# Patient Record
Sex: Female | Born: 1960
Health system: Southern US, Community
[De-identification: ages and names within clinical notes are randomized; demographics above are authoritative.]

## PROBLEM LIST (undated history)

## (undated) DIAGNOSIS — E876 Hypokalemia: Secondary | ICD-10-CM

## (undated) DIAGNOSIS — Z87448 Personal history of other diseases of urinary system: Secondary | ICD-10-CM

## (undated) DIAGNOSIS — I10 Essential (primary) hypertension: Secondary | ICD-10-CM

## (undated) DIAGNOSIS — C801 Malignant (primary) neoplasm, unspecified: Secondary | ICD-10-CM

## (undated) DIAGNOSIS — Z923 Personal history of irradiation: Secondary | ICD-10-CM

## (undated) DIAGNOSIS — R05 Cough: Secondary | ICD-10-CM

## (undated) DIAGNOSIS — K5730 Diverticulosis of large intestine without perforation or abscess without bleeding: Secondary | ICD-10-CM

## (undated) DIAGNOSIS — E119 Type 2 diabetes mellitus without complications: Secondary | ICD-10-CM

## (undated) DIAGNOSIS — E210 Primary hyperparathyroidism: Secondary | ICD-10-CM

## (undated) DIAGNOSIS — D259 Leiomyoma of uterus, unspecified: Secondary | ICD-10-CM

## (undated) DIAGNOSIS — K219 Gastro-esophageal reflux disease without esophagitis: Secondary | ICD-10-CM

## (undated) DIAGNOSIS — N183 Chronic kidney disease, stage 3 unspecified: Secondary | ICD-10-CM

## (undated) DIAGNOSIS — R1110 Vomiting, unspecified: Secondary | ICD-10-CM

## (undated) DIAGNOSIS — K429 Umbilical hernia without obstruction or gangrene: Secondary | ICD-10-CM

## (undated) DIAGNOSIS — R011 Cardiac murmur, unspecified: Secondary | ICD-10-CM

## (undated) DIAGNOSIS — D849 Immunodeficiency, unspecified: Secondary | ICD-10-CM

## (undated) DIAGNOSIS — Z9221 Personal history of antineoplastic chemotherapy: Secondary | ICD-10-CM

## (undated) DIAGNOSIS — Z940 Kidney transplant status: Secondary | ICD-10-CM

## (undated) DIAGNOSIS — N189 Chronic kidney disease, unspecified: Secondary | ICD-10-CM

## (undated) DIAGNOSIS — D219 Benign neoplasm of connective and other soft tissue, unspecified: Secondary | ICD-10-CM

## (undated) DIAGNOSIS — D649 Anemia, unspecified: Secondary | ICD-10-CM

## (undated) DIAGNOSIS — R6883 Chills (without fever): Secondary | ICD-10-CM

## (undated) DIAGNOSIS — C50919 Malignant neoplasm of unspecified site of unspecified female breast: Secondary | ICD-10-CM

## (undated) HISTORY — PX: TUBAL LIGATION: SHX77

## (undated) HISTORY — PX: BREAST BIOPSY: SHX20

## (undated) HISTORY — DX: Umbilical hernia without obstruction or gangrene: K42.9

## (undated) HISTORY — PX: COLONOSCOPY: SHX174

## (undated) HISTORY — PX: BREAST LUMPECTOMY: SHX2

## (undated) HISTORY — DX: Vomiting, unspecified: R11.10

## (undated) HISTORY — DX: Essential (primary) hypertension: I10

## (undated) HISTORY — DX: Personal history of irradiation: Z92.3

## (undated) HISTORY — DX: Cough: R05

## (undated) HISTORY — PX: DILATION AND CURETTAGE OF UTERUS: SHX78

## (undated) HISTORY — DX: Chills (without fever): R68.83

## (undated) HISTORY — PX: KIDNEY TRANSPLANT: SHX239

## (undated) HISTORY — DX: Type 2 diabetes mellitus without complications: E11.9

## (undated) HISTORY — DX: Chronic kidney disease, unspecified: N18.9

## (undated) HISTORY — PX: PARATHYROIDECTOMY: SHX19

---

## 1998-02-28 HISTORY — PX: DILATION AND CURETTAGE OF UTERUS: SHX78

## 1998-08-10 ENCOUNTER — Other Ambulatory Visit: Admission: RE | Admit: 1998-08-10 | Discharge: 1998-08-10 | Payer: Self-pay | Admitting: Obstetrics & Gynecology

## 1999-09-06 ENCOUNTER — Other Ambulatory Visit: Admission: RE | Admit: 1999-09-06 | Discharge: 1999-09-06 | Payer: Self-pay | Admitting: Obstetrics & Gynecology

## 2000-03-07 ENCOUNTER — Encounter: Payer: Self-pay | Admitting: Obstetrics & Gynecology

## 2000-03-07 ENCOUNTER — Encounter: Admission: RE | Admit: 2000-03-07 | Discharge: 2000-03-07 | Payer: Self-pay | Admitting: Obstetrics & Gynecology

## 2001-03-28 ENCOUNTER — Other Ambulatory Visit: Admission: RE | Admit: 2001-03-28 | Discharge: 2001-03-28 | Payer: Self-pay | Admitting: Obstetrics & Gynecology

## 2001-04-02 ENCOUNTER — Encounter: Payer: Self-pay | Admitting: Obstetrics & Gynecology

## 2001-04-02 ENCOUNTER — Encounter: Admission: RE | Admit: 2001-04-02 | Discharge: 2001-04-02 | Payer: Self-pay | Admitting: Obstetrics & Gynecology

## 2001-07-31 ENCOUNTER — Other Ambulatory Visit: Admission: RE | Admit: 2001-07-31 | Discharge: 2001-07-31 | Payer: Self-pay | Admitting: Obstetrics & Gynecology

## 2002-03-08 ENCOUNTER — Other Ambulatory Visit: Admission: RE | Admit: 2002-03-08 | Discharge: 2002-03-08 | Payer: Self-pay | Admitting: Obstetrics & Gynecology

## 2002-10-24 ENCOUNTER — Other Ambulatory Visit: Admission: RE | Admit: 2002-10-24 | Discharge: 2002-10-24 | Payer: Self-pay | Admitting: Obstetrics & Gynecology

## 2003-04-23 ENCOUNTER — Other Ambulatory Visit: Admission: RE | Admit: 2003-04-23 | Discharge: 2003-04-23 | Payer: Self-pay | Admitting: Obstetrics & Gynecology

## 2004-11-24 ENCOUNTER — Other Ambulatory Visit: Admission: RE | Admit: 2004-11-24 | Discharge: 2004-11-24 | Payer: Self-pay | Admitting: Obstetrics & Gynecology

## 2005-12-26 ENCOUNTER — Encounter: Admission: RE | Admit: 2005-12-26 | Discharge: 2005-12-26 | Payer: Self-pay | Admitting: Obstetrics & Gynecology

## 2006-08-21 ENCOUNTER — Ambulatory Visit (HOSPITAL_COMMUNITY): Admission: RE | Admit: 2006-08-21 | Discharge: 2006-08-21 | Payer: Self-pay | Admitting: Nephrology

## 2006-11-27 ENCOUNTER — Ambulatory Visit (HOSPITAL_COMMUNITY): Admission: RE | Admit: 2006-11-27 | Discharge: 2006-11-28 | Payer: Self-pay | Admitting: Surgery

## 2006-11-27 ENCOUNTER — Encounter (INDEPENDENT_AMBULATORY_CARE_PROVIDER_SITE_OTHER): Payer: Self-pay | Admitting: Surgery

## 2009-02-28 HISTORY — PX: RENAL BIOPSY: SHX156

## 2010-02-28 HISTORY — PX: BREAST SURGERY: SHX581

## 2010-03-20 ENCOUNTER — Encounter: Payer: Self-pay | Admitting: Obstetrics & Gynecology

## 2010-07-13 NOTE — Op Note (Signed)
NAMEDORETTA, MOCKBEE             ACCOUNT NO.:  [id number]   MEDICAL RECORD NO.:  [id number]          PATIENT TYPE:  AMB   LOCATION:  SDS                          FACILITY:  [facility]   PHYSICIAN:  Earnstine Regal, MD      DATE OF BIRTH:  07/07/1960   DATE OF PROCEDURE:  11/27/2006  DATE OF DISCHARGE:                               OPERATIVE REPORT   PREOPERATIVE DIAGNOSIS:  Primary hyperparathyroidism.   POSTOPERATIVE DIAGNOSIS:  Primary hyperparathyroidism.   PROCEDURE:  Right inferior minimally-invasive parathyroidectomy.   SURGEON:  Earnstine Regal, MD, FACS   ASSISTANT:  Sharyn Dross, R.N.F.A.   ANESTHESIA:  General.   ESTIMATED BLOOD LOSS:  Minimal.   PREPARATION:  Betadine.   COMPLICATIONS:  None.   INDICATIONS:  The patient is a 50 year old black female who is referred  by Dr. Erling Cruz with biochemical evidence of primary  hyperparathyroidism.  Sestamibi scan performed June 2008 at [facility] demonstrated persistent uptake in a right inferior position  consistent with parathyroid adenoma.  The patient now comes to surgery  for resection.   BODY OF REPORT:  Procedure was done in OR #17 at the [place]  [facility].  The patient was brought to the operating room,  placed in  a supine position on the operating room table.  Following  administration of general anesthesia, the patient is positioned and then  prepped and draped in the usual strict aseptic fashion.  After  ascertaining that an adequate level of anesthesia had been obtained, a  right inferior neck incision was made with a #10 blade.  Dissection was  carried down through subcutaneous tissues and hemostasis obtained with  the electrocautery.  Platysma is divided with the electrocautery.  Flaps  are developed cephalad and caudad.  A Weitlaner retractor is placed for  exposure.  Strap muscles are incised in the midline and reflected  laterally.  Inferior pole of the right lobe of the  thyroid is  identified.  Dissection in the tracheoesophageal groove just below the  right lobe of the thyroid reveals an abnormally enlarged parathyroid  gland.  This is gently dissected out.  Vascular tributaries are divided  between small and medium Ligaclips.  Gland is completely excised.  It  measures approximately 2 cm in length.  It is submitted to pathology,  where frozen section by Dr. Ardine Eng confirms parathyroid tissue  consistent with parathyroid adenoma.  Good hemostasis is noted in the  neck.  Surgicel is placed in the operative field.  Strap muscles are  reapproximated in the midline with interrupted 3-0 Vicryl sutures.  Platysma is closed with interrupted 3-0 Vicryl sutures.  Skin is  anesthetized with local anesthetic.  Skin is closed with a running 4-0  Vicryl  subcuticular suture.  Wound is washed and dried and Benzoin and Steri-  Strips are applied.  Sterile dressings are applied.  The patient is  awakened from anesthesia and brought to the recovery room in stable  condition.  The patient tolerated the procedure well.      Earnstine Regal, MD  Electronically Signed     TMG/MEDQ  D:  11/27/2006  T:  11/27/2006  Job:  [id number]   cc:   Darrold Span. Florene Glen, M.D.  Ashby Dawes. Polite, M.D.  Sharlet Salina, M.D.

## 2010-12-09 LAB — BASIC METABOLIC PANEL
BUN: 17
CO2: 26
Chloride: 109
Glucose, Bld: 101 — ABNORMAL HIGH
Potassium: 4
Sodium: 141

## 2010-12-09 LAB — URINALYSIS, ROUTINE W REFLEX MICROSCOPIC
Bilirubin Urine: NEGATIVE
Glucose, UA: NEGATIVE
Protein, ur: 100 — AB
Urobilinogen, UA: 1

## 2010-12-09 LAB — DIFFERENTIAL
Basophils Absolute: 0.1
Eosinophils Absolute: 0.2
Eosinophils Relative: 1
Monocytes Absolute: 1.1 — ABNORMAL HIGH

## 2010-12-09 LAB — CBC
HCT: 42.8
Hemoglobin: 14
MCHC: 32.7
MCV: 84
Platelets: 193
RDW: 14.1 — ABNORMAL HIGH

## 2010-12-09 LAB — URINE MICROSCOPIC-ADD ON

## 2010-12-09 LAB — PROTIME-INR: Prothrombin Time: 12.3

## 2011-11-02 ENCOUNTER — Encounter (INDEPENDENT_AMBULATORY_CARE_PROVIDER_SITE_OTHER): Payer: Self-pay

## 2011-11-10 ENCOUNTER — Ambulatory Visit (INDEPENDENT_AMBULATORY_CARE_PROVIDER_SITE_OTHER): Payer: Self-pay | Admitting: Surgery

## 2011-11-23 ENCOUNTER — Ambulatory Visit (INDEPENDENT_AMBULATORY_CARE_PROVIDER_SITE_OTHER): Payer: Medicaid Other | Admitting: Surgery

## 2011-11-23 ENCOUNTER — Encounter (INDEPENDENT_AMBULATORY_CARE_PROVIDER_SITE_OTHER): Payer: Self-pay | Admitting: Surgery

## 2011-11-23 VITALS — BP 126/84 | HR 72 | Temp 97.6°F | Resp 16 | Ht 65.0 in | Wt 158.2 lb

## 2011-11-23 DIAGNOSIS — D259 Leiomyoma of uterus, unspecified: Secondary | ICD-10-CM | POA: Insufficient documentation

## 2011-11-23 DIAGNOSIS — N185 Chronic kidney disease, stage 5: Secondary | ICD-10-CM

## 2011-11-23 DIAGNOSIS — N051 Unspecified nephritic syndrome with focal and segmental glomerular lesions: Secondary | ICD-10-CM | POA: Insufficient documentation

## 2011-11-23 DIAGNOSIS — I10 Essential (primary) hypertension: Secondary | ICD-10-CM | POA: Insufficient documentation

## 2011-11-23 NOTE — Progress Notes (Signed)
Subjective:     Patient ID: Erika Freeman, female   DOB: [birth year], 51 y.o.   MRN: [id number]  HPI  Erika Freeman  [birth year] [id number]  Patient Care Team: [name], MD as Consulting Physician (Nephrology) [name], MD as Consulting Physician (Obstetrics and Gynecology)  This patient is a 51 y.o.female who presents today for surgical evaluation at the request of Dr Florene Glen.   Reason for visit: Consideration of peritoneal dialysis  Pleasant active female.  Has FSGS and progressed to severe kidney disease.  Need for dialysis imminent.  Sent to me for surgical evaluation.  She comes today with her husband.  They have done the CAPD training courses.  She favors peritoneal dialysis over hemodialysis.  She's only had a C-section.  She tells me she's had fibroids followed by Dr. Milta Deiters with gynecology in the past.  Not followed up with him due to insurance issues but that's recently corrected.  They are hoping that she will be a kidney transplant candidate soon.  Patient Active Problem List  Diagnosis  . Chronic kidney disease, stage V (very severe)  . Hypertension  . FSGS (focal segmental glomerulosclerosis)  . Uterine fibromyoma    Past Medical History  Diagnosis Date  . Hypertension   . Chronic kidney disease   . Chills   . Cough   . Vomiting     Past Surgical History  Procedure Date  . Cesarean section 1996  . Parathyroidectomy 2000 - approximate  . Renal biopsy 2011  . Breast surgery 2012    left - fibroadenoma    History   Social History  . Marital Status: Married    Spouse Name: N/A    Number of Children: N/A  . Years of Education: N/A   Occupational History  . Not on file.   Social History Main Topics  . Smoking status: Never Smoker   . Smokeless tobacco: Never Used  . Alcohol Use: No  . Drug Use: No  . Sexually Active: Not on file   Other Topics Concern  . Not on file   Social History Narrative  . No narrative on file    Family  History  Problem Relation Age of Onset  . Cancer Maternal Uncle     lung  . Cancer Cousin     breast  . Cancer Cousin     breast    Current Outpatient Prescriptions  Medication Sig Dispense Refill  . amLODipine (NORVASC) 10 MG tablet Take 10 mg by mouth 2 (two) times daily.      [name] aspirin 81 MG tablet Take 81 mg by mouth daily.      [name] METOPROLOL TARTRATE PO Take 100 mg by mouth 2 (two) times daily.      . paricalcitol (ZEMPLAR) 1 MCG capsule Take 1 mcg by mouth daily.      . simvastatin (ZOCOR) 20 MG tablet Take 20 mg by mouth daily.         No Known Allergies  BP 126/84  Pulse 72  Temp 97.6 F (36.4 C) (Temporal)  Resp 16  Ht 5\' 5"  (1.651 m)  Wt 158 lb 4 oz (71.782 kg)  BMI 26.33 kg/m2  No results found.   Review of Systems  Constitutional: Negative for fever, chills, diaphoresis, appetite change and fatigue.  HENT: Negative for ear pain, sore throat, trouble swallowing, neck pain and ear discharge.   Eyes: Negative for photophobia, discharge and visual disturbance.  Respiratory: Negative for cough,  choking, chest tightness and shortness of breath.   Cardiovascular: Negative for chest pain and palpitations.       Patient walks for about 4 miles without difficulty.  No exertional chest/neck/shoulder/arm pain.   Gastrointestinal: Negative for nausea, vomiting, abdominal pain, diarrhea, constipation, blood in stool, abdominal distention, anal bleeding and rectal pain.  Genitourinary: Positive for decreased urine volume. Negative for dysuria, frequency, hematuria, flank pain, vaginal bleeding, vaginal discharge, enuresis, difficulty urinating and vaginal pain.  Musculoskeletal: Negative for myalgias and gait problem.  Skin: Negative for color change, pallor and rash.  Neurological: Negative for dizziness, speech difficulty, weakness and numbness.  Hematological: Negative for adenopathy.  Psychiatric/Behavioral: Negative for confusion and agitation. The patient is not  nervous/anxious.        Objective:   Physical Exam  Constitutional: She is oriented to person, place, and time. She appears well-developed and well-nourished. No distress.  HENT:  Head: Normocephalic.  Mouth/Throat: Oropharynx is clear and moist. No oropharyngeal exudate.  Eyes: Conjunctivae normal and EOM are normal. Pupils are equal, round, and reactive to light. No scleral icterus.  Neck: Normal range of motion. Neck supple. No tracheal deviation present.  Cardiovascular: Normal rate, regular rhythm and intact distal pulses.   Pulmonary/Chest: Effort normal and breath sounds normal. No respiratory distress. She exhibits no tenderness.  Abdominal: Soft. She exhibits no distension and no mass. There is no tenderness. There is no CVA tenderness. No hernia. Hernia confirmed negative in the right inguinal area and confirmed negative in the left inguinal area.    Genitourinary: No vaginal discharge found.  Musculoskeletal: Normal range of motion. She exhibits no tenderness.  Lymphadenopathy:    She has no cervical adenopathy.       Right: No inguinal adenopathy present.       Left: No inguinal adenopathy present.  Neurological: She is alert and oriented to person, place, and time. No cranial nerve deficit. She exhibits normal muscle tone. Coordination normal.  Skin: Skin is warm and dry. No rash noted. She is not diaphoretic. No erythema.  Psychiatric: She has a normal mood and affect. Her behavior is normal. Judgment and thought content normal.       Assessment:     CKD 5, progressing to ESRD, need for dialysis, reasonablwe CAPD pt    Plan:     I think she is a good candidate for peritoneal dialysis catheter placement.  Even with her uterine fibroids and prior C-section, hopefully it will not pose many adhesions for an issue.  Plan possible omentopexy as well.  I plan to place on the right side since the patient is left-handed.  She her husband and had training in feel comfortable  with proceeding.  I did caution them that most renal failure patients need hemodialysis at some point a peritoneal dialysis this not work or as a backup option.  I will defer to their discussion with the nephrologist if they wish to have that pursued as a backup as well.  She does not want any shunts or fistulas at this time.  The anatomy & physiology of peritoneum was discussed.  Natural history risks without surgery of worsening renal failure was discussed.   I feel the risks of no intervention will lead to serious problems that outweigh the operative risks; therefore, I recommended placement of a peritoneal dialysis catheter.  I explained laparoscopic techniques with possible need for an open approach.    Risks such as bleeding, infection, abscess, injury to other organs, catheter occlusion  or malpositioning, reoperation to remove/reposition the catheter, heart attack, death, and other risks were discussed.   I noted a good likelihood this will help address the problem.  Possibility that this will not be enough to compensate for the renal failure & need for further treatment such as hemodialysis was explained.  Goals of post-operative recovery were discussed as well.  We will work to minimize complications.   The patient is/will be getting training on catheter use by dialysis nursing before and after surgery.  I stressed the importance of meticulous care & sterile technique to prevent catheter problems.  Questions were answered.  The patient expresses understanding & wishes to proceed with surgery.

## 2011-11-23 NOTE — Patient Instructions (Addendum)
See the Handout(s) we gave you.  Consider surgery.  Please call our office at [phone] if you wish to schedule surgery or if you have further questions / concerns.    Peritoneal Dialysis Dialysis can be done using a machine outside of the body (hemodialysis). Or, it can be done inside the body (peritoneal dialysis). The word "peritoneal" refers to the lining or membrane of the belly (abdominal cavity). The peritoneal membrane is a thin, plastic-like lining inside the belly that covers the organs and fits in the abdominal or peritoneal cavity, such as the stomach, liver and the kidneys. This lining works like a filter. It will allow certain things to pass from your blood through the lining and into a special solution that has been placed into your belly. In this type of dialysis, the peritoneum is used to help clean the blood.  If you need dialysis, your kidneys are not working right. Healthy kidneys take out extra water and waste products, which becomes urine. When the kidneys do not do this, serious problems can develop. The waste and water build up in the blood. Your hands and feet might swell. You may feel tired, weak or sick to your stomach. Also, your blood pressure may rise. If not treated, you could die. Dialysis is a treatment that does the work that your kidneys would do if they were healthy.  It cleans your blood.   It will make sure your body has the right amount of certain chemicals that it needs. They include potassium, sodium and bicarbonate.   It will help control your blood pressure.  UNDERSTANDING PERITONEAL DIALYSIS  Here is how peritoneal dialysis works:   First, you will have surgery to put a soft plastic tube (catheter) into your belly (abdomen). This will allow you to easily connect yourself to special tubing, which will then let a special dialysis solution to be placed into your abdomen.   For each treatment, you will need at least one bag of dialysis solution (a  liquid called dialysate). It is a mix of water that is pure and free of germs (sterile), sugar (dextrose) and the nutrients and minerals found in your blood. Sometimes, more than one bag is needed to get the right amount of fluid for your abdomen. Your caregiver will explain what size and how many bags you will need.   The dialysate is slowly put through the catheter to fill the abdomen (called the peritoneal cavity). This dialysate will need to stay in your body for 3-4 hours. This is known as the dwell time.   The solution is working to clean the blood and remove wastes from your body. At the end of this time, the solution is drained from your body through tubing into an empty bag. It is then replaced with a fresh dialysate.   The draining and replacing of the dialysate is called an exchange or cycle. The catheter is capped after each exchange. Once the solution is in your body, you are then free to do whatever you would like until the next exchange. Most people will need to do 4-5 exchanges each day.   There are two different methods that can be used.   Continuous ambulatory peritoneal dialysis (CAPD): You put the solution into your abdomen, cap your catheter and then go about your day. Several hours later, you reconnect to a tubing set up, drain out the solution and then put more solution in. This is done several times a day. No machine is  needed.   Continuous cycler-assisted peritoneal dialysis (CCPD): A machine is used, which fills the abdomen with dialysate and then drains it. This happens several times. It usually is done at night while you are sleeping. When you wake up, you can disconnect from the machine and are free to go to go about your day.  PREPARING FOR EXCHANGES  Discuss the details of the procedure with your caregivers. You will be working with a nurse who is specially trained in doing dialysis. Make sure you understand:   How to do an exchange.   How much solution you need.    What type of solution you will need.   How often you should do an exchange. Ask:   How many times each day?   When? At meals? At bedtime?   Always keep the dialysate bags and other supplies in a cool, clean and dry place.   Keeping everything clean is very important.   The catheter and its cap must be free from germs (sterile)   The adapter also must be sterile. It attaches the dialysis bag and tubing to the catheter.   Clean the area of your body around the catheter every day. Use a chemical that fights infection (antiseptic).   Wash your hands thoroughly before starting an exchange.   You may be taught to wear a mask to cover your nose and mouth. This makes infection less likely to happen.   You may be taught to close doors, windows and turn off any fans before doing an exchange.   Check the dialysate bag very carefully.   Make sure it is the right size bag for you. This information is on the label.   Also, make sure it is the right mixture. For some people, the dialysate contents vary. For instance, the mixture might be a stronger solution for overnight.   Check the expiration date (the last date you can use the bag). It also is on the label. If the date has gone by, throw away the bag.   The solution should be clear. You should be able to see any writing on the side of the bag clearly through the solution. Do not use a cloudy solution.   Gently squeeze the bag to make sure there are no leaks.   Use a dry heating pad to warm the dialysate in the bag. Leave the cover on the bag while you do this.   This is for comfort. You can skip this step if you want.   Never place the bag of solution under warm or hot water. Water from a faucet is not sterile and could cause germs to get into the bag. Infection could then result.  PERFORMING AN EXCHANGE  For continuous ambulatory dialysis:   Attach the dialysis bag and tubing to your catheter. Hang the bag so that gravity (the  natural downward pull) draws the solution down and into your abdomen once the clamps are opened. This should take about 10 minutes.   Remove the bag and tubing from the catheter. Cap the catheter.   The solution stays in the abdomen for 3-4 hours (dwell time). The solution is working to clean the blood and remove wastes from your body.   When you are ready to drain the solution for another exchange, take the cap off the catheter. Then, attach the catheter to tubing, which is attached to an empty bag. Place this empty bag below the abdomen or on the floor or stool and undo  the clamps.   Gravity helps pull the fluid out of the abdomen and into the bag. The fluid in the bag may look yellow and clear, like urine. It usually takes about 20 minutes to drain the fluid out of the abdomen.   When the solution has drained, start the process again by infusing a new bag of dialysate and then capping the catheter.   This should continue until you have used all of the solution that you are to use each day.   Sometimes, a small machine is used overnight. It is called a mini-cycler. This is done if the body cannot go all night without an exchange. The machine lets you sleep without having to get up and do an exchange.   For continuous cycler-assisted dialysis:   You will be taught how to set up or program your machine.   When you are ready for bed, put the dialysate bags onto the cycler machine. Put on exactly the number of bags that your caregiver said to use.   Connect your catheter to the machine and turn the cycler machine on.   Overnight, the cycler will do several exchanges. It often does three to five, sometimes more.   Solution that is in your abdomen in the morning will stay during the day. The machine is set to make the daytime solution stronger, if that is needed.   In the morning, you will disconnect from the machine and cap your catheter and go about your day.   Sometimes, an extra  exchange is done during the day. This may be needed to remove excess waste or fluid.  IMPORTANT REMINDERS  You will need to follow a very strict schedule. Every step of the dialysis procedure must be done every day. Sometimes, several times a day. Altogether, this might take an extra 2 hours or more. However, you must stick to the routine. Do not skip a day. Do not skip a procedure.   Some people find it helpful to work with a Social worker or Education officer, museum in addition to the renal (kidney) nurse. They can help you figure out how to change your daily routine to fit in the dialysis sessions.   You may need to change your diet. Ask your caregiver for advice, or talk with a nutritionist about what you should and should not eat.   You will need to weigh yourself every day and keep track of what your weight is.   You may be taught how to check your blood pressure before every exchange. Your blood pressure reading will help determine what type of solution to use. If your blood pressure is too high, you may need a stronger solution.  RISKS AND COMPLICATIONS  Possible problems vary, depending on the method you use. Your overall health also can have an effect. Problems that could develop because of dialysis include:  Infection. This is the most common problem. It could occur:   In the peritoneum. This is called peritonitis.   Around the catheter.   Weight gain. The dialysate contains a type of sugar known as dextrose. Dextrose has a lot of calories. The body takes in several hundred calories from this sugar each day.   Weakened muscles in the abdomen. This can result from all of the fluid that your body has to hold in the abdomen.   Catheter replacement. Sometimes, a new one has to be put in.   Change in dialysis method. Due to some complications, you may need to change  to hemodialysis for a short time and have your dialysis done at a center.   Trouble adjusting to your new lifestyle. In some people,  this leads to depression.   Sleep problems.   Dialysis-related amyloidosis. This sometimes occurs after 5 years of dialysis. Protein builds up in the blood. This can cause painful deposits on bones, joints and tendons (which connect muscle to bone). Or, it can cause hollow spots in bones that make them more likely to break.   Excess fluid. Your body may absorb too much of the fluid that is held in the abdomen. This can lead to heart or lung problems.  SEEK MEDICAL CARE IF:   You have any problems with an exchange.   The area around the catheter becomes red or painful.   The catheter seems loose, or it feels like it is coming out.   A bag of dialysate looks cloudy. Or, the liquid is an unusual color.   Abdominal pain or discomfort.   You feel sick to your stomach (nauseous) or throw up (vomit).   You develop a fever of more than 102 F (38.9 C).  SEEK IMMEDIATE MEDICAL CARE IF:  You develop a fever of more than 102 F (38.9 C). Document Released: 12/12/2008 Document Revised: 02/03/2011 Document Reviewed: 12/12/2008 [facility] Patient Information 2012 [employer].

## 2011-12-26 ENCOUNTER — Encounter (HOSPITAL_COMMUNITY): Payer: Self-pay

## 2011-12-31 NOTE — Pre-Procedure Instructions (Signed)
[facility]   12/31/2011   Your procedure is scheduled on: Tuesday, November 12th   Report to [facility] at  5:30 AM.   Call this number if you have problems the morning of surgery: [phone]   Remember:   Do not eat food OR drink any liquids:After Midnight on Monday.   Take these medicines the morning of surgery with A SIP OF WATER: Norvasc, Metoprolol   Do not wear jewelry, make-up or nail polish.  Do not wear lotions, powders, or perfumes. You may NOT wear deodorant.  Ladies---Do not shave 48 hours prior to surgery.    Do not bring valuables to the hospital.  Contacts, dentures or bridgework may not be worn into surgery.   Leave suitcase in the car. After surgery it may be brought to your room.  For patients admitted to the hospital, checkout time is 11:00 AM the day of discharge.   Patients discharged the day of surgery will not be allowed to drive home. And if you do go              Home, someone will need to stay with you for the first 24 hrs.   Name and phone number of your driver:    Special Instructions: Shower using CHG 2 nights before surgery and the night before surgery.  If you shower the day of surgery use CHG.  Use special wash - you have one bottle of CHG for all showers.  You should use approximately 1/3 of the bottle for each shower.   Please read over the following fact sheets that you were given: Pain Booklet, Coughing and Deep Breathing, MRSA Information and Surgical Site Infection Prevention

## 2012-01-02 ENCOUNTER — Encounter (HOSPITAL_COMMUNITY): Payer: Self-pay

## 2012-01-02 ENCOUNTER — Encounter (HOSPITAL_COMMUNITY)
Admission: RE | Admit: 2012-01-02 | Discharge: 2012-01-02 | Disposition: A | Discharge status: Home / Self Care | Payer: Medicaid Other | Source: Ambulatory Visit | Attending: Surgery | Admitting: Surgery

## 2012-01-02 HISTORY — DX: Anemia, unspecified: D64.9

## 2012-01-02 LAB — SURGICAL PCR SCREEN: Staphylococcus aureus: NEGATIVE

## 2012-01-02 LAB — COMPREHENSIVE METABOLIC PANEL
Alkaline Phosphatase: 42 U/L (ref 39–117)
BUN: 49 mg/dL — ABNORMAL HIGH (ref 6–23)
Chloride: 106 mEq/L (ref 96–112)
GFR calc Af Amer: 8 mL/min — ABNORMAL LOW (ref >90)
GFR calc non Af Amer: 7 mL/min — ABNORMAL LOW (ref >90)
Glucose, Bld: 99 mg/dL (ref 70–99)
Potassium: 4.1 mEq/L (ref 3.5–5.1)
Total Bilirubin: 0.2 mg/dL — ABNORMAL LOW (ref 0.3–1.2)
Total Protein: 7.6 g/dL (ref 6.0–8.3)

## 2012-01-02 LAB — CBC
HCT: 38.1 % (ref 36.0–46.0)
Hemoglobin: 12.5 g/dL (ref 12.0–15.0)
MCHC: 32.8 g/dL (ref 30.0–36.0)

## 2012-01-03 NOTE — Consult Note (Signed)
Anesthesia chart review: Patient is a 51 year old female scheduled for laparoscopic exploration of the abdomen and placement of continues amateur a peritoneal dialysis catheter, omentopexy on 01/10/2012 by Dr. Johney Maine.  Nephrologist is Dr. Florene Glen.  History includes chronic kidney disease stage V, hypertension, focal segmental glomerulosclerosis, uterine fibromyoma, hypertension, parathyroidectomy, left breast surgery for fibroadenoma, prior C-section, non-smoker.  EKG on 01/02/2012 showed normal sinus rhythm, nonspecific T wave abnormality. It was not felt significantly changed from her EKG from 11/24/2006 (see Muse).  Chest x-ray on 01/02/2012 showed no acute disease.  Labs noted.  Her BUN/Cr is 49/6.66.  WBC 11.9.  I reviewed Dr. Abel Presto office note from 10/24/11 (under Media tab) that mentions her Cr in June 2013 was 6.10.  This prompted referral for PD catheter placement.  Her Cr is elevated at 6.66, but was also in the 6 range in June of this year, so overall appears stable.  I'll order an ISTAT to be done on arrival.    [name], [place]

## 2012-01-09 MED ORDER — CEFAZOLIN SODIUM-DEXTROSE 2-3 GM-% IV SOLR
2.0000 g | INTRAVENOUS | Status: AC
  Administered 2012-01-10: 2 g via INTRAVENOUS
  Filled 2012-01-09: qty 50

## 2012-01-10 ENCOUNTER — Encounter (HOSPITAL_COMMUNITY): Payer: Self-pay | Admitting: Vascular Surgery

## 2012-01-10 ENCOUNTER — Ambulatory Visit (HOSPITAL_COMMUNITY)
Admission: RE | Admit: 2012-01-10 | Discharge: 2012-01-10 | Disposition: A | Discharge status: Home / Self Care | Payer: Medicaid Other | Source: Ambulatory Visit | Attending: Surgery | Admitting: Surgery

## 2012-01-10 ENCOUNTER — Encounter (HOSPITAL_COMMUNITY): Payer: Self-pay | Admitting: *Deleted

## 2012-01-10 ENCOUNTER — Encounter (HOSPITAL_COMMUNITY)
Admission: RE | Disposition: A | Discharge status: Home / Self Care | Payer: Self-pay | Source: Ambulatory Visit | Attending: Surgery

## 2012-01-10 ENCOUNTER — Ambulatory Visit (HOSPITAL_COMMUNITY): Payer: Medicaid Other | Admitting: Vascular Surgery

## 2012-01-10 DIAGNOSIS — Z7982 Long term (current) use of aspirin: Secondary | ICD-10-CM | POA: Insufficient documentation

## 2012-01-10 DIAGNOSIS — I120 Hypertensive chronic kidney disease with stage 5 chronic kidney disease or end stage renal disease: Secondary | ICD-10-CM | POA: Insufficient documentation

## 2012-01-10 DIAGNOSIS — K429 Umbilical hernia without obstruction or gangrene: Secondary | ICD-10-CM | POA: Insufficient documentation

## 2012-01-10 DIAGNOSIS — Z01812 Encounter for preprocedural laboratory examination: Secondary | ICD-10-CM | POA: Insufficient documentation

## 2012-01-10 DIAGNOSIS — N185 Chronic kidney disease, stage 5: Secondary | ICD-10-CM

## 2012-01-10 DIAGNOSIS — N059 Unspecified nephritic syndrome with unspecified morphologic changes: Secondary | ICD-10-CM | POA: Insufficient documentation

## 2012-01-10 DIAGNOSIS — D259 Leiomyoma of uterus, unspecified: Secondary | ICD-10-CM | POA: Insufficient documentation

## 2012-01-10 DIAGNOSIS — Z79899 Other long term (current) drug therapy: Secondary | ICD-10-CM | POA: Insufficient documentation

## 2012-01-10 DIAGNOSIS — Z01810 Encounter for preprocedural cardiovascular examination: Secondary | ICD-10-CM | POA: Insufficient documentation

## 2012-01-10 HISTORY — PX: UMBILICAL HERNIA REPAIR: SHX196

## 2012-01-10 HISTORY — DX: Benign neoplasm of connective and other soft tissue, unspecified: D21.9

## 2012-01-10 HISTORY — PX: CAPD INSERTION: SHX5233

## 2012-01-10 LAB — POCT I-STAT 4, (NA,K, GLUC, HGB,HCT)
Glucose, Bld: 90 mg/dL (ref 70–99)
Potassium: 4.1 mEq/L (ref 3.5–5.1)
Sodium: 145 mEq/L (ref 135–145)

## 2012-01-10 SURGERY — LAPAROSCOPIC INSERTION CONTINUOUS AMBULATORY PERITONEAL DIALYSIS  (CAPD) CATHETER
Anesthesia: General | Site: Abdomen | Wound class: Clean

## 2012-01-10 MED ORDER — HYDROMORPHONE HCL PF 1 MG/ML IJ SOLN
0.2500 mg | INTRAMUSCULAR | Status: DC | PRN
  Administered 2012-01-10 (×3): 0.5 mg via INTRAVENOUS

## 2012-01-10 MED ORDER — LACTATED RINGERS IV SOLN
INTRAVENOUS | Status: DC | PRN
  Administered 2012-01-10: 08:00:00 via INTRAVENOUS

## 2012-01-10 MED ORDER — ACETAMINOPHEN 325 MG PO TABS
650.0000 mg | ORAL_TABLET | ORAL | Status: DC | PRN
  Filled 2012-01-10: qty 2

## 2012-01-10 MED ORDER — BUPIVACAINE-EPINEPHRINE PF 0.25-1:200000 % IJ SOLN
INTRAMUSCULAR | Status: AC
  Filled 2012-01-10: qty 30

## 2012-01-10 MED ORDER — NEOSTIGMINE METHYLSULFATE 1 MG/ML IJ SOLN
INTRAMUSCULAR | Status: DC | PRN
  Administered 2012-01-10: 3 mg via INTRAVENOUS

## 2012-01-10 MED ORDER — BUPIVACAINE-EPINEPHRINE 0.25% -1:200000 IJ SOLN
INTRAMUSCULAR | Status: DC | PRN
  Administered 2012-01-10: 30 mL

## 2012-01-10 MED ORDER — SODIUM CHLORIDE 0.9 % IR SOLN
Status: DC | PRN
  Administered 2012-01-10: 10:00:00

## 2012-01-10 MED ORDER — EPHEDRINE SULFATE 50 MG/ML IJ SOLN
INTRAMUSCULAR | Status: DC | PRN
  Administered 2012-01-10 (×3): 10 mg via INTRAVENOUS

## 2012-01-10 MED ORDER — SODIUM CHLORIDE 0.9 % IV SOLN: 250.0000 mL | INTRAVENOUS | Status: DC | PRN

## 2012-01-10 MED ORDER — LIDOCAINE HCL (CARDIAC) 20 MG/ML IV SOLN
INTRAVENOUS | Status: DC | PRN
  Administered 2012-01-10: 60 mg via INTRAVENOUS

## 2012-01-10 MED ORDER — FENTANYL CITRATE 0.05 MG/ML IJ SOLN: 25.0000 ug | INTRAMUSCULAR | Status: DC | PRN

## 2012-01-10 MED ORDER — ONDANSETRON HCL 4 MG/2ML IJ SOLN
4.0000 mg | Freq: Four times a day (QID) | INTRAMUSCULAR | Status: DC | PRN
  Filled 2012-01-10: qty 2

## 2012-01-10 MED ORDER — FENTANYL CITRATE 0.05 MG/ML IJ SOLN
INTRAMUSCULAR | Status: DC | PRN
  Administered 2012-01-10: 100 ug via INTRAVENOUS

## 2012-01-10 MED ORDER — SODIUM CHLORIDE 0.9 % IJ SOLN: 3.0000 mL | Freq: Two times a day (BID) | INTRAMUSCULAR | Status: DC

## 2012-01-10 MED ORDER — HYDROMORPHONE HCL PF 1 MG/ML IJ SOLN
INTRAMUSCULAR | Status: AC
  Filled 2012-01-10: qty 1

## 2012-01-10 MED ORDER — ONDANSETRON HCL 4 MG/2ML IJ SOLN
4.0000 mg | Freq: Once | INTRAMUSCULAR | Status: AC | PRN
  Administered 2012-01-10: 4 mg via INTRAVENOUS

## 2012-01-10 MED ORDER — CHLORHEXIDINE GLUCONATE 4 % EX LIQD: 1.0000 "application " | Freq: Once | CUTANEOUS | Status: DC

## 2012-01-10 MED ORDER — ONDANSETRON HCL 4 MG/2ML IJ SOLN
INTRAMUSCULAR | Status: AC
  Administered 2012-01-10: 4 mg via INTRAVENOUS
  Filled 2012-01-10: qty 2

## 2012-01-10 MED ORDER — HYDROMORPHONE HCL PF 1 MG/ML IJ SOLN
INTRAMUSCULAR | Status: AC
  Administered 2012-01-10: 0.5 mg via INTRAVENOUS
  Filled 2012-01-10: qty 1

## 2012-01-10 MED ORDER — SODIUM CHLORIDE 0.9 % IR SOLN
Status: DC | PRN
  Administered 2012-01-10 (×2): 1

## 2012-01-10 MED ORDER — ACETAMINOPHEN 10 MG/ML IV SOLN
1000.0000 mg | Freq: Once | INTRAVENOUS | Status: AC | PRN
  Administered 2012-01-10: 1000 mg via INTRAVENOUS

## 2012-01-10 MED ORDER — ROCURONIUM BROMIDE 100 MG/10ML IV SOLN
INTRAVENOUS | Status: DC | PRN
  Administered 2012-01-10: 30 mg via INTRAVENOUS
  Administered 2012-01-10: 10 mg via INTRAVENOUS

## 2012-01-10 MED ORDER — OXYCODONE HCL 5 MG PO TABS: 5.0000 mg | ORAL_TABLET | Freq: Four times a day (QID) | ORAL | Status: DC | PRN

## 2012-01-10 MED ORDER — ONDANSETRON HCL 4 MG/2ML IJ SOLN
INTRAMUSCULAR | Status: DC | PRN
  Administered 2012-01-10: 4 mg via INTRAVENOUS

## 2012-01-10 MED ORDER — SODIUM CHLORIDE 0.9 % IJ SOLN: 3.0000 mL | INTRAMUSCULAR | Status: DC | PRN

## 2012-01-10 MED ORDER — MIDAZOLAM HCL 5 MG/5ML IJ SOLN
INTRAMUSCULAR | Status: DC | PRN
  Administered 2012-01-10: 2 mg via INTRAVENOUS

## 2012-01-10 MED ORDER — PROPOFOL 10 MG/ML IV BOLUS
INTRAVENOUS | Status: DC | PRN
  Administered 2012-01-10: 170 mg via INTRAVENOUS

## 2012-01-10 MED ORDER — ACETAMINOPHEN 10 MG/ML IV SOLN
INTRAVENOUS | Status: AC
  Filled 2012-01-10: qty 100

## 2012-01-10 MED ORDER — OXYCODONE HCL 5 MG PO TABS: 5.0000 mg | ORAL_TABLET | ORAL | Status: DC | PRN

## 2012-01-10 MED ORDER — GLYCOPYRROLATE 0.2 MG/ML IJ SOLN
INTRAMUSCULAR | Status: DC | PRN
  Administered 2012-01-10: 0.4 mg via INTRAVENOUS

## 2012-01-10 MED ORDER — ACETAMINOPHEN 650 MG RE SUPP
650.0000 mg | RECTAL | Status: DC | PRN
  Filled 2012-01-10: qty 1

## 2012-01-10 SURGICAL SUPPLY — 51 items
ADAPTER CATH SAFE LCK II CO PK (MISCELLANEOUS) ×1 IMPLANT
ADAPTER SAFE LOCK II CO PACK (MISCELLANEOUS) ×1
BAG DECANTER FOR FLEXI CONT (MISCELLANEOUS) ×2 IMPLANT
BLADE SURG ROTATE 9660 (MISCELLANEOUS) ×2 IMPLANT
CANISTER SUCTION 2500CC (MISCELLANEOUS) IMPLANT
CATH MONCRIEF POPOVICH (CATHETERS) IMPLANT
CHLORAPREP W/TINT 26ML (MISCELLANEOUS) ×2 IMPLANT
CLOTH BEACON ORANGE TIMEOUT ST (SAFETY) ×2 IMPLANT
COIL SWAN NECK LT (MISCELLANEOUS) IMPLANT
COIL SWAN NECK RT (MISCELLANEOUS) ×2 IMPLANT
COVER SURGICAL LIGHT HANDLE (MISCELLANEOUS) ×2 IMPLANT
DECANTER SPIKE VIAL GLASS SM (MISCELLANEOUS) ×2 IMPLANT
DISSECTOR BLUNT TIP ENDO 5MM (MISCELLANEOUS) IMPLANT
DRAPE WARM FLUID 44X44 (DRAPE) ×2 IMPLANT
DRSG OPSITE 4X5.5 SM (GAUZE/BANDAGES/DRESSINGS) IMPLANT
DRSG OPSITE 6X11 MED (GAUZE/BANDAGES/DRESSINGS) ×2 IMPLANT
DRSG PAD ABDOMINAL 8X10 ST (GAUZE/BANDAGES/DRESSINGS) IMPLANT
DRSG TEGADERM 4X4.75 (GAUZE/BANDAGES/DRESSINGS) ×4 IMPLANT
ELECT REM PT RETURN 9FT ADLT (ELECTROSURGICAL) ×2
ELECTRODE REM PT RTRN 9FT ADLT (ELECTROSURGICAL) ×1 IMPLANT
GAUZE SPONGE 2X2 8PLY STRL LF (GAUZE/BANDAGES/DRESSINGS) ×1 IMPLANT
GLOVE BIOGEL PI IND STRL 7.0 (GLOVE) ×1 IMPLANT
GLOVE BIOGEL PI IND STRL 7.5 (GLOVE) ×2 IMPLANT
GLOVE BIOGEL PI IND STRL 8 (GLOVE) ×1 IMPLANT
GLOVE BIOGEL PI INDICATOR 7.0 (GLOVE) ×1
GLOVE BIOGEL PI INDICATOR 7.5 (GLOVE) ×2
GLOVE BIOGEL PI INDICATOR 8 (GLOVE) ×1
GLOVE ECLIPSE 7.5 STRL STRAW (GLOVE) ×2 IMPLANT
GLOVE ECLIPSE 8.0 STRL XLNG CF (GLOVE) ×2 IMPLANT
GOWN PREVENTION PLUS XLARGE (GOWN DISPOSABLE) ×2 IMPLANT
GOWN STRL NON-REIN LRG LVL3 (GOWN DISPOSABLE) ×6 IMPLANT
KIT BASIN OR (CUSTOM PROCEDURE TRAY) ×2 IMPLANT
KIT ROOM TURNOVER OR (KITS) ×2 IMPLANT
NEEDLE 22X1 1/2 (OR ONLY) (NEEDLE) ×2 IMPLANT
NS IRRIG 1000ML POUR BTL (IV SOLUTION) ×4 IMPLANT
PAD ARMBOARD 7.5X6 YLW CONV (MISCELLANEOUS) ×4 IMPLANT
SET EXTENSION TUBING 8  CATH (SET/KITS/TRAYS/PACK) ×2 IMPLANT
SET IRRIG TUBING LAPAROSCOPIC (IRRIGATION / IRRIGATOR) IMPLANT
SLEEVE ENDOPATH XCEL 5M (ENDOMECHANICALS) ×4 IMPLANT
SPONGE GAUZE 2X2 STER 10/PKG (GAUZE/BANDAGES/DRESSINGS) ×1
SPONGE GAUZE 4X4 12PLY (GAUZE/BANDAGES/DRESSINGS) ×2 IMPLANT
SUT MNCRL AB 4-0 PS2 18 (SUTURE) ×2 IMPLANT
SUT PROLENE 2 0 CT2 30 (SUTURE) ×4 IMPLANT
SUT PROLENE 2 0 SH 30 (SUTURE) ×4 IMPLANT
SUT VICRYL 0 UR6 27IN ABS (SUTURE) ×2 IMPLANT
TOWEL OR 17X24 6PK STRL BLUE (TOWEL DISPOSABLE) ×2 IMPLANT
TOWEL OR 17X26 10 PK STRL BLUE (TOWEL DISPOSABLE) ×2 IMPLANT
TRAY LAPAROSCOPIC (CUSTOM PROCEDURE TRAY) ×2 IMPLANT
TROCAR FALLER TUNNELING (TROCAR) ×2 IMPLANT
TROCAR XCEL NON-BLD 11X100MML (ENDOMECHANICALS) ×2 IMPLANT
TROCAR XCEL NON-BLD 5MMX100MML (ENDOMECHANICALS) ×2 IMPLANT

## 2012-01-10 NOTE — Progress Notes (Signed)
Report given to kay rn as caregiver 

## 2012-01-10 NOTE — Progress Notes (Signed)
Pt feeling  A little nauseous... Chair reclined  Feet elevated .   Given po fluids .... Feels better at this time.

## 2012-01-10 NOTE — Op Note (Addendum)
01/10/2012  9:55 AM  PATIENT:  Erika Freeman  51 y.o. female  Patient Care Team: [name], MD as Consulting Physician (Nephrology) [name], MD as Consulting Physician (Obstetrics and Gynecology)  PRE-OPERATIVE DIAGNOSIS:  CHRONIC KIDNEY DISEASE - STAGE 5; NEED FOR DIALYSIS  POST-OPERATIVE DIAGNOSIS:    CHRONIC KIDNEY DISEASE - STAGE 5; NEED FOR DIALYSIS UMBILICAL HERNIA FIBROID UTERUS  PROCEDURE:  Procedure(s): LAPAROSCOPIC INSERTION CONTINUOUS AMBULATORY PERITONEAL DIALYSIS  (CAPD) CATHETER HERNIA REPAIR UMBILICAL ADULT (Primary) Laparoscopic omentopexy  SURGEON:  Surgeon(s): Adin Hector, MD  ASSISTANT: none   ANESTHESIA:   local and general  EBL:  Total I/O In: 400 [I.V.:400] Out: -   Delay start of Pharmacological VTE agent (>24hrs) due to surgical blood loss or risk of bleeding:  no  DRAINS: CAPD CATHETER:  [place] 62.5cm Right Curl Cath in R suprapubic area.   SPECIMEN:  No Specimen  DISPOSITION OF SPECIMEN:  N/A  COUNTS:  YES  PLAN OF CARE: Discharge to home after PACU  PATIENT DISPOSITION:  PACU - hemodynamically stable.  INDICATION:   Pleasant left-handed woman with progressive kidney disease.  Desires peritoneal dialysis.  Hoping for kidney transplant.  Has had peritoneal dialysis catheter training.  I offered surgery:  The anatomy & physiology of peritoneum was discussed.  Natural history risks without surgery of worsening renal failure was discussed.   I feel the risks of no intervention will lead to serious problems that outweigh the operative risks; therefore, I recommended placement of a peritoneal dialysis catheter.  I explained laparoscopic techniques with possible need for an open approach.    Risks such as bleeding, infection, abscess, injury to other organs, catheter occlusion or malpositioning, reoperation to remove/reposition the catheter, heart attack, death, and other risks were discussed.   I noted a good likelihood  this will help address the problem.  Possibility that this will not be enough to compensate for the renal failure & need for further treatment such as hemodialysis was explained.  Goals of post-operative recovery were discussed as well.  We will work to minimize complications.   The patient is/will be getting training on catheter use by dialysis nursing before and after surgery.  I stressed the importance of meticulous care & sterile technique to prevent catheter problems.  Questions were answered.  The patient expresses understanding & wishes to proceed with surgery.   OR FINDINGS: No adhesions.  Omentopexy bilateral upper quadrants.  A large uterus.  About four times normal.  Catheter is in posterior cul-de-sac between the uterus and anterior rectum.  Flushes and aspirates well.  It is a right [place] curl catheter  DESCRIPTION:   Informed consent was confirmed.  The patient underwent general anaesthesia without difficulty.  The patient was positioned appropriately.  VTE prevention in place.  The patient's abdomen was clipped, prepped, & draped in a sterile fashion.  Surgical timeout confirmed our plan.  The patient was positioned in reverse Trendelenburg.  Abdominal entry was gained using optical entry technique in the left upper abdomen.  Entry was clean.  I induced carbon dioxide insufflation.  Camera inspection revealed no injury.  Extra ports were carefully placed under direct laparoscopic visualization.  I tunneled a 46mm dilating port through the abdominal wall from the rightt suprapubic skin incision to exit into the peritoneum just cephalad to the dome of the bladder.  I mobilized the greater omentum it into the upper abdomen.  I performed omentopexy with 2-0 prolene interrupted stitches to the omental  edge in bilateral anterior upper quadrants x2.  This kept the omentum from reaching into the infraumbilical abdomen  I placed the CAPD catheter through the 19mm port.  The curl of the CAPD  catheter rested down in the cul de sac of the deep pelvis, posterior to the large uterus.  I tacked the catheter to the posterior peritoneum of the right salpignooverian pedicle gently with a 2-0 prolene stitch acting as a gentle sling to keep the catheter from flipping back up out of the pelvis.  The deep cuff rests in the preperitoneal space.  I noted a a 1 cm umbilical hernia through the stalk.  I desufflated the abdomen.  I made a curvilinear incision around her bellybutton.  I freed the umbilical stalk off the fascia.  I primarily closed the fascia transversely using 0 Vicryl interrupted stitches.  Camera inspection noted a knuckle of jejunal mesentery up in the repair.  I cut out all stitches.  I inspected the bowel - it looked fine.  No evidence of any bowel injury or leak.  No serosal injury.  I reclosed with the abdomen insufflated.  Camera inspection and noted good closure and no involvement of other organs.    Hemostasis was excellent.  I tunnelled the catheter inferiorly in the abdominal wall to the right suprapubic region below the patient's beltline.  The catheter flushed and aspirated easily. I did a final flush of heparinized saline into the CAPD catheter.  I removed the ports.  I closed the skin using 4-0 monocryl stitch.  Sterile dressings were applied. The patient was extubated & arrived in the PACU in stable condition..  I had discussed postoperative care with the patient in the holding area. I am about to locate the patient's family and discuss operative findings and postoperative goals / instructions.  Instructions are written in the chart as well.

## 2012-01-10 NOTE — H&P (Signed)
HPI  Erika Freeman  02-12-1961  AM:[id number]  Patient Care Team:  [name], MD as Consulting Physician (Nephrology)  [name], MD as Consulting Physician (Obstetrics and Gynecology)  This patient is a 51 y.o.female who presents today for surgical evaluation at the request of Dr Florene Glen.   Reason for visit: Consideration of peritoneal dialysis   Pleasant active female. Has FSGS and progressed to severe kidney disease. Need for dialysis imminent. Sent to me for surgical evaluation. She comes today with her husband. They have done the CAPD training courses. She favors peritoneal dialysis over hemodialysis. She's only had a C-section. She tells me she's had fibroids followed by Dr. Nori Riis with gynecology in the past. Not followed up with him due to insurance issues but that's recently corrected. They are hoping that she will be a kidney transplant candidate soon.  No new events   Patient Active Problem List   Diagnosis   .  Chronic kidney disease, stage V (very severe)   .  Hypertension   .  FSGS (focal segmental glomerulosclerosis)   .  Uterine fibromyoma    Past Medical History   Diagnosis  Date   .  Hypertension    .  Chronic kidney disease    .  Chills    .  Cough    .  Vomiting     Past Surgical History   Procedure  Date   .  Cesarean section  1996   .  Parathyroidectomy  2000 - approximate   .  Renal biopsy  2011   .  Breast surgery  2012     left - fibroadenoma    History    Social History   .  Marital Status:  Married     Spouse Name:  N/A     Number of Children:  N/A   .  Years of Education:  N/A    Occupational History   .  Not on file.    Social History Main Topics   .  Smoking status:  Never Smoker   .  Smokeless tobacco:  Never Used   .  Alcohol Use:  No   .  Drug Use:  No   .  Sexually Active:  Not on file    Other Topics  Concern   .  Not on file    Social History Narrative   .  No narrative on file    Family History   Problem   Relation  Age of Onset   .  Cancer  Maternal Uncle       lung    .  Cancer  Cousin       breast    .  Cancer  Cousin       breast    Current Outpatient Prescriptions   Medication  Sig  Dispense  Refill   .  amLODipine (NORVASC) 10 MG tablet  Take 10 mg by mouth 2 (two) times daily.     [name]  aspirin 81 MG tablet  Take 81 mg by mouth daily.     [name]  METOPROLOL TARTRATE PO  Take 100 mg by mouth 2 (two) times daily.     .  paricalcitol (ZEMPLAR) 1 MCG capsule  Take 1 mcg by mouth daily.     .  simvastatin (ZOCOR) 20 MG tablet  Take 20 mg by mouth daily.     No Known Allergies  BP 126/84  Pulse 72  Temp  97.6 F (36.4 C) (Temporal)  Resp 16  Ht 5\' 5"  (1.651 m)  Wt 158 lb 4 oz (71.782 kg)  BMI 26.33 kg/m2  No results found.  Review of Systems  Constitutional: Negative for fever, chills, diaphoresis, appetite change and fatigue.  HENT: Negative for ear pain, sore throat, trouble swallowing, neck pain and ear discharge.  Eyes: Negative for photophobia, discharge and visual disturbance.  Respiratory: Negative for cough, choking, chest tightness and shortness of breath.  Cardiovascular: Negative for chest pain and palpitations.  Patient walks for about 4 miles without difficulty. No exertional chest/neck/shoulder/arm pain.  Gastrointestinal: Negative for nausea, vomiting, abdominal pain, diarrhea, constipation, blood in stool, abdominal distention, anal bleeding and rectal pain.  Genitourinary: Positive for decreased urine volume. Negative for dysuria, frequency, hematuria, flank pain, vaginal bleeding, vaginal discharge, enuresis, difficulty urinating and vaginal pain.  Musculoskeletal: Negative for myalgias and gait problem.  Skin: Negative for color change, pallor and rash.  Neurological: Negative for dizziness, speech difficulty, weakness and numbness.  Hematological: Negative for adenopathy.  Psychiatric/Behavioral: Negative for confusion and agitation. The patient is not  nervous/anxious.  Objective:   Physical Exam  Constitutional: She is oriented to person, place, and time. She appears well-developed and well-nourished. No distress.  HENT:  Head: Normocephalic.  Mouth/Throat: Oropharynx is clear and moist. No oropharyngeal exudate.  Eyes: Conjunctivae normal and EOM are normal. Pupils are equal, round, and reactive to light. No scleral icterus.  Neck: Normal range of motion. Neck supple. No tracheal deviation present.  Cardiovascular: Normal rate, regular rhythm and intact distal pulses.  Pulmonary/Chest: Effort normal and breath sounds normal. No respiratory distress. She exhibits no tenderness.  Abdominal: Soft. She exhibits no distension and no mass. There is no tenderness. There is no CVA tenderness. No hernia. Hernia confirmed negative in the right inguinal area and confirmed negative in the left inguinal area.    Genitourinary: No vaginal discharge found.  Musculoskeletal: Normal range of motion. She exhibits no tenderness.  Lymphadenopathy:  She has no cervical adenopathy.  Right: No inguinal adenopathy present.  Left: No inguinal adenopathy present.  Neurological: She is alert and oriented to person, place, and time. No cranial nerve deficit. She exhibits normal muscle tone. Coordination normal.  Skin: Skin is warm and dry. No rash noted. She is not diaphoretic. No erythema.  Psychiatric: She has a normal mood and affect. Her behavior is normal. Judgment and thought content normal.   Assessment:   CKD 5, progressing to ESRD, need for dialysis, reasonable for CAPD Plan:   I think she is a good candidate for peritoneal dialysis catheter placement. Even with her uterine fibroids and prior C-section, hopefully it will not pose many adhesions for an issue. Plan possible omentopexy as well.  I plan to place on the right side since the patient is left-handed. She her husband and had training in feel comfortable with proceeding. I did caution them that  most renal failure patients need hemodialysis at some point in case the peritoneal dialysis is not working or as a backup option. I will defer to their discussion with the nephrologist if they wish to have that pursued as a backup as well. She does not want any shunts or fistulas at this time.   The anatomy & physiology of peritoneum was discussed. Natural history risks without surgery of worsening renal failure was discussed. I feel the risks of no intervention will lead to serious problems that outweigh the operative risks; therefore, I recommended placement of a  peritoneal dialysis catheter. I explained laparoscopic techniques with possible need for an open approach.  Risks such as bleeding, infection, abscess, injury to other organs, catheter occlusion or malpositioning, reoperation to remove/reposition the catheter, heart attack, death, and other risks were discussed. I noted a good likelihood this will help address the problem. Possibility that this will not be enough to compensate for the renal failure & need for further treatment such as hemodialysis was explained. Goals of post-operative recovery were discussed as well. We will work to minimize complications.  The patient is/will be getting training on catheter use by dialysis nursing before and after surgery. I stressed the importance of meticulous care & sterile technique to prevent catheter problems. Questions were answered. The patient expresses understanding & wishes to proceed with surgery.   I have re-reviewed the the patient's records, history, medications, and allergies.  I have re-examined the patient.  I again discussed intraoperative plans and goals of post-operative recovery.  The patient agrees to proceed.

## 2012-01-10 NOTE — Preoperative (Signed)
Beta Blockers   Reason not to administer Beta Blockers:Not Applicable 

## 2012-01-10 NOTE — Transfer of Care (Signed)
Immediate Anesthesia Transfer of Care Note  Patient: Erika Freeman  Procedure(s) Performed: Procedure(s) (LRB) with comments: LAPAROSCOPIC INSERTION CONTINUOUS AMBULATORY PERITONEAL DIALYSIS  (CAPD) CATHETER (N/A) - LAPAROSCOPIC CAPD CATHETER PLACEMENT OMENTOPEXY HERNIA REPAIR UMBILICAL ADULT (N/A)  Patient Location: PACU  Anesthesia Type:General  Level of Consciousness: awake, alert  and oriented  Airway & Oxygen Therapy: Patient Spontanous Breathing and Patient connected to nasal cannula oxygen  Post-op Assessment: Report given to PACU RN, Post -op Vital signs reviewed and stable and Patient moving all extremities  Post vital signs: Reviewed and stable  Complications: No apparent anesthesia complications

## 2012-01-10 NOTE — Anesthesia Procedure Notes (Signed)
Procedure Name: Intubation Date/Time: 01/10/2012 8:30 AM Performed by: Kyung Rudd Pre-anesthesia Checklist: Patient identified, Emergency Drugs available, Suction available, Patient being monitored and Timeout performed Patient Re-evaluated:Patient Re-evaluated prior to inductionOxygen Delivery Method: Circle system utilized Preoxygenation: Pre-oxygenation with 100% oxygen Intubation Type: IV induction Ventilation: Mask ventilation without difficulty Laryngoscope Size: Mac and 3 Grade View: Grade II Tube type: Oral Tube size: 7.0 mm Number of attempts: 1 Airway Equipment and Method: Stylet Placement Confirmation: ETT inserted through vocal cords under direct vision,  positive ETCO2 and breath sounds checked- equal and bilateral Secured at: 21 cm Tube secured with: Tape Dental Injury: Teeth and Oropharynx as per pre-operative assessment

## 2012-01-10 NOTE — Anesthesia Postprocedure Evaluation (Signed)
  Anesthesia Post-op Note  Patient: Erika Freeman  Procedure(s) Performed: Procedure(s) (LRB) with comments: LAPAROSCOPIC INSERTION CONTINUOUS AMBULATORY PERITONEAL DIALYSIS  (CAPD) CATHETER (N/A) - LAPAROSCOPIC CAPD CATHETER PLACEMENT OMENTOPEXY HERNIA REPAIR UMBILICAL ADULT (N/A)  Patient Location: PACU  Anesthesia Type:General  Level of Consciousness: awake, alert  and oriented  Airway and Oxygen Therapy: Patient Spontanous Breathing and Patient connected to nasal cannula oxygen  Post-op Pain: mild  Post-op Assessment: Post-op Vital signs reviewed and Patient's Cardiovascular Status Stable  Post-op Vital Signs: stable  Complications: No apparent anesthesia complications

## 2012-01-10 NOTE — Anesthesia Preprocedure Evaluation (Addendum)
Anesthesia Evaluation  Patient identified by MRN, date of birth, ID band Patient awake    Reviewed: Allergy & Precautions, H&P , NPO status , Patient's Chart, lab work & pertinent test results  Airway Mallampati: II TM Distance: >3 FB Neck ROM: Full    Dental  (+) Dental Advisory Given and Teeth Intact   Pulmonary  breath sounds clear to auscultation        Cardiovascular Rhythm:Regular Rate:Normal     Neuro/Psych    GI/Hepatic   Endo/Other    Renal/GU      Musculoskeletal   Abdominal   Peds  Hematology   Anesthesia Other Findings   Reproductive/Obstetrics                          Anesthesia Physical Anesthesia Plan  ASA: III  Anesthesia Plan: General   Post-op Pain Management:    Induction: Intravenous  Airway Management Planned: Oral ETT  Additional Equipment:   Intra-op Plan:   Post-operative Plan: Extubation in OR  Informed Consent: I have reviewed the patients History and Physical, chart, labs and discussed the procedure including the risks, benefits and alternatives for the proposed anesthesia with the patient or authorized representative who has indicated his/her understanding and acceptance.   Dental advisory given  Plan Discussed with: CRNA and Surgeon  Anesthesia Plan Comments: (ESRD has not started dialysis K-4.1 Htn H/O FSGN  Plan GA with oral ETT  Roberts Gaudy, MD)        Anesthesia Quick Evaluation

## 2012-01-11 ENCOUNTER — Telehealth (INDEPENDENT_AMBULATORY_CARE_PROVIDER_SITE_OTHER): Payer: Self-pay | Admitting: General Surgery

## 2012-01-11 NOTE — Progress Notes (Signed)
Pt states she fells like she is not able to empty  Bladder voids small amts often and feels urge to void always... Pt given tele number for Dr Johney Maine and urged to call now  So she can go to office if needed .

## 2012-01-11 NOTE — Telephone Encounter (Signed)
Patient cannot urinate, set up for evaluation with [facility].

## 2012-01-11 NOTE — Telephone Encounter (Signed)
Called patient back. She states she is urinating better than she was this morning. She did not want to be referred to a urologist yet. She will call us if things do not continue to get better. Made her aware I would call and check on her tomorrow morning.

## 2012-01-11 NOTE — Telephone Encounter (Signed)
Patient called in stating that she was one day out from CAPD cath placement by Dr.Gross on 01/10/12.Erika KitchenMarland Kitchenpatient stated that she was having trouble emptying her bladder...called back to the pod to get advice from Dr.Gross because he was in the office and instructed the patient to take warm showers or tub soaks to get the urination started or at referral to urology for and in and out catheter...advised patient of his instructions in which she declined and stated that she has had this trouble before after surgery and that she wanted to wait and just drink water and cranberry juice...she would call us back later if needed

## 2012-01-12 ENCOUNTER — Encounter (HOSPITAL_COMMUNITY): Payer: Self-pay | Admitting: Surgery

## 2012-01-12 ENCOUNTER — Telehealth (INDEPENDENT_AMBULATORY_CARE_PROVIDER_SITE_OTHER): Payer: Self-pay | Admitting: General Surgery

## 2012-01-12 NOTE — Telephone Encounter (Signed)
Called to check on patient after issues with voiding yesterday. She states she took at stool softener last night and was able to have a bowel movement and now she is urinating fine. She feels like she is completely emptying and having no further problems. She will call if anything changes and keep follow up on 01/24/12.

## 2012-01-24 ENCOUNTER — Encounter (INDEPENDENT_AMBULATORY_CARE_PROVIDER_SITE_OTHER): Payer: Self-pay | Admitting: Surgery

## 2012-01-24 ENCOUNTER — Ambulatory Visit (INDEPENDENT_AMBULATORY_CARE_PROVIDER_SITE_OTHER): Payer: Medicaid Other | Admitting: Surgery

## 2012-01-24 VITALS — BP 110/68 | HR 60 | Resp 14 | Ht 65.0 in | Wt 156.0 lb

## 2012-01-24 DIAGNOSIS — K5909 Other constipation: Secondary | ICD-10-CM | POA: Insufficient documentation

## 2012-01-24 DIAGNOSIS — K5900 Constipation, unspecified: Secondary | ICD-10-CM

## 2012-01-24 DIAGNOSIS — N185 Chronic kidney disease, stage 5: Secondary | ICD-10-CM

## 2012-01-24 DIAGNOSIS — K429 Umbilical hernia without obstruction or gangrene: Secondary | ICD-10-CM | POA: Insufficient documentation

## 2012-01-24 NOTE — Patient Instructions (Addendum)
GETTING TO GOOD BOWEL HEALTH. Irregular bowel habits such as constipation and diarrhea can lead to many problems over time.  Having one soft bowel movement a day is the most important way to prevent further problems.  The anorectal canal is designed to handle stretching and feces to safely manage our ability to get rid of solid waste (feces, poop, stool) out of our body.  BUT, hard constipated stools can act like ripping concrete bricks and diarrhea can be a burning fire to this very sensitive area of our body, causing inflamed hemorrhoids, anal fissures, increasing risk is perirectal abscesses, abdominal pain/bloating, an making irritable bowel worse.     The goal: ONE SOFT BOWEL MOVEMENT A DAY!  To have soft, regular bowel movements:    Drink at least 8 tall glasses of water a day.     Take plenty of fiber.  Fiber is the undigested part of plant food that passes into the colon, acting s "natures broom" to encourage bowel motility and movement.  Fiber can absorb and hold large amounts of water. This results in a larger, bulkier stool, which is soft and easier to pass. Work gradually over several weeks up to 6 servings a day of fiber (25g a day even more if needed) in the form of: o Vegetables -- Root (potatoes, carrots, turnips), leafy green (lettuce, salad greens, celery, spinach), or cooked high residue (cabbage, broccoli, etc) o Fruit -- Fresh (unpeeled skin & pulp), Dried (prunes, apricots, cherries, etc ),  or stewed ( applesauce)  o Whole grain breads, pasta, etc (whole wheat)  o Bran cereals    Bulking Agents -- This type of water-retaining fiber generally is easily obtained each day by one of the following:  o Psyllium bran -- The psyllium plant is remarkable because its ground seeds can retain so much water. This product is available as Metamucil, Konsyl, Effersyllium, Per Diem Fiber, or the less expensive generic preparation in drug and health food stores. Although labeled a laxative, it really  is not a laxative.  o Methylcellulose -- This is another fiber derived from wood which also retains water. It is available as Citrucel. o Polyethylene Glycol - and "artificial" fiber commonly called Miralax or Glycolax.  It is helpful for people with gassy or bloated feelings with regular fiber o Flax Seed - a less gassy fiber than psyllium   No reading or other relaxing activity while on the toilet. If bowel movements take longer than 5 minutes, you are too constipated   AVOID CONSTIPATION.  High fiber and water intake usually takes care of this.  Sometimes a laxative is needed to stimulate more frequent bowel movements, but    Laxatives are not a good long-term solution as it can wear the colon out. o Osmotics (Milk of Magnesia, Fleets phosphosoda, Magnesium citrate, MiraLax, GoLytely) are safer than  o Stimulants (Senokot, Castor Oil, Dulcolax, Ex Lax)    o Do not take laxatives for more than 7days in a row.    IF SEVERELY CONSTIPATED, try a Bowel Retraining Program: o Do not use laxatives.  o Eat a diet high in roughage, such as bran cereals and leafy vegetables.  o Drink six (6) ounces of prune or apricot juice each morning.  o Eat two (2) large servings of stewed fruit each day.  o Take one (1) heaping tablespoon of a psyllium-based bulking agent twice a day. Use sugar-free sweetener when possible to avoid excessive calories.  o Eat a normal breakfast.  o   Set aside 15 minutes after breakfast to sit on the toilet, but do not strain to have a bowel movement.  o If you do not have a bowel movement by the third day, use an enema and repeat the above steps.    Controlling diarrhea o Switch to liquids and simpler foods for a few days to avoid stressing your intestines further. o Avoid dairy products (especially milk & ice cream) for a short time.  The intestines often can lose the ability to digest lactose when stressed. o Avoid foods that cause gassiness or bloating.  Typical foods include  beans and other legumes, cabbage, broccoli, and dairy foods.  Every person has some sensitivity to other foods, so listen to our body and avoid those foods that trigger problems for you. o Adding fiber (Citrucel, Metamucil, psyllium, Miralax) gradually can help thicken stools by absorbing excess fluid and retrain the intestines to act more normally.  Slowly increase the dose over a few weeks.  Too much fiber too soon can backfire and cause cramping & bloating. o Probiotics (such as active yogurt, Align, etc) may help repopulate the intestines and colon with normal bacteria and calm down a sensitive digestive tract.  Most studies show it to be of mild help, though, and such products can be costly. o Medicines:   Bismuth subsalicylate (ex. Kayopectate, Pepto Bismol) every 30 minutes for up to 6 doses can help control diarrhea.  Avoid if pregnant.   Loperamide (Immodium) can slow down diarrhea.  Start with two tablets (4mg  total) first and then try one tablet every 6 hours.  Avoid if you are having fevers or severe pain.  If you are not better or start feeling worse, stop all medicines and call your doctor for advice o Call your doctor if you are getting worse or not better.  Sometimes further testing (cultures, endoscopy, X-ray studies, bloodwork, etc) may be needed to help diagnose and treat the cause of the diarrhea.  Peritoneal Dialysis Dialysis can be done using a machine outside of the body (hemodialysis). Or, it can be done inside the body (peritoneal dialysis). The word "peritoneal" refers to the lining or membrane of the belly (abdominal cavity). The peritoneal membrane is a thin, plastic-like lining inside the belly that covers the organs and fits in the abdominal or peritoneal cavity, such as the stomach, liver and the kidneys. This lining works like a filter. It will allow certain things to pass from your blood through the lining and into a special solution that has been placed into your belly. In  this type of dialysis, the peritoneum is used to help clean the blood.  If you need dialysis, your kidneys are not working right. Healthy kidneys take out extra water and waste products, which becomes urine. When the kidneys do not do this, serious problems can develop. The waste and water build up in the blood. Your hands and feet might swell. You may feel tired, weak or sick to your stomach. Also, your blood pressure may rise. If not treated, you could die. Dialysis is a treatment that does the work that your kidneys would do if they were healthy.  It cleans your blood.  It will make sure your body has the right amount of certain chemicals that it needs. They include potassium, sodium and bicarbonate.  It will help control your blood pressure. UNDERSTANDING PERITONEAL DIALYSIS  Here is how peritoneal dialysis works:  First, you will have surgery to put a soft plastic tube (catheter)  into your belly (abdomen). This will allow you to easily connect yourself to special tubing, which will then let a special dialysis solution to be placed into your abdomen.  For each treatment, you will need at least one bag of dialysis solution (a liquid called dialysate). It is a mix of water that is pure and free of germs (sterile), sugar (dextrose) and the nutrients and minerals found in your blood. Sometimes, more than one bag is needed to get the right amount of fluid for your abdomen. Your caregiver will explain what size and how many bags you will need.  The dialysate is slowly put through the catheter to fill the abdomen (called the peritoneal cavity). This dialysate will need to stay in your body for 3-4 hours. This is known as the dwell time.  The solution is working to clean the blood and remove wastes from your body. At the end of this time, the solution is drained from your body through tubing into an empty bag. It is then replaced with a fresh dialysate.  The draining and replacing of the dialysate is  called an exchange or cycle. The catheter is capped after each exchange. Once the solution is in your body, you are then free to do whatever you would like until the next exchange. Most people will need to do 4-5 exchanges each day.  There are two different methods that can be used.  Continuous ambulatory peritoneal dialysis (CAPD): You put the solution into your abdomen, cap your catheter and then go about your day. Several hours later, you reconnect to a tubing set up, drain out the solution and then put more solution in. This is done several times a day. No machine is needed.  Continuous cycler-assisted peritoneal dialysis (CCPD): A machine is used, which fills the abdomen with dialysate and then drains it. This happens several times. It usually is done at night while you are sleeping. When you wake up, you can disconnect from the machine and are free to go to go about your day. PREPARING FOR EXCHANGES  Discuss the details of the procedure with your caregivers. You will be working with a nurse who is specially trained in doing dialysis. Make sure you understand:  How to do an exchange.  How much solution you need.  What type of solution you will need.  How often you should do an exchange. Ask:  How many times each day?  When? At meals? At bedtime?  Always keep the dialysate bags and other supplies in a cool, clean and dry place.  Keeping everything clean is very important.  The catheter and its cap must be free from germs (sterile)  The adapter also must be sterile. It attaches the dialysis bag and tubing to the catheter.  Clean the area of your body around the catheter every day. Use a chemical that fights infection (antiseptic).  Wash your hands thoroughly before starting an exchange.  You may be taught to wear a mask to cover your nose and mouth. This makes infection less likely to happen.  You may be taught to close doors, windows and turn off any fans before doing an  exchange.  Check the dialysate bag very carefully.  Make sure it is the right size bag for you. This information is on the label.  Also, make sure it is the right mixture. For some people, the dialysate contents vary. For instance, the mixture might be a stronger solution for overnight.  Check the expiration date (the  last date you can use the bag). It also is on the label. If the date has gone by, throw away the bag.  The solution should be clear. You should be able to see any writing on the side of the bag clearly through the solution. Do not use a cloudy solution.  Gently squeeze the bag to make sure there are no leaks.  Use a dry heating pad to warm the dialysate in the bag. Leave the cover on the bag while you do this.  This is for comfort. You can skip this step if you want.  Never place the bag of solution under warm or hot water. Water from a faucet is not sterile and could cause germs to get into the bag. Infection could then result. PERFORMING AN EXCHANGE  For continuous ambulatory dialysis:  Attach the dialysis bag and tubing to your catheter. Hang the bag so that gravity (the natural downward pull) draws the solution down and into your abdomen once the clamps are opened. This should take about 10 minutes.  Remove the bag and tubing from the catheter. Cap the catheter.  The solution stays in the abdomen for 3-4 hours (dwell time). The solution is working to clean the blood and remove wastes from your body.  When you are ready to drain the solution for another exchange, take the cap off the catheter. Then, attach the catheter to tubing, which is attached to an empty bag. Place this empty bag below the abdomen or on the floor or stool and undo the clamps.  Gravity helps pull the fluid out of the abdomen and into the bag. The fluid in the bag may look yellow and clear, like urine. It usually takes about 20 minutes to drain the fluid out of the abdomen.  When the solution has  drained, start the process again by infusing a new bag of dialysate and then capping the catheter.  This should continue until you have used all of the solution that you are to use each day.  Sometimes, a small machine is used overnight. It is called a mini-cycler. This is done if the body cannot go all night without an exchange. The machine lets you sleep without having to get up and do an exchange.  For continuous cycler-assisted dialysis:  You will be taught how to set up or program your machine.  When you are ready for bed, put the dialysate bags onto the cycler machine. Put on exactly the number of bags that your caregiver said to use.  Connect your catheter to the machine and turn the cycler machine on.  Overnight, the cycler will do several exchanges. It often does three to five, sometimes more.  Solution that is in your abdomen in the morning will stay during the day. The machine is set to make the daytime solution stronger, if that is needed.  In the morning, you will disconnect from the machine and cap your catheter and go about your day.  Sometimes, an extra exchange is done during the day. This may be needed to remove excess waste or fluid. IMPORTANT REMINDERS  You will need to follow a very strict schedule. Every step of the dialysis procedure must be done every day. Sometimes, several times a day. Altogether, this might take an extra 2 hours or more. However, you must stick to the routine. Do not skip a day. Do not skip a procedure.  Some people find it helpful to work with a Social worker or Education officer, museum  in addition to the renal (kidney) nurse. They can help you figure out how to change your daily routine to fit in the dialysis sessions.  You may need to change your diet. Ask your caregiver for advice, or talk with a nutritionist about what you should and should not eat.  You will need to weigh yourself every day and keep track of what your weight is.  You may be taught how  to check your blood pressure before every exchange. Your blood pressure reading will help determine what type of solution to use. If your blood pressure is too high, you may need a stronger solution. RISKS AND COMPLICATIONS  Possible problems vary, depending on the method you use. Your overall health also can have an effect. Problems that could develop because of dialysis include:  Infection. This is the most common problem. It could occur:  In the peritoneum. This is called peritonitis.  Around the catheter.  Weight gain. The dialysate contains a type of sugar known as dextrose. Dextrose has a lot of calories. The body takes in several hundred calories from this sugar each day.  Weakened muscles in the abdomen. This can result from all of the fluid that your body has to hold in the abdomen.  Catheter replacement. Sometimes, a new one has to be put in.  Change in dialysis method. Due to some complications, you may need to change to hemodialysis for a short time and have your dialysis done at a center.  Trouble adjusting to your new lifestyle. In some people, this leads to depression.  Sleep problems.  Dialysis-related amyloidosis. This sometimes occurs after 5 years of dialysis. Protein builds up in the blood. This can cause painful deposits on bones, joints and tendons (which connect muscle to bone). Or, it can cause hollow spots in bones that make them more likely to break.  Excess fluid. Your body may absorb too much of the fluid that is held in the abdomen. This can lead to heart or lung problems. SEEK MEDICAL CARE IF:   You have any problems with an exchange.  The area around the catheter becomes red or painful.  The catheter seems loose, or it feels like it is coming out.  A bag of dialysate looks cloudy. Or, the liquid is an unusual color.  Abdominal pain or discomfort.  You feel sick to your stomach (nauseous) or throw up (vomit).  You develop a fever of more than 102  F (38.9 C). SEEK IMMEDIATE MEDICAL CARE IF:  You develop a fever of more than 102 F (38.9 C). Document Released: 12/12/2008 Document Revised: 05/09/2011 Document Reviewed: 12/12/2008 [facility] Patient Information 2013 [place].

## 2012-01-24 NOTE — Progress Notes (Signed)
Subjective:     Patient ID: Erika Freeman, female   DOB: 1960/09/28, 51 y.o.   MRN: [id number]  HPI  Erika Freeman  06-28-1960 [id number]  Patient Care Team: [name], MD as Consulting Physician (Nephrology) [name], MD as Consulting Physician (Obstetrics and Gynecology)  This patient is a 51 y.o.female who presents today for surgical evaluation s/p surgery 01/10/2012:   PROCEDURE: Procedure(s):  LAPAROSCOPIC INSERTION CONTINUOUS AMBULATORY PERITONEAL DIALYSIS (CAPD) CATHETER  HERNIA REPAIR UMBILICAL ADULT (Primary)  Laparoscopic omentopexy   The patient comes in today with her husband.  She is feeling fine=.  Minimal soreness.  Off all pain meds.  Doing moderate activity.  Still struggling with constipation.  Uses Benefiber.  Hoping to continue laxatives instead.  Getting flushes with her peritoneal dialysis catheter   Patient Active Problem List  Diagnosis  . Chronic kidney disease, stage V (very severe)  . Hypertension  . FSGS (focal segmental glomerulosclerosis)  . Uterine fibromyoma    Past Medical History  Diagnosis Date  . Chronic kidney disease   . Chills   . Cough   . Vomiting   . Hypertension     DX  30 YRS AGO  . Anemia   . Fibroid tumor     Past Surgical History  Procedure Date  . Cesarean section 1996  . Parathyroidectomy 2000 - approximate  . Renal biopsy 2011  . Breast surgery 2012    left - fibroadenoma  . Capd insertion 01/10/2012    Procedure: LAPAROSCOPIC INSERTION CONTINUOUS AMBULATORY PERITONEAL DIALYSIS  (CAPD) CATHETER;  Surgeon: Adin Hector, MD;  Location: [place];  Service: General;  Laterality: N/A;  LAPAROSCOPIC CAPD CATHETER PLACEMENT OMENTOPEXY  . Umbilical hernia repair [id number]    Procedure: HERNIA REPAIR UMBILICAL ADULT;  Surgeon: Adin Hector, MD;  Location: [facility];  Service: General;  Laterality: N/A;    History   Social History  . Marital Status: Married    Spouse Name: N/A    Number of Children:  N/A  . Years of Education: N/A   Occupational History  . Not on file.   Social History Main Topics  . Smoking status: Never Smoker   . Smokeless tobacco: Never Used  . Alcohol Use: No  . Drug Use: No  . Sexually Active: Not on file   Other Topics Concern  . Not on file   Social History Narrative  . No narrative on file    Family History  Problem Relation Age of Onset  . Cancer Maternal Uncle     lung  . Cancer Cousin     breast  . Cancer Cousin     breast    Current Outpatient Prescriptions  Medication Sig Dispense Refill  . amLODipine (NORVASC) 10 MG tablet Take 10 mg by mouth 2 (two) times daily.      [name] aspirin 81 MG tablet Take 81 mg by mouth daily.      Mariane Baumgarten Calcium (STOOL SOFTENER PO) Take by mouth.      . IRON PO Take 1 tablet by mouth every other day.       . metoprolol (LOPRESSOR) 100 MG tablet Take 100 mg by mouth 2 (two) times daily.      . simvastatin (ZOCOR) 20 MG tablet Take 20 mg by mouth daily.         No Known Allergies  BP 110/68  Pulse 60  Resp 14  Ht 5\' 5"  (1.651 m)  Wt  156 lb (70.761 kg)  BMI 25.96 kg/m2  LMP 12/31/2011  Dg Chest 2 View  01/02/2012  *RADIOLOGY REPORT*  Clinical Data: Cough, hypertension  CHEST - 2 VIEW  Comparison: 11/24/2006  Findings:Lungs clear.  Heart size and pulmonary vascularity normal. No effusion.  Visualized bones unremarkable.  IMPRESSION: No acute disease   Original Report Authenticated By: D. Wallace Going, MD      Review of Systems  Constitutional: Negative for fever, chills, diaphoresis, appetite change and fatigue.  HENT: Negative for ear pain, sore throat, trouble swallowing, neck pain and ear discharge.   Eyes: Negative for photophobia, discharge and visual disturbance.  Respiratory: Negative for cough, choking, chest tightness and shortness of breath.   Cardiovascular: Negative for chest pain and palpitations.  Gastrointestinal: Negative for nausea, vomiting, abdominal pain, diarrhea,  constipation, anal bleeding and rectal pain.  Genitourinary: Negative for dysuria, frequency and difficulty urinating.  Musculoskeletal: Negative for myalgias and gait problem.  Skin: Negative for color change, pallor and rash.  Neurological: Negative for dizziness, speech difficulty, weakness and numbness.  Hematological: Negative for adenopathy.  Psychiatric/Behavioral: Negative for confusion and agitation. The patient is not nervous/anxious.        Objective:   Physical Exam  Constitutional: She is oriented to person, place, and time. She appears well-developed and well-nourished. No distress.  HENT:  Head: Normocephalic.  Mouth/Throat: Oropharynx is clear and moist. No oropharyngeal exudate.  Eyes: Conjunctivae normal and EOM are normal. Pupils are equal, round, and reactive to light. No scleral icterus.  Neck: Normal range of motion. No tracheal deviation present.  Cardiovascular: Normal rate and intact distal pulses.   Pulmonary/Chest: Effort normal. No respiratory distress. She exhibits no tenderness.  Abdominal: Soft. She exhibits no distension. There is no tenderness. Hernia confirmed negative in the right inguinal area and confirmed negative in the left inguinal area.       Incisions clean with normal healing ridges.  No hernias.  CAPD cath in R suprapubic region  Genitourinary: No vaginal discharge found.  Musculoskeletal: Normal range of motion. She exhibits no tenderness.  Lymphadenopathy:       Right: No inguinal adenopathy present.       Left: No inguinal adenopathy present.  Neurological: She is alert and oriented to person, place, and time. No cranial nerve deficit. She exhibits normal muscle tone. Coordination normal.  Skin: Skin is warm and dry. No rash noted. She is not diaphoretic.  Psychiatric: She has a normal mood and affect. Her behavior is normal.       Assessment:     Recovering from CAPD  Still w constipation    Plan:     Increase activity as  tolerated to regular activity.  Do not push through pain.  Diet as tolerated. Bowel regimen to avoid problems.  Increase Benefiber to use less laxatives  Continue CAPD care/flushing   Return to clinic p.r.n.   Instructions discussed.  Followup with primary care physician for other health issues as would normally be done.  Questions answered.  The patient & husband expressed understanding and appreciation

## 2012-02-02 DIAGNOSIS — N185 Chronic kidney disease, stage 5: Secondary | ICD-10-CM | POA: Diagnosis not present

## 2012-02-07 DIAGNOSIS — N186 End stage renal disease: Secondary | ICD-10-CM | POA: Diagnosis not present

## 2012-02-08 DIAGNOSIS — N186 End stage renal disease: Secondary | ICD-10-CM | POA: Diagnosis not present

## 2012-02-09 DIAGNOSIS — N186 End stage renal disease: Secondary | ICD-10-CM | POA: Diagnosis not present

## 2012-02-10 DIAGNOSIS — N186 End stage renal disease: Secondary | ICD-10-CM | POA: Diagnosis not present

## 2012-02-13 DIAGNOSIS — N186 End stage renal disease: Secondary | ICD-10-CM | POA: Diagnosis not present

## 2012-02-14 DIAGNOSIS — N186 End stage renal disease: Secondary | ICD-10-CM | POA: Diagnosis not present

## 2012-02-15 DIAGNOSIS — N186 End stage renal disease: Secondary | ICD-10-CM | POA: Diagnosis not present

## 2012-02-16 DIAGNOSIS — N186 End stage renal disease: Secondary | ICD-10-CM | POA: Diagnosis not present

## 2012-02-17 DIAGNOSIS — N186 End stage renal disease: Secondary | ICD-10-CM | POA: Diagnosis not present

## 2012-02-18 DIAGNOSIS — N186 End stage renal disease: Secondary | ICD-10-CM | POA: Diagnosis not present

## 2012-02-19 DIAGNOSIS — N186 End stage renal disease: Secondary | ICD-10-CM | POA: Diagnosis not present

## 2012-02-20 DIAGNOSIS — N186 End stage renal disease: Secondary | ICD-10-CM | POA: Diagnosis not present

## 2012-02-21 DIAGNOSIS — N186 End stage renal disease: Secondary | ICD-10-CM | POA: Diagnosis not present

## 2012-02-22 DIAGNOSIS — N186 End stage renal disease: Secondary | ICD-10-CM | POA: Diagnosis not present

## 2012-02-23 DIAGNOSIS — N186 End stage renal disease: Secondary | ICD-10-CM | POA: Diagnosis not present

## 2012-02-24 DIAGNOSIS — N186 End stage renal disease: Secondary | ICD-10-CM | POA: Diagnosis not present

## 2012-02-25 DIAGNOSIS — N186 End stage renal disease: Secondary | ICD-10-CM | POA: Diagnosis not present

## 2012-02-26 DIAGNOSIS — N186 End stage renal disease: Secondary | ICD-10-CM | POA: Diagnosis not present

## 2012-02-27 DIAGNOSIS — N186 End stage renal disease: Secondary | ICD-10-CM | POA: Diagnosis not present

## 2012-02-28 DIAGNOSIS — N186 End stage renal disease: Secondary | ICD-10-CM | POA: Diagnosis not present

## 2012-02-29 DIAGNOSIS — N186 End stage renal disease: Secondary | ICD-10-CM | POA: Diagnosis not present

## 2012-02-29 DIAGNOSIS — Z23 Encounter for immunization: Secondary | ICD-10-CM | POA: Diagnosis not present

## 2012-02-29 DIAGNOSIS — D509 Iron deficiency anemia, unspecified: Secondary | ICD-10-CM | POA: Diagnosis not present

## 2012-03-01 DIAGNOSIS — Z23 Encounter for immunization: Secondary | ICD-10-CM | POA: Diagnosis not present

## 2012-03-01 DIAGNOSIS — D509 Iron deficiency anemia, unspecified: Secondary | ICD-10-CM | POA: Diagnosis not present

## 2012-03-01 DIAGNOSIS — N186 End stage renal disease: Secondary | ICD-10-CM | POA: Diagnosis not present

## 2012-03-02 DIAGNOSIS — N186 End stage renal disease: Secondary | ICD-10-CM | POA: Diagnosis not present

## 2012-03-02 DIAGNOSIS — D509 Iron deficiency anemia, unspecified: Secondary | ICD-10-CM | POA: Diagnosis not present

## 2012-03-02 DIAGNOSIS — Z23 Encounter for immunization: Secondary | ICD-10-CM | POA: Diagnosis not present

## 2012-03-03 DIAGNOSIS — Z23 Encounter for immunization: Secondary | ICD-10-CM | POA: Diagnosis not present

## 2012-03-03 DIAGNOSIS — D509 Iron deficiency anemia, unspecified: Secondary | ICD-10-CM | POA: Diagnosis not present

## 2012-03-03 DIAGNOSIS — N186 End stage renal disease: Secondary | ICD-10-CM | POA: Diagnosis not present

## 2012-03-04 DIAGNOSIS — Z23 Encounter for immunization: Secondary | ICD-10-CM | POA: Diagnosis not present

## 2012-03-04 DIAGNOSIS — D509 Iron deficiency anemia, unspecified: Secondary | ICD-10-CM | POA: Diagnosis not present

## 2012-03-04 DIAGNOSIS — N186 End stage renal disease: Secondary | ICD-10-CM | POA: Diagnosis not present

## 2012-03-05 DIAGNOSIS — D509 Iron deficiency anemia, unspecified: Secondary | ICD-10-CM | POA: Diagnosis not present

## 2012-03-05 DIAGNOSIS — N186 End stage renal disease: Secondary | ICD-10-CM | POA: Diagnosis not present

## 2012-03-05 DIAGNOSIS — Z23 Encounter for immunization: Secondary | ICD-10-CM | POA: Diagnosis not present

## 2012-03-06 DIAGNOSIS — N186 End stage renal disease: Secondary | ICD-10-CM | POA: Diagnosis not present

## 2012-03-06 DIAGNOSIS — Z23 Encounter for immunization: Secondary | ICD-10-CM | POA: Diagnosis not present

## 2012-03-06 DIAGNOSIS — D509 Iron deficiency anemia, unspecified: Secondary | ICD-10-CM | POA: Diagnosis not present

## 2012-03-07 DIAGNOSIS — N186 End stage renal disease: Secondary | ICD-10-CM | POA: Diagnosis not present

## 2012-03-07 DIAGNOSIS — Z23 Encounter for immunization: Secondary | ICD-10-CM | POA: Diagnosis not present

## 2012-03-07 DIAGNOSIS — D509 Iron deficiency anemia, unspecified: Secondary | ICD-10-CM | POA: Diagnosis not present

## 2012-03-08 DIAGNOSIS — Z23 Encounter for immunization: Secondary | ICD-10-CM | POA: Diagnosis not present

## 2012-03-08 DIAGNOSIS — D509 Iron deficiency anemia, unspecified: Secondary | ICD-10-CM | POA: Diagnosis not present

## 2012-03-08 DIAGNOSIS — N186 End stage renal disease: Secondary | ICD-10-CM | POA: Diagnosis not present

## 2012-03-09 DIAGNOSIS — D509 Iron deficiency anemia, unspecified: Secondary | ICD-10-CM | POA: Diagnosis not present

## 2012-03-09 DIAGNOSIS — Z23 Encounter for immunization: Secondary | ICD-10-CM | POA: Diagnosis not present

## 2012-03-09 DIAGNOSIS — N186 End stage renal disease: Secondary | ICD-10-CM | POA: Diagnosis not present

## 2012-03-10 DIAGNOSIS — D509 Iron deficiency anemia, unspecified: Secondary | ICD-10-CM | POA: Diagnosis not present

## 2012-03-10 DIAGNOSIS — Z23 Encounter for immunization: Secondary | ICD-10-CM | POA: Diagnosis not present

## 2012-03-10 DIAGNOSIS — N186 End stage renal disease: Secondary | ICD-10-CM | POA: Diagnosis not present

## 2012-03-11 DIAGNOSIS — Z23 Encounter for immunization: Secondary | ICD-10-CM | POA: Diagnosis not present

## 2012-03-11 DIAGNOSIS — N186 End stage renal disease: Secondary | ICD-10-CM | POA: Diagnosis not present

## 2012-03-11 DIAGNOSIS — D509 Iron deficiency anemia, unspecified: Secondary | ICD-10-CM | POA: Diagnosis not present

## 2012-03-12 DIAGNOSIS — Z23 Encounter for immunization: Secondary | ICD-10-CM | POA: Diagnosis not present

## 2012-03-12 DIAGNOSIS — D509 Iron deficiency anemia, unspecified: Secondary | ICD-10-CM | POA: Diagnosis not present

## 2012-03-12 DIAGNOSIS — N186 End stage renal disease: Secondary | ICD-10-CM | POA: Diagnosis not present

## 2012-03-13 DIAGNOSIS — D509 Iron deficiency anemia, unspecified: Secondary | ICD-10-CM | POA: Diagnosis not present

## 2012-03-13 DIAGNOSIS — Z23 Encounter for immunization: Secondary | ICD-10-CM | POA: Diagnosis not present

## 2012-03-13 DIAGNOSIS — N186 End stage renal disease: Secondary | ICD-10-CM | POA: Diagnosis not present

## 2012-03-14 DIAGNOSIS — D509 Iron deficiency anemia, unspecified: Secondary | ICD-10-CM | POA: Diagnosis not present

## 2012-03-14 DIAGNOSIS — N186 End stage renal disease: Secondary | ICD-10-CM | POA: Diagnosis not present

## 2012-03-14 DIAGNOSIS — Z23 Encounter for immunization: Secondary | ICD-10-CM | POA: Diagnosis not present

## 2012-03-15 DIAGNOSIS — Z23 Encounter for immunization: Secondary | ICD-10-CM | POA: Diagnosis not present

## 2012-03-15 DIAGNOSIS — N186 End stage renal disease: Secondary | ICD-10-CM | POA: Diagnosis not present

## 2012-03-15 DIAGNOSIS — D509 Iron deficiency anemia, unspecified: Secondary | ICD-10-CM | POA: Diagnosis not present

## 2012-03-16 DIAGNOSIS — D509 Iron deficiency anemia, unspecified: Secondary | ICD-10-CM | POA: Diagnosis not present

## 2012-03-16 DIAGNOSIS — Z23 Encounter for immunization: Secondary | ICD-10-CM | POA: Diagnosis not present

## 2012-03-16 DIAGNOSIS — N186 End stage renal disease: Secondary | ICD-10-CM | POA: Diagnosis not present

## 2012-03-17 DIAGNOSIS — N186 End stage renal disease: Secondary | ICD-10-CM | POA: Diagnosis not present

## 2012-03-17 DIAGNOSIS — D509 Iron deficiency anemia, unspecified: Secondary | ICD-10-CM | POA: Diagnosis not present

## 2012-03-17 DIAGNOSIS — Z23 Encounter for immunization: Secondary | ICD-10-CM | POA: Diagnosis not present

## 2012-03-18 DIAGNOSIS — D509 Iron deficiency anemia, unspecified: Secondary | ICD-10-CM | POA: Diagnosis not present

## 2012-03-18 DIAGNOSIS — N186 End stage renal disease: Secondary | ICD-10-CM | POA: Diagnosis not present

## 2012-03-18 DIAGNOSIS — Z23 Encounter for immunization: Secondary | ICD-10-CM | POA: Diagnosis not present

## 2012-03-19 DIAGNOSIS — D509 Iron deficiency anemia, unspecified: Secondary | ICD-10-CM | POA: Diagnosis not present

## 2012-03-19 DIAGNOSIS — Z23 Encounter for immunization: Secondary | ICD-10-CM | POA: Diagnosis not present

## 2012-03-19 DIAGNOSIS — N186 End stage renal disease: Secondary | ICD-10-CM | POA: Diagnosis not present

## 2012-03-20 DIAGNOSIS — N186 End stage renal disease: Secondary | ICD-10-CM | POA: Diagnosis not present

## 2012-03-20 DIAGNOSIS — Z23 Encounter for immunization: Secondary | ICD-10-CM | POA: Diagnosis not present

## 2012-03-20 DIAGNOSIS — D509 Iron deficiency anemia, unspecified: Secondary | ICD-10-CM | POA: Diagnosis not present

## 2012-03-21 DIAGNOSIS — D509 Iron deficiency anemia, unspecified: Secondary | ICD-10-CM | POA: Diagnosis not present

## 2012-03-21 DIAGNOSIS — N186 End stage renal disease: Secondary | ICD-10-CM | POA: Diagnosis not present

## 2012-03-21 DIAGNOSIS — Z23 Encounter for immunization: Secondary | ICD-10-CM | POA: Diagnosis not present

## 2012-03-22 DIAGNOSIS — D509 Iron deficiency anemia, unspecified: Secondary | ICD-10-CM | POA: Diagnosis not present

## 2012-03-22 DIAGNOSIS — Z23 Encounter for immunization: Secondary | ICD-10-CM | POA: Diagnosis not present

## 2012-03-22 DIAGNOSIS — N186 End stage renal disease: Secondary | ICD-10-CM | POA: Diagnosis not present

## 2012-03-23 DIAGNOSIS — Z23 Encounter for immunization: Secondary | ICD-10-CM | POA: Diagnosis not present

## 2012-03-23 DIAGNOSIS — D509 Iron deficiency anemia, unspecified: Secondary | ICD-10-CM | POA: Diagnosis not present

## 2012-03-23 DIAGNOSIS — N186 End stage renal disease: Secondary | ICD-10-CM | POA: Diagnosis not present

## 2012-03-24 DIAGNOSIS — N186 End stage renal disease: Secondary | ICD-10-CM | POA: Diagnosis not present

## 2012-03-24 DIAGNOSIS — Z23 Encounter for immunization: Secondary | ICD-10-CM | POA: Diagnosis not present

## 2012-03-24 DIAGNOSIS — D509 Iron deficiency anemia, unspecified: Secondary | ICD-10-CM | POA: Diagnosis not present

## 2012-03-25 DIAGNOSIS — Z23 Encounter for immunization: Secondary | ICD-10-CM | POA: Diagnosis not present

## 2012-03-25 DIAGNOSIS — N186 End stage renal disease: Secondary | ICD-10-CM | POA: Diagnosis not present

## 2012-03-25 DIAGNOSIS — D509 Iron deficiency anemia, unspecified: Secondary | ICD-10-CM | POA: Diagnosis not present

## 2012-03-26 DIAGNOSIS — N186 End stage renal disease: Secondary | ICD-10-CM | POA: Diagnosis not present

## 2012-03-26 DIAGNOSIS — Z23 Encounter for immunization: Secondary | ICD-10-CM | POA: Diagnosis not present

## 2012-03-26 DIAGNOSIS — D509 Iron deficiency anemia, unspecified: Secondary | ICD-10-CM | POA: Diagnosis not present

## 2012-03-27 DIAGNOSIS — Z23 Encounter for immunization: Secondary | ICD-10-CM | POA: Diagnosis not present

## 2012-03-27 DIAGNOSIS — D509 Iron deficiency anemia, unspecified: Secondary | ICD-10-CM | POA: Diagnosis not present

## 2012-03-27 DIAGNOSIS — N186 End stage renal disease: Secondary | ICD-10-CM | POA: Diagnosis not present

## 2012-03-28 DIAGNOSIS — Z23 Encounter for immunization: Secondary | ICD-10-CM | POA: Diagnosis not present

## 2012-03-28 DIAGNOSIS — D509 Iron deficiency anemia, unspecified: Secondary | ICD-10-CM | POA: Diagnosis not present

## 2012-03-28 DIAGNOSIS — N186 End stage renal disease: Secondary | ICD-10-CM | POA: Diagnosis not present

## 2012-03-29 DIAGNOSIS — N186 End stage renal disease: Secondary | ICD-10-CM | POA: Diagnosis not present

## 2012-03-29 DIAGNOSIS — D509 Iron deficiency anemia, unspecified: Secondary | ICD-10-CM | POA: Diagnosis not present

## 2012-03-29 DIAGNOSIS — Z23 Encounter for immunization: Secondary | ICD-10-CM | POA: Diagnosis not present

## 2012-03-30 DIAGNOSIS — Z23 Encounter for immunization: Secondary | ICD-10-CM | POA: Diagnosis not present

## 2012-03-30 DIAGNOSIS — D509 Iron deficiency anemia, unspecified: Secondary | ICD-10-CM | POA: Diagnosis not present

## 2012-03-30 DIAGNOSIS — N186 End stage renal disease: Secondary | ICD-10-CM | POA: Diagnosis not present

## 2012-03-31 DIAGNOSIS — D509 Iron deficiency anemia, unspecified: Secondary | ICD-10-CM | POA: Diagnosis not present

## 2012-03-31 DIAGNOSIS — N186 End stage renal disease: Secondary | ICD-10-CM | POA: Diagnosis not present

## 2012-03-31 DIAGNOSIS — Z23 Encounter for immunization: Secondary | ICD-10-CM | POA: Diagnosis not present

## 2012-03-31 DIAGNOSIS — N2581 Secondary hyperparathyroidism of renal origin: Secondary | ICD-10-CM | POA: Diagnosis not present

## 2012-04-01 DIAGNOSIS — N2581 Secondary hyperparathyroidism of renal origin: Secondary | ICD-10-CM | POA: Diagnosis not present

## 2012-04-01 DIAGNOSIS — D509 Iron deficiency anemia, unspecified: Secondary | ICD-10-CM | POA: Diagnosis not present

## 2012-04-01 DIAGNOSIS — N186 End stage renal disease: Secondary | ICD-10-CM | POA: Diagnosis not present

## 2012-04-01 DIAGNOSIS — Z23 Encounter for immunization: Secondary | ICD-10-CM | POA: Diagnosis not present

## 2012-04-02 DIAGNOSIS — N186 End stage renal disease: Secondary | ICD-10-CM | POA: Diagnosis not present

## 2012-04-02 DIAGNOSIS — N2581 Secondary hyperparathyroidism of renal origin: Secondary | ICD-10-CM | POA: Diagnosis not present

## 2012-04-02 DIAGNOSIS — D509 Iron deficiency anemia, unspecified: Secondary | ICD-10-CM | POA: Diagnosis not present

## 2012-04-02 DIAGNOSIS — Z23 Encounter for immunization: Secondary | ICD-10-CM | POA: Diagnosis not present

## 2012-04-03 DIAGNOSIS — D509 Iron deficiency anemia, unspecified: Secondary | ICD-10-CM | POA: Diagnosis not present

## 2012-04-03 DIAGNOSIS — N186 End stage renal disease: Secondary | ICD-10-CM | POA: Diagnosis not present

## 2012-04-03 DIAGNOSIS — Z23 Encounter for immunization: Secondary | ICD-10-CM | POA: Diagnosis not present

## 2012-04-03 DIAGNOSIS — N2581 Secondary hyperparathyroidism of renal origin: Secondary | ICD-10-CM | POA: Diagnosis not present

## 2012-04-04 DIAGNOSIS — N2581 Secondary hyperparathyroidism of renal origin: Secondary | ICD-10-CM | POA: Diagnosis not present

## 2012-04-04 DIAGNOSIS — D509 Iron deficiency anemia, unspecified: Secondary | ICD-10-CM | POA: Diagnosis not present

## 2012-04-04 DIAGNOSIS — N186 End stage renal disease: Secondary | ICD-10-CM | POA: Diagnosis not present

## 2012-04-04 DIAGNOSIS — Z23 Encounter for immunization: Secondary | ICD-10-CM | POA: Diagnosis not present

## 2012-04-05 DIAGNOSIS — Z23 Encounter for immunization: Secondary | ICD-10-CM | POA: Diagnosis not present

## 2012-04-05 DIAGNOSIS — D509 Iron deficiency anemia, unspecified: Secondary | ICD-10-CM | POA: Diagnosis not present

## 2012-04-05 DIAGNOSIS — N2581 Secondary hyperparathyroidism of renal origin: Secondary | ICD-10-CM | POA: Diagnosis not present

## 2012-04-05 DIAGNOSIS — N186 End stage renal disease: Secondary | ICD-10-CM | POA: Diagnosis not present

## 2012-04-06 DIAGNOSIS — Z23 Encounter for immunization: Secondary | ICD-10-CM | POA: Diagnosis not present

## 2012-04-06 DIAGNOSIS — N2581 Secondary hyperparathyroidism of renal origin: Secondary | ICD-10-CM | POA: Diagnosis not present

## 2012-04-06 DIAGNOSIS — D509 Iron deficiency anemia, unspecified: Secondary | ICD-10-CM | POA: Diagnosis not present

## 2012-04-06 DIAGNOSIS — N186 End stage renal disease: Secondary | ICD-10-CM | POA: Diagnosis not present

## 2012-04-07 DIAGNOSIS — N186 End stage renal disease: Secondary | ICD-10-CM | POA: Diagnosis not present

## 2012-04-07 DIAGNOSIS — N2581 Secondary hyperparathyroidism of renal origin: Secondary | ICD-10-CM | POA: Diagnosis not present

## 2012-04-07 DIAGNOSIS — D509 Iron deficiency anemia, unspecified: Secondary | ICD-10-CM | POA: Diagnosis not present

## 2012-04-07 DIAGNOSIS — Z23 Encounter for immunization: Secondary | ICD-10-CM | POA: Diagnosis not present

## 2012-04-08 DIAGNOSIS — D509 Iron deficiency anemia, unspecified: Secondary | ICD-10-CM | POA: Diagnosis not present

## 2012-04-08 DIAGNOSIS — N2581 Secondary hyperparathyroidism of renal origin: Secondary | ICD-10-CM | POA: Diagnosis not present

## 2012-04-08 DIAGNOSIS — N186 End stage renal disease: Secondary | ICD-10-CM | POA: Diagnosis not present

## 2012-04-08 DIAGNOSIS — Z23 Encounter for immunization: Secondary | ICD-10-CM | POA: Diagnosis not present

## 2012-04-09 DIAGNOSIS — Z23 Encounter for immunization: Secondary | ICD-10-CM | POA: Diagnosis not present

## 2012-04-09 DIAGNOSIS — D509 Iron deficiency anemia, unspecified: Secondary | ICD-10-CM | POA: Diagnosis not present

## 2012-04-09 DIAGNOSIS — N2581 Secondary hyperparathyroidism of renal origin: Secondary | ICD-10-CM | POA: Diagnosis not present

## 2012-04-09 DIAGNOSIS — N186 End stage renal disease: Secondary | ICD-10-CM | POA: Diagnosis not present

## 2012-04-10 DIAGNOSIS — D509 Iron deficiency anemia, unspecified: Secondary | ICD-10-CM | POA: Diagnosis not present

## 2012-04-10 DIAGNOSIS — Z23 Encounter for immunization: Secondary | ICD-10-CM | POA: Diagnosis not present

## 2012-04-10 DIAGNOSIS — N186 End stage renal disease: Secondary | ICD-10-CM | POA: Diagnosis not present

## 2012-04-10 DIAGNOSIS — N2581 Secondary hyperparathyroidism of renal origin: Secondary | ICD-10-CM | POA: Diagnosis not present

## 2012-04-11 DIAGNOSIS — N186 End stage renal disease: Secondary | ICD-10-CM | POA: Diagnosis not present

## 2012-04-11 DIAGNOSIS — N2581 Secondary hyperparathyroidism of renal origin: Secondary | ICD-10-CM | POA: Diagnosis not present

## 2012-04-11 DIAGNOSIS — Z23 Encounter for immunization: Secondary | ICD-10-CM | POA: Diagnosis not present

## 2012-04-11 DIAGNOSIS — D509 Iron deficiency anemia, unspecified: Secondary | ICD-10-CM | POA: Diagnosis not present

## 2012-04-12 DIAGNOSIS — D509 Iron deficiency anemia, unspecified: Secondary | ICD-10-CM | POA: Diagnosis not present

## 2012-04-12 DIAGNOSIS — N2581 Secondary hyperparathyroidism of renal origin: Secondary | ICD-10-CM | POA: Diagnosis not present

## 2012-04-12 DIAGNOSIS — N186 End stage renal disease: Secondary | ICD-10-CM | POA: Diagnosis not present

## 2012-04-12 DIAGNOSIS — Z23 Encounter for immunization: Secondary | ICD-10-CM | POA: Diagnosis not present

## 2012-04-13 DIAGNOSIS — D509 Iron deficiency anemia, unspecified: Secondary | ICD-10-CM | POA: Diagnosis not present

## 2012-04-13 DIAGNOSIS — Z23 Encounter for immunization: Secondary | ICD-10-CM | POA: Diagnosis not present

## 2012-04-13 DIAGNOSIS — N186 End stage renal disease: Secondary | ICD-10-CM | POA: Diagnosis not present

## 2012-04-13 DIAGNOSIS — N2581 Secondary hyperparathyroidism of renal origin: Secondary | ICD-10-CM | POA: Diagnosis not present

## 2012-04-14 DIAGNOSIS — Z23 Encounter for immunization: Secondary | ICD-10-CM | POA: Diagnosis not present

## 2012-04-14 DIAGNOSIS — D509 Iron deficiency anemia, unspecified: Secondary | ICD-10-CM | POA: Diagnosis not present

## 2012-04-14 DIAGNOSIS — N2581 Secondary hyperparathyroidism of renal origin: Secondary | ICD-10-CM | POA: Diagnosis not present

## 2012-04-14 DIAGNOSIS — N186 End stage renal disease: Secondary | ICD-10-CM | POA: Diagnosis not present

## 2012-04-15 DIAGNOSIS — Z23 Encounter for immunization: Secondary | ICD-10-CM | POA: Diagnosis not present

## 2012-04-15 DIAGNOSIS — N2581 Secondary hyperparathyroidism of renal origin: Secondary | ICD-10-CM | POA: Diagnosis not present

## 2012-04-15 DIAGNOSIS — N186 End stage renal disease: Secondary | ICD-10-CM | POA: Diagnosis not present

## 2012-04-15 DIAGNOSIS — D509 Iron deficiency anemia, unspecified: Secondary | ICD-10-CM | POA: Diagnosis not present

## 2012-04-16 DIAGNOSIS — D509 Iron deficiency anemia, unspecified: Secondary | ICD-10-CM | POA: Diagnosis not present

## 2012-04-16 DIAGNOSIS — N2581 Secondary hyperparathyroidism of renal origin: Secondary | ICD-10-CM | POA: Diagnosis not present

## 2012-04-16 DIAGNOSIS — N186 End stage renal disease: Secondary | ICD-10-CM | POA: Diagnosis not present

## 2012-04-16 DIAGNOSIS — Z23 Encounter for immunization: Secondary | ICD-10-CM | POA: Diagnosis not present

## 2012-04-17 DIAGNOSIS — N2581 Secondary hyperparathyroidism of renal origin: Secondary | ICD-10-CM | POA: Diagnosis not present

## 2012-04-17 DIAGNOSIS — Z23 Encounter for immunization: Secondary | ICD-10-CM | POA: Diagnosis not present

## 2012-04-17 DIAGNOSIS — D509 Iron deficiency anemia, unspecified: Secondary | ICD-10-CM | POA: Diagnosis not present

## 2012-04-17 DIAGNOSIS — N186 End stage renal disease: Secondary | ICD-10-CM | POA: Diagnosis not present

## 2012-04-18 DIAGNOSIS — N186 End stage renal disease: Secondary | ICD-10-CM | POA: Diagnosis not present

## 2012-04-18 DIAGNOSIS — D509 Iron deficiency anemia, unspecified: Secondary | ICD-10-CM | POA: Diagnosis not present

## 2012-04-18 DIAGNOSIS — Z23 Encounter for immunization: Secondary | ICD-10-CM | POA: Diagnosis not present

## 2012-04-18 DIAGNOSIS — N2581 Secondary hyperparathyroidism of renal origin: Secondary | ICD-10-CM | POA: Diagnosis not present

## 2012-04-19 DIAGNOSIS — Z23 Encounter for immunization: Secondary | ICD-10-CM | POA: Diagnosis not present

## 2012-04-19 DIAGNOSIS — N186 End stage renal disease: Secondary | ICD-10-CM | POA: Diagnosis not present

## 2012-04-19 DIAGNOSIS — D509 Iron deficiency anemia, unspecified: Secondary | ICD-10-CM | POA: Diagnosis not present

## 2012-04-19 DIAGNOSIS — N2581 Secondary hyperparathyroidism of renal origin: Secondary | ICD-10-CM | POA: Diagnosis not present

## 2012-04-20 DIAGNOSIS — Z23 Encounter for immunization: Secondary | ICD-10-CM | POA: Diagnosis not present

## 2012-04-20 DIAGNOSIS — D509 Iron deficiency anemia, unspecified: Secondary | ICD-10-CM | POA: Diagnosis not present

## 2012-04-20 DIAGNOSIS — N186 End stage renal disease: Secondary | ICD-10-CM | POA: Diagnosis not present

## 2012-04-20 DIAGNOSIS — N2581 Secondary hyperparathyroidism of renal origin: Secondary | ICD-10-CM | POA: Diagnosis not present

## 2012-04-21 DIAGNOSIS — N2581 Secondary hyperparathyroidism of renal origin: Secondary | ICD-10-CM | POA: Diagnosis not present

## 2012-04-21 DIAGNOSIS — D509 Iron deficiency anemia, unspecified: Secondary | ICD-10-CM | POA: Diagnosis not present

## 2012-04-21 DIAGNOSIS — N186 End stage renal disease: Secondary | ICD-10-CM | POA: Diagnosis not present

## 2012-04-21 DIAGNOSIS — Z23 Encounter for immunization: Secondary | ICD-10-CM | POA: Diagnosis not present

## 2012-04-22 DIAGNOSIS — N186 End stage renal disease: Secondary | ICD-10-CM | POA: Diagnosis not present

## 2012-04-22 DIAGNOSIS — N2581 Secondary hyperparathyroidism of renal origin: Secondary | ICD-10-CM | POA: Diagnosis not present

## 2012-04-22 DIAGNOSIS — Z23 Encounter for immunization: Secondary | ICD-10-CM | POA: Diagnosis not present

## 2012-04-22 DIAGNOSIS — D509 Iron deficiency anemia, unspecified: Secondary | ICD-10-CM | POA: Diagnosis not present

## 2012-04-23 DIAGNOSIS — Z23 Encounter for immunization: Secondary | ICD-10-CM | POA: Diagnosis not present

## 2012-04-23 DIAGNOSIS — D509 Iron deficiency anemia, unspecified: Secondary | ICD-10-CM | POA: Diagnosis not present

## 2012-04-23 DIAGNOSIS — N2581 Secondary hyperparathyroidism of renal origin: Secondary | ICD-10-CM | POA: Diagnosis not present

## 2012-04-23 DIAGNOSIS — N186 End stage renal disease: Secondary | ICD-10-CM | POA: Diagnosis not present

## 2012-04-24 DIAGNOSIS — N186 End stage renal disease: Secondary | ICD-10-CM | POA: Diagnosis not present

## 2012-04-24 DIAGNOSIS — N2581 Secondary hyperparathyroidism of renal origin: Secondary | ICD-10-CM | POA: Diagnosis not present

## 2012-04-24 DIAGNOSIS — Z23 Encounter for immunization: Secondary | ICD-10-CM | POA: Diagnosis not present

## 2012-04-24 DIAGNOSIS — D509 Iron deficiency anemia, unspecified: Secondary | ICD-10-CM | POA: Diagnosis not present

## 2012-04-25 DIAGNOSIS — D509 Iron deficiency anemia, unspecified: Secondary | ICD-10-CM | POA: Diagnosis not present

## 2012-04-25 DIAGNOSIS — N2581 Secondary hyperparathyroidism of renal origin: Secondary | ICD-10-CM | POA: Diagnosis not present

## 2012-04-25 DIAGNOSIS — Z23 Encounter for immunization: Secondary | ICD-10-CM | POA: Diagnosis not present

## 2012-04-25 DIAGNOSIS — N186 End stage renal disease: Secondary | ICD-10-CM | POA: Diagnosis not present

## 2012-04-26 DIAGNOSIS — Z23 Encounter for immunization: Secondary | ICD-10-CM | POA: Diagnosis not present

## 2012-04-26 DIAGNOSIS — N186 End stage renal disease: Secondary | ICD-10-CM | POA: Diagnosis not present

## 2012-04-26 DIAGNOSIS — D509 Iron deficiency anemia, unspecified: Secondary | ICD-10-CM | POA: Diagnosis not present

## 2012-04-26 DIAGNOSIS — N2581 Secondary hyperparathyroidism of renal origin: Secondary | ICD-10-CM | POA: Diagnosis not present

## 2012-04-27 DIAGNOSIS — D509 Iron deficiency anemia, unspecified: Secondary | ICD-10-CM | POA: Diagnosis not present

## 2012-04-27 DIAGNOSIS — N2581 Secondary hyperparathyroidism of renal origin: Secondary | ICD-10-CM | POA: Diagnosis not present

## 2012-04-27 DIAGNOSIS — Z23 Encounter for immunization: Secondary | ICD-10-CM | POA: Diagnosis not present

## 2012-04-27 DIAGNOSIS — N186 End stage renal disease: Secondary | ICD-10-CM | POA: Diagnosis not present

## 2012-04-28 DIAGNOSIS — N2581 Secondary hyperparathyroidism of renal origin: Secondary | ICD-10-CM | POA: Diagnosis not present

## 2012-04-28 DIAGNOSIS — D509 Iron deficiency anemia, unspecified: Secondary | ICD-10-CM | POA: Diagnosis not present

## 2012-04-28 DIAGNOSIS — N186 End stage renal disease: Secondary | ICD-10-CM | POA: Diagnosis not present

## 2012-04-28 DIAGNOSIS — Z23 Encounter for immunization: Secondary | ICD-10-CM | POA: Diagnosis not present

## 2012-04-29 DIAGNOSIS — D509 Iron deficiency anemia, unspecified: Secondary | ICD-10-CM | POA: Diagnosis not present

## 2012-04-29 DIAGNOSIS — Z23 Encounter for immunization: Secondary | ICD-10-CM | POA: Diagnosis not present

## 2012-04-29 DIAGNOSIS — N2581 Secondary hyperparathyroidism of renal origin: Secondary | ICD-10-CM | POA: Diagnosis not present

## 2012-04-29 DIAGNOSIS — N186 End stage renal disease: Secondary | ICD-10-CM | POA: Diagnosis not present

## 2012-04-30 DIAGNOSIS — N186 End stage renal disease: Secondary | ICD-10-CM | POA: Diagnosis not present

## 2012-04-30 DIAGNOSIS — D509 Iron deficiency anemia, unspecified: Secondary | ICD-10-CM | POA: Diagnosis not present

## 2012-04-30 DIAGNOSIS — N2581 Secondary hyperparathyroidism of renal origin: Secondary | ICD-10-CM | POA: Diagnosis not present

## 2012-04-30 DIAGNOSIS — Z23 Encounter for immunization: Secondary | ICD-10-CM | POA: Diagnosis not present

## 2012-05-01 DIAGNOSIS — D509 Iron deficiency anemia, unspecified: Secondary | ICD-10-CM | POA: Diagnosis not present

## 2012-05-01 DIAGNOSIS — N2581 Secondary hyperparathyroidism of renal origin: Secondary | ICD-10-CM | POA: Diagnosis not present

## 2012-05-01 DIAGNOSIS — Z23 Encounter for immunization: Secondary | ICD-10-CM | POA: Diagnosis not present

## 2012-05-01 DIAGNOSIS — N186 End stage renal disease: Secondary | ICD-10-CM | POA: Diagnosis not present

## 2012-05-02 DIAGNOSIS — N2581 Secondary hyperparathyroidism of renal origin: Secondary | ICD-10-CM | POA: Diagnosis not present

## 2012-05-02 DIAGNOSIS — Z23 Encounter for immunization: Secondary | ICD-10-CM | POA: Diagnosis not present

## 2012-05-02 DIAGNOSIS — N186 End stage renal disease: Secondary | ICD-10-CM | POA: Diagnosis not present

## 2012-05-02 DIAGNOSIS — D509 Iron deficiency anemia, unspecified: Secondary | ICD-10-CM | POA: Diagnosis not present

## 2012-05-03 DIAGNOSIS — Z23 Encounter for immunization: Secondary | ICD-10-CM | POA: Diagnosis not present

## 2012-05-03 DIAGNOSIS — N2581 Secondary hyperparathyroidism of renal origin: Secondary | ICD-10-CM | POA: Diagnosis not present

## 2012-05-03 DIAGNOSIS — D509 Iron deficiency anemia, unspecified: Secondary | ICD-10-CM | POA: Diagnosis not present

## 2012-05-03 DIAGNOSIS — N186 End stage renal disease: Secondary | ICD-10-CM | POA: Diagnosis not present

## 2012-05-04 DIAGNOSIS — Z23 Encounter for immunization: Secondary | ICD-10-CM | POA: Diagnosis not present

## 2012-05-04 DIAGNOSIS — N2581 Secondary hyperparathyroidism of renal origin: Secondary | ICD-10-CM | POA: Diagnosis not present

## 2012-05-04 DIAGNOSIS — D509 Iron deficiency anemia, unspecified: Secondary | ICD-10-CM | POA: Diagnosis not present

## 2012-05-04 DIAGNOSIS — N186 End stage renal disease: Secondary | ICD-10-CM | POA: Diagnosis not present

## 2012-05-05 DIAGNOSIS — D509 Iron deficiency anemia, unspecified: Secondary | ICD-10-CM | POA: Diagnosis not present

## 2012-05-05 DIAGNOSIS — Z23 Encounter for immunization: Secondary | ICD-10-CM | POA: Diagnosis not present

## 2012-05-05 DIAGNOSIS — N186 End stage renal disease: Secondary | ICD-10-CM | POA: Diagnosis not present

## 2012-05-05 DIAGNOSIS — N2581 Secondary hyperparathyroidism of renal origin: Secondary | ICD-10-CM | POA: Diagnosis not present

## 2012-05-06 DIAGNOSIS — N186 End stage renal disease: Secondary | ICD-10-CM | POA: Diagnosis not present

## 2012-05-06 DIAGNOSIS — D509 Iron deficiency anemia, unspecified: Secondary | ICD-10-CM | POA: Diagnosis not present

## 2012-05-06 DIAGNOSIS — Z23 Encounter for immunization: Secondary | ICD-10-CM | POA: Diagnosis not present

## 2012-05-06 DIAGNOSIS — N2581 Secondary hyperparathyroidism of renal origin: Secondary | ICD-10-CM | POA: Diagnosis not present

## 2012-05-07 DIAGNOSIS — N186 End stage renal disease: Secondary | ICD-10-CM | POA: Diagnosis not present

## 2012-05-07 DIAGNOSIS — D509 Iron deficiency anemia, unspecified: Secondary | ICD-10-CM | POA: Diagnosis not present

## 2012-05-07 DIAGNOSIS — Z23 Encounter for immunization: Secondary | ICD-10-CM | POA: Diagnosis not present

## 2012-05-07 DIAGNOSIS — N2581 Secondary hyperparathyroidism of renal origin: Secondary | ICD-10-CM | POA: Diagnosis not present

## 2012-05-08 DIAGNOSIS — Z23 Encounter for immunization: Secondary | ICD-10-CM | POA: Diagnosis not present

## 2012-05-08 DIAGNOSIS — D509 Iron deficiency anemia, unspecified: Secondary | ICD-10-CM | POA: Diagnosis not present

## 2012-05-08 DIAGNOSIS — N186 End stage renal disease: Secondary | ICD-10-CM | POA: Diagnosis not present

## 2012-05-08 DIAGNOSIS — N2581 Secondary hyperparathyroidism of renal origin: Secondary | ICD-10-CM | POA: Diagnosis not present

## 2012-05-09 DIAGNOSIS — D509 Iron deficiency anemia, unspecified: Secondary | ICD-10-CM | POA: Diagnosis not present

## 2012-05-09 DIAGNOSIS — N2581 Secondary hyperparathyroidism of renal origin: Secondary | ICD-10-CM | POA: Diagnosis not present

## 2012-05-09 DIAGNOSIS — N186 End stage renal disease: Secondary | ICD-10-CM | POA: Diagnosis not present

## 2012-05-09 DIAGNOSIS — Z23 Encounter for immunization: Secondary | ICD-10-CM | POA: Diagnosis not present

## 2012-05-10 DIAGNOSIS — D509 Iron deficiency anemia, unspecified: Secondary | ICD-10-CM | POA: Diagnosis not present

## 2012-05-10 DIAGNOSIS — N186 End stage renal disease: Secondary | ICD-10-CM | POA: Diagnosis not present

## 2012-05-10 DIAGNOSIS — Z23 Encounter for immunization: Secondary | ICD-10-CM | POA: Diagnosis not present

## 2012-05-10 DIAGNOSIS — N2581 Secondary hyperparathyroidism of renal origin: Secondary | ICD-10-CM | POA: Diagnosis not present

## 2012-05-11 DIAGNOSIS — N2581 Secondary hyperparathyroidism of renal origin: Secondary | ICD-10-CM | POA: Diagnosis not present

## 2012-05-11 DIAGNOSIS — N186 End stage renal disease: Secondary | ICD-10-CM | POA: Diagnosis not present

## 2012-05-11 DIAGNOSIS — Z23 Encounter for immunization: Secondary | ICD-10-CM | POA: Diagnosis not present

## 2012-05-11 DIAGNOSIS — D509 Iron deficiency anemia, unspecified: Secondary | ICD-10-CM | POA: Diagnosis not present

## 2012-05-12 DIAGNOSIS — Z23 Encounter for immunization: Secondary | ICD-10-CM | POA: Diagnosis not present

## 2012-05-12 DIAGNOSIS — N186 End stage renal disease: Secondary | ICD-10-CM | POA: Diagnosis not present

## 2012-05-12 DIAGNOSIS — N2581 Secondary hyperparathyroidism of renal origin: Secondary | ICD-10-CM | POA: Diagnosis not present

## 2012-05-12 DIAGNOSIS — D509 Iron deficiency anemia, unspecified: Secondary | ICD-10-CM | POA: Diagnosis not present

## 2012-05-13 DIAGNOSIS — D509 Iron deficiency anemia, unspecified: Secondary | ICD-10-CM | POA: Diagnosis not present

## 2012-05-13 DIAGNOSIS — Z23 Encounter for immunization: Secondary | ICD-10-CM | POA: Diagnosis not present

## 2012-05-13 DIAGNOSIS — N186 End stage renal disease: Secondary | ICD-10-CM | POA: Diagnosis not present

## 2012-05-13 DIAGNOSIS — N2581 Secondary hyperparathyroidism of renal origin: Secondary | ICD-10-CM | POA: Diagnosis not present

## 2012-05-14 DIAGNOSIS — D509 Iron deficiency anemia, unspecified: Secondary | ICD-10-CM | POA: Diagnosis not present

## 2012-05-14 DIAGNOSIS — Z23 Encounter for immunization: Secondary | ICD-10-CM | POA: Diagnosis not present

## 2012-05-14 DIAGNOSIS — N2581 Secondary hyperparathyroidism of renal origin: Secondary | ICD-10-CM | POA: Diagnosis not present

## 2012-05-14 DIAGNOSIS — N186 End stage renal disease: Secondary | ICD-10-CM | POA: Diagnosis not present

## 2012-05-15 DIAGNOSIS — N2581 Secondary hyperparathyroidism of renal origin: Secondary | ICD-10-CM | POA: Diagnosis not present

## 2012-05-15 DIAGNOSIS — Z23 Encounter for immunization: Secondary | ICD-10-CM | POA: Diagnosis not present

## 2012-05-15 DIAGNOSIS — N186 End stage renal disease: Secondary | ICD-10-CM | POA: Diagnosis not present

## 2012-05-15 DIAGNOSIS — D509 Iron deficiency anemia, unspecified: Secondary | ICD-10-CM | POA: Diagnosis not present

## 2012-05-16 DIAGNOSIS — N2581 Secondary hyperparathyroidism of renal origin: Secondary | ICD-10-CM | POA: Diagnosis not present

## 2012-05-16 DIAGNOSIS — Z23 Encounter for immunization: Secondary | ICD-10-CM | POA: Diagnosis not present

## 2012-05-16 DIAGNOSIS — N186 End stage renal disease: Secondary | ICD-10-CM | POA: Diagnosis not present

## 2012-05-16 DIAGNOSIS — D509 Iron deficiency anemia, unspecified: Secondary | ICD-10-CM | POA: Diagnosis not present

## 2012-05-17 DIAGNOSIS — N2581 Secondary hyperparathyroidism of renal origin: Secondary | ICD-10-CM | POA: Diagnosis not present

## 2012-05-17 DIAGNOSIS — D509 Iron deficiency anemia, unspecified: Secondary | ICD-10-CM | POA: Diagnosis not present

## 2012-05-17 DIAGNOSIS — N186 End stage renal disease: Secondary | ICD-10-CM | POA: Diagnosis not present

## 2012-05-17 DIAGNOSIS — Z23 Encounter for immunization: Secondary | ICD-10-CM | POA: Diagnosis not present

## 2012-05-18 DIAGNOSIS — Z23 Encounter for immunization: Secondary | ICD-10-CM | POA: Diagnosis not present

## 2012-05-18 DIAGNOSIS — D509 Iron deficiency anemia, unspecified: Secondary | ICD-10-CM | POA: Diagnosis not present

## 2012-05-18 DIAGNOSIS — N2581 Secondary hyperparathyroidism of renal origin: Secondary | ICD-10-CM | POA: Diagnosis not present

## 2012-05-18 DIAGNOSIS — N186 End stage renal disease: Secondary | ICD-10-CM | POA: Diagnosis not present

## 2012-05-19 DIAGNOSIS — D509 Iron deficiency anemia, unspecified: Secondary | ICD-10-CM | POA: Diagnosis not present

## 2012-05-19 DIAGNOSIS — N2581 Secondary hyperparathyroidism of renal origin: Secondary | ICD-10-CM | POA: Diagnosis not present

## 2012-05-19 DIAGNOSIS — N186 End stage renal disease: Secondary | ICD-10-CM | POA: Diagnosis not present

## 2012-05-19 DIAGNOSIS — Z23 Encounter for immunization: Secondary | ICD-10-CM | POA: Diagnosis not present

## 2012-05-20 DIAGNOSIS — N2581 Secondary hyperparathyroidism of renal origin: Secondary | ICD-10-CM | POA: Diagnosis not present

## 2012-05-20 DIAGNOSIS — Z23 Encounter for immunization: Secondary | ICD-10-CM | POA: Diagnosis not present

## 2012-05-20 DIAGNOSIS — D509 Iron deficiency anemia, unspecified: Secondary | ICD-10-CM | POA: Diagnosis not present

## 2012-05-20 DIAGNOSIS — N186 End stage renal disease: Secondary | ICD-10-CM | POA: Diagnosis not present

## 2012-05-21 DIAGNOSIS — N186 End stage renal disease: Secondary | ICD-10-CM | POA: Diagnosis not present

## 2012-05-21 DIAGNOSIS — Z23 Encounter for immunization: Secondary | ICD-10-CM | POA: Diagnosis not present

## 2012-05-21 DIAGNOSIS — N2581 Secondary hyperparathyroidism of renal origin: Secondary | ICD-10-CM | POA: Diagnosis not present

## 2012-05-21 DIAGNOSIS — D509 Iron deficiency anemia, unspecified: Secondary | ICD-10-CM | POA: Diagnosis not present

## 2012-05-22 DIAGNOSIS — Z23 Encounter for immunization: Secondary | ICD-10-CM | POA: Diagnosis not present

## 2012-05-22 DIAGNOSIS — N2581 Secondary hyperparathyroidism of renal origin: Secondary | ICD-10-CM | POA: Diagnosis not present

## 2012-05-22 DIAGNOSIS — N186 End stage renal disease: Secondary | ICD-10-CM | POA: Diagnosis not present

## 2012-05-22 DIAGNOSIS — D509 Iron deficiency anemia, unspecified: Secondary | ICD-10-CM | POA: Diagnosis not present

## 2012-05-23 DIAGNOSIS — N2581 Secondary hyperparathyroidism of renal origin: Secondary | ICD-10-CM | POA: Diagnosis not present

## 2012-05-23 DIAGNOSIS — N186 End stage renal disease: Secondary | ICD-10-CM | POA: Diagnosis not present

## 2012-05-23 DIAGNOSIS — Z23 Encounter for immunization: Secondary | ICD-10-CM | POA: Diagnosis not present

## 2012-05-23 DIAGNOSIS — D509 Iron deficiency anemia, unspecified: Secondary | ICD-10-CM | POA: Diagnosis not present

## 2012-05-24 DIAGNOSIS — Z23 Encounter for immunization: Secondary | ICD-10-CM | POA: Diagnosis not present

## 2012-05-24 DIAGNOSIS — N186 End stage renal disease: Secondary | ICD-10-CM | POA: Diagnosis not present

## 2012-05-24 DIAGNOSIS — N2581 Secondary hyperparathyroidism of renal origin: Secondary | ICD-10-CM | POA: Diagnosis not present

## 2012-05-24 DIAGNOSIS — D509 Iron deficiency anemia, unspecified: Secondary | ICD-10-CM | POA: Diagnosis not present

## 2012-05-25 DIAGNOSIS — Z23 Encounter for immunization: Secondary | ICD-10-CM | POA: Diagnosis not present

## 2012-05-25 DIAGNOSIS — D509 Iron deficiency anemia, unspecified: Secondary | ICD-10-CM | POA: Diagnosis not present

## 2012-05-25 DIAGNOSIS — N2581 Secondary hyperparathyroidism of renal origin: Secondary | ICD-10-CM | POA: Diagnosis not present

## 2012-05-25 DIAGNOSIS — N186 End stage renal disease: Secondary | ICD-10-CM | POA: Diagnosis not present

## 2012-05-26 DIAGNOSIS — N186 End stage renal disease: Secondary | ICD-10-CM | POA: Diagnosis not present

## 2012-05-26 DIAGNOSIS — D509 Iron deficiency anemia, unspecified: Secondary | ICD-10-CM | POA: Diagnosis not present

## 2012-05-26 DIAGNOSIS — N2581 Secondary hyperparathyroidism of renal origin: Secondary | ICD-10-CM | POA: Diagnosis not present

## 2012-05-26 DIAGNOSIS — Z23 Encounter for immunization: Secondary | ICD-10-CM | POA: Diagnosis not present

## 2012-05-27 DIAGNOSIS — Z23 Encounter for immunization: Secondary | ICD-10-CM | POA: Diagnosis not present

## 2012-05-27 DIAGNOSIS — N186 End stage renal disease: Secondary | ICD-10-CM | POA: Diagnosis not present

## 2012-05-27 DIAGNOSIS — N2581 Secondary hyperparathyroidism of renal origin: Secondary | ICD-10-CM | POA: Diagnosis not present

## 2012-05-27 DIAGNOSIS — D509 Iron deficiency anemia, unspecified: Secondary | ICD-10-CM | POA: Diagnosis not present

## 2012-05-28 DIAGNOSIS — Z23 Encounter for immunization: Secondary | ICD-10-CM | POA: Diagnosis not present

## 2012-05-28 DIAGNOSIS — D509 Iron deficiency anemia, unspecified: Secondary | ICD-10-CM | POA: Diagnosis not present

## 2012-05-28 DIAGNOSIS — N186 End stage renal disease: Secondary | ICD-10-CM | POA: Diagnosis not present

## 2012-05-28 DIAGNOSIS — N2581 Secondary hyperparathyroidism of renal origin: Secondary | ICD-10-CM | POA: Diagnosis not present

## 2012-05-29 DIAGNOSIS — D509 Iron deficiency anemia, unspecified: Secondary | ICD-10-CM | POA: Diagnosis not present

## 2012-05-29 DIAGNOSIS — N186 End stage renal disease: Secondary | ICD-10-CM | POA: Diagnosis not present

## 2012-05-30 DIAGNOSIS — D509 Iron deficiency anemia, unspecified: Secondary | ICD-10-CM | POA: Diagnosis not present

## 2012-05-30 DIAGNOSIS — N186 End stage renal disease: Secondary | ICD-10-CM | POA: Diagnosis not present

## 2012-05-31 DIAGNOSIS — N186 End stage renal disease: Secondary | ICD-10-CM | POA: Diagnosis not present

## 2012-05-31 DIAGNOSIS — D509 Iron deficiency anemia, unspecified: Secondary | ICD-10-CM | POA: Diagnosis not present

## 2012-06-01 DIAGNOSIS — E1139 Type 2 diabetes mellitus with other diabetic ophthalmic complication: Secondary | ICD-10-CM | POA: Diagnosis not present

## 2012-06-01 DIAGNOSIS — D509 Iron deficiency anemia, unspecified: Secondary | ICD-10-CM | POA: Diagnosis not present

## 2012-06-01 DIAGNOSIS — N186 End stage renal disease: Secondary | ICD-10-CM | POA: Diagnosis not present

## 2012-06-02 DIAGNOSIS — N186 End stage renal disease: Secondary | ICD-10-CM | POA: Diagnosis not present

## 2012-06-02 DIAGNOSIS — D509 Iron deficiency anemia, unspecified: Secondary | ICD-10-CM | POA: Diagnosis not present

## 2012-06-03 DIAGNOSIS — D509 Iron deficiency anemia, unspecified: Secondary | ICD-10-CM | POA: Diagnosis not present

## 2012-06-03 DIAGNOSIS — N186 End stage renal disease: Secondary | ICD-10-CM | POA: Diagnosis not present

## 2012-06-04 DIAGNOSIS — N186 End stage renal disease: Secondary | ICD-10-CM | POA: Diagnosis not present

## 2012-06-04 DIAGNOSIS — D509 Iron deficiency anemia, unspecified: Secondary | ICD-10-CM | POA: Diagnosis not present

## 2012-06-05 DIAGNOSIS — N186 End stage renal disease: Secondary | ICD-10-CM | POA: Diagnosis not present

## 2012-06-05 DIAGNOSIS — D509 Iron deficiency anemia, unspecified: Secondary | ICD-10-CM | POA: Diagnosis not present

## 2012-06-06 DIAGNOSIS — N186 End stage renal disease: Secondary | ICD-10-CM | POA: Diagnosis not present

## 2012-06-06 DIAGNOSIS — D509 Iron deficiency anemia, unspecified: Secondary | ICD-10-CM | POA: Diagnosis not present

## 2012-06-07 DIAGNOSIS — D509 Iron deficiency anemia, unspecified: Secondary | ICD-10-CM | POA: Diagnosis not present

## 2012-06-07 DIAGNOSIS — N186 End stage renal disease: Secondary | ICD-10-CM | POA: Diagnosis not present

## 2012-06-08 DIAGNOSIS — N186 End stage renal disease: Secondary | ICD-10-CM | POA: Diagnosis not present

## 2012-06-08 DIAGNOSIS — D509 Iron deficiency anemia, unspecified: Secondary | ICD-10-CM | POA: Diagnosis not present

## 2012-06-09 DIAGNOSIS — N186 End stage renal disease: Secondary | ICD-10-CM | POA: Diagnosis not present

## 2012-06-09 DIAGNOSIS — D509 Iron deficiency anemia, unspecified: Secondary | ICD-10-CM | POA: Diagnosis not present

## 2012-06-10 DIAGNOSIS — N186 End stage renal disease: Secondary | ICD-10-CM | POA: Diagnosis not present

## 2012-06-10 DIAGNOSIS — D509 Iron deficiency anemia, unspecified: Secondary | ICD-10-CM | POA: Diagnosis not present

## 2012-06-11 DIAGNOSIS — D509 Iron deficiency anemia, unspecified: Secondary | ICD-10-CM | POA: Diagnosis not present

## 2012-06-11 DIAGNOSIS — N186 End stage renal disease: Secondary | ICD-10-CM | POA: Diagnosis not present

## 2012-06-12 DIAGNOSIS — D509 Iron deficiency anemia, unspecified: Secondary | ICD-10-CM | POA: Diagnosis not present

## 2012-06-12 DIAGNOSIS — N186 End stage renal disease: Secondary | ICD-10-CM | POA: Diagnosis not present

## 2012-06-13 DIAGNOSIS — D509 Iron deficiency anemia, unspecified: Secondary | ICD-10-CM | POA: Diagnosis not present

## 2012-06-13 DIAGNOSIS — N186 End stage renal disease: Secondary | ICD-10-CM | POA: Diagnosis not present

## 2012-06-14 DIAGNOSIS — N186 End stage renal disease: Secondary | ICD-10-CM | POA: Diagnosis not present

## 2012-06-14 DIAGNOSIS — D509 Iron deficiency anemia, unspecified: Secondary | ICD-10-CM | POA: Diagnosis not present

## 2012-06-15 DIAGNOSIS — D509 Iron deficiency anemia, unspecified: Secondary | ICD-10-CM | POA: Diagnosis not present

## 2012-06-15 DIAGNOSIS — N186 End stage renal disease: Secondary | ICD-10-CM | POA: Diagnosis not present

## 2012-06-16 DIAGNOSIS — D509 Iron deficiency anemia, unspecified: Secondary | ICD-10-CM | POA: Diagnosis not present

## 2012-06-16 DIAGNOSIS — N186 End stage renal disease: Secondary | ICD-10-CM | POA: Diagnosis not present

## 2012-06-17 DIAGNOSIS — D509 Iron deficiency anemia, unspecified: Secondary | ICD-10-CM | POA: Diagnosis not present

## 2012-06-17 DIAGNOSIS — N186 End stage renal disease: Secondary | ICD-10-CM | POA: Diagnosis not present

## 2012-06-18 DIAGNOSIS — D509 Iron deficiency anemia, unspecified: Secondary | ICD-10-CM | POA: Diagnosis not present

## 2012-06-18 DIAGNOSIS — N186 End stage renal disease: Secondary | ICD-10-CM | POA: Diagnosis not present

## 2012-06-19 DIAGNOSIS — D509 Iron deficiency anemia, unspecified: Secondary | ICD-10-CM | POA: Diagnosis not present

## 2012-06-19 DIAGNOSIS — N186 End stage renal disease: Secondary | ICD-10-CM | POA: Diagnosis not present

## 2012-06-20 DIAGNOSIS — N186 End stage renal disease: Secondary | ICD-10-CM | POA: Diagnosis not present

## 2012-06-20 DIAGNOSIS — D509 Iron deficiency anemia, unspecified: Secondary | ICD-10-CM | POA: Diagnosis not present

## 2012-06-21 DIAGNOSIS — D509 Iron deficiency anemia, unspecified: Secondary | ICD-10-CM | POA: Diagnosis not present

## 2012-06-21 DIAGNOSIS — N186 End stage renal disease: Secondary | ICD-10-CM | POA: Diagnosis not present

## 2012-06-22 DIAGNOSIS — N186 End stage renal disease: Secondary | ICD-10-CM | POA: Diagnosis not present

## 2012-06-22 DIAGNOSIS — D509 Iron deficiency anemia, unspecified: Secondary | ICD-10-CM | POA: Diagnosis not present

## 2012-06-23 DIAGNOSIS — D509 Iron deficiency anemia, unspecified: Secondary | ICD-10-CM | POA: Diagnosis not present

## 2012-06-23 DIAGNOSIS — N186 End stage renal disease: Secondary | ICD-10-CM | POA: Diagnosis not present

## 2012-06-24 DIAGNOSIS — N186 End stage renal disease: Secondary | ICD-10-CM | POA: Diagnosis not present

## 2012-06-24 DIAGNOSIS — D509 Iron deficiency anemia, unspecified: Secondary | ICD-10-CM | POA: Diagnosis not present

## 2012-06-25 DIAGNOSIS — D509 Iron deficiency anemia, unspecified: Secondary | ICD-10-CM | POA: Diagnosis not present

## 2012-06-25 DIAGNOSIS — N186 End stage renal disease: Secondary | ICD-10-CM | POA: Diagnosis not present

## 2012-06-26 DIAGNOSIS — D509 Iron deficiency anemia, unspecified: Secondary | ICD-10-CM | POA: Diagnosis not present

## 2012-06-26 DIAGNOSIS — N186 End stage renal disease: Secondary | ICD-10-CM | POA: Diagnosis not present

## 2012-06-27 DIAGNOSIS — N186 End stage renal disease: Secondary | ICD-10-CM | POA: Diagnosis not present

## 2012-06-27 DIAGNOSIS — D509 Iron deficiency anemia, unspecified: Secondary | ICD-10-CM | POA: Diagnosis not present

## 2012-06-28 DIAGNOSIS — D509 Iron deficiency anemia, unspecified: Secondary | ICD-10-CM | POA: Diagnosis not present

## 2012-06-28 DIAGNOSIS — N186 End stage renal disease: Secondary | ICD-10-CM | POA: Diagnosis not present

## 2012-07-03 DIAGNOSIS — E1139 Type 2 diabetes mellitus with other diabetic ophthalmic complication: Secondary | ICD-10-CM | POA: Diagnosis not present

## 2012-07-18 DIAGNOSIS — Z98891 History of uterine scar from previous surgery: Secondary | ICD-10-CM | POA: Insufficient documentation

## 2012-07-18 DIAGNOSIS — N186 End stage renal disease: Secondary | ICD-10-CM | POA: Insufficient documentation

## 2012-07-18 DIAGNOSIS — I10 Essential (primary) hypertension: Secondary | ICD-10-CM | POA: Insufficient documentation

## 2012-07-18 DIAGNOSIS — Z5181 Encounter for therapeutic drug level monitoring: Secondary | ICD-10-CM | POA: Insufficient documentation

## 2012-07-28 DIAGNOSIS — N186 End stage renal disease: Secondary | ICD-10-CM | POA: Diagnosis not present

## 2012-07-29 DIAGNOSIS — N186 End stage renal disease: Secondary | ICD-10-CM | POA: Diagnosis not present

## 2012-07-29 DIAGNOSIS — D509 Iron deficiency anemia, unspecified: Secondary | ICD-10-CM | POA: Diagnosis not present

## 2012-07-30 DIAGNOSIS — Z0000 Encounter for general adult medical examination without abnormal findings: Secondary | ICD-10-CM | POA: Diagnosis not present

## 2012-07-30 DIAGNOSIS — Z124 Encounter for screening for malignant neoplasm of cervix: Secondary | ICD-10-CM | POA: Diagnosis not present

## 2012-07-30 DIAGNOSIS — Z01419 Encounter for gynecological examination (general) (routine) without abnormal findings: Secondary | ICD-10-CM | POA: Diagnosis not present

## 2012-07-30 DIAGNOSIS — Z130 Encounter for screening for diseases of the blood and blood-forming organs and certain disorders involving the immune mechanism: Secondary | ICD-10-CM | POA: Diagnosis not present

## 2012-07-30 DIAGNOSIS — Z1212 Encounter for screening for malignant neoplasm of rectum: Secondary | ICD-10-CM | POA: Diagnosis not present

## 2012-07-30 DIAGNOSIS — Z1231 Encounter for screening mammogram for malignant neoplasm of breast: Secondary | ICD-10-CM | POA: Diagnosis not present

## 2012-07-30 DIAGNOSIS — N186 End stage renal disease: Secondary | ICD-10-CM | POA: Diagnosis not present

## 2012-07-30 DIAGNOSIS — D509 Iron deficiency anemia, unspecified: Secondary | ICD-10-CM | POA: Diagnosis not present

## 2012-07-31 DIAGNOSIS — N186 End stage renal disease: Secondary | ICD-10-CM | POA: Diagnosis not present

## 2012-07-31 DIAGNOSIS — D509 Iron deficiency anemia, unspecified: Secondary | ICD-10-CM | POA: Diagnosis not present

## 2012-08-01 DIAGNOSIS — N186 End stage renal disease: Secondary | ICD-10-CM | POA: Diagnosis not present

## 2012-08-01 DIAGNOSIS — D509 Iron deficiency anemia, unspecified: Secondary | ICD-10-CM | POA: Diagnosis not present

## 2012-08-01 DIAGNOSIS — Z01810 Encounter for preprocedural cardiovascular examination: Secondary | ICD-10-CM | POA: Diagnosis not present

## 2012-08-01 DIAGNOSIS — I4949 Other premature depolarization: Secondary | ICD-10-CM | POA: Diagnosis not present

## 2012-08-01 DIAGNOSIS — I517 Cardiomegaly: Secondary | ICD-10-CM | POA: Diagnosis not present

## 2012-08-02 DIAGNOSIS — N186 End stage renal disease: Secondary | ICD-10-CM | POA: Diagnosis not present

## 2012-08-02 DIAGNOSIS — D509 Iron deficiency anemia, unspecified: Secondary | ICD-10-CM | POA: Diagnosis not present

## 2012-08-03 DIAGNOSIS — N186 End stage renal disease: Secondary | ICD-10-CM | POA: Diagnosis not present

## 2012-08-03 DIAGNOSIS — D509 Iron deficiency anemia, unspecified: Secondary | ICD-10-CM | POA: Diagnosis not present

## 2012-08-04 DIAGNOSIS — D509 Iron deficiency anemia, unspecified: Secondary | ICD-10-CM | POA: Diagnosis not present

## 2012-08-04 DIAGNOSIS — N186 End stage renal disease: Secondary | ICD-10-CM | POA: Diagnosis not present

## 2012-08-05 DIAGNOSIS — N186 End stage renal disease: Secondary | ICD-10-CM | POA: Diagnosis not present

## 2012-08-05 DIAGNOSIS — D509 Iron deficiency anemia, unspecified: Secondary | ICD-10-CM | POA: Diagnosis not present

## 2012-08-06 DIAGNOSIS — N186 End stage renal disease: Secondary | ICD-10-CM | POA: Diagnosis not present

## 2012-08-06 DIAGNOSIS — D509 Iron deficiency anemia, unspecified: Secondary | ICD-10-CM | POA: Diagnosis not present

## 2012-08-07 DIAGNOSIS — D509 Iron deficiency anemia, unspecified: Secondary | ICD-10-CM | POA: Diagnosis not present

## 2012-08-07 DIAGNOSIS — N186 End stage renal disease: Secondary | ICD-10-CM | POA: Diagnosis not present

## 2012-08-08 DIAGNOSIS — D509 Iron deficiency anemia, unspecified: Secondary | ICD-10-CM | POA: Diagnosis not present

## 2012-08-08 DIAGNOSIS — N186 End stage renal disease: Secondary | ICD-10-CM | POA: Diagnosis not present

## 2012-08-09 DIAGNOSIS — D509 Iron deficiency anemia, unspecified: Secondary | ICD-10-CM | POA: Diagnosis not present

## 2012-08-09 DIAGNOSIS — N186 End stage renal disease: Secondary | ICD-10-CM | POA: Diagnosis not present

## 2012-08-10 DIAGNOSIS — N186 End stage renal disease: Secondary | ICD-10-CM | POA: Diagnosis not present

## 2012-08-10 DIAGNOSIS — D509 Iron deficiency anemia, unspecified: Secondary | ICD-10-CM | POA: Diagnosis not present

## 2012-08-11 DIAGNOSIS — N186 End stage renal disease: Secondary | ICD-10-CM | POA: Diagnosis not present

## 2012-08-11 DIAGNOSIS — D509 Iron deficiency anemia, unspecified: Secondary | ICD-10-CM | POA: Diagnosis not present

## 2012-08-12 DIAGNOSIS — N186 End stage renal disease: Secondary | ICD-10-CM | POA: Diagnosis not present

## 2012-08-12 DIAGNOSIS — D509 Iron deficiency anemia, unspecified: Secondary | ICD-10-CM | POA: Diagnosis not present

## 2012-08-13 DIAGNOSIS — N186 End stage renal disease: Secondary | ICD-10-CM | POA: Diagnosis not present

## 2012-08-13 DIAGNOSIS — D509 Iron deficiency anemia, unspecified: Secondary | ICD-10-CM | POA: Diagnosis not present

## 2012-08-14 DIAGNOSIS — N186 End stage renal disease: Secondary | ICD-10-CM | POA: Diagnosis not present

## 2012-08-14 DIAGNOSIS — D509 Iron deficiency anemia, unspecified: Secondary | ICD-10-CM | POA: Diagnosis not present

## 2012-08-15 DIAGNOSIS — D509 Iron deficiency anemia, unspecified: Secondary | ICD-10-CM | POA: Diagnosis not present

## 2012-08-15 DIAGNOSIS — N186 End stage renal disease: Secondary | ICD-10-CM | POA: Diagnosis not present

## 2012-08-16 DIAGNOSIS — D509 Iron deficiency anemia, unspecified: Secondary | ICD-10-CM | POA: Diagnosis not present

## 2012-08-16 DIAGNOSIS — N186 End stage renal disease: Secondary | ICD-10-CM | POA: Diagnosis not present

## 2012-08-17 DIAGNOSIS — D509 Iron deficiency anemia, unspecified: Secondary | ICD-10-CM | POA: Diagnosis not present

## 2012-08-17 DIAGNOSIS — N186 End stage renal disease: Secondary | ICD-10-CM | POA: Diagnosis not present

## 2012-08-18 DIAGNOSIS — N186 End stage renal disease: Secondary | ICD-10-CM | POA: Diagnosis not present

## 2012-08-18 DIAGNOSIS — D509 Iron deficiency anemia, unspecified: Secondary | ICD-10-CM | POA: Diagnosis not present

## 2012-08-19 DIAGNOSIS — N186 End stage renal disease: Secondary | ICD-10-CM | POA: Diagnosis not present

## 2012-08-19 DIAGNOSIS — D509 Iron deficiency anemia, unspecified: Secondary | ICD-10-CM | POA: Diagnosis not present

## 2012-08-20 DIAGNOSIS — N186 End stage renal disease: Secondary | ICD-10-CM | POA: Diagnosis not present

## 2012-08-20 DIAGNOSIS — D509 Iron deficiency anemia, unspecified: Secondary | ICD-10-CM | POA: Diagnosis not present

## 2012-08-21 DIAGNOSIS — N186 End stage renal disease: Secondary | ICD-10-CM | POA: Diagnosis not present

## 2012-08-21 DIAGNOSIS — D509 Iron deficiency anemia, unspecified: Secondary | ICD-10-CM | POA: Diagnosis not present

## 2012-08-22 DIAGNOSIS — D509 Iron deficiency anemia, unspecified: Secondary | ICD-10-CM | POA: Diagnosis not present

## 2012-08-22 DIAGNOSIS — N186 End stage renal disease: Secondary | ICD-10-CM | POA: Diagnosis not present

## 2012-08-23 DIAGNOSIS — N186 End stage renal disease: Secondary | ICD-10-CM | POA: Diagnosis not present

## 2012-08-23 DIAGNOSIS — D509 Iron deficiency anemia, unspecified: Secondary | ICD-10-CM | POA: Diagnosis not present

## 2012-08-24 DIAGNOSIS — D509 Iron deficiency anemia, unspecified: Secondary | ICD-10-CM | POA: Diagnosis not present

## 2012-08-24 DIAGNOSIS — N186 End stage renal disease: Secondary | ICD-10-CM | POA: Diagnosis not present

## 2012-08-25 DIAGNOSIS — N186 End stage renal disease: Secondary | ICD-10-CM | POA: Diagnosis not present

## 2012-08-25 DIAGNOSIS — D509 Iron deficiency anemia, unspecified: Secondary | ICD-10-CM | POA: Diagnosis not present

## 2012-08-26 DIAGNOSIS — D509 Iron deficiency anemia, unspecified: Secondary | ICD-10-CM | POA: Diagnosis not present

## 2012-08-26 DIAGNOSIS — N186 End stage renal disease: Secondary | ICD-10-CM | POA: Diagnosis not present

## 2012-08-27 DIAGNOSIS — D509 Iron deficiency anemia, unspecified: Secondary | ICD-10-CM | POA: Diagnosis not present

## 2012-08-27 DIAGNOSIS — N186 End stage renal disease: Secondary | ICD-10-CM | POA: Diagnosis not present

## 2012-08-28 DIAGNOSIS — Z23 Encounter for immunization: Secondary | ICD-10-CM | POA: Diagnosis not present

## 2012-08-28 DIAGNOSIS — D509 Iron deficiency anemia, unspecified: Secondary | ICD-10-CM | POA: Diagnosis not present

## 2012-08-28 DIAGNOSIS — N186 End stage renal disease: Secondary | ICD-10-CM | POA: Diagnosis not present

## 2012-09-04 DIAGNOSIS — Z1211 Encounter for screening for malignant neoplasm of colon: Secondary | ICD-10-CM | POA: Diagnosis not present

## 2012-09-27 DIAGNOSIS — N186 End stage renal disease: Secondary | ICD-10-CM | POA: Diagnosis not present

## 2012-09-28 DIAGNOSIS — N2581 Secondary hyperparathyroidism of renal origin: Secondary | ICD-10-CM | POA: Diagnosis not present

## 2012-09-28 DIAGNOSIS — N186 End stage renal disease: Secondary | ICD-10-CM | POA: Diagnosis not present

## 2012-09-28 DIAGNOSIS — D509 Iron deficiency anemia, unspecified: Secondary | ICD-10-CM | POA: Diagnosis not present

## 2012-09-29 DIAGNOSIS — N186 End stage renal disease: Secondary | ICD-10-CM | POA: Diagnosis not present

## 2012-09-29 DIAGNOSIS — D509 Iron deficiency anemia, unspecified: Secondary | ICD-10-CM | POA: Diagnosis not present

## 2012-09-29 DIAGNOSIS — N2581 Secondary hyperparathyroidism of renal origin: Secondary | ICD-10-CM | POA: Diagnosis not present

## 2012-09-30 DIAGNOSIS — N2581 Secondary hyperparathyroidism of renal origin: Secondary | ICD-10-CM | POA: Diagnosis not present

## 2012-09-30 DIAGNOSIS — N186 End stage renal disease: Secondary | ICD-10-CM | POA: Diagnosis not present

## 2012-09-30 DIAGNOSIS — D509 Iron deficiency anemia, unspecified: Secondary | ICD-10-CM | POA: Diagnosis not present

## 2012-10-01 DIAGNOSIS — N186 End stage renal disease: Secondary | ICD-10-CM | POA: Diagnosis not present

## 2012-10-01 DIAGNOSIS — D509 Iron deficiency anemia, unspecified: Secondary | ICD-10-CM | POA: Diagnosis not present

## 2012-10-01 DIAGNOSIS — N2581 Secondary hyperparathyroidism of renal origin: Secondary | ICD-10-CM | POA: Diagnosis not present

## 2012-10-02 DIAGNOSIS — N2581 Secondary hyperparathyroidism of renal origin: Secondary | ICD-10-CM | POA: Diagnosis not present

## 2012-10-02 DIAGNOSIS — D509 Iron deficiency anemia, unspecified: Secondary | ICD-10-CM | POA: Diagnosis not present

## 2012-10-02 DIAGNOSIS — N186 End stage renal disease: Secondary | ICD-10-CM | POA: Diagnosis not present

## 2012-10-03 DIAGNOSIS — D509 Iron deficiency anemia, unspecified: Secondary | ICD-10-CM | POA: Diagnosis not present

## 2012-10-03 DIAGNOSIS — N2581 Secondary hyperparathyroidism of renal origin: Secondary | ICD-10-CM | POA: Diagnosis not present

## 2012-10-03 DIAGNOSIS — N186 End stage renal disease: Secondary | ICD-10-CM | POA: Diagnosis not present

## 2012-10-04 DIAGNOSIS — N186 End stage renal disease: Secondary | ICD-10-CM | POA: Diagnosis not present

## 2012-10-04 DIAGNOSIS — D509 Iron deficiency anemia, unspecified: Secondary | ICD-10-CM | POA: Diagnosis not present

## 2012-10-04 DIAGNOSIS — N2581 Secondary hyperparathyroidism of renal origin: Secondary | ICD-10-CM | POA: Diagnosis not present

## 2012-10-05 DIAGNOSIS — N186 End stage renal disease: Secondary | ICD-10-CM | POA: Diagnosis not present

## 2012-10-05 DIAGNOSIS — N2581 Secondary hyperparathyroidism of renal origin: Secondary | ICD-10-CM | POA: Diagnosis not present

## 2012-10-05 DIAGNOSIS — D509 Iron deficiency anemia, unspecified: Secondary | ICD-10-CM | POA: Diagnosis not present

## 2012-10-06 DIAGNOSIS — N2581 Secondary hyperparathyroidism of renal origin: Secondary | ICD-10-CM | POA: Diagnosis not present

## 2012-10-06 DIAGNOSIS — N186 End stage renal disease: Secondary | ICD-10-CM | POA: Diagnosis not present

## 2012-10-06 DIAGNOSIS — D509 Iron deficiency anemia, unspecified: Secondary | ICD-10-CM | POA: Diagnosis not present

## 2012-10-07 DIAGNOSIS — N186 End stage renal disease: Secondary | ICD-10-CM | POA: Diagnosis not present

## 2012-10-07 DIAGNOSIS — D509 Iron deficiency anemia, unspecified: Secondary | ICD-10-CM | POA: Diagnosis not present

## 2012-10-07 DIAGNOSIS — N2581 Secondary hyperparathyroidism of renal origin: Secondary | ICD-10-CM | POA: Diagnosis not present

## 2012-10-08 DIAGNOSIS — N186 End stage renal disease: Secondary | ICD-10-CM | POA: Diagnosis not present

## 2012-10-08 DIAGNOSIS — N2581 Secondary hyperparathyroidism of renal origin: Secondary | ICD-10-CM | POA: Diagnosis not present

## 2012-10-08 DIAGNOSIS — D509 Iron deficiency anemia, unspecified: Secondary | ICD-10-CM | POA: Diagnosis not present

## 2012-10-09 DIAGNOSIS — D509 Iron deficiency anemia, unspecified: Secondary | ICD-10-CM | POA: Diagnosis not present

## 2012-10-09 DIAGNOSIS — N186 End stage renal disease: Secondary | ICD-10-CM | POA: Diagnosis not present

## 2012-10-09 DIAGNOSIS — N2581 Secondary hyperparathyroidism of renal origin: Secondary | ICD-10-CM | POA: Diagnosis not present

## 2012-10-10 DIAGNOSIS — N2581 Secondary hyperparathyroidism of renal origin: Secondary | ICD-10-CM | POA: Diagnosis not present

## 2012-10-10 DIAGNOSIS — N186 End stage renal disease: Secondary | ICD-10-CM | POA: Diagnosis not present

## 2012-10-10 DIAGNOSIS — D509 Iron deficiency anemia, unspecified: Secondary | ICD-10-CM | POA: Diagnosis not present

## 2012-10-11 DIAGNOSIS — N2581 Secondary hyperparathyroidism of renal origin: Secondary | ICD-10-CM | POA: Diagnosis not present

## 2012-10-11 DIAGNOSIS — N186 End stage renal disease: Secondary | ICD-10-CM | POA: Diagnosis not present

## 2012-10-11 DIAGNOSIS — D509 Iron deficiency anemia, unspecified: Secondary | ICD-10-CM | POA: Diagnosis not present

## 2012-10-12 DIAGNOSIS — N2581 Secondary hyperparathyroidism of renal origin: Secondary | ICD-10-CM | POA: Diagnosis not present

## 2012-10-12 DIAGNOSIS — N186 End stage renal disease: Secondary | ICD-10-CM | POA: Diagnosis not present

## 2012-10-12 DIAGNOSIS — D509 Iron deficiency anemia, unspecified: Secondary | ICD-10-CM | POA: Diagnosis not present

## 2012-10-13 DIAGNOSIS — D509 Iron deficiency anemia, unspecified: Secondary | ICD-10-CM | POA: Diagnosis not present

## 2012-10-13 DIAGNOSIS — N186 End stage renal disease: Secondary | ICD-10-CM | POA: Diagnosis not present

## 2012-10-13 DIAGNOSIS — N2581 Secondary hyperparathyroidism of renal origin: Secondary | ICD-10-CM | POA: Diagnosis not present

## 2012-10-14 DIAGNOSIS — D509 Iron deficiency anemia, unspecified: Secondary | ICD-10-CM | POA: Diagnosis not present

## 2012-10-14 DIAGNOSIS — N186 End stage renal disease: Secondary | ICD-10-CM | POA: Diagnosis not present

## 2012-10-14 DIAGNOSIS — N2581 Secondary hyperparathyroidism of renal origin: Secondary | ICD-10-CM | POA: Diagnosis not present

## 2012-10-15 DIAGNOSIS — K648 Other hemorrhoids: Secondary | ICD-10-CM | POA: Diagnosis not present

## 2012-10-15 DIAGNOSIS — K5730 Diverticulosis of large intestine without perforation or abscess without bleeding: Secondary | ICD-10-CM | POA: Diagnosis not present

## 2012-10-15 DIAGNOSIS — N2581 Secondary hyperparathyroidism of renal origin: Secondary | ICD-10-CM | POA: Diagnosis not present

## 2012-10-15 DIAGNOSIS — Z79899 Other long term (current) drug therapy: Secondary | ICD-10-CM | POA: Diagnosis not present

## 2012-10-15 DIAGNOSIS — E7800 Pure hypercholesterolemia, unspecified: Secondary | ICD-10-CM | POA: Diagnosis not present

## 2012-10-15 DIAGNOSIS — N189 Chronic kidney disease, unspecified: Secondary | ICD-10-CM | POA: Diagnosis not present

## 2012-10-15 DIAGNOSIS — I129 Hypertensive chronic kidney disease with stage 1 through stage 4 chronic kidney disease, or unspecified chronic kidney disease: Secondary | ICD-10-CM | POA: Diagnosis not present

## 2012-10-15 DIAGNOSIS — D509 Iron deficiency anemia, unspecified: Secondary | ICD-10-CM | POA: Diagnosis not present

## 2012-10-15 DIAGNOSIS — N186 End stage renal disease: Secondary | ICD-10-CM | POA: Diagnosis not present

## 2012-10-15 DIAGNOSIS — Z1211 Encounter for screening for malignant neoplasm of colon: Secondary | ICD-10-CM | POA: Diagnosis not present

## 2012-10-15 DIAGNOSIS — Z7982 Long term (current) use of aspirin: Secondary | ICD-10-CM | POA: Diagnosis not present

## 2012-10-16 DIAGNOSIS — N2581 Secondary hyperparathyroidism of renal origin: Secondary | ICD-10-CM | POA: Diagnosis not present

## 2012-10-16 DIAGNOSIS — N186 End stage renal disease: Secondary | ICD-10-CM | POA: Diagnosis not present

## 2012-10-16 DIAGNOSIS — D509 Iron deficiency anemia, unspecified: Secondary | ICD-10-CM | POA: Diagnosis not present

## 2012-10-17 DIAGNOSIS — N2581 Secondary hyperparathyroidism of renal origin: Secondary | ICD-10-CM | POA: Diagnosis not present

## 2012-10-17 DIAGNOSIS — D509 Iron deficiency anemia, unspecified: Secondary | ICD-10-CM | POA: Diagnosis not present

## 2012-10-17 DIAGNOSIS — N186 End stage renal disease: Secondary | ICD-10-CM | POA: Diagnosis not present

## 2012-10-18 DIAGNOSIS — N186 End stage renal disease: Secondary | ICD-10-CM | POA: Diagnosis not present

## 2012-10-18 DIAGNOSIS — N2581 Secondary hyperparathyroidism of renal origin: Secondary | ICD-10-CM | POA: Diagnosis not present

## 2012-10-18 DIAGNOSIS — D509 Iron deficiency anemia, unspecified: Secondary | ICD-10-CM | POA: Diagnosis not present

## 2012-10-19 DIAGNOSIS — N2581 Secondary hyperparathyroidism of renal origin: Secondary | ICD-10-CM | POA: Diagnosis not present

## 2012-10-19 DIAGNOSIS — D509 Iron deficiency anemia, unspecified: Secondary | ICD-10-CM | POA: Diagnosis not present

## 2012-10-19 DIAGNOSIS — N186 End stage renal disease: Secondary | ICD-10-CM | POA: Diagnosis not present

## 2012-10-20 DIAGNOSIS — N2581 Secondary hyperparathyroidism of renal origin: Secondary | ICD-10-CM | POA: Diagnosis not present

## 2012-10-20 DIAGNOSIS — D509 Iron deficiency anemia, unspecified: Secondary | ICD-10-CM | POA: Diagnosis not present

## 2012-10-20 DIAGNOSIS — N186 End stage renal disease: Secondary | ICD-10-CM | POA: Diagnosis not present

## 2012-10-21 DIAGNOSIS — N186 End stage renal disease: Secondary | ICD-10-CM | POA: Diagnosis not present

## 2012-10-21 DIAGNOSIS — N2581 Secondary hyperparathyroidism of renal origin: Secondary | ICD-10-CM | POA: Diagnosis not present

## 2012-10-21 DIAGNOSIS — D509 Iron deficiency anemia, unspecified: Secondary | ICD-10-CM | POA: Diagnosis not present

## 2012-10-22 DIAGNOSIS — N186 End stage renal disease: Secondary | ICD-10-CM | POA: Diagnosis not present

## 2012-10-22 DIAGNOSIS — D509 Iron deficiency anemia, unspecified: Secondary | ICD-10-CM | POA: Diagnosis not present

## 2012-10-22 DIAGNOSIS — N2581 Secondary hyperparathyroidism of renal origin: Secondary | ICD-10-CM | POA: Diagnosis not present

## 2012-10-23 DIAGNOSIS — D509 Iron deficiency anemia, unspecified: Secondary | ICD-10-CM | POA: Diagnosis not present

## 2012-10-23 DIAGNOSIS — N186 End stage renal disease: Secondary | ICD-10-CM | POA: Diagnosis not present

## 2012-10-23 DIAGNOSIS — N2581 Secondary hyperparathyroidism of renal origin: Secondary | ICD-10-CM | POA: Diagnosis not present

## 2012-10-24 DIAGNOSIS — N2581 Secondary hyperparathyroidism of renal origin: Secondary | ICD-10-CM | POA: Diagnosis not present

## 2012-10-24 DIAGNOSIS — N186 End stage renal disease: Secondary | ICD-10-CM | POA: Diagnosis not present

## 2012-10-24 DIAGNOSIS — D509 Iron deficiency anemia, unspecified: Secondary | ICD-10-CM | POA: Diagnosis not present

## 2012-10-25 DIAGNOSIS — D509 Iron deficiency anemia, unspecified: Secondary | ICD-10-CM | POA: Diagnosis not present

## 2012-10-25 DIAGNOSIS — N186 End stage renal disease: Secondary | ICD-10-CM | POA: Diagnosis not present

## 2012-10-25 DIAGNOSIS — N2581 Secondary hyperparathyroidism of renal origin: Secondary | ICD-10-CM | POA: Diagnosis not present

## 2012-10-26 DIAGNOSIS — D509 Iron deficiency anemia, unspecified: Secondary | ICD-10-CM | POA: Diagnosis not present

## 2012-10-26 DIAGNOSIS — N186 End stage renal disease: Secondary | ICD-10-CM | POA: Diagnosis not present

## 2012-10-26 DIAGNOSIS — N2581 Secondary hyperparathyroidism of renal origin: Secondary | ICD-10-CM | POA: Diagnosis not present

## 2012-10-27 DIAGNOSIS — N186 End stage renal disease: Secondary | ICD-10-CM | POA: Diagnosis not present

## 2012-10-27 DIAGNOSIS — D509 Iron deficiency anemia, unspecified: Secondary | ICD-10-CM | POA: Diagnosis not present

## 2012-10-27 DIAGNOSIS — N2581 Secondary hyperparathyroidism of renal origin: Secondary | ICD-10-CM | POA: Diagnosis not present

## 2012-10-28 DIAGNOSIS — N186 End stage renal disease: Secondary | ICD-10-CM | POA: Diagnosis not present

## 2012-10-28 DIAGNOSIS — D509 Iron deficiency anemia, unspecified: Secondary | ICD-10-CM | POA: Diagnosis not present

## 2012-10-28 DIAGNOSIS — N2581 Secondary hyperparathyroidism of renal origin: Secondary | ICD-10-CM | POA: Diagnosis not present

## 2012-10-29 DIAGNOSIS — D539 Nutritional anemia, unspecified: Secondary | ICD-10-CM | POA: Diagnosis not present

## 2012-10-29 DIAGNOSIS — Z79899 Other long term (current) drug therapy: Secondary | ICD-10-CM | POA: Diagnosis not present

## 2012-10-29 DIAGNOSIS — N186 End stage renal disease: Secondary | ICD-10-CM | POA: Diagnosis not present

## 2012-10-29 DIAGNOSIS — D509 Iron deficiency anemia, unspecified: Secondary | ICD-10-CM | POA: Diagnosis not present

## 2012-10-29 DIAGNOSIS — N2581 Secondary hyperparathyroidism of renal origin: Secondary | ICD-10-CM | POA: Diagnosis not present

## 2012-10-30 DIAGNOSIS — D509 Iron deficiency anemia, unspecified: Secondary | ICD-10-CM | POA: Diagnosis not present

## 2012-10-30 DIAGNOSIS — D539 Nutritional anemia, unspecified: Secondary | ICD-10-CM | POA: Diagnosis not present

## 2012-10-30 DIAGNOSIS — Z79899 Other long term (current) drug therapy: Secondary | ICD-10-CM | POA: Diagnosis not present

## 2012-10-30 DIAGNOSIS — N2581 Secondary hyperparathyroidism of renal origin: Secondary | ICD-10-CM | POA: Diagnosis not present

## 2012-10-30 DIAGNOSIS — N186 End stage renal disease: Secondary | ICD-10-CM | POA: Diagnosis not present

## 2012-10-31 DIAGNOSIS — N2581 Secondary hyperparathyroidism of renal origin: Secondary | ICD-10-CM | POA: Diagnosis not present

## 2012-10-31 DIAGNOSIS — D509 Iron deficiency anemia, unspecified: Secondary | ICD-10-CM | POA: Diagnosis not present

## 2012-10-31 DIAGNOSIS — D539 Nutritional anemia, unspecified: Secondary | ICD-10-CM | POA: Diagnosis not present

## 2012-10-31 DIAGNOSIS — N186 End stage renal disease: Secondary | ICD-10-CM | POA: Diagnosis not present

## 2012-10-31 DIAGNOSIS — Z79899 Other long term (current) drug therapy: Secondary | ICD-10-CM | POA: Diagnosis not present

## 2012-11-01 DIAGNOSIS — D509 Iron deficiency anemia, unspecified: Secondary | ICD-10-CM | POA: Diagnosis not present

## 2012-11-01 DIAGNOSIS — N2581 Secondary hyperparathyroidism of renal origin: Secondary | ICD-10-CM | POA: Diagnosis not present

## 2012-11-01 DIAGNOSIS — Z79899 Other long term (current) drug therapy: Secondary | ICD-10-CM | POA: Diagnosis not present

## 2012-11-01 DIAGNOSIS — D539 Nutritional anemia, unspecified: Secondary | ICD-10-CM | POA: Diagnosis not present

## 2012-11-01 DIAGNOSIS — N186 End stage renal disease: Secondary | ICD-10-CM | POA: Diagnosis not present

## 2012-11-02 DIAGNOSIS — Z79899 Other long term (current) drug therapy: Secondary | ICD-10-CM | POA: Diagnosis not present

## 2012-11-02 DIAGNOSIS — N186 End stage renal disease: Secondary | ICD-10-CM | POA: Diagnosis not present

## 2012-11-02 DIAGNOSIS — N2581 Secondary hyperparathyroidism of renal origin: Secondary | ICD-10-CM | POA: Diagnosis not present

## 2012-11-02 DIAGNOSIS — D539 Nutritional anemia, unspecified: Secondary | ICD-10-CM | POA: Diagnosis not present

## 2012-11-02 DIAGNOSIS — D509 Iron deficiency anemia, unspecified: Secondary | ICD-10-CM | POA: Diagnosis not present

## 2012-11-03 DIAGNOSIS — Z79899 Other long term (current) drug therapy: Secondary | ICD-10-CM | POA: Diagnosis not present

## 2012-11-03 DIAGNOSIS — N186 End stage renal disease: Secondary | ICD-10-CM | POA: Diagnosis not present

## 2012-11-03 DIAGNOSIS — D509 Iron deficiency anemia, unspecified: Secondary | ICD-10-CM | POA: Diagnosis not present

## 2012-11-03 DIAGNOSIS — D539 Nutritional anemia, unspecified: Secondary | ICD-10-CM | POA: Diagnosis not present

## 2012-11-03 DIAGNOSIS — N2581 Secondary hyperparathyroidism of renal origin: Secondary | ICD-10-CM | POA: Diagnosis not present

## 2012-11-04 DIAGNOSIS — D539 Nutritional anemia, unspecified: Secondary | ICD-10-CM | POA: Diagnosis not present

## 2012-11-04 DIAGNOSIS — Z79899 Other long term (current) drug therapy: Secondary | ICD-10-CM | POA: Diagnosis not present

## 2012-11-04 DIAGNOSIS — N186 End stage renal disease: Secondary | ICD-10-CM | POA: Diagnosis not present

## 2012-11-04 DIAGNOSIS — N2581 Secondary hyperparathyroidism of renal origin: Secondary | ICD-10-CM | POA: Diagnosis not present

## 2012-11-04 DIAGNOSIS — D509 Iron deficiency anemia, unspecified: Secondary | ICD-10-CM | POA: Diagnosis not present

## 2012-11-05 DIAGNOSIS — D539 Nutritional anemia, unspecified: Secondary | ICD-10-CM | POA: Diagnosis not present

## 2012-11-05 DIAGNOSIS — N186 End stage renal disease: Secondary | ICD-10-CM | POA: Diagnosis not present

## 2012-11-05 DIAGNOSIS — N2581 Secondary hyperparathyroidism of renal origin: Secondary | ICD-10-CM | POA: Diagnosis not present

## 2012-11-05 DIAGNOSIS — Z79899 Other long term (current) drug therapy: Secondary | ICD-10-CM | POA: Diagnosis not present

## 2012-11-05 DIAGNOSIS — D509 Iron deficiency anemia, unspecified: Secondary | ICD-10-CM | POA: Diagnosis not present

## 2012-11-06 DIAGNOSIS — R87615 Unsatisfactory cytologic smear of cervix: Secondary | ICD-10-CM | POA: Diagnosis not present

## 2012-11-06 DIAGNOSIS — N2581 Secondary hyperparathyroidism of renal origin: Secondary | ICD-10-CM | POA: Diagnosis not present

## 2012-11-06 DIAGNOSIS — Z9189 Other specified personal risk factors, not elsewhere classified: Secondary | ICD-10-CM | POA: Diagnosis not present

## 2012-11-06 DIAGNOSIS — D509 Iron deficiency anemia, unspecified: Secondary | ICD-10-CM | POA: Diagnosis not present

## 2012-11-06 DIAGNOSIS — D539 Nutritional anemia, unspecified: Secondary | ICD-10-CM | POA: Diagnosis not present

## 2012-11-06 DIAGNOSIS — Z79899 Other long term (current) drug therapy: Secondary | ICD-10-CM | POA: Diagnosis not present

## 2012-11-06 DIAGNOSIS — N186 End stage renal disease: Secondary | ICD-10-CM | POA: Diagnosis not present

## 2012-11-07 DIAGNOSIS — Z79899 Other long term (current) drug therapy: Secondary | ICD-10-CM | POA: Diagnosis not present

## 2012-11-07 DIAGNOSIS — N2581 Secondary hyperparathyroidism of renal origin: Secondary | ICD-10-CM | POA: Diagnosis not present

## 2012-11-07 DIAGNOSIS — N186 End stage renal disease: Secondary | ICD-10-CM | POA: Diagnosis not present

## 2012-11-07 DIAGNOSIS — D539 Nutritional anemia, unspecified: Secondary | ICD-10-CM | POA: Diagnosis not present

## 2012-11-07 DIAGNOSIS — D509 Iron deficiency anemia, unspecified: Secondary | ICD-10-CM | POA: Diagnosis not present

## 2012-11-08 DIAGNOSIS — N186 End stage renal disease: Secondary | ICD-10-CM | POA: Diagnosis not present

## 2012-11-08 DIAGNOSIS — N2581 Secondary hyperparathyroidism of renal origin: Secondary | ICD-10-CM | POA: Diagnosis not present

## 2012-11-08 DIAGNOSIS — D509 Iron deficiency anemia, unspecified: Secondary | ICD-10-CM | POA: Diagnosis not present

## 2012-11-08 DIAGNOSIS — Z79899 Other long term (current) drug therapy: Secondary | ICD-10-CM | POA: Diagnosis not present

## 2012-11-08 DIAGNOSIS — D539 Nutritional anemia, unspecified: Secondary | ICD-10-CM | POA: Diagnosis not present

## 2012-11-09 DIAGNOSIS — N186 End stage renal disease: Secondary | ICD-10-CM | POA: Diagnosis not present

## 2012-11-09 DIAGNOSIS — D509 Iron deficiency anemia, unspecified: Secondary | ICD-10-CM | POA: Diagnosis not present

## 2012-11-09 DIAGNOSIS — Z79899 Other long term (current) drug therapy: Secondary | ICD-10-CM | POA: Diagnosis not present

## 2012-11-09 DIAGNOSIS — D539 Nutritional anemia, unspecified: Secondary | ICD-10-CM | POA: Diagnosis not present

## 2012-11-09 DIAGNOSIS — N2581 Secondary hyperparathyroidism of renal origin: Secondary | ICD-10-CM | POA: Diagnosis not present

## 2012-11-10 DIAGNOSIS — D539 Nutritional anemia, unspecified: Secondary | ICD-10-CM | POA: Diagnosis not present

## 2012-11-10 DIAGNOSIS — N186 End stage renal disease: Secondary | ICD-10-CM | POA: Diagnosis not present

## 2012-11-10 DIAGNOSIS — D509 Iron deficiency anemia, unspecified: Secondary | ICD-10-CM | POA: Diagnosis not present

## 2012-11-10 DIAGNOSIS — Z79899 Other long term (current) drug therapy: Secondary | ICD-10-CM | POA: Diagnosis not present

## 2012-11-10 DIAGNOSIS — N2581 Secondary hyperparathyroidism of renal origin: Secondary | ICD-10-CM | POA: Diagnosis not present

## 2012-11-11 DIAGNOSIS — Z79899 Other long term (current) drug therapy: Secondary | ICD-10-CM | POA: Diagnosis not present

## 2012-11-11 DIAGNOSIS — D509 Iron deficiency anemia, unspecified: Secondary | ICD-10-CM | POA: Diagnosis not present

## 2012-11-11 DIAGNOSIS — N2581 Secondary hyperparathyroidism of renal origin: Secondary | ICD-10-CM | POA: Diagnosis not present

## 2012-11-11 DIAGNOSIS — D539 Nutritional anemia, unspecified: Secondary | ICD-10-CM | POA: Diagnosis not present

## 2012-11-11 DIAGNOSIS — N186 End stage renal disease: Secondary | ICD-10-CM | POA: Diagnosis not present

## 2012-11-12 DIAGNOSIS — D509 Iron deficiency anemia, unspecified: Secondary | ICD-10-CM | POA: Diagnosis not present

## 2012-11-12 DIAGNOSIS — D539 Nutritional anemia, unspecified: Secondary | ICD-10-CM | POA: Diagnosis not present

## 2012-11-12 DIAGNOSIS — Z79899 Other long term (current) drug therapy: Secondary | ICD-10-CM | POA: Diagnosis not present

## 2012-11-12 DIAGNOSIS — N2581 Secondary hyperparathyroidism of renal origin: Secondary | ICD-10-CM | POA: Diagnosis not present

## 2012-11-12 DIAGNOSIS — N186 End stage renal disease: Secondary | ICD-10-CM | POA: Diagnosis not present

## 2012-11-13 DIAGNOSIS — D509 Iron deficiency anemia, unspecified: Secondary | ICD-10-CM | POA: Diagnosis not present

## 2012-11-13 DIAGNOSIS — N186 End stage renal disease: Secondary | ICD-10-CM | POA: Diagnosis not present

## 2012-11-13 DIAGNOSIS — D539 Nutritional anemia, unspecified: Secondary | ICD-10-CM | POA: Diagnosis not present

## 2012-11-13 DIAGNOSIS — N2581 Secondary hyperparathyroidism of renal origin: Secondary | ICD-10-CM | POA: Diagnosis not present

## 2012-11-13 DIAGNOSIS — Z79899 Other long term (current) drug therapy: Secondary | ICD-10-CM | POA: Diagnosis not present

## 2012-11-14 DIAGNOSIS — D509 Iron deficiency anemia, unspecified: Secondary | ICD-10-CM | POA: Diagnosis not present

## 2012-11-14 DIAGNOSIS — N2581 Secondary hyperparathyroidism of renal origin: Secondary | ICD-10-CM | POA: Diagnosis not present

## 2012-11-14 DIAGNOSIS — D539 Nutritional anemia, unspecified: Secondary | ICD-10-CM | POA: Diagnosis not present

## 2012-11-14 DIAGNOSIS — Z79899 Other long term (current) drug therapy: Secondary | ICD-10-CM | POA: Diagnosis not present

## 2012-11-14 DIAGNOSIS — N186 End stage renal disease: Secondary | ICD-10-CM | POA: Diagnosis not present

## 2012-11-15 DIAGNOSIS — N2581 Secondary hyperparathyroidism of renal origin: Secondary | ICD-10-CM | POA: Diagnosis not present

## 2012-11-15 DIAGNOSIS — N186 End stage renal disease: Secondary | ICD-10-CM | POA: Diagnosis not present

## 2012-11-15 DIAGNOSIS — Z79899 Other long term (current) drug therapy: Secondary | ICD-10-CM | POA: Diagnosis not present

## 2012-11-15 DIAGNOSIS — D509 Iron deficiency anemia, unspecified: Secondary | ICD-10-CM | POA: Diagnosis not present

## 2012-11-15 DIAGNOSIS — D539 Nutritional anemia, unspecified: Secondary | ICD-10-CM | POA: Diagnosis not present

## 2012-11-16 DIAGNOSIS — N2581 Secondary hyperparathyroidism of renal origin: Secondary | ICD-10-CM | POA: Diagnosis not present

## 2012-11-16 DIAGNOSIS — Z79899 Other long term (current) drug therapy: Secondary | ICD-10-CM | POA: Diagnosis not present

## 2012-11-16 DIAGNOSIS — D509 Iron deficiency anemia, unspecified: Secondary | ICD-10-CM | POA: Diagnosis not present

## 2012-11-16 DIAGNOSIS — N186 End stage renal disease: Secondary | ICD-10-CM | POA: Diagnosis not present

## 2012-11-16 DIAGNOSIS — D539 Nutritional anemia, unspecified: Secondary | ICD-10-CM | POA: Diagnosis not present

## 2012-11-17 DIAGNOSIS — D539 Nutritional anemia, unspecified: Secondary | ICD-10-CM | POA: Diagnosis not present

## 2012-11-17 DIAGNOSIS — Z79899 Other long term (current) drug therapy: Secondary | ICD-10-CM | POA: Diagnosis not present

## 2012-11-17 DIAGNOSIS — N2581 Secondary hyperparathyroidism of renal origin: Secondary | ICD-10-CM | POA: Diagnosis not present

## 2012-11-17 DIAGNOSIS — D509 Iron deficiency anemia, unspecified: Secondary | ICD-10-CM | POA: Diagnosis not present

## 2012-11-17 DIAGNOSIS — N186 End stage renal disease: Secondary | ICD-10-CM | POA: Diagnosis not present

## 2012-11-18 DIAGNOSIS — D539 Nutritional anemia, unspecified: Secondary | ICD-10-CM | POA: Diagnosis not present

## 2012-11-18 DIAGNOSIS — D509 Iron deficiency anemia, unspecified: Secondary | ICD-10-CM | POA: Diagnosis not present

## 2012-11-18 DIAGNOSIS — N2581 Secondary hyperparathyroidism of renal origin: Secondary | ICD-10-CM | POA: Diagnosis not present

## 2012-11-18 DIAGNOSIS — N186 End stage renal disease: Secondary | ICD-10-CM | POA: Diagnosis not present

## 2012-11-18 DIAGNOSIS — Z79899 Other long term (current) drug therapy: Secondary | ICD-10-CM | POA: Diagnosis not present

## 2012-11-19 DIAGNOSIS — D539 Nutritional anemia, unspecified: Secondary | ICD-10-CM | POA: Diagnosis not present

## 2012-11-19 DIAGNOSIS — N186 End stage renal disease: Secondary | ICD-10-CM | POA: Diagnosis not present

## 2012-11-19 DIAGNOSIS — N2581 Secondary hyperparathyroidism of renal origin: Secondary | ICD-10-CM | POA: Diagnosis not present

## 2012-11-19 DIAGNOSIS — D509 Iron deficiency anemia, unspecified: Secondary | ICD-10-CM | POA: Diagnosis not present

## 2012-11-19 DIAGNOSIS — Z79899 Other long term (current) drug therapy: Secondary | ICD-10-CM | POA: Diagnosis not present

## 2012-11-20 DIAGNOSIS — Z79899 Other long term (current) drug therapy: Secondary | ICD-10-CM | POA: Diagnosis not present

## 2012-11-20 DIAGNOSIS — N186 End stage renal disease: Secondary | ICD-10-CM | POA: Diagnosis not present

## 2012-11-20 DIAGNOSIS — N2581 Secondary hyperparathyroidism of renal origin: Secondary | ICD-10-CM | POA: Diagnosis not present

## 2012-11-20 DIAGNOSIS — D509 Iron deficiency anemia, unspecified: Secondary | ICD-10-CM | POA: Diagnosis not present

## 2012-11-20 DIAGNOSIS — D539 Nutritional anemia, unspecified: Secondary | ICD-10-CM | POA: Diagnosis not present

## 2012-11-21 DIAGNOSIS — D539 Nutritional anemia, unspecified: Secondary | ICD-10-CM | POA: Diagnosis not present

## 2012-11-21 DIAGNOSIS — D509 Iron deficiency anemia, unspecified: Secondary | ICD-10-CM | POA: Diagnosis not present

## 2012-11-21 DIAGNOSIS — N186 End stage renal disease: Secondary | ICD-10-CM | POA: Diagnosis not present

## 2012-11-21 DIAGNOSIS — Z79899 Other long term (current) drug therapy: Secondary | ICD-10-CM | POA: Diagnosis not present

## 2012-11-21 DIAGNOSIS — N2581 Secondary hyperparathyroidism of renal origin: Secondary | ICD-10-CM | POA: Diagnosis not present

## 2012-11-22 DIAGNOSIS — N186 End stage renal disease: Secondary | ICD-10-CM | POA: Diagnosis not present

## 2012-11-22 DIAGNOSIS — N2581 Secondary hyperparathyroidism of renal origin: Secondary | ICD-10-CM | POA: Diagnosis not present

## 2012-11-22 DIAGNOSIS — D539 Nutritional anemia, unspecified: Secondary | ICD-10-CM | POA: Diagnosis not present

## 2012-11-22 DIAGNOSIS — D509 Iron deficiency anemia, unspecified: Secondary | ICD-10-CM | POA: Diagnosis not present

## 2012-11-22 DIAGNOSIS — Z79899 Other long term (current) drug therapy: Secondary | ICD-10-CM | POA: Diagnosis not present

## 2012-11-23 DIAGNOSIS — N2581 Secondary hyperparathyroidism of renal origin: Secondary | ICD-10-CM | POA: Diagnosis not present

## 2012-11-23 DIAGNOSIS — D509 Iron deficiency anemia, unspecified: Secondary | ICD-10-CM | POA: Diagnosis not present

## 2012-11-23 DIAGNOSIS — N186 End stage renal disease: Secondary | ICD-10-CM | POA: Diagnosis not present

## 2012-11-23 DIAGNOSIS — Z79899 Other long term (current) drug therapy: Secondary | ICD-10-CM | POA: Diagnosis not present

## 2012-11-23 DIAGNOSIS — D539 Nutritional anemia, unspecified: Secondary | ICD-10-CM | POA: Diagnosis not present

## 2012-11-24 DIAGNOSIS — D509 Iron deficiency anemia, unspecified: Secondary | ICD-10-CM | POA: Diagnosis not present

## 2012-11-24 DIAGNOSIS — N2581 Secondary hyperparathyroidism of renal origin: Secondary | ICD-10-CM | POA: Diagnosis not present

## 2012-11-24 DIAGNOSIS — N186 End stage renal disease: Secondary | ICD-10-CM | POA: Diagnosis not present

## 2012-11-24 DIAGNOSIS — D539 Nutritional anemia, unspecified: Secondary | ICD-10-CM | POA: Diagnosis not present

## 2012-11-24 DIAGNOSIS — Z79899 Other long term (current) drug therapy: Secondary | ICD-10-CM | POA: Diagnosis not present

## 2012-11-25 DIAGNOSIS — N2581 Secondary hyperparathyroidism of renal origin: Secondary | ICD-10-CM | POA: Diagnosis not present

## 2012-11-25 DIAGNOSIS — D539 Nutritional anemia, unspecified: Secondary | ICD-10-CM | POA: Diagnosis not present

## 2012-11-25 DIAGNOSIS — Z79899 Other long term (current) drug therapy: Secondary | ICD-10-CM | POA: Diagnosis not present

## 2012-11-25 DIAGNOSIS — D509 Iron deficiency anemia, unspecified: Secondary | ICD-10-CM | POA: Diagnosis not present

## 2012-11-25 DIAGNOSIS — N186 End stage renal disease: Secondary | ICD-10-CM | POA: Diagnosis not present

## 2012-11-26 DIAGNOSIS — D539 Nutritional anemia, unspecified: Secondary | ICD-10-CM | POA: Diagnosis not present

## 2012-11-26 DIAGNOSIS — D509 Iron deficiency anemia, unspecified: Secondary | ICD-10-CM | POA: Diagnosis not present

## 2012-11-26 DIAGNOSIS — N186 End stage renal disease: Secondary | ICD-10-CM | POA: Diagnosis not present

## 2012-11-26 DIAGNOSIS — Z79899 Other long term (current) drug therapy: Secondary | ICD-10-CM | POA: Diagnosis not present

## 2012-11-26 DIAGNOSIS — N2581 Secondary hyperparathyroidism of renal origin: Secondary | ICD-10-CM | POA: Diagnosis not present

## 2012-11-27 DIAGNOSIS — N2581 Secondary hyperparathyroidism of renal origin: Secondary | ICD-10-CM | POA: Diagnosis not present

## 2012-11-27 DIAGNOSIS — N186 End stage renal disease: Secondary | ICD-10-CM | POA: Diagnosis not present

## 2012-11-27 DIAGNOSIS — D509 Iron deficiency anemia, unspecified: Secondary | ICD-10-CM | POA: Diagnosis not present

## 2012-11-27 DIAGNOSIS — D539 Nutritional anemia, unspecified: Secondary | ICD-10-CM | POA: Diagnosis not present

## 2012-11-27 DIAGNOSIS — Z79899 Other long term (current) drug therapy: Secondary | ICD-10-CM | POA: Diagnosis not present

## 2012-11-28 DIAGNOSIS — D509 Iron deficiency anemia, unspecified: Secondary | ICD-10-CM | POA: Diagnosis not present

## 2012-11-28 DIAGNOSIS — D539 Nutritional anemia, unspecified: Secondary | ICD-10-CM | POA: Diagnosis not present

## 2012-11-28 DIAGNOSIS — N2581 Secondary hyperparathyroidism of renal origin: Secondary | ICD-10-CM | POA: Diagnosis not present

## 2012-11-28 DIAGNOSIS — N186 End stage renal disease: Secondary | ICD-10-CM | POA: Diagnosis not present

## 2012-11-28 DIAGNOSIS — Z23 Encounter for immunization: Secondary | ICD-10-CM | POA: Diagnosis not present

## 2012-11-29 DIAGNOSIS — N186 End stage renal disease: Secondary | ICD-10-CM | POA: Diagnosis not present

## 2012-12-28 DIAGNOSIS — N186 End stage renal disease: Secondary | ICD-10-CM | POA: Diagnosis not present

## 2012-12-29 DIAGNOSIS — D509 Iron deficiency anemia, unspecified: Secondary | ICD-10-CM | POA: Diagnosis not present

## 2012-12-29 DIAGNOSIS — N186 End stage renal disease: Secondary | ICD-10-CM | POA: Diagnosis not present

## 2012-12-29 DIAGNOSIS — N2581 Secondary hyperparathyroidism of renal origin: Secondary | ICD-10-CM | POA: Diagnosis not present

## 2013-01-27 DIAGNOSIS — N186 End stage renal disease: Secondary | ICD-10-CM | POA: Diagnosis not present

## 2013-01-28 DIAGNOSIS — N186 End stage renal disease: Secondary | ICD-10-CM | POA: Diagnosis not present

## 2013-01-28 DIAGNOSIS — D509 Iron deficiency anemia, unspecified: Secondary | ICD-10-CM | POA: Diagnosis not present

## 2013-01-29 DIAGNOSIS — D509 Iron deficiency anemia, unspecified: Secondary | ICD-10-CM | POA: Diagnosis not present

## 2013-01-29 DIAGNOSIS — N186 End stage renal disease: Secondary | ICD-10-CM | POA: Diagnosis not present

## 2013-01-30 DIAGNOSIS — N186 End stage renal disease: Secondary | ICD-10-CM | POA: Diagnosis not present

## 2013-01-30 DIAGNOSIS — D509 Iron deficiency anemia, unspecified: Secondary | ICD-10-CM | POA: Diagnosis not present

## 2013-01-31 DIAGNOSIS — N186 End stage renal disease: Secondary | ICD-10-CM | POA: Diagnosis not present

## 2013-01-31 DIAGNOSIS — D509 Iron deficiency anemia, unspecified: Secondary | ICD-10-CM | POA: Diagnosis not present

## 2013-02-01 DIAGNOSIS — D509 Iron deficiency anemia, unspecified: Secondary | ICD-10-CM | POA: Diagnosis not present

## 2013-02-01 DIAGNOSIS — N186 End stage renal disease: Secondary | ICD-10-CM | POA: Diagnosis not present

## 2013-02-02 DIAGNOSIS — N186 End stage renal disease: Secondary | ICD-10-CM | POA: Diagnosis not present

## 2013-02-02 DIAGNOSIS — D509 Iron deficiency anemia, unspecified: Secondary | ICD-10-CM | POA: Diagnosis not present

## 2013-02-03 DIAGNOSIS — N186 End stage renal disease: Secondary | ICD-10-CM | POA: Diagnosis not present

## 2013-02-03 DIAGNOSIS — D509 Iron deficiency anemia, unspecified: Secondary | ICD-10-CM | POA: Diagnosis not present

## 2013-02-04 DIAGNOSIS — D509 Iron deficiency anemia, unspecified: Secondary | ICD-10-CM | POA: Diagnosis not present

## 2013-02-04 DIAGNOSIS — N186 End stage renal disease: Secondary | ICD-10-CM | POA: Diagnosis not present

## 2013-02-05 DIAGNOSIS — D509 Iron deficiency anemia, unspecified: Secondary | ICD-10-CM | POA: Diagnosis not present

## 2013-02-05 DIAGNOSIS — N186 End stage renal disease: Secondary | ICD-10-CM | POA: Diagnosis not present

## 2013-02-06 DIAGNOSIS — N186 End stage renal disease: Secondary | ICD-10-CM | POA: Diagnosis not present

## 2013-02-06 DIAGNOSIS — D509 Iron deficiency anemia, unspecified: Secondary | ICD-10-CM | POA: Diagnosis not present

## 2013-02-07 DIAGNOSIS — N186 End stage renal disease: Secondary | ICD-10-CM | POA: Diagnosis not present

## 2013-02-07 DIAGNOSIS — D509 Iron deficiency anemia, unspecified: Secondary | ICD-10-CM | POA: Diagnosis not present

## 2013-02-08 DIAGNOSIS — D509 Iron deficiency anemia, unspecified: Secondary | ICD-10-CM | POA: Diagnosis not present

## 2013-02-08 DIAGNOSIS — N186 End stage renal disease: Secondary | ICD-10-CM | POA: Diagnosis not present

## 2013-02-09 DIAGNOSIS — D509 Iron deficiency anemia, unspecified: Secondary | ICD-10-CM | POA: Diagnosis not present

## 2013-02-09 DIAGNOSIS — N186 End stage renal disease: Secondary | ICD-10-CM | POA: Diagnosis not present

## 2013-02-10 DIAGNOSIS — D509 Iron deficiency anemia, unspecified: Secondary | ICD-10-CM | POA: Diagnosis not present

## 2013-02-10 DIAGNOSIS — N186 End stage renal disease: Secondary | ICD-10-CM | POA: Diagnosis not present

## 2013-02-11 DIAGNOSIS — N186 End stage renal disease: Secondary | ICD-10-CM | POA: Diagnosis not present

## 2013-02-11 DIAGNOSIS — D509 Iron deficiency anemia, unspecified: Secondary | ICD-10-CM | POA: Diagnosis not present

## 2013-02-12 DIAGNOSIS — D509 Iron deficiency anemia, unspecified: Secondary | ICD-10-CM | POA: Diagnosis not present

## 2013-02-12 DIAGNOSIS — N186 End stage renal disease: Secondary | ICD-10-CM | POA: Diagnosis not present

## 2013-02-13 DIAGNOSIS — D509 Iron deficiency anemia, unspecified: Secondary | ICD-10-CM | POA: Diagnosis not present

## 2013-02-13 DIAGNOSIS — N186 End stage renal disease: Secondary | ICD-10-CM | POA: Diagnosis not present

## 2013-02-14 DIAGNOSIS — N186 End stage renal disease: Secondary | ICD-10-CM | POA: Diagnosis not present

## 2013-02-14 DIAGNOSIS — D509 Iron deficiency anemia, unspecified: Secondary | ICD-10-CM | POA: Diagnosis not present

## 2013-02-15 DIAGNOSIS — N186 End stage renal disease: Secondary | ICD-10-CM | POA: Diagnosis not present

## 2013-02-15 DIAGNOSIS — D509 Iron deficiency anemia, unspecified: Secondary | ICD-10-CM | POA: Diagnosis not present

## 2013-02-16 DIAGNOSIS — N186 End stage renal disease: Secondary | ICD-10-CM | POA: Diagnosis not present

## 2013-02-16 DIAGNOSIS — D509 Iron deficiency anemia, unspecified: Secondary | ICD-10-CM | POA: Diagnosis not present

## 2013-02-17 DIAGNOSIS — N186 End stage renal disease: Secondary | ICD-10-CM | POA: Diagnosis not present

## 2013-02-17 DIAGNOSIS — D509 Iron deficiency anemia, unspecified: Secondary | ICD-10-CM | POA: Diagnosis not present

## 2013-02-18 DIAGNOSIS — N186 End stage renal disease: Secondary | ICD-10-CM | POA: Diagnosis not present

## 2013-02-18 DIAGNOSIS — D509 Iron deficiency anemia, unspecified: Secondary | ICD-10-CM | POA: Diagnosis not present

## 2013-02-19 DIAGNOSIS — D509 Iron deficiency anemia, unspecified: Secondary | ICD-10-CM | POA: Diagnosis not present

## 2013-02-19 DIAGNOSIS — N186 End stage renal disease: Secondary | ICD-10-CM | POA: Diagnosis not present

## 2013-02-20 DIAGNOSIS — D509 Iron deficiency anemia, unspecified: Secondary | ICD-10-CM | POA: Diagnosis not present

## 2013-02-20 DIAGNOSIS — N186 End stage renal disease: Secondary | ICD-10-CM | POA: Diagnosis not present

## 2013-02-21 DIAGNOSIS — N186 End stage renal disease: Secondary | ICD-10-CM | POA: Diagnosis not present

## 2013-02-21 DIAGNOSIS — D509 Iron deficiency anemia, unspecified: Secondary | ICD-10-CM | POA: Diagnosis not present

## 2013-02-22 DIAGNOSIS — N186 End stage renal disease: Secondary | ICD-10-CM | POA: Diagnosis not present

## 2013-02-22 DIAGNOSIS — D509 Iron deficiency anemia, unspecified: Secondary | ICD-10-CM | POA: Diagnosis not present

## 2013-02-23 DIAGNOSIS — D509 Iron deficiency anemia, unspecified: Secondary | ICD-10-CM | POA: Diagnosis not present

## 2013-02-23 DIAGNOSIS — N186 End stage renal disease: Secondary | ICD-10-CM | POA: Diagnosis not present

## 2013-02-24 DIAGNOSIS — N186 End stage renal disease: Secondary | ICD-10-CM | POA: Diagnosis not present

## 2013-02-24 DIAGNOSIS — D509 Iron deficiency anemia, unspecified: Secondary | ICD-10-CM | POA: Diagnosis not present

## 2013-02-25 DIAGNOSIS — N186 End stage renal disease: Secondary | ICD-10-CM | POA: Diagnosis not present

## 2013-02-25 DIAGNOSIS — D509 Iron deficiency anemia, unspecified: Secondary | ICD-10-CM | POA: Diagnosis not present

## 2013-02-26 DIAGNOSIS — D509 Iron deficiency anemia, unspecified: Secondary | ICD-10-CM | POA: Diagnosis not present

## 2013-02-26 DIAGNOSIS — N186 End stage renal disease: Secondary | ICD-10-CM | POA: Diagnosis not present

## 2013-02-27 DIAGNOSIS — D509 Iron deficiency anemia, unspecified: Secondary | ICD-10-CM | POA: Diagnosis not present

## 2013-02-27 DIAGNOSIS — N186 End stage renal disease: Secondary | ICD-10-CM | POA: Diagnosis not present

## 2013-02-28 DIAGNOSIS — D509 Iron deficiency anemia, unspecified: Secondary | ICD-10-CM | POA: Diagnosis not present

## 2013-02-28 DIAGNOSIS — N186 End stage renal disease: Secondary | ICD-10-CM | POA: Diagnosis not present

## 2013-03-01 DIAGNOSIS — N186 End stage renal disease: Secondary | ICD-10-CM | POA: Diagnosis not present

## 2013-03-01 DIAGNOSIS — D509 Iron deficiency anemia, unspecified: Secondary | ICD-10-CM | POA: Diagnosis not present

## 2013-03-02 DIAGNOSIS — N186 End stage renal disease: Secondary | ICD-10-CM | POA: Diagnosis not present

## 2013-03-02 DIAGNOSIS — D509 Iron deficiency anemia, unspecified: Secondary | ICD-10-CM | POA: Diagnosis not present

## 2013-03-03 DIAGNOSIS — D509 Iron deficiency anemia, unspecified: Secondary | ICD-10-CM | POA: Diagnosis not present

## 2013-03-03 DIAGNOSIS — N186 End stage renal disease: Secondary | ICD-10-CM | POA: Diagnosis not present

## 2013-03-04 DIAGNOSIS — D509 Iron deficiency anemia, unspecified: Secondary | ICD-10-CM | POA: Diagnosis not present

## 2013-03-04 DIAGNOSIS — N186 End stage renal disease: Secondary | ICD-10-CM | POA: Diagnosis not present

## 2013-03-05 DIAGNOSIS — N186 End stage renal disease: Secondary | ICD-10-CM | POA: Diagnosis not present

## 2013-03-05 DIAGNOSIS — D509 Iron deficiency anemia, unspecified: Secondary | ICD-10-CM | POA: Diagnosis not present

## 2013-03-06 DIAGNOSIS — N186 End stage renal disease: Secondary | ICD-10-CM | POA: Diagnosis not present

## 2013-03-06 DIAGNOSIS — D509 Iron deficiency anemia, unspecified: Secondary | ICD-10-CM | POA: Diagnosis not present

## 2013-03-07 DIAGNOSIS — D509 Iron deficiency anemia, unspecified: Secondary | ICD-10-CM | POA: Diagnosis not present

## 2013-03-07 DIAGNOSIS — N186 End stage renal disease: Secondary | ICD-10-CM | POA: Diagnosis not present

## 2013-03-08 DIAGNOSIS — D509 Iron deficiency anemia, unspecified: Secondary | ICD-10-CM | POA: Diagnosis not present

## 2013-03-08 DIAGNOSIS — N186 End stage renal disease: Secondary | ICD-10-CM | POA: Diagnosis not present

## 2013-03-09 DIAGNOSIS — N186 End stage renal disease: Secondary | ICD-10-CM | POA: Diagnosis not present

## 2013-03-09 DIAGNOSIS — D509 Iron deficiency anemia, unspecified: Secondary | ICD-10-CM | POA: Diagnosis not present

## 2013-03-10 DIAGNOSIS — N186 End stage renal disease: Secondary | ICD-10-CM | POA: Diagnosis not present

## 2013-03-10 DIAGNOSIS — D509 Iron deficiency anemia, unspecified: Secondary | ICD-10-CM | POA: Diagnosis not present

## 2013-03-11 DIAGNOSIS — D509 Iron deficiency anemia, unspecified: Secondary | ICD-10-CM | POA: Diagnosis not present

## 2013-03-11 DIAGNOSIS — N186 End stage renal disease: Secondary | ICD-10-CM | POA: Diagnosis not present

## 2013-03-12 DIAGNOSIS — D509 Iron deficiency anemia, unspecified: Secondary | ICD-10-CM | POA: Diagnosis not present

## 2013-03-12 DIAGNOSIS — N186 End stage renal disease: Secondary | ICD-10-CM | POA: Diagnosis not present

## 2013-03-13 DIAGNOSIS — N186 End stage renal disease: Secondary | ICD-10-CM | POA: Diagnosis not present

## 2013-03-13 DIAGNOSIS — D509 Iron deficiency anemia, unspecified: Secondary | ICD-10-CM | POA: Diagnosis not present

## 2013-03-14 DIAGNOSIS — N186 End stage renal disease: Secondary | ICD-10-CM | POA: Diagnosis not present

## 2013-03-14 DIAGNOSIS — D509 Iron deficiency anemia, unspecified: Secondary | ICD-10-CM | POA: Diagnosis not present

## 2013-03-15 DIAGNOSIS — D509 Iron deficiency anemia, unspecified: Secondary | ICD-10-CM | POA: Diagnosis not present

## 2013-03-15 DIAGNOSIS — N186 End stage renal disease: Secondary | ICD-10-CM | POA: Diagnosis not present

## 2013-03-16 DIAGNOSIS — D509 Iron deficiency anemia, unspecified: Secondary | ICD-10-CM | POA: Diagnosis not present

## 2013-03-16 DIAGNOSIS — N186 End stage renal disease: Secondary | ICD-10-CM | POA: Diagnosis not present

## 2013-03-17 DIAGNOSIS — D509 Iron deficiency anemia, unspecified: Secondary | ICD-10-CM | POA: Diagnosis not present

## 2013-03-17 DIAGNOSIS — N186 End stage renal disease: Secondary | ICD-10-CM | POA: Diagnosis not present

## 2013-03-18 DIAGNOSIS — N186 End stage renal disease: Secondary | ICD-10-CM | POA: Diagnosis not present

## 2013-03-18 DIAGNOSIS — D509 Iron deficiency anemia, unspecified: Secondary | ICD-10-CM | POA: Diagnosis not present

## 2013-03-19 DIAGNOSIS — N186 End stage renal disease: Secondary | ICD-10-CM | POA: Diagnosis not present

## 2013-03-19 DIAGNOSIS — D509 Iron deficiency anemia, unspecified: Secondary | ICD-10-CM | POA: Diagnosis not present

## 2013-03-20 DIAGNOSIS — N186 End stage renal disease: Secondary | ICD-10-CM | POA: Diagnosis not present

## 2013-03-20 DIAGNOSIS — D509 Iron deficiency anemia, unspecified: Secondary | ICD-10-CM | POA: Diagnosis not present

## 2013-03-21 DIAGNOSIS — D509 Iron deficiency anemia, unspecified: Secondary | ICD-10-CM | POA: Diagnosis not present

## 2013-03-21 DIAGNOSIS — N186 End stage renal disease: Secondary | ICD-10-CM | POA: Diagnosis not present

## 2013-03-22 DIAGNOSIS — N186 End stage renal disease: Secondary | ICD-10-CM | POA: Diagnosis not present

## 2013-03-22 DIAGNOSIS — D509 Iron deficiency anemia, unspecified: Secondary | ICD-10-CM | POA: Diagnosis not present

## 2013-03-23 DIAGNOSIS — N186 End stage renal disease: Secondary | ICD-10-CM | POA: Diagnosis not present

## 2013-03-23 DIAGNOSIS — D509 Iron deficiency anemia, unspecified: Secondary | ICD-10-CM | POA: Diagnosis not present

## 2013-03-24 DIAGNOSIS — D509 Iron deficiency anemia, unspecified: Secondary | ICD-10-CM | POA: Diagnosis not present

## 2013-03-24 DIAGNOSIS — N186 End stage renal disease: Secondary | ICD-10-CM | POA: Diagnosis not present

## 2013-03-25 DIAGNOSIS — N186 End stage renal disease: Secondary | ICD-10-CM | POA: Diagnosis not present

## 2013-03-25 DIAGNOSIS — D509 Iron deficiency anemia, unspecified: Secondary | ICD-10-CM | POA: Diagnosis not present

## 2013-03-26 DIAGNOSIS — D509 Iron deficiency anemia, unspecified: Secondary | ICD-10-CM | POA: Diagnosis not present

## 2013-03-26 DIAGNOSIS — N186 End stage renal disease: Secondary | ICD-10-CM | POA: Diagnosis not present

## 2013-03-27 DIAGNOSIS — D509 Iron deficiency anemia, unspecified: Secondary | ICD-10-CM | POA: Diagnosis not present

## 2013-03-27 DIAGNOSIS — N186 End stage renal disease: Secondary | ICD-10-CM | POA: Diagnosis not present

## 2013-03-28 DIAGNOSIS — D509 Iron deficiency anemia, unspecified: Secondary | ICD-10-CM | POA: Diagnosis not present

## 2013-03-28 DIAGNOSIS — N186 End stage renal disease: Secondary | ICD-10-CM | POA: Diagnosis not present

## 2013-03-29 DIAGNOSIS — N186 End stage renal disease: Secondary | ICD-10-CM | POA: Diagnosis not present

## 2013-03-29 DIAGNOSIS — D509 Iron deficiency anemia, unspecified: Secondary | ICD-10-CM | POA: Diagnosis not present

## 2013-03-30 DIAGNOSIS — N186 End stage renal disease: Secondary | ICD-10-CM | POA: Diagnosis not present

## 2013-03-30 DIAGNOSIS — D509 Iron deficiency anemia, unspecified: Secondary | ICD-10-CM | POA: Diagnosis not present

## 2013-03-31 DIAGNOSIS — N186 End stage renal disease: Secondary | ICD-10-CM | POA: Diagnosis not present

## 2013-03-31 DIAGNOSIS — D509 Iron deficiency anemia, unspecified: Secondary | ICD-10-CM | POA: Diagnosis not present

## 2013-04-01 DIAGNOSIS — D509 Iron deficiency anemia, unspecified: Secondary | ICD-10-CM | POA: Diagnosis not present

## 2013-04-01 DIAGNOSIS — N186 End stage renal disease: Secondary | ICD-10-CM | POA: Diagnosis not present

## 2013-04-02 DIAGNOSIS — N186 End stage renal disease: Secondary | ICD-10-CM | POA: Diagnosis not present

## 2013-04-02 DIAGNOSIS — D509 Iron deficiency anemia, unspecified: Secondary | ICD-10-CM | POA: Diagnosis not present

## 2013-04-03 DIAGNOSIS — N186 End stage renal disease: Secondary | ICD-10-CM | POA: Diagnosis not present

## 2013-04-03 DIAGNOSIS — D509 Iron deficiency anemia, unspecified: Secondary | ICD-10-CM | POA: Diagnosis not present

## 2013-04-04 DIAGNOSIS — N186 End stage renal disease: Secondary | ICD-10-CM | POA: Diagnosis not present

## 2013-04-04 DIAGNOSIS — D509 Iron deficiency anemia, unspecified: Secondary | ICD-10-CM | POA: Diagnosis not present

## 2013-04-05 DIAGNOSIS — D509 Iron deficiency anemia, unspecified: Secondary | ICD-10-CM | POA: Diagnosis not present

## 2013-04-05 DIAGNOSIS — N186 End stage renal disease: Secondary | ICD-10-CM | POA: Diagnosis not present

## 2013-04-06 DIAGNOSIS — D509 Iron deficiency anemia, unspecified: Secondary | ICD-10-CM | POA: Diagnosis not present

## 2013-04-06 DIAGNOSIS — N186 End stage renal disease: Secondary | ICD-10-CM | POA: Diagnosis not present

## 2013-04-07 DIAGNOSIS — D509 Iron deficiency anemia, unspecified: Secondary | ICD-10-CM | POA: Diagnosis not present

## 2013-04-07 DIAGNOSIS — N186 End stage renal disease: Secondary | ICD-10-CM | POA: Diagnosis not present

## 2013-04-08 DIAGNOSIS — N186 End stage renal disease: Secondary | ICD-10-CM | POA: Diagnosis not present

## 2013-04-08 DIAGNOSIS — D509 Iron deficiency anemia, unspecified: Secondary | ICD-10-CM | POA: Diagnosis not present

## 2013-04-09 DIAGNOSIS — D509 Iron deficiency anemia, unspecified: Secondary | ICD-10-CM | POA: Diagnosis not present

## 2013-04-09 DIAGNOSIS — N186 End stage renal disease: Secondary | ICD-10-CM | POA: Diagnosis not present

## 2013-04-10 DIAGNOSIS — D509 Iron deficiency anemia, unspecified: Secondary | ICD-10-CM | POA: Diagnosis not present

## 2013-04-10 DIAGNOSIS — N186 End stage renal disease: Secondary | ICD-10-CM | POA: Diagnosis not present

## 2013-04-11 DIAGNOSIS — D509 Iron deficiency anemia, unspecified: Secondary | ICD-10-CM | POA: Diagnosis not present

## 2013-04-11 DIAGNOSIS — N186 End stage renal disease: Secondary | ICD-10-CM | POA: Diagnosis not present

## 2013-04-12 DIAGNOSIS — D509 Iron deficiency anemia, unspecified: Secondary | ICD-10-CM | POA: Diagnosis not present

## 2013-04-12 DIAGNOSIS — N186 End stage renal disease: Secondary | ICD-10-CM | POA: Diagnosis not present

## 2013-04-13 DIAGNOSIS — N186 End stage renal disease: Secondary | ICD-10-CM | POA: Diagnosis not present

## 2013-04-13 DIAGNOSIS — D509 Iron deficiency anemia, unspecified: Secondary | ICD-10-CM | POA: Diagnosis not present

## 2013-04-14 DIAGNOSIS — N186 End stage renal disease: Secondary | ICD-10-CM | POA: Diagnosis not present

## 2013-04-14 DIAGNOSIS — D509 Iron deficiency anemia, unspecified: Secondary | ICD-10-CM | POA: Diagnosis not present

## 2013-04-15 DIAGNOSIS — D509 Iron deficiency anemia, unspecified: Secondary | ICD-10-CM | POA: Diagnosis not present

## 2013-04-15 DIAGNOSIS — N186 End stage renal disease: Secondary | ICD-10-CM | POA: Diagnosis not present

## 2013-04-16 DIAGNOSIS — N186 End stage renal disease: Secondary | ICD-10-CM | POA: Diagnosis not present

## 2013-04-16 DIAGNOSIS — D509 Iron deficiency anemia, unspecified: Secondary | ICD-10-CM | POA: Diagnosis not present

## 2013-04-17 DIAGNOSIS — N186 End stage renal disease: Secondary | ICD-10-CM | POA: Diagnosis not present

## 2013-04-17 DIAGNOSIS — D509 Iron deficiency anemia, unspecified: Secondary | ICD-10-CM | POA: Diagnosis not present

## 2013-04-18 DIAGNOSIS — N186 End stage renal disease: Secondary | ICD-10-CM | POA: Diagnosis not present

## 2013-04-18 DIAGNOSIS — D509 Iron deficiency anemia, unspecified: Secondary | ICD-10-CM | POA: Diagnosis not present

## 2013-04-19 DIAGNOSIS — N186 End stage renal disease: Secondary | ICD-10-CM | POA: Diagnosis not present

## 2013-04-19 DIAGNOSIS — D509 Iron deficiency anemia, unspecified: Secondary | ICD-10-CM | POA: Diagnosis not present

## 2013-04-20 DIAGNOSIS — D509 Iron deficiency anemia, unspecified: Secondary | ICD-10-CM | POA: Diagnosis not present

## 2013-04-20 DIAGNOSIS — N186 End stage renal disease: Secondary | ICD-10-CM | POA: Diagnosis not present

## 2013-04-21 DIAGNOSIS — N186 End stage renal disease: Secondary | ICD-10-CM | POA: Diagnosis not present

## 2013-04-21 DIAGNOSIS — D509 Iron deficiency anemia, unspecified: Secondary | ICD-10-CM | POA: Diagnosis not present

## 2013-04-22 DIAGNOSIS — N186 End stage renal disease: Secondary | ICD-10-CM | POA: Diagnosis not present

## 2013-04-22 DIAGNOSIS — D509 Iron deficiency anemia, unspecified: Secondary | ICD-10-CM | POA: Diagnosis not present

## 2013-04-23 DIAGNOSIS — D509 Iron deficiency anemia, unspecified: Secondary | ICD-10-CM | POA: Diagnosis not present

## 2013-04-23 DIAGNOSIS — N186 End stage renal disease: Secondary | ICD-10-CM | POA: Diagnosis not present

## 2013-04-24 DIAGNOSIS — D509 Iron deficiency anemia, unspecified: Secondary | ICD-10-CM | POA: Diagnosis not present

## 2013-04-24 DIAGNOSIS — N186 End stage renal disease: Secondary | ICD-10-CM | POA: Diagnosis not present

## 2013-04-25 DIAGNOSIS — N186 End stage renal disease: Secondary | ICD-10-CM | POA: Diagnosis not present

## 2013-04-25 DIAGNOSIS — D509 Iron deficiency anemia, unspecified: Secondary | ICD-10-CM | POA: Diagnosis not present

## 2013-04-26 DIAGNOSIS — N186 End stage renal disease: Secondary | ICD-10-CM | POA: Diagnosis not present

## 2013-04-26 DIAGNOSIS — D509 Iron deficiency anemia, unspecified: Secondary | ICD-10-CM | POA: Diagnosis not present

## 2013-04-27 DIAGNOSIS — D509 Iron deficiency anemia, unspecified: Secondary | ICD-10-CM | POA: Diagnosis not present

## 2013-04-27 DIAGNOSIS — N186 End stage renal disease: Secondary | ICD-10-CM | POA: Diagnosis not present

## 2013-04-28 DIAGNOSIS — D509 Iron deficiency anemia, unspecified: Secondary | ICD-10-CM | POA: Diagnosis not present

## 2013-04-28 DIAGNOSIS — N186 End stage renal disease: Secondary | ICD-10-CM | POA: Diagnosis not present

## 2013-04-29 DIAGNOSIS — N186 End stage renal disease: Secondary | ICD-10-CM | POA: Diagnosis not present

## 2013-04-29 DIAGNOSIS — D509 Iron deficiency anemia, unspecified: Secondary | ICD-10-CM | POA: Diagnosis not present

## 2013-04-30 DIAGNOSIS — N186 End stage renal disease: Secondary | ICD-10-CM | POA: Diagnosis not present

## 2013-04-30 DIAGNOSIS — D509 Iron deficiency anemia, unspecified: Secondary | ICD-10-CM | POA: Diagnosis not present

## 2013-05-01 DIAGNOSIS — D509 Iron deficiency anemia, unspecified: Secondary | ICD-10-CM | POA: Diagnosis not present

## 2013-05-01 DIAGNOSIS — N186 End stage renal disease: Secondary | ICD-10-CM | POA: Diagnosis not present

## 2013-05-02 DIAGNOSIS — N186 End stage renal disease: Secondary | ICD-10-CM | POA: Diagnosis not present

## 2013-05-02 DIAGNOSIS — D509 Iron deficiency anemia, unspecified: Secondary | ICD-10-CM | POA: Diagnosis not present

## 2013-05-22 DIAGNOSIS — N186 End stage renal disease: Secondary | ICD-10-CM | POA: Diagnosis not present

## 2013-05-22 DIAGNOSIS — D509 Iron deficiency anemia, unspecified: Secondary | ICD-10-CM | POA: Diagnosis not present

## 2013-05-23 DIAGNOSIS — N186 End stage renal disease: Secondary | ICD-10-CM | POA: Diagnosis not present

## 2013-05-23 DIAGNOSIS — D509 Iron deficiency anemia, unspecified: Secondary | ICD-10-CM | POA: Diagnosis not present

## 2013-05-24 DIAGNOSIS — D509 Iron deficiency anemia, unspecified: Secondary | ICD-10-CM | POA: Diagnosis not present

## 2013-05-24 DIAGNOSIS — N186 End stage renal disease: Secondary | ICD-10-CM | POA: Diagnosis not present

## 2013-05-25 DIAGNOSIS — N186 End stage renal disease: Secondary | ICD-10-CM | POA: Diagnosis not present

## 2013-05-25 DIAGNOSIS — D509 Iron deficiency anemia, unspecified: Secondary | ICD-10-CM | POA: Diagnosis not present

## 2013-05-26 DIAGNOSIS — D509 Iron deficiency anemia, unspecified: Secondary | ICD-10-CM | POA: Diagnosis not present

## 2013-05-26 DIAGNOSIS — N186 End stage renal disease: Secondary | ICD-10-CM | POA: Diagnosis not present

## 2013-05-27 DIAGNOSIS — N186 End stage renal disease: Secondary | ICD-10-CM | POA: Diagnosis not present

## 2013-05-27 DIAGNOSIS — D509 Iron deficiency anemia, unspecified: Secondary | ICD-10-CM | POA: Diagnosis not present

## 2013-05-28 DIAGNOSIS — D509 Iron deficiency anemia, unspecified: Secondary | ICD-10-CM | POA: Diagnosis not present

## 2013-05-28 DIAGNOSIS — N186 End stage renal disease: Secondary | ICD-10-CM | POA: Diagnosis not present

## 2013-05-29 DIAGNOSIS — R7309 Other abnormal glucose: Secondary | ICD-10-CM | POA: Diagnosis not present

## 2013-05-29 DIAGNOSIS — N186 End stage renal disease: Secondary | ICD-10-CM | POA: Diagnosis not present

## 2013-05-29 DIAGNOSIS — D509 Iron deficiency anemia, unspecified: Secondary | ICD-10-CM | POA: Diagnosis not present

## 2013-05-29 DIAGNOSIS — E785 Hyperlipidemia, unspecified: Secondary | ICD-10-CM | POA: Diagnosis not present

## 2013-06-27 DIAGNOSIS — N186 End stage renal disease: Secondary | ICD-10-CM | POA: Diagnosis not present

## 2013-06-28 DIAGNOSIS — N2581 Secondary hyperparathyroidism of renal origin: Secondary | ICD-10-CM | POA: Diagnosis not present

## 2013-06-28 DIAGNOSIS — D509 Iron deficiency anemia, unspecified: Secondary | ICD-10-CM | POA: Diagnosis not present

## 2013-06-28 DIAGNOSIS — Z79899 Other long term (current) drug therapy: Secondary | ICD-10-CM | POA: Diagnosis not present

## 2013-06-28 DIAGNOSIS — R7309 Other abnormal glucose: Secondary | ICD-10-CM | POA: Diagnosis not present

## 2013-06-28 DIAGNOSIS — N186 End stage renal disease: Secondary | ICD-10-CM | POA: Diagnosis not present

## 2013-06-29 DIAGNOSIS — D509 Iron deficiency anemia, unspecified: Secondary | ICD-10-CM | POA: Diagnosis not present

## 2013-06-29 DIAGNOSIS — Z79899 Other long term (current) drug therapy: Secondary | ICD-10-CM | POA: Diagnosis not present

## 2013-06-29 DIAGNOSIS — N186 End stage renal disease: Secondary | ICD-10-CM | POA: Diagnosis not present

## 2013-06-29 DIAGNOSIS — N2581 Secondary hyperparathyroidism of renal origin: Secondary | ICD-10-CM | POA: Diagnosis not present

## 2013-06-29 DIAGNOSIS — R7309 Other abnormal glucose: Secondary | ICD-10-CM | POA: Diagnosis not present

## 2013-06-30 DIAGNOSIS — D509 Iron deficiency anemia, unspecified: Secondary | ICD-10-CM | POA: Diagnosis not present

## 2013-06-30 DIAGNOSIS — Z79899 Other long term (current) drug therapy: Secondary | ICD-10-CM | POA: Diagnosis not present

## 2013-06-30 DIAGNOSIS — N186 End stage renal disease: Secondary | ICD-10-CM | POA: Diagnosis not present

## 2013-06-30 DIAGNOSIS — N2581 Secondary hyperparathyroidism of renal origin: Secondary | ICD-10-CM | POA: Diagnosis not present

## 2013-06-30 DIAGNOSIS — R7309 Other abnormal glucose: Secondary | ICD-10-CM | POA: Diagnosis not present

## 2013-07-01 DIAGNOSIS — R7309 Other abnormal glucose: Secondary | ICD-10-CM | POA: Diagnosis not present

## 2013-07-01 DIAGNOSIS — N186 End stage renal disease: Secondary | ICD-10-CM | POA: Diagnosis not present

## 2013-07-01 DIAGNOSIS — Z79899 Other long term (current) drug therapy: Secondary | ICD-10-CM | POA: Diagnosis not present

## 2013-07-01 DIAGNOSIS — D509 Iron deficiency anemia, unspecified: Secondary | ICD-10-CM | POA: Diagnosis not present

## 2013-07-01 DIAGNOSIS — N2581 Secondary hyperparathyroidism of renal origin: Secondary | ICD-10-CM | POA: Diagnosis not present

## 2013-07-02 DIAGNOSIS — D509 Iron deficiency anemia, unspecified: Secondary | ICD-10-CM | POA: Diagnosis not present

## 2013-07-02 DIAGNOSIS — N2581 Secondary hyperparathyroidism of renal origin: Secondary | ICD-10-CM | POA: Diagnosis not present

## 2013-07-02 DIAGNOSIS — Z79899 Other long term (current) drug therapy: Secondary | ICD-10-CM | POA: Diagnosis not present

## 2013-07-02 DIAGNOSIS — N186 End stage renal disease: Secondary | ICD-10-CM | POA: Diagnosis not present

## 2013-07-02 DIAGNOSIS — R7309 Other abnormal glucose: Secondary | ICD-10-CM | POA: Diagnosis not present

## 2013-07-03 DIAGNOSIS — D509 Iron deficiency anemia, unspecified: Secondary | ICD-10-CM | POA: Diagnosis not present

## 2013-07-03 DIAGNOSIS — N2581 Secondary hyperparathyroidism of renal origin: Secondary | ICD-10-CM | POA: Diagnosis not present

## 2013-07-03 DIAGNOSIS — N186 End stage renal disease: Secondary | ICD-10-CM | POA: Diagnosis not present

## 2013-07-03 DIAGNOSIS — R7309 Other abnormal glucose: Secondary | ICD-10-CM | POA: Diagnosis not present

## 2013-07-03 DIAGNOSIS — Z79899 Other long term (current) drug therapy: Secondary | ICD-10-CM | POA: Diagnosis not present

## 2013-07-04 DIAGNOSIS — D509 Iron deficiency anemia, unspecified: Secondary | ICD-10-CM | POA: Diagnosis not present

## 2013-07-04 DIAGNOSIS — R7309 Other abnormal glucose: Secondary | ICD-10-CM | POA: Diagnosis not present

## 2013-07-04 DIAGNOSIS — N2581 Secondary hyperparathyroidism of renal origin: Secondary | ICD-10-CM | POA: Diagnosis not present

## 2013-07-04 DIAGNOSIS — Z79899 Other long term (current) drug therapy: Secondary | ICD-10-CM | POA: Diagnosis not present

## 2013-07-04 DIAGNOSIS — N186 End stage renal disease: Secondary | ICD-10-CM | POA: Diagnosis not present

## 2013-07-05 DIAGNOSIS — D509 Iron deficiency anemia, unspecified: Secondary | ICD-10-CM | POA: Diagnosis not present

## 2013-07-05 DIAGNOSIS — N186 End stage renal disease: Secondary | ICD-10-CM | POA: Diagnosis not present

## 2013-07-05 DIAGNOSIS — R7309 Other abnormal glucose: Secondary | ICD-10-CM | POA: Diagnosis not present

## 2013-07-05 DIAGNOSIS — Z79899 Other long term (current) drug therapy: Secondary | ICD-10-CM | POA: Diagnosis not present

## 2013-07-05 DIAGNOSIS — N2581 Secondary hyperparathyroidism of renal origin: Secondary | ICD-10-CM | POA: Diagnosis not present

## 2013-07-06 DIAGNOSIS — N2581 Secondary hyperparathyroidism of renal origin: Secondary | ICD-10-CM | POA: Diagnosis not present

## 2013-07-06 DIAGNOSIS — R7309 Other abnormal glucose: Secondary | ICD-10-CM | POA: Diagnosis not present

## 2013-07-06 DIAGNOSIS — D509 Iron deficiency anemia, unspecified: Secondary | ICD-10-CM | POA: Diagnosis not present

## 2013-07-06 DIAGNOSIS — N186 End stage renal disease: Secondary | ICD-10-CM | POA: Diagnosis not present

## 2013-07-06 DIAGNOSIS — Z79899 Other long term (current) drug therapy: Secondary | ICD-10-CM | POA: Diagnosis not present

## 2013-07-07 DIAGNOSIS — R7309 Other abnormal glucose: Secondary | ICD-10-CM | POA: Diagnosis not present

## 2013-07-07 DIAGNOSIS — Z79899 Other long term (current) drug therapy: Secondary | ICD-10-CM | POA: Diagnosis not present

## 2013-07-07 DIAGNOSIS — N2581 Secondary hyperparathyroidism of renal origin: Secondary | ICD-10-CM | POA: Diagnosis not present

## 2013-07-07 DIAGNOSIS — D509 Iron deficiency anemia, unspecified: Secondary | ICD-10-CM | POA: Diagnosis not present

## 2013-07-07 DIAGNOSIS — N186 End stage renal disease: Secondary | ICD-10-CM | POA: Diagnosis not present

## 2013-07-08 DIAGNOSIS — N2581 Secondary hyperparathyroidism of renal origin: Secondary | ICD-10-CM | POA: Diagnosis not present

## 2013-07-08 DIAGNOSIS — Z79899 Other long term (current) drug therapy: Secondary | ICD-10-CM | POA: Diagnosis not present

## 2013-07-08 DIAGNOSIS — R7309 Other abnormal glucose: Secondary | ICD-10-CM | POA: Diagnosis not present

## 2013-07-08 DIAGNOSIS — N186 End stage renal disease: Secondary | ICD-10-CM | POA: Diagnosis not present

## 2013-07-08 DIAGNOSIS — D509 Iron deficiency anemia, unspecified: Secondary | ICD-10-CM | POA: Diagnosis not present

## 2013-07-09 DIAGNOSIS — N186 End stage renal disease: Secondary | ICD-10-CM | POA: Diagnosis not present

## 2013-07-09 DIAGNOSIS — D509 Iron deficiency anemia, unspecified: Secondary | ICD-10-CM | POA: Diagnosis not present

## 2013-07-09 DIAGNOSIS — N2581 Secondary hyperparathyroidism of renal origin: Secondary | ICD-10-CM | POA: Diagnosis not present

## 2013-07-09 DIAGNOSIS — R7309 Other abnormal glucose: Secondary | ICD-10-CM | POA: Diagnosis not present

## 2013-07-09 DIAGNOSIS — Z79899 Other long term (current) drug therapy: Secondary | ICD-10-CM | POA: Diagnosis not present

## 2013-07-10 DIAGNOSIS — N186 End stage renal disease: Secondary | ICD-10-CM | POA: Diagnosis not present

## 2013-07-10 DIAGNOSIS — Z79899 Other long term (current) drug therapy: Secondary | ICD-10-CM | POA: Diagnosis not present

## 2013-07-10 DIAGNOSIS — R7309 Other abnormal glucose: Secondary | ICD-10-CM | POA: Diagnosis not present

## 2013-07-10 DIAGNOSIS — D509 Iron deficiency anemia, unspecified: Secondary | ICD-10-CM | POA: Diagnosis not present

## 2013-07-10 DIAGNOSIS — N2581 Secondary hyperparathyroidism of renal origin: Secondary | ICD-10-CM | POA: Diagnosis not present

## 2013-07-11 DIAGNOSIS — Z79899 Other long term (current) drug therapy: Secondary | ICD-10-CM | POA: Diagnosis not present

## 2013-07-11 DIAGNOSIS — D509 Iron deficiency anemia, unspecified: Secondary | ICD-10-CM | POA: Diagnosis not present

## 2013-07-11 DIAGNOSIS — N186 End stage renal disease: Secondary | ICD-10-CM | POA: Diagnosis not present

## 2013-07-11 DIAGNOSIS — N2581 Secondary hyperparathyroidism of renal origin: Secondary | ICD-10-CM | POA: Diagnosis not present

## 2013-07-11 DIAGNOSIS — R7309 Other abnormal glucose: Secondary | ICD-10-CM | POA: Diagnosis not present

## 2013-07-12 DIAGNOSIS — N2581 Secondary hyperparathyroidism of renal origin: Secondary | ICD-10-CM | POA: Diagnosis not present

## 2013-07-12 DIAGNOSIS — D509 Iron deficiency anemia, unspecified: Secondary | ICD-10-CM | POA: Diagnosis not present

## 2013-07-12 DIAGNOSIS — Z79899 Other long term (current) drug therapy: Secondary | ICD-10-CM | POA: Diagnosis not present

## 2013-07-12 DIAGNOSIS — N186 End stage renal disease: Secondary | ICD-10-CM | POA: Diagnosis not present

## 2013-07-12 DIAGNOSIS — R7309 Other abnormal glucose: Secondary | ICD-10-CM | POA: Diagnosis not present

## 2013-07-13 DIAGNOSIS — Z79899 Other long term (current) drug therapy: Secondary | ICD-10-CM | POA: Diagnosis not present

## 2013-07-13 DIAGNOSIS — N186 End stage renal disease: Secondary | ICD-10-CM | POA: Diagnosis not present

## 2013-07-13 DIAGNOSIS — N2581 Secondary hyperparathyroidism of renal origin: Secondary | ICD-10-CM | POA: Diagnosis not present

## 2013-07-13 DIAGNOSIS — R7309 Other abnormal glucose: Secondary | ICD-10-CM | POA: Diagnosis not present

## 2013-07-13 DIAGNOSIS — D509 Iron deficiency anemia, unspecified: Secondary | ICD-10-CM | POA: Diagnosis not present

## 2013-07-14 DIAGNOSIS — R7309 Other abnormal glucose: Secondary | ICD-10-CM | POA: Diagnosis not present

## 2013-07-14 DIAGNOSIS — N186 End stage renal disease: Secondary | ICD-10-CM | POA: Diagnosis not present

## 2013-07-14 DIAGNOSIS — Z79899 Other long term (current) drug therapy: Secondary | ICD-10-CM | POA: Diagnosis not present

## 2013-07-14 DIAGNOSIS — N2581 Secondary hyperparathyroidism of renal origin: Secondary | ICD-10-CM | POA: Diagnosis not present

## 2013-07-14 DIAGNOSIS — D509 Iron deficiency anemia, unspecified: Secondary | ICD-10-CM | POA: Diagnosis not present

## 2013-07-15 DIAGNOSIS — R7309 Other abnormal glucose: Secondary | ICD-10-CM | POA: Diagnosis not present

## 2013-07-15 DIAGNOSIS — Z79899 Other long term (current) drug therapy: Secondary | ICD-10-CM | POA: Diagnosis not present

## 2013-07-15 DIAGNOSIS — N186 End stage renal disease: Secondary | ICD-10-CM | POA: Diagnosis not present

## 2013-07-15 DIAGNOSIS — N2581 Secondary hyperparathyroidism of renal origin: Secondary | ICD-10-CM | POA: Diagnosis not present

## 2013-07-15 DIAGNOSIS — D509 Iron deficiency anemia, unspecified: Secondary | ICD-10-CM | POA: Diagnosis not present

## 2013-07-16 DIAGNOSIS — N2581 Secondary hyperparathyroidism of renal origin: Secondary | ICD-10-CM | POA: Diagnosis not present

## 2013-07-16 DIAGNOSIS — Z79899 Other long term (current) drug therapy: Secondary | ICD-10-CM | POA: Diagnosis not present

## 2013-07-16 DIAGNOSIS — N186 End stage renal disease: Secondary | ICD-10-CM | POA: Diagnosis not present

## 2013-07-16 DIAGNOSIS — D509 Iron deficiency anemia, unspecified: Secondary | ICD-10-CM | POA: Diagnosis not present

## 2013-07-16 DIAGNOSIS — R7309 Other abnormal glucose: Secondary | ICD-10-CM | POA: Diagnosis not present

## 2013-07-17 DIAGNOSIS — N186 End stage renal disease: Secondary | ICD-10-CM | POA: Diagnosis not present

## 2013-07-17 DIAGNOSIS — D509 Iron deficiency anemia, unspecified: Secondary | ICD-10-CM | POA: Diagnosis not present

## 2013-07-17 DIAGNOSIS — Z79899 Other long term (current) drug therapy: Secondary | ICD-10-CM | POA: Diagnosis not present

## 2013-07-17 DIAGNOSIS — N2581 Secondary hyperparathyroidism of renal origin: Secondary | ICD-10-CM | POA: Diagnosis not present

## 2013-07-17 DIAGNOSIS — R7309 Other abnormal glucose: Secondary | ICD-10-CM | POA: Diagnosis not present

## 2013-07-18 DIAGNOSIS — N2581 Secondary hyperparathyroidism of renal origin: Secondary | ICD-10-CM | POA: Diagnosis not present

## 2013-07-18 DIAGNOSIS — Z79899 Other long term (current) drug therapy: Secondary | ICD-10-CM | POA: Diagnosis not present

## 2013-07-18 DIAGNOSIS — R7309 Other abnormal glucose: Secondary | ICD-10-CM | POA: Diagnosis not present

## 2013-07-18 DIAGNOSIS — N186 End stage renal disease: Secondary | ICD-10-CM | POA: Diagnosis not present

## 2013-07-18 DIAGNOSIS — D509 Iron deficiency anemia, unspecified: Secondary | ICD-10-CM | POA: Diagnosis not present

## 2013-07-19 DIAGNOSIS — D509 Iron deficiency anemia, unspecified: Secondary | ICD-10-CM | POA: Diagnosis not present

## 2013-07-19 DIAGNOSIS — N2581 Secondary hyperparathyroidism of renal origin: Secondary | ICD-10-CM | POA: Diagnosis not present

## 2013-07-19 DIAGNOSIS — R7309 Other abnormal glucose: Secondary | ICD-10-CM | POA: Diagnosis not present

## 2013-07-19 DIAGNOSIS — Z79899 Other long term (current) drug therapy: Secondary | ICD-10-CM | POA: Diagnosis not present

## 2013-07-19 DIAGNOSIS — N186 End stage renal disease: Secondary | ICD-10-CM | POA: Diagnosis not present

## 2013-07-20 DIAGNOSIS — R7309 Other abnormal glucose: Secondary | ICD-10-CM | POA: Diagnosis not present

## 2013-07-20 DIAGNOSIS — D509 Iron deficiency anemia, unspecified: Secondary | ICD-10-CM | POA: Diagnosis not present

## 2013-07-20 DIAGNOSIS — N2581 Secondary hyperparathyroidism of renal origin: Secondary | ICD-10-CM | POA: Diagnosis not present

## 2013-07-20 DIAGNOSIS — Z79899 Other long term (current) drug therapy: Secondary | ICD-10-CM | POA: Diagnosis not present

## 2013-07-20 DIAGNOSIS — N186 End stage renal disease: Secondary | ICD-10-CM | POA: Diagnosis not present

## 2013-07-21 DIAGNOSIS — R7309 Other abnormal glucose: Secondary | ICD-10-CM | POA: Diagnosis not present

## 2013-07-21 DIAGNOSIS — N2581 Secondary hyperparathyroidism of renal origin: Secondary | ICD-10-CM | POA: Diagnosis not present

## 2013-07-21 DIAGNOSIS — D509 Iron deficiency anemia, unspecified: Secondary | ICD-10-CM | POA: Diagnosis not present

## 2013-07-21 DIAGNOSIS — N186 End stage renal disease: Secondary | ICD-10-CM | POA: Diagnosis not present

## 2013-07-21 DIAGNOSIS — Z79899 Other long term (current) drug therapy: Secondary | ICD-10-CM | POA: Diagnosis not present

## 2013-07-22 DIAGNOSIS — R7309 Other abnormal glucose: Secondary | ICD-10-CM | POA: Diagnosis not present

## 2013-07-22 DIAGNOSIS — N186 End stage renal disease: Secondary | ICD-10-CM | POA: Diagnosis not present

## 2013-07-22 DIAGNOSIS — N2581 Secondary hyperparathyroidism of renal origin: Secondary | ICD-10-CM | POA: Diagnosis not present

## 2013-07-22 DIAGNOSIS — Z79899 Other long term (current) drug therapy: Secondary | ICD-10-CM | POA: Diagnosis not present

## 2013-07-22 DIAGNOSIS — D509 Iron deficiency anemia, unspecified: Secondary | ICD-10-CM | POA: Diagnosis not present

## 2013-07-23 DIAGNOSIS — D509 Iron deficiency anemia, unspecified: Secondary | ICD-10-CM | POA: Diagnosis not present

## 2013-07-23 DIAGNOSIS — R7309 Other abnormal glucose: Secondary | ICD-10-CM | POA: Diagnosis not present

## 2013-07-23 DIAGNOSIS — N186 End stage renal disease: Secondary | ICD-10-CM | POA: Diagnosis not present

## 2013-07-23 DIAGNOSIS — Z79899 Other long term (current) drug therapy: Secondary | ICD-10-CM | POA: Diagnosis not present

## 2013-07-23 DIAGNOSIS — N2581 Secondary hyperparathyroidism of renal origin: Secondary | ICD-10-CM | POA: Diagnosis not present

## 2013-07-24 DIAGNOSIS — N2581 Secondary hyperparathyroidism of renal origin: Secondary | ICD-10-CM | POA: Diagnosis not present

## 2013-07-24 DIAGNOSIS — Z79899 Other long term (current) drug therapy: Secondary | ICD-10-CM | POA: Diagnosis not present

## 2013-07-24 DIAGNOSIS — N186 End stage renal disease: Secondary | ICD-10-CM | POA: Diagnosis not present

## 2013-07-24 DIAGNOSIS — D509 Iron deficiency anemia, unspecified: Secondary | ICD-10-CM | POA: Diagnosis not present

## 2013-07-24 DIAGNOSIS — R7309 Other abnormal glucose: Secondary | ICD-10-CM | POA: Diagnosis not present

## 2013-07-25 DIAGNOSIS — D509 Iron deficiency anemia, unspecified: Secondary | ICD-10-CM | POA: Diagnosis not present

## 2013-07-25 DIAGNOSIS — Z79899 Other long term (current) drug therapy: Secondary | ICD-10-CM | POA: Diagnosis not present

## 2013-07-25 DIAGNOSIS — N2581 Secondary hyperparathyroidism of renal origin: Secondary | ICD-10-CM | POA: Diagnosis not present

## 2013-07-25 DIAGNOSIS — R7309 Other abnormal glucose: Secondary | ICD-10-CM | POA: Diagnosis not present

## 2013-07-25 DIAGNOSIS — N186 End stage renal disease: Secondary | ICD-10-CM | POA: Diagnosis not present

## 2013-07-26 DIAGNOSIS — D509 Iron deficiency anemia, unspecified: Secondary | ICD-10-CM | POA: Diagnosis not present

## 2013-07-26 DIAGNOSIS — N186 End stage renal disease: Secondary | ICD-10-CM | POA: Diagnosis not present

## 2013-07-26 DIAGNOSIS — Z79899 Other long term (current) drug therapy: Secondary | ICD-10-CM | POA: Diagnosis not present

## 2013-07-26 DIAGNOSIS — R7309 Other abnormal glucose: Secondary | ICD-10-CM | POA: Diagnosis not present

## 2013-07-26 DIAGNOSIS — N2581 Secondary hyperparathyroidism of renal origin: Secondary | ICD-10-CM | POA: Diagnosis not present

## 2013-07-27 DIAGNOSIS — R7309 Other abnormal glucose: Secondary | ICD-10-CM | POA: Diagnosis not present

## 2013-07-27 DIAGNOSIS — D509 Iron deficiency anemia, unspecified: Secondary | ICD-10-CM | POA: Diagnosis not present

## 2013-07-27 DIAGNOSIS — N186 End stage renal disease: Secondary | ICD-10-CM | POA: Diagnosis not present

## 2013-07-27 DIAGNOSIS — N2581 Secondary hyperparathyroidism of renal origin: Secondary | ICD-10-CM | POA: Diagnosis not present

## 2013-07-27 DIAGNOSIS — Z79899 Other long term (current) drug therapy: Secondary | ICD-10-CM | POA: Diagnosis not present

## 2013-07-28 DIAGNOSIS — Z79899 Other long term (current) drug therapy: Secondary | ICD-10-CM | POA: Diagnosis not present

## 2013-07-28 DIAGNOSIS — N2581 Secondary hyperparathyroidism of renal origin: Secondary | ICD-10-CM | POA: Diagnosis not present

## 2013-07-28 DIAGNOSIS — N186 End stage renal disease: Secondary | ICD-10-CM | POA: Diagnosis not present

## 2013-07-28 DIAGNOSIS — R7309 Other abnormal glucose: Secondary | ICD-10-CM | POA: Diagnosis not present

## 2013-07-28 DIAGNOSIS — D509 Iron deficiency anemia, unspecified: Secondary | ICD-10-CM | POA: Diagnosis not present

## 2013-07-29 DIAGNOSIS — N186 End stage renal disease: Secondary | ICD-10-CM | POA: Diagnosis not present

## 2013-07-29 DIAGNOSIS — R7309 Other abnormal glucose: Secondary | ICD-10-CM | POA: Diagnosis not present

## 2013-07-29 DIAGNOSIS — N2581 Secondary hyperparathyroidism of renal origin: Secondary | ICD-10-CM | POA: Diagnosis not present

## 2013-07-29 DIAGNOSIS — Z79899 Other long term (current) drug therapy: Secondary | ICD-10-CM | POA: Diagnosis not present

## 2013-07-29 DIAGNOSIS — D509 Iron deficiency anemia, unspecified: Secondary | ICD-10-CM | POA: Diagnosis not present

## 2013-07-30 DIAGNOSIS — R7309 Other abnormal glucose: Secondary | ICD-10-CM | POA: Diagnosis not present

## 2013-07-30 DIAGNOSIS — D509 Iron deficiency anemia, unspecified: Secondary | ICD-10-CM | POA: Diagnosis not present

## 2013-07-30 DIAGNOSIS — Z008 Encounter for other general examination: Secondary | ICD-10-CM | POA: Diagnosis not present

## 2013-07-30 DIAGNOSIS — Z79899 Other long term (current) drug therapy: Secondary | ICD-10-CM | POA: Diagnosis not present

## 2013-07-30 DIAGNOSIS — N2581 Secondary hyperparathyroidism of renal origin: Secondary | ICD-10-CM | POA: Diagnosis not present

## 2013-07-30 DIAGNOSIS — N186 End stage renal disease: Secondary | ICD-10-CM | POA: Diagnosis not present

## 2013-07-31 DIAGNOSIS — D509 Iron deficiency anemia, unspecified: Secondary | ICD-10-CM | POA: Diagnosis not present

## 2013-07-31 DIAGNOSIS — Z79899 Other long term (current) drug therapy: Secondary | ICD-10-CM | POA: Diagnosis not present

## 2013-07-31 DIAGNOSIS — N186 End stage renal disease: Secondary | ICD-10-CM | POA: Diagnosis not present

## 2013-07-31 DIAGNOSIS — N2581 Secondary hyperparathyroidism of renal origin: Secondary | ICD-10-CM | POA: Diagnosis not present

## 2013-07-31 DIAGNOSIS — R7309 Other abnormal glucose: Secondary | ICD-10-CM | POA: Diagnosis not present

## 2013-08-01 DIAGNOSIS — Z79899 Other long term (current) drug therapy: Secondary | ICD-10-CM | POA: Diagnosis not present

## 2013-08-01 DIAGNOSIS — R7309 Other abnormal glucose: Secondary | ICD-10-CM | POA: Diagnosis not present

## 2013-08-01 DIAGNOSIS — D509 Iron deficiency anemia, unspecified: Secondary | ICD-10-CM | POA: Diagnosis not present

## 2013-08-01 DIAGNOSIS — N2581 Secondary hyperparathyroidism of renal origin: Secondary | ICD-10-CM | POA: Diagnosis not present

## 2013-08-01 DIAGNOSIS — N186 End stage renal disease: Secondary | ICD-10-CM | POA: Diagnosis not present

## 2013-08-02 DIAGNOSIS — Z79899 Other long term (current) drug therapy: Secondary | ICD-10-CM | POA: Diagnosis not present

## 2013-08-02 DIAGNOSIS — D509 Iron deficiency anemia, unspecified: Secondary | ICD-10-CM | POA: Diagnosis not present

## 2013-08-02 DIAGNOSIS — N186 End stage renal disease: Secondary | ICD-10-CM | POA: Diagnosis not present

## 2013-08-02 DIAGNOSIS — R7309 Other abnormal glucose: Secondary | ICD-10-CM | POA: Diagnosis not present

## 2013-08-02 DIAGNOSIS — N2581 Secondary hyperparathyroidism of renal origin: Secondary | ICD-10-CM | POA: Diagnosis not present

## 2013-08-03 DIAGNOSIS — R7309 Other abnormal glucose: Secondary | ICD-10-CM | POA: Diagnosis not present

## 2013-08-03 DIAGNOSIS — N2581 Secondary hyperparathyroidism of renal origin: Secondary | ICD-10-CM | POA: Diagnosis not present

## 2013-08-03 DIAGNOSIS — Z79899 Other long term (current) drug therapy: Secondary | ICD-10-CM | POA: Diagnosis not present

## 2013-08-03 DIAGNOSIS — D509 Iron deficiency anemia, unspecified: Secondary | ICD-10-CM | POA: Diagnosis not present

## 2013-08-03 DIAGNOSIS — N186 End stage renal disease: Secondary | ICD-10-CM | POA: Diagnosis not present

## 2013-08-04 DIAGNOSIS — Z79899 Other long term (current) drug therapy: Secondary | ICD-10-CM | POA: Diagnosis not present

## 2013-08-04 DIAGNOSIS — N186 End stage renal disease: Secondary | ICD-10-CM | POA: Diagnosis not present

## 2013-08-04 DIAGNOSIS — N2581 Secondary hyperparathyroidism of renal origin: Secondary | ICD-10-CM | POA: Diagnosis not present

## 2013-08-04 DIAGNOSIS — D509 Iron deficiency anemia, unspecified: Secondary | ICD-10-CM | POA: Diagnosis not present

## 2013-08-04 DIAGNOSIS — R7309 Other abnormal glucose: Secondary | ICD-10-CM | POA: Diagnosis not present

## 2013-08-05 DIAGNOSIS — N186 End stage renal disease: Secondary | ICD-10-CM | POA: Diagnosis not present

## 2013-08-05 DIAGNOSIS — D509 Iron deficiency anemia, unspecified: Secondary | ICD-10-CM | POA: Diagnosis not present

## 2013-08-05 DIAGNOSIS — R7309 Other abnormal glucose: Secondary | ICD-10-CM | POA: Diagnosis not present

## 2013-08-05 DIAGNOSIS — Z79899 Other long term (current) drug therapy: Secondary | ICD-10-CM | POA: Diagnosis not present

## 2013-08-05 DIAGNOSIS — N2581 Secondary hyperparathyroidism of renal origin: Secondary | ICD-10-CM | POA: Diagnosis not present

## 2013-08-06 DIAGNOSIS — N2581 Secondary hyperparathyroidism of renal origin: Secondary | ICD-10-CM | POA: Diagnosis not present

## 2013-08-06 DIAGNOSIS — R7309 Other abnormal glucose: Secondary | ICD-10-CM | POA: Diagnosis not present

## 2013-08-06 DIAGNOSIS — D509 Iron deficiency anemia, unspecified: Secondary | ICD-10-CM | POA: Diagnosis not present

## 2013-08-06 DIAGNOSIS — Z79899 Other long term (current) drug therapy: Secondary | ICD-10-CM | POA: Diagnosis not present

## 2013-08-06 DIAGNOSIS — N186 End stage renal disease: Secondary | ICD-10-CM | POA: Diagnosis not present

## 2013-08-07 DIAGNOSIS — Z79899 Other long term (current) drug therapy: Secondary | ICD-10-CM | POA: Diagnosis not present

## 2013-08-07 DIAGNOSIS — Z01419 Encounter for gynecological examination (general) (routine) without abnormal findings: Secondary | ICD-10-CM | POA: Diagnosis not present

## 2013-08-07 DIAGNOSIS — N186 End stage renal disease: Secondary | ICD-10-CM | POA: Diagnosis not present

## 2013-08-07 DIAGNOSIS — Z130 Encounter for screening for diseases of the blood and blood-forming organs and certain disorders involving the immune mechanism: Secondary | ICD-10-CM | POA: Diagnosis not present

## 2013-08-07 DIAGNOSIS — R7309 Other abnormal glucose: Secondary | ICD-10-CM | POA: Diagnosis not present

## 2013-08-07 DIAGNOSIS — Z1231 Encounter for screening mammogram for malignant neoplasm of breast: Secondary | ICD-10-CM | POA: Diagnosis not present

## 2013-08-07 DIAGNOSIS — N289 Disorder of kidney and ureter, unspecified: Secondary | ICD-10-CM | POA: Diagnosis not present

## 2013-08-07 DIAGNOSIS — D509 Iron deficiency anemia, unspecified: Secondary | ICD-10-CM | POA: Diagnosis not present

## 2013-08-07 DIAGNOSIS — Z008 Encounter for other general examination: Secondary | ICD-10-CM | POA: Diagnosis not present

## 2013-08-07 DIAGNOSIS — N2581 Secondary hyperparathyroidism of renal origin: Secondary | ICD-10-CM | POA: Diagnosis not present

## 2013-08-07 DIAGNOSIS — Z124 Encounter for screening for malignant neoplasm of cervix: Secondary | ICD-10-CM | POA: Diagnosis not present

## 2013-08-08 DIAGNOSIS — Z79899 Other long term (current) drug therapy: Secondary | ICD-10-CM | POA: Diagnosis not present

## 2013-08-08 DIAGNOSIS — D509 Iron deficiency anemia, unspecified: Secondary | ICD-10-CM | POA: Diagnosis not present

## 2013-08-08 DIAGNOSIS — N186 End stage renal disease: Secondary | ICD-10-CM | POA: Diagnosis not present

## 2013-08-08 DIAGNOSIS — R7309 Other abnormal glucose: Secondary | ICD-10-CM | POA: Diagnosis not present

## 2013-08-08 DIAGNOSIS — N2581 Secondary hyperparathyroidism of renal origin: Secondary | ICD-10-CM | POA: Diagnosis not present

## 2013-08-09 ENCOUNTER — Other Ambulatory Visit: Payer: Self-pay | Admitting: Obstetrics & Gynecology

## 2013-08-09 DIAGNOSIS — D509 Iron deficiency anemia, unspecified: Secondary | ICD-10-CM | POA: Diagnosis not present

## 2013-08-09 DIAGNOSIS — N186 End stage renal disease: Secondary | ICD-10-CM | POA: Diagnosis not present

## 2013-08-09 DIAGNOSIS — Z79899 Other long term (current) drug therapy: Secondary | ICD-10-CM | POA: Diagnosis not present

## 2013-08-09 DIAGNOSIS — N2581 Secondary hyperparathyroidism of renal origin: Secondary | ICD-10-CM | POA: Diagnosis not present

## 2013-08-09 DIAGNOSIS — R928 Other abnormal and inconclusive findings on diagnostic imaging of breast: Secondary | ICD-10-CM

## 2013-08-09 DIAGNOSIS — R7309 Other abnormal glucose: Secondary | ICD-10-CM | POA: Diagnosis not present

## 2013-08-10 DIAGNOSIS — N186 End stage renal disease: Secondary | ICD-10-CM | POA: Diagnosis not present

## 2013-08-10 DIAGNOSIS — D509 Iron deficiency anemia, unspecified: Secondary | ICD-10-CM | POA: Diagnosis not present

## 2013-08-10 DIAGNOSIS — R7309 Other abnormal glucose: Secondary | ICD-10-CM | POA: Diagnosis not present

## 2013-08-10 DIAGNOSIS — Z79899 Other long term (current) drug therapy: Secondary | ICD-10-CM | POA: Diagnosis not present

## 2013-08-10 DIAGNOSIS — N2581 Secondary hyperparathyroidism of renal origin: Secondary | ICD-10-CM | POA: Diagnosis not present

## 2013-08-11 DIAGNOSIS — N186 End stage renal disease: Secondary | ICD-10-CM | POA: Diagnosis not present

## 2013-08-11 DIAGNOSIS — R7309 Other abnormal glucose: Secondary | ICD-10-CM | POA: Diagnosis not present

## 2013-08-11 DIAGNOSIS — N2581 Secondary hyperparathyroidism of renal origin: Secondary | ICD-10-CM | POA: Diagnosis not present

## 2013-08-11 DIAGNOSIS — D509 Iron deficiency anemia, unspecified: Secondary | ICD-10-CM | POA: Diagnosis not present

## 2013-08-11 DIAGNOSIS — Z79899 Other long term (current) drug therapy: Secondary | ICD-10-CM | POA: Diagnosis not present

## 2013-08-12 DIAGNOSIS — N186 End stage renal disease: Secondary | ICD-10-CM | POA: Diagnosis not present

## 2013-08-12 DIAGNOSIS — N2581 Secondary hyperparathyroidism of renal origin: Secondary | ICD-10-CM | POA: Diagnosis not present

## 2013-08-12 DIAGNOSIS — D509 Iron deficiency anemia, unspecified: Secondary | ICD-10-CM | POA: Diagnosis not present

## 2013-08-12 DIAGNOSIS — Z79899 Other long term (current) drug therapy: Secondary | ICD-10-CM | POA: Diagnosis not present

## 2013-08-12 DIAGNOSIS — R7309 Other abnormal glucose: Secondary | ICD-10-CM | POA: Diagnosis not present

## 2013-08-13 DIAGNOSIS — Z79899 Other long term (current) drug therapy: Secondary | ICD-10-CM | POA: Diagnosis not present

## 2013-08-13 DIAGNOSIS — N186 End stage renal disease: Secondary | ICD-10-CM | POA: Diagnosis not present

## 2013-08-13 DIAGNOSIS — N2581 Secondary hyperparathyroidism of renal origin: Secondary | ICD-10-CM | POA: Diagnosis not present

## 2013-08-13 DIAGNOSIS — D509 Iron deficiency anemia, unspecified: Secondary | ICD-10-CM | POA: Diagnosis not present

## 2013-08-13 DIAGNOSIS — R7309 Other abnormal glucose: Secondary | ICD-10-CM | POA: Diagnosis not present

## 2013-08-14 DIAGNOSIS — Z79899 Other long term (current) drug therapy: Secondary | ICD-10-CM | POA: Diagnosis not present

## 2013-08-14 DIAGNOSIS — N186 End stage renal disease: Secondary | ICD-10-CM | POA: Diagnosis not present

## 2013-08-14 DIAGNOSIS — D509 Iron deficiency anemia, unspecified: Secondary | ICD-10-CM | POA: Diagnosis not present

## 2013-08-14 DIAGNOSIS — N2581 Secondary hyperparathyroidism of renal origin: Secondary | ICD-10-CM | POA: Diagnosis not present

## 2013-08-14 DIAGNOSIS — R7309 Other abnormal glucose: Secondary | ICD-10-CM | POA: Diagnosis not present

## 2013-08-15 DIAGNOSIS — D509 Iron deficiency anemia, unspecified: Secondary | ICD-10-CM | POA: Diagnosis not present

## 2013-08-15 DIAGNOSIS — R7309 Other abnormal glucose: Secondary | ICD-10-CM | POA: Diagnosis not present

## 2013-08-15 DIAGNOSIS — N2581 Secondary hyperparathyroidism of renal origin: Secondary | ICD-10-CM | POA: Diagnosis not present

## 2013-08-15 DIAGNOSIS — Z79899 Other long term (current) drug therapy: Secondary | ICD-10-CM | POA: Diagnosis not present

## 2013-08-15 DIAGNOSIS — N186 End stage renal disease: Secondary | ICD-10-CM | POA: Diagnosis not present

## 2013-08-16 DIAGNOSIS — D509 Iron deficiency anemia, unspecified: Secondary | ICD-10-CM | POA: Diagnosis not present

## 2013-08-16 DIAGNOSIS — N2581 Secondary hyperparathyroidism of renal origin: Secondary | ICD-10-CM | POA: Diagnosis not present

## 2013-08-16 DIAGNOSIS — R7309 Other abnormal glucose: Secondary | ICD-10-CM | POA: Diagnosis not present

## 2013-08-16 DIAGNOSIS — Z79899 Other long term (current) drug therapy: Secondary | ICD-10-CM | POA: Diagnosis not present

## 2013-08-16 DIAGNOSIS — N186 End stage renal disease: Secondary | ICD-10-CM | POA: Diagnosis not present

## 2013-08-17 DIAGNOSIS — N2581 Secondary hyperparathyroidism of renal origin: Secondary | ICD-10-CM | POA: Diagnosis not present

## 2013-08-17 DIAGNOSIS — R7309 Other abnormal glucose: Secondary | ICD-10-CM | POA: Diagnosis not present

## 2013-08-17 DIAGNOSIS — N186 End stage renal disease: Secondary | ICD-10-CM | POA: Diagnosis not present

## 2013-08-17 DIAGNOSIS — Z79899 Other long term (current) drug therapy: Secondary | ICD-10-CM | POA: Diagnosis not present

## 2013-08-17 DIAGNOSIS — D509 Iron deficiency anemia, unspecified: Secondary | ICD-10-CM | POA: Diagnosis not present

## 2013-08-18 DIAGNOSIS — Z79899 Other long term (current) drug therapy: Secondary | ICD-10-CM | POA: Diagnosis not present

## 2013-08-18 DIAGNOSIS — D509 Iron deficiency anemia, unspecified: Secondary | ICD-10-CM | POA: Diagnosis not present

## 2013-08-18 DIAGNOSIS — N186 End stage renal disease: Secondary | ICD-10-CM | POA: Diagnosis not present

## 2013-08-18 DIAGNOSIS — R7309 Other abnormal glucose: Secondary | ICD-10-CM | POA: Diagnosis not present

## 2013-08-18 DIAGNOSIS — N2581 Secondary hyperparathyroidism of renal origin: Secondary | ICD-10-CM | POA: Diagnosis not present

## 2013-08-19 ENCOUNTER — Ambulatory Visit
Admission: RE | Admit: 2013-08-19 | Discharge: 2013-08-19 | Disposition: A | Discharge status: Home / Self Care | Payer: Medicare Other | Source: Ambulatory Visit | Attending: Obstetrics & Gynecology | Admitting: Obstetrics & Gynecology

## 2013-08-19 DIAGNOSIS — R7309 Other abnormal glucose: Secondary | ICD-10-CM | POA: Diagnosis not present

## 2013-08-19 DIAGNOSIS — N186 End stage renal disease: Secondary | ICD-10-CM | POA: Diagnosis not present

## 2013-08-19 DIAGNOSIS — N6049 Mammary duct ectasia of unspecified breast: Secondary | ICD-10-CM | POA: Diagnosis not present

## 2013-08-19 DIAGNOSIS — R928 Other abnormal and inconclusive findings on diagnostic imaging of breast: Secondary | ICD-10-CM

## 2013-08-19 DIAGNOSIS — D509 Iron deficiency anemia, unspecified: Secondary | ICD-10-CM | POA: Diagnosis not present

## 2013-08-19 DIAGNOSIS — N2581 Secondary hyperparathyroidism of renal origin: Secondary | ICD-10-CM | POA: Diagnosis not present

## 2013-08-19 DIAGNOSIS — Z79899 Other long term (current) drug therapy: Secondary | ICD-10-CM | POA: Diagnosis not present

## 2013-08-20 DIAGNOSIS — D509 Iron deficiency anemia, unspecified: Secondary | ICD-10-CM | POA: Diagnosis not present

## 2013-08-20 DIAGNOSIS — N186 End stage renal disease: Secondary | ICD-10-CM | POA: Diagnosis not present

## 2013-08-20 DIAGNOSIS — N2581 Secondary hyperparathyroidism of renal origin: Secondary | ICD-10-CM | POA: Diagnosis not present

## 2013-08-20 DIAGNOSIS — R7309 Other abnormal glucose: Secondary | ICD-10-CM | POA: Diagnosis not present

## 2013-08-20 DIAGNOSIS — Z79899 Other long term (current) drug therapy: Secondary | ICD-10-CM | POA: Diagnosis not present

## 2013-08-21 DIAGNOSIS — Z008 Encounter for other general examination: Secondary | ICD-10-CM | POA: Diagnosis not present

## 2013-08-21 DIAGNOSIS — R7309 Other abnormal glucose: Secondary | ICD-10-CM | POA: Diagnosis not present

## 2013-08-21 DIAGNOSIS — D72819 Decreased white blood cell count, unspecified: Secondary | ICD-10-CM | POA: Diagnosis not present

## 2013-08-21 DIAGNOSIS — D509 Iron deficiency anemia, unspecified: Secondary | ICD-10-CM | POA: Diagnosis not present

## 2013-08-21 DIAGNOSIS — N186 End stage renal disease: Secondary | ICD-10-CM | POA: Diagnosis not present

## 2013-08-21 DIAGNOSIS — Z79899 Other long term (current) drug therapy: Secondary | ICD-10-CM | POA: Diagnosis not present

## 2013-08-21 DIAGNOSIS — N2581 Secondary hyperparathyroidism of renal origin: Secondary | ICD-10-CM | POA: Diagnosis not present

## 2013-08-22 DIAGNOSIS — D509 Iron deficiency anemia, unspecified: Secondary | ICD-10-CM | POA: Diagnosis not present

## 2013-08-22 DIAGNOSIS — Z79899 Other long term (current) drug therapy: Secondary | ICD-10-CM | POA: Diagnosis not present

## 2013-08-22 DIAGNOSIS — N2581 Secondary hyperparathyroidism of renal origin: Secondary | ICD-10-CM | POA: Diagnosis not present

## 2013-08-22 DIAGNOSIS — R7309 Other abnormal glucose: Secondary | ICD-10-CM | POA: Diagnosis not present

## 2013-08-22 DIAGNOSIS — N186 End stage renal disease: Secondary | ICD-10-CM | POA: Diagnosis not present

## 2013-08-23 DIAGNOSIS — Z79899 Other long term (current) drug therapy: Secondary | ICD-10-CM | POA: Diagnosis not present

## 2013-08-23 DIAGNOSIS — R7309 Other abnormal glucose: Secondary | ICD-10-CM | POA: Diagnosis not present

## 2013-08-23 DIAGNOSIS — D509 Iron deficiency anemia, unspecified: Secondary | ICD-10-CM | POA: Diagnosis not present

## 2013-08-23 DIAGNOSIS — N2581 Secondary hyperparathyroidism of renal origin: Secondary | ICD-10-CM | POA: Diagnosis not present

## 2013-08-23 DIAGNOSIS — N186 End stage renal disease: Secondary | ICD-10-CM | POA: Diagnosis not present

## 2013-08-24 DIAGNOSIS — N2581 Secondary hyperparathyroidism of renal origin: Secondary | ICD-10-CM | POA: Diagnosis not present

## 2013-08-24 DIAGNOSIS — D509 Iron deficiency anemia, unspecified: Secondary | ICD-10-CM | POA: Diagnosis not present

## 2013-08-24 DIAGNOSIS — N186 End stage renal disease: Secondary | ICD-10-CM | POA: Diagnosis not present

## 2013-08-24 DIAGNOSIS — Z79899 Other long term (current) drug therapy: Secondary | ICD-10-CM | POA: Diagnosis not present

## 2013-08-24 DIAGNOSIS — R7309 Other abnormal glucose: Secondary | ICD-10-CM | POA: Diagnosis not present

## 2013-08-25 DIAGNOSIS — N2581 Secondary hyperparathyroidism of renal origin: Secondary | ICD-10-CM | POA: Diagnosis not present

## 2013-08-25 DIAGNOSIS — N186 End stage renal disease: Secondary | ICD-10-CM | POA: Diagnosis not present

## 2013-08-25 DIAGNOSIS — Z79899 Other long term (current) drug therapy: Secondary | ICD-10-CM | POA: Diagnosis not present

## 2013-08-25 DIAGNOSIS — D509 Iron deficiency anemia, unspecified: Secondary | ICD-10-CM | POA: Diagnosis not present

## 2013-08-25 DIAGNOSIS — R7309 Other abnormal glucose: Secondary | ICD-10-CM | POA: Diagnosis not present

## 2013-08-26 DIAGNOSIS — R7309 Other abnormal glucose: Secondary | ICD-10-CM | POA: Diagnosis not present

## 2013-08-26 DIAGNOSIS — N2581 Secondary hyperparathyroidism of renal origin: Secondary | ICD-10-CM | POA: Diagnosis not present

## 2013-08-26 DIAGNOSIS — N186 End stage renal disease: Secondary | ICD-10-CM | POA: Diagnosis not present

## 2013-08-26 DIAGNOSIS — D509 Iron deficiency anemia, unspecified: Secondary | ICD-10-CM | POA: Diagnosis not present

## 2013-08-26 DIAGNOSIS — Z79899 Other long term (current) drug therapy: Secondary | ICD-10-CM | POA: Diagnosis not present

## 2013-08-27 DIAGNOSIS — Z79899 Other long term (current) drug therapy: Secondary | ICD-10-CM | POA: Diagnosis not present

## 2013-08-27 DIAGNOSIS — R7309 Other abnormal glucose: Secondary | ICD-10-CM | POA: Diagnosis not present

## 2013-08-27 DIAGNOSIS — N186 End stage renal disease: Secondary | ICD-10-CM | POA: Diagnosis not present

## 2013-08-27 DIAGNOSIS — N2581 Secondary hyperparathyroidism of renal origin: Secondary | ICD-10-CM | POA: Diagnosis not present

## 2013-08-27 DIAGNOSIS — D509 Iron deficiency anemia, unspecified: Secondary | ICD-10-CM | POA: Diagnosis not present

## 2013-08-28 DIAGNOSIS — N186 End stage renal disease: Secondary | ICD-10-CM | POA: Diagnosis not present

## 2013-08-28 DIAGNOSIS — D509 Iron deficiency anemia, unspecified: Secondary | ICD-10-CM | POA: Diagnosis not present

## 2013-08-28 DIAGNOSIS — R7309 Other abnormal glucose: Secondary | ICD-10-CM | POA: Diagnosis not present

## 2013-08-29 DIAGNOSIS — E785 Hyperlipidemia, unspecified: Secondary | ICD-10-CM | POA: Diagnosis not present

## 2013-09-13 DIAGNOSIS — N186 End stage renal disease: Secondary | ICD-10-CM | POA: Diagnosis not present

## 2013-09-25 DIAGNOSIS — N186 End stage renal disease: Secondary | ICD-10-CM | POA: Diagnosis not present

## 2013-09-27 DIAGNOSIS — N186 End stage renal disease: Secondary | ICD-10-CM | POA: Diagnosis not present

## 2013-09-28 DIAGNOSIS — N186 End stage renal disease: Secondary | ICD-10-CM | POA: Diagnosis not present

## 2013-09-28 DIAGNOSIS — D631 Anemia in chronic kidney disease: Secondary | ICD-10-CM | POA: Diagnosis not present

## 2013-09-28 DIAGNOSIS — N039 Chronic nephritic syndrome with unspecified morphologic changes: Secondary | ICD-10-CM | POA: Diagnosis not present

## 2013-09-28 DIAGNOSIS — D509 Iron deficiency anemia, unspecified: Secondary | ICD-10-CM | POA: Diagnosis not present

## 2013-09-28 DIAGNOSIS — N2581 Secondary hyperparathyroidism of renal origin: Secondary | ICD-10-CM | POA: Diagnosis not present

## 2013-09-28 DIAGNOSIS — Z79899 Other long term (current) drug therapy: Secondary | ICD-10-CM | POA: Diagnosis not present

## 2013-09-29 DIAGNOSIS — N186 End stage renal disease: Secondary | ICD-10-CM | POA: Diagnosis not present

## 2013-09-29 DIAGNOSIS — N2581 Secondary hyperparathyroidism of renal origin: Secondary | ICD-10-CM | POA: Diagnosis not present

## 2013-09-29 DIAGNOSIS — Z79899 Other long term (current) drug therapy: Secondary | ICD-10-CM | POA: Diagnosis not present

## 2013-09-29 DIAGNOSIS — D631 Anemia in chronic kidney disease: Secondary | ICD-10-CM | POA: Diagnosis not present

## 2013-09-29 DIAGNOSIS — D509 Iron deficiency anemia, unspecified: Secondary | ICD-10-CM | POA: Diagnosis not present

## 2013-09-30 DIAGNOSIS — N186 End stage renal disease: Secondary | ICD-10-CM | POA: Diagnosis not present

## 2013-09-30 DIAGNOSIS — N039 Chronic nephritic syndrome with unspecified morphologic changes: Secondary | ICD-10-CM | POA: Diagnosis not present

## 2013-09-30 DIAGNOSIS — D631 Anemia in chronic kidney disease: Secondary | ICD-10-CM | POA: Diagnosis not present

## 2013-09-30 DIAGNOSIS — N2581 Secondary hyperparathyroidism of renal origin: Secondary | ICD-10-CM | POA: Diagnosis not present

## 2013-09-30 DIAGNOSIS — Z79899 Other long term (current) drug therapy: Secondary | ICD-10-CM | POA: Diagnosis not present

## 2013-09-30 DIAGNOSIS — D509 Iron deficiency anemia, unspecified: Secondary | ICD-10-CM | POA: Diagnosis not present

## 2013-10-01 DIAGNOSIS — N2581 Secondary hyperparathyroidism of renal origin: Secondary | ICD-10-CM | POA: Diagnosis not present

## 2013-10-01 DIAGNOSIS — D631 Anemia in chronic kidney disease: Secondary | ICD-10-CM | POA: Diagnosis not present

## 2013-10-01 DIAGNOSIS — Z79899 Other long term (current) drug therapy: Secondary | ICD-10-CM | POA: Diagnosis not present

## 2013-10-01 DIAGNOSIS — D509 Iron deficiency anemia, unspecified: Secondary | ICD-10-CM | POA: Diagnosis not present

## 2013-10-01 DIAGNOSIS — N186 End stage renal disease: Secondary | ICD-10-CM | POA: Diagnosis not present

## 2013-10-02 DIAGNOSIS — N186 End stage renal disease: Secondary | ICD-10-CM | POA: Diagnosis not present

## 2013-10-02 DIAGNOSIS — N039 Chronic nephritic syndrome with unspecified morphologic changes: Secondary | ICD-10-CM | POA: Diagnosis not present

## 2013-10-02 DIAGNOSIS — D509 Iron deficiency anemia, unspecified: Secondary | ICD-10-CM | POA: Diagnosis not present

## 2013-10-02 DIAGNOSIS — D631 Anemia in chronic kidney disease: Secondary | ICD-10-CM | POA: Diagnosis not present

## 2013-10-02 DIAGNOSIS — N2581 Secondary hyperparathyroidism of renal origin: Secondary | ICD-10-CM | POA: Diagnosis not present

## 2013-10-02 DIAGNOSIS — Z79899 Other long term (current) drug therapy: Secondary | ICD-10-CM | POA: Diagnosis not present

## 2013-10-03 DIAGNOSIS — Z79899 Other long term (current) drug therapy: Secondary | ICD-10-CM | POA: Diagnosis not present

## 2013-10-03 DIAGNOSIS — D509 Iron deficiency anemia, unspecified: Secondary | ICD-10-CM | POA: Diagnosis not present

## 2013-10-03 DIAGNOSIS — N186 End stage renal disease: Secondary | ICD-10-CM | POA: Diagnosis not present

## 2013-10-03 DIAGNOSIS — N2581 Secondary hyperparathyroidism of renal origin: Secondary | ICD-10-CM | POA: Diagnosis not present

## 2013-10-03 DIAGNOSIS — D631 Anemia in chronic kidney disease: Secondary | ICD-10-CM | POA: Diagnosis not present

## 2013-10-04 DIAGNOSIS — N186 End stage renal disease: Secondary | ICD-10-CM | POA: Diagnosis not present

## 2013-10-04 DIAGNOSIS — Z79899 Other long term (current) drug therapy: Secondary | ICD-10-CM | POA: Diagnosis not present

## 2013-10-04 DIAGNOSIS — N2581 Secondary hyperparathyroidism of renal origin: Secondary | ICD-10-CM | POA: Diagnosis not present

## 2013-10-04 DIAGNOSIS — D631 Anemia in chronic kidney disease: Secondary | ICD-10-CM | POA: Diagnosis not present

## 2013-10-04 DIAGNOSIS — D509 Iron deficiency anemia, unspecified: Secondary | ICD-10-CM | POA: Diagnosis not present

## 2013-10-05 DIAGNOSIS — N039 Chronic nephritic syndrome with unspecified morphologic changes: Secondary | ICD-10-CM | POA: Diagnosis not present

## 2013-10-05 DIAGNOSIS — N186 End stage renal disease: Secondary | ICD-10-CM | POA: Diagnosis not present

## 2013-10-05 DIAGNOSIS — Z79899 Other long term (current) drug therapy: Secondary | ICD-10-CM | POA: Diagnosis not present

## 2013-10-05 DIAGNOSIS — N2581 Secondary hyperparathyroidism of renal origin: Secondary | ICD-10-CM | POA: Diagnosis not present

## 2013-10-05 DIAGNOSIS — D631 Anemia in chronic kidney disease: Secondary | ICD-10-CM | POA: Diagnosis not present

## 2013-10-05 DIAGNOSIS — D509 Iron deficiency anemia, unspecified: Secondary | ICD-10-CM | POA: Diagnosis not present

## 2013-10-06 DIAGNOSIS — D509 Iron deficiency anemia, unspecified: Secondary | ICD-10-CM | POA: Diagnosis not present

## 2013-10-06 DIAGNOSIS — D631 Anemia in chronic kidney disease: Secondary | ICD-10-CM | POA: Diagnosis not present

## 2013-10-06 DIAGNOSIS — N186 End stage renal disease: Secondary | ICD-10-CM | POA: Diagnosis not present

## 2013-10-06 DIAGNOSIS — N2581 Secondary hyperparathyroidism of renal origin: Secondary | ICD-10-CM | POA: Diagnosis not present

## 2013-10-06 DIAGNOSIS — Z79899 Other long term (current) drug therapy: Secondary | ICD-10-CM | POA: Diagnosis not present

## 2013-10-07 DIAGNOSIS — N2581 Secondary hyperparathyroidism of renal origin: Secondary | ICD-10-CM | POA: Diagnosis not present

## 2013-10-07 DIAGNOSIS — Z79899 Other long term (current) drug therapy: Secondary | ICD-10-CM | POA: Diagnosis not present

## 2013-10-07 DIAGNOSIS — D631 Anemia in chronic kidney disease: Secondary | ICD-10-CM | POA: Diagnosis not present

## 2013-10-07 DIAGNOSIS — N039 Chronic nephritic syndrome with unspecified morphologic changes: Secondary | ICD-10-CM | POA: Diagnosis not present

## 2013-10-07 DIAGNOSIS — D509 Iron deficiency anemia, unspecified: Secondary | ICD-10-CM | POA: Diagnosis not present

## 2013-10-07 DIAGNOSIS — N186 End stage renal disease: Secondary | ICD-10-CM | POA: Diagnosis not present

## 2013-10-08 DIAGNOSIS — D631 Anemia in chronic kidney disease: Secondary | ICD-10-CM | POA: Diagnosis not present

## 2013-10-08 DIAGNOSIS — D509 Iron deficiency anemia, unspecified: Secondary | ICD-10-CM | POA: Diagnosis not present

## 2013-10-08 DIAGNOSIS — N2581 Secondary hyperparathyroidism of renal origin: Secondary | ICD-10-CM | POA: Diagnosis not present

## 2013-10-08 DIAGNOSIS — N186 End stage renal disease: Secondary | ICD-10-CM | POA: Diagnosis not present

## 2013-10-08 DIAGNOSIS — Z79899 Other long term (current) drug therapy: Secondary | ICD-10-CM | POA: Diagnosis not present

## 2013-10-09 DIAGNOSIS — D631 Anemia in chronic kidney disease: Secondary | ICD-10-CM | POA: Diagnosis not present

## 2013-10-09 DIAGNOSIS — N2581 Secondary hyperparathyroidism of renal origin: Secondary | ICD-10-CM | POA: Diagnosis not present

## 2013-10-09 DIAGNOSIS — N186 End stage renal disease: Secondary | ICD-10-CM | POA: Diagnosis not present

## 2013-10-09 DIAGNOSIS — D509 Iron deficiency anemia, unspecified: Secondary | ICD-10-CM | POA: Diagnosis not present

## 2013-10-09 DIAGNOSIS — Z79899 Other long term (current) drug therapy: Secondary | ICD-10-CM | POA: Diagnosis not present

## 2013-10-10 DIAGNOSIS — Z79899 Other long term (current) drug therapy: Secondary | ICD-10-CM | POA: Diagnosis not present

## 2013-10-10 DIAGNOSIS — D631 Anemia in chronic kidney disease: Secondary | ICD-10-CM | POA: Diagnosis not present

## 2013-10-10 DIAGNOSIS — N2581 Secondary hyperparathyroidism of renal origin: Secondary | ICD-10-CM | POA: Diagnosis not present

## 2013-10-10 DIAGNOSIS — N186 End stage renal disease: Secondary | ICD-10-CM | POA: Diagnosis not present

## 2013-10-10 DIAGNOSIS — D509 Iron deficiency anemia, unspecified: Secondary | ICD-10-CM | POA: Diagnosis not present

## 2013-10-11 DIAGNOSIS — N186 End stage renal disease: Secondary | ICD-10-CM | POA: Diagnosis not present

## 2013-10-11 DIAGNOSIS — N2581 Secondary hyperparathyroidism of renal origin: Secondary | ICD-10-CM | POA: Diagnosis not present

## 2013-10-11 DIAGNOSIS — Z79899 Other long term (current) drug therapy: Secondary | ICD-10-CM | POA: Diagnosis not present

## 2013-10-11 DIAGNOSIS — D631 Anemia in chronic kidney disease: Secondary | ICD-10-CM | POA: Diagnosis not present

## 2013-10-11 DIAGNOSIS — N039 Chronic nephritic syndrome with unspecified morphologic changes: Secondary | ICD-10-CM | POA: Diagnosis not present

## 2013-10-11 DIAGNOSIS — D509 Iron deficiency anemia, unspecified: Secondary | ICD-10-CM | POA: Diagnosis not present

## 2013-10-12 DIAGNOSIS — N2581 Secondary hyperparathyroidism of renal origin: Secondary | ICD-10-CM | POA: Diagnosis not present

## 2013-10-12 DIAGNOSIS — D509 Iron deficiency anemia, unspecified: Secondary | ICD-10-CM | POA: Diagnosis not present

## 2013-10-12 DIAGNOSIS — N186 End stage renal disease: Secondary | ICD-10-CM | POA: Diagnosis not present

## 2013-10-12 DIAGNOSIS — D631 Anemia in chronic kidney disease: Secondary | ICD-10-CM | POA: Diagnosis not present

## 2013-10-12 DIAGNOSIS — Z79899 Other long term (current) drug therapy: Secondary | ICD-10-CM | POA: Diagnosis not present

## 2013-10-13 DIAGNOSIS — N186 End stage renal disease: Secondary | ICD-10-CM | POA: Diagnosis not present

## 2013-10-13 DIAGNOSIS — D631 Anemia in chronic kidney disease: Secondary | ICD-10-CM | POA: Diagnosis not present

## 2013-10-13 DIAGNOSIS — N2581 Secondary hyperparathyroidism of renal origin: Secondary | ICD-10-CM | POA: Diagnosis not present

## 2013-10-13 DIAGNOSIS — D509 Iron deficiency anemia, unspecified: Secondary | ICD-10-CM | POA: Diagnosis not present

## 2013-10-13 DIAGNOSIS — Z79899 Other long term (current) drug therapy: Secondary | ICD-10-CM | POA: Diagnosis not present

## 2013-10-13 DIAGNOSIS — N039 Chronic nephritic syndrome with unspecified morphologic changes: Secondary | ICD-10-CM | POA: Diagnosis not present

## 2013-10-14 DIAGNOSIS — D631 Anemia in chronic kidney disease: Secondary | ICD-10-CM | POA: Diagnosis not present

## 2013-10-14 DIAGNOSIS — N2581 Secondary hyperparathyroidism of renal origin: Secondary | ICD-10-CM | POA: Diagnosis not present

## 2013-10-14 DIAGNOSIS — N186 End stage renal disease: Secondary | ICD-10-CM | POA: Diagnosis not present

## 2013-10-14 DIAGNOSIS — D509 Iron deficiency anemia, unspecified: Secondary | ICD-10-CM | POA: Diagnosis not present

## 2013-10-14 DIAGNOSIS — Z79899 Other long term (current) drug therapy: Secondary | ICD-10-CM | POA: Diagnosis not present

## 2013-10-15 DIAGNOSIS — N2581 Secondary hyperparathyroidism of renal origin: Secondary | ICD-10-CM | POA: Diagnosis not present

## 2013-10-15 DIAGNOSIS — Z79899 Other long term (current) drug therapy: Secondary | ICD-10-CM | POA: Diagnosis not present

## 2013-10-15 DIAGNOSIS — N186 End stage renal disease: Secondary | ICD-10-CM | POA: Diagnosis not present

## 2013-10-15 DIAGNOSIS — D631 Anemia in chronic kidney disease: Secondary | ICD-10-CM | POA: Diagnosis not present

## 2013-10-15 DIAGNOSIS — D509 Iron deficiency anemia, unspecified: Secondary | ICD-10-CM | POA: Diagnosis not present

## 2013-10-16 DIAGNOSIS — N2581 Secondary hyperparathyroidism of renal origin: Secondary | ICD-10-CM | POA: Diagnosis not present

## 2013-10-16 DIAGNOSIS — D509 Iron deficiency anemia, unspecified: Secondary | ICD-10-CM | POA: Diagnosis not present

## 2013-10-16 DIAGNOSIS — N186 End stage renal disease: Secondary | ICD-10-CM | POA: Diagnosis not present

## 2013-10-16 DIAGNOSIS — Z79899 Other long term (current) drug therapy: Secondary | ICD-10-CM | POA: Diagnosis not present

## 2013-10-16 DIAGNOSIS — D631 Anemia in chronic kidney disease: Secondary | ICD-10-CM | POA: Diagnosis not present

## 2013-10-17 DIAGNOSIS — N186 End stage renal disease: Secondary | ICD-10-CM | POA: Diagnosis not present

## 2013-10-17 DIAGNOSIS — D509 Iron deficiency anemia, unspecified: Secondary | ICD-10-CM | POA: Diagnosis not present

## 2013-10-17 DIAGNOSIS — Z79899 Other long term (current) drug therapy: Secondary | ICD-10-CM | POA: Diagnosis not present

## 2013-10-17 DIAGNOSIS — N2581 Secondary hyperparathyroidism of renal origin: Secondary | ICD-10-CM | POA: Diagnosis not present

## 2013-10-17 DIAGNOSIS — D631 Anemia in chronic kidney disease: Secondary | ICD-10-CM | POA: Diagnosis not present

## 2013-10-18 DIAGNOSIS — D631 Anemia in chronic kidney disease: Secondary | ICD-10-CM | POA: Diagnosis not present

## 2013-10-18 DIAGNOSIS — Z79899 Other long term (current) drug therapy: Secondary | ICD-10-CM | POA: Diagnosis not present

## 2013-10-18 DIAGNOSIS — N2581 Secondary hyperparathyroidism of renal origin: Secondary | ICD-10-CM | POA: Diagnosis not present

## 2013-10-18 DIAGNOSIS — N186 End stage renal disease: Secondary | ICD-10-CM | POA: Diagnosis not present

## 2013-10-18 DIAGNOSIS — D509 Iron deficiency anemia, unspecified: Secondary | ICD-10-CM | POA: Diagnosis not present

## 2013-10-19 DIAGNOSIS — D631 Anemia in chronic kidney disease: Secondary | ICD-10-CM | POA: Diagnosis not present

## 2013-10-19 DIAGNOSIS — N186 End stage renal disease: Secondary | ICD-10-CM | POA: Diagnosis not present

## 2013-10-19 DIAGNOSIS — N039 Chronic nephritic syndrome with unspecified morphologic changes: Secondary | ICD-10-CM | POA: Diagnosis not present

## 2013-10-19 DIAGNOSIS — D509 Iron deficiency anemia, unspecified: Secondary | ICD-10-CM | POA: Diagnosis not present

## 2013-10-19 DIAGNOSIS — Z79899 Other long term (current) drug therapy: Secondary | ICD-10-CM | POA: Diagnosis not present

## 2013-10-19 DIAGNOSIS — N2581 Secondary hyperparathyroidism of renal origin: Secondary | ICD-10-CM | POA: Diagnosis not present

## 2013-10-20 DIAGNOSIS — Z79899 Other long term (current) drug therapy: Secondary | ICD-10-CM | POA: Diagnosis not present

## 2013-10-20 DIAGNOSIS — N2581 Secondary hyperparathyroidism of renal origin: Secondary | ICD-10-CM | POA: Diagnosis not present

## 2013-10-20 DIAGNOSIS — D631 Anemia in chronic kidney disease: Secondary | ICD-10-CM | POA: Diagnosis not present

## 2013-10-20 DIAGNOSIS — D509 Iron deficiency anemia, unspecified: Secondary | ICD-10-CM | POA: Diagnosis not present

## 2013-10-20 DIAGNOSIS — N186 End stage renal disease: Secondary | ICD-10-CM | POA: Diagnosis not present

## 2013-10-21 DIAGNOSIS — D509 Iron deficiency anemia, unspecified: Secondary | ICD-10-CM | POA: Diagnosis not present

## 2013-10-21 DIAGNOSIS — D631 Anemia in chronic kidney disease: Secondary | ICD-10-CM | POA: Diagnosis not present

## 2013-10-21 DIAGNOSIS — N186 End stage renal disease: Secondary | ICD-10-CM | POA: Diagnosis not present

## 2013-10-21 DIAGNOSIS — Z79899 Other long term (current) drug therapy: Secondary | ICD-10-CM | POA: Diagnosis not present

## 2013-10-21 DIAGNOSIS — N2581 Secondary hyperparathyroidism of renal origin: Secondary | ICD-10-CM | POA: Diagnosis not present

## 2013-10-21 DIAGNOSIS — N039 Chronic nephritic syndrome with unspecified morphologic changes: Secondary | ICD-10-CM | POA: Diagnosis not present

## 2013-10-22 DIAGNOSIS — D631 Anemia in chronic kidney disease: Secondary | ICD-10-CM | POA: Diagnosis not present

## 2013-10-22 DIAGNOSIS — N2581 Secondary hyperparathyroidism of renal origin: Secondary | ICD-10-CM | POA: Diagnosis not present

## 2013-10-22 DIAGNOSIS — N186 End stage renal disease: Secondary | ICD-10-CM | POA: Diagnosis not present

## 2013-10-22 DIAGNOSIS — D509 Iron deficiency anemia, unspecified: Secondary | ICD-10-CM | POA: Diagnosis not present

## 2013-10-22 DIAGNOSIS — Z79899 Other long term (current) drug therapy: Secondary | ICD-10-CM | POA: Diagnosis not present

## 2013-10-23 DIAGNOSIS — N186 End stage renal disease: Secondary | ICD-10-CM | POA: Diagnosis not present

## 2013-10-23 DIAGNOSIS — D509 Iron deficiency anemia, unspecified: Secondary | ICD-10-CM | POA: Diagnosis not present

## 2013-10-23 DIAGNOSIS — D631 Anemia in chronic kidney disease: Secondary | ICD-10-CM | POA: Diagnosis not present

## 2013-10-23 DIAGNOSIS — N2581 Secondary hyperparathyroidism of renal origin: Secondary | ICD-10-CM | POA: Diagnosis not present

## 2013-10-23 DIAGNOSIS — Z79899 Other long term (current) drug therapy: Secondary | ICD-10-CM | POA: Diagnosis not present

## 2013-10-24 DIAGNOSIS — D631 Anemia in chronic kidney disease: Secondary | ICD-10-CM | POA: Diagnosis not present

## 2013-10-24 DIAGNOSIS — Z79899 Other long term (current) drug therapy: Secondary | ICD-10-CM | POA: Diagnosis not present

## 2013-10-24 DIAGNOSIS — N2581 Secondary hyperparathyroidism of renal origin: Secondary | ICD-10-CM | POA: Diagnosis not present

## 2013-10-24 DIAGNOSIS — D509 Iron deficiency anemia, unspecified: Secondary | ICD-10-CM | POA: Diagnosis not present

## 2013-10-24 DIAGNOSIS — N186 End stage renal disease: Secondary | ICD-10-CM | POA: Diagnosis not present

## 2013-10-25 DIAGNOSIS — Z79899 Other long term (current) drug therapy: Secondary | ICD-10-CM | POA: Diagnosis not present

## 2013-10-25 DIAGNOSIS — N2581 Secondary hyperparathyroidism of renal origin: Secondary | ICD-10-CM | POA: Diagnosis not present

## 2013-10-25 DIAGNOSIS — N039 Chronic nephritic syndrome with unspecified morphologic changes: Secondary | ICD-10-CM | POA: Diagnosis not present

## 2013-10-25 DIAGNOSIS — D631 Anemia in chronic kidney disease: Secondary | ICD-10-CM | POA: Diagnosis not present

## 2013-10-25 DIAGNOSIS — D509 Iron deficiency anemia, unspecified: Secondary | ICD-10-CM | POA: Diagnosis not present

## 2013-10-25 DIAGNOSIS — N186 End stage renal disease: Secondary | ICD-10-CM | POA: Diagnosis not present

## 2013-10-26 DIAGNOSIS — N186 End stage renal disease: Secondary | ICD-10-CM | POA: Diagnosis not present

## 2013-10-26 DIAGNOSIS — N2581 Secondary hyperparathyroidism of renal origin: Secondary | ICD-10-CM | POA: Diagnosis not present

## 2013-10-26 DIAGNOSIS — D631 Anemia in chronic kidney disease: Secondary | ICD-10-CM | POA: Diagnosis not present

## 2013-10-26 DIAGNOSIS — D509 Iron deficiency anemia, unspecified: Secondary | ICD-10-CM | POA: Diagnosis not present

## 2013-10-26 DIAGNOSIS — Z79899 Other long term (current) drug therapy: Secondary | ICD-10-CM | POA: Diagnosis not present

## 2013-10-27 DIAGNOSIS — N039 Chronic nephritic syndrome with unspecified morphologic changes: Secondary | ICD-10-CM | POA: Diagnosis not present

## 2013-10-27 DIAGNOSIS — Z79899 Other long term (current) drug therapy: Secondary | ICD-10-CM | POA: Diagnosis not present

## 2013-10-27 DIAGNOSIS — D509 Iron deficiency anemia, unspecified: Secondary | ICD-10-CM | POA: Diagnosis not present

## 2013-10-27 DIAGNOSIS — N2581 Secondary hyperparathyroidism of renal origin: Secondary | ICD-10-CM | POA: Diagnosis not present

## 2013-10-27 DIAGNOSIS — D631 Anemia in chronic kidney disease: Secondary | ICD-10-CM | POA: Diagnosis not present

## 2013-10-27 DIAGNOSIS — N186 End stage renal disease: Secondary | ICD-10-CM | POA: Diagnosis not present

## 2013-10-28 DIAGNOSIS — D509 Iron deficiency anemia, unspecified: Secondary | ICD-10-CM | POA: Diagnosis not present

## 2013-10-28 DIAGNOSIS — D631 Anemia in chronic kidney disease: Secondary | ICD-10-CM | POA: Diagnosis not present

## 2013-10-28 DIAGNOSIS — Z79899 Other long term (current) drug therapy: Secondary | ICD-10-CM | POA: Diagnosis not present

## 2013-10-28 DIAGNOSIS — N186 End stage renal disease: Secondary | ICD-10-CM | POA: Diagnosis not present

## 2013-10-28 DIAGNOSIS — N2581 Secondary hyperparathyroidism of renal origin: Secondary | ICD-10-CM | POA: Diagnosis not present

## 2013-10-28 DIAGNOSIS — N039 Chronic nephritic syndrome with unspecified morphologic changes: Secondary | ICD-10-CM | POA: Diagnosis not present

## 2013-10-29 DIAGNOSIS — N039 Chronic nephritic syndrome with unspecified morphologic changes: Secondary | ICD-10-CM | POA: Diagnosis not present

## 2013-10-29 DIAGNOSIS — N186 End stage renal disease: Secondary | ICD-10-CM | POA: Diagnosis not present

## 2013-10-29 DIAGNOSIS — D509 Iron deficiency anemia, unspecified: Secondary | ICD-10-CM | POA: Diagnosis not present

## 2013-10-29 DIAGNOSIS — N2581 Secondary hyperparathyroidism of renal origin: Secondary | ICD-10-CM | POA: Diagnosis not present

## 2013-10-29 DIAGNOSIS — Z79899 Other long term (current) drug therapy: Secondary | ICD-10-CM | POA: Diagnosis not present

## 2013-10-29 DIAGNOSIS — Z23 Encounter for immunization: Secondary | ICD-10-CM | POA: Diagnosis not present

## 2013-10-29 DIAGNOSIS — D631 Anemia in chronic kidney disease: Secondary | ICD-10-CM | POA: Diagnosis not present

## 2013-10-30 DIAGNOSIS — N2581 Secondary hyperparathyroidism of renal origin: Secondary | ICD-10-CM | POA: Diagnosis not present

## 2013-10-30 DIAGNOSIS — D631 Anemia in chronic kidney disease: Secondary | ICD-10-CM | POA: Diagnosis not present

## 2013-10-30 DIAGNOSIS — Z23 Encounter for immunization: Secondary | ICD-10-CM | POA: Diagnosis not present

## 2013-10-30 DIAGNOSIS — N186 End stage renal disease: Secondary | ICD-10-CM | POA: Diagnosis not present

## 2013-10-30 DIAGNOSIS — Z79899 Other long term (current) drug therapy: Secondary | ICD-10-CM | POA: Diagnosis not present

## 2013-10-30 DIAGNOSIS — D509 Iron deficiency anemia, unspecified: Secondary | ICD-10-CM | POA: Diagnosis not present

## 2013-10-31 DIAGNOSIS — N2581 Secondary hyperparathyroidism of renal origin: Secondary | ICD-10-CM | POA: Diagnosis not present

## 2013-10-31 DIAGNOSIS — Z79899 Other long term (current) drug therapy: Secondary | ICD-10-CM | POA: Diagnosis not present

## 2013-10-31 DIAGNOSIS — D509 Iron deficiency anemia, unspecified: Secondary | ICD-10-CM | POA: Diagnosis not present

## 2013-10-31 DIAGNOSIS — N186 End stage renal disease: Secondary | ICD-10-CM | POA: Diagnosis not present

## 2013-10-31 DIAGNOSIS — Z23 Encounter for immunization: Secondary | ICD-10-CM | POA: Diagnosis not present

## 2013-10-31 DIAGNOSIS — D631 Anemia in chronic kidney disease: Secondary | ICD-10-CM | POA: Diagnosis not present

## 2013-11-01 DIAGNOSIS — Z79899 Other long term (current) drug therapy: Secondary | ICD-10-CM | POA: Diagnosis not present

## 2013-11-01 DIAGNOSIS — D631 Anemia in chronic kidney disease: Secondary | ICD-10-CM | POA: Diagnosis not present

## 2013-11-01 DIAGNOSIS — N186 End stage renal disease: Secondary | ICD-10-CM | POA: Diagnosis not present

## 2013-11-01 DIAGNOSIS — Z23 Encounter for immunization: Secondary | ICD-10-CM | POA: Diagnosis not present

## 2013-11-01 DIAGNOSIS — D509 Iron deficiency anemia, unspecified: Secondary | ICD-10-CM | POA: Diagnosis not present

## 2013-11-01 DIAGNOSIS — N2581 Secondary hyperparathyroidism of renal origin: Secondary | ICD-10-CM | POA: Diagnosis not present

## 2013-11-02 DIAGNOSIS — Z79899 Other long term (current) drug therapy: Secondary | ICD-10-CM | POA: Diagnosis not present

## 2013-11-02 DIAGNOSIS — D631 Anemia in chronic kidney disease: Secondary | ICD-10-CM | POA: Diagnosis not present

## 2013-11-02 DIAGNOSIS — Z23 Encounter for immunization: Secondary | ICD-10-CM | POA: Diagnosis not present

## 2013-11-02 DIAGNOSIS — D509 Iron deficiency anemia, unspecified: Secondary | ICD-10-CM | POA: Diagnosis not present

## 2013-11-02 DIAGNOSIS — N2581 Secondary hyperparathyroidism of renal origin: Secondary | ICD-10-CM | POA: Diagnosis not present

## 2013-11-02 DIAGNOSIS — N186 End stage renal disease: Secondary | ICD-10-CM | POA: Diagnosis not present

## 2013-11-03 DIAGNOSIS — N186 End stage renal disease: Secondary | ICD-10-CM | POA: Diagnosis not present

## 2013-11-03 DIAGNOSIS — Z79899 Other long term (current) drug therapy: Secondary | ICD-10-CM | POA: Diagnosis not present

## 2013-11-03 DIAGNOSIS — N2581 Secondary hyperparathyroidism of renal origin: Secondary | ICD-10-CM | POA: Diagnosis not present

## 2013-11-03 DIAGNOSIS — Z23 Encounter for immunization: Secondary | ICD-10-CM | POA: Diagnosis not present

## 2013-11-03 DIAGNOSIS — D509 Iron deficiency anemia, unspecified: Secondary | ICD-10-CM | POA: Diagnosis not present

## 2013-11-03 DIAGNOSIS — D631 Anemia in chronic kidney disease: Secondary | ICD-10-CM | POA: Diagnosis not present

## 2013-11-04 DIAGNOSIS — Z23 Encounter for immunization: Secondary | ICD-10-CM | POA: Diagnosis not present

## 2013-11-04 DIAGNOSIS — D631 Anemia in chronic kidney disease: Secondary | ICD-10-CM | POA: Diagnosis not present

## 2013-11-04 DIAGNOSIS — N2581 Secondary hyperparathyroidism of renal origin: Secondary | ICD-10-CM | POA: Diagnosis not present

## 2013-11-04 DIAGNOSIS — N039 Chronic nephritic syndrome with unspecified morphologic changes: Secondary | ICD-10-CM | POA: Diagnosis not present

## 2013-11-04 DIAGNOSIS — D509 Iron deficiency anemia, unspecified: Secondary | ICD-10-CM | POA: Diagnosis not present

## 2013-11-04 DIAGNOSIS — N186 End stage renal disease: Secondary | ICD-10-CM | POA: Diagnosis not present

## 2013-11-04 DIAGNOSIS — Z79899 Other long term (current) drug therapy: Secondary | ICD-10-CM | POA: Diagnosis not present

## 2013-11-05 DIAGNOSIS — D631 Anemia in chronic kidney disease: Secondary | ICD-10-CM | POA: Diagnosis not present

## 2013-11-05 DIAGNOSIS — N186 End stage renal disease: Secondary | ICD-10-CM | POA: Diagnosis not present

## 2013-11-05 DIAGNOSIS — Z23 Encounter for immunization: Secondary | ICD-10-CM | POA: Diagnosis not present

## 2013-11-05 DIAGNOSIS — Z79899 Other long term (current) drug therapy: Secondary | ICD-10-CM | POA: Diagnosis not present

## 2013-11-05 DIAGNOSIS — D509 Iron deficiency anemia, unspecified: Secondary | ICD-10-CM | POA: Diagnosis not present

## 2013-11-05 DIAGNOSIS — N2581 Secondary hyperparathyroidism of renal origin: Secondary | ICD-10-CM | POA: Diagnosis not present

## 2013-11-05 DIAGNOSIS — N039 Chronic nephritic syndrome with unspecified morphologic changes: Secondary | ICD-10-CM | POA: Diagnosis not present

## 2013-11-06 DIAGNOSIS — D509 Iron deficiency anemia, unspecified: Secondary | ICD-10-CM | POA: Diagnosis not present

## 2013-11-06 DIAGNOSIS — D631 Anemia in chronic kidney disease: Secondary | ICD-10-CM | POA: Diagnosis not present

## 2013-11-06 DIAGNOSIS — Z23 Encounter for immunization: Secondary | ICD-10-CM | POA: Diagnosis not present

## 2013-11-06 DIAGNOSIS — N2581 Secondary hyperparathyroidism of renal origin: Secondary | ICD-10-CM | POA: Diagnosis not present

## 2013-11-06 DIAGNOSIS — N186 End stage renal disease: Secondary | ICD-10-CM | POA: Diagnosis not present

## 2013-11-06 DIAGNOSIS — Z79899 Other long term (current) drug therapy: Secondary | ICD-10-CM | POA: Diagnosis not present

## 2013-11-07 DIAGNOSIS — Z23 Encounter for immunization: Secondary | ICD-10-CM | POA: Diagnosis not present

## 2013-11-07 DIAGNOSIS — D509 Iron deficiency anemia, unspecified: Secondary | ICD-10-CM | POA: Diagnosis not present

## 2013-11-07 DIAGNOSIS — N186 End stage renal disease: Secondary | ICD-10-CM | POA: Diagnosis not present

## 2013-11-07 DIAGNOSIS — D631 Anemia in chronic kidney disease: Secondary | ICD-10-CM | POA: Diagnosis not present

## 2013-11-07 DIAGNOSIS — Z79899 Other long term (current) drug therapy: Secondary | ICD-10-CM | POA: Diagnosis not present

## 2013-11-07 DIAGNOSIS — N2581 Secondary hyperparathyroidism of renal origin: Secondary | ICD-10-CM | POA: Diagnosis not present

## 2013-11-08 DIAGNOSIS — N2581 Secondary hyperparathyroidism of renal origin: Secondary | ICD-10-CM | POA: Diagnosis not present

## 2013-11-08 DIAGNOSIS — D631 Anemia in chronic kidney disease: Secondary | ICD-10-CM | POA: Diagnosis not present

## 2013-11-08 DIAGNOSIS — N186 End stage renal disease: Secondary | ICD-10-CM | POA: Diagnosis not present

## 2013-11-08 DIAGNOSIS — D509 Iron deficiency anemia, unspecified: Secondary | ICD-10-CM | POA: Diagnosis not present

## 2013-11-08 DIAGNOSIS — Z79899 Other long term (current) drug therapy: Secondary | ICD-10-CM | POA: Diagnosis not present

## 2013-11-08 DIAGNOSIS — Z23 Encounter for immunization: Secondary | ICD-10-CM | POA: Diagnosis not present

## 2013-11-09 DIAGNOSIS — Z23 Encounter for immunization: Secondary | ICD-10-CM | POA: Diagnosis not present

## 2013-11-09 DIAGNOSIS — Z79899 Other long term (current) drug therapy: Secondary | ICD-10-CM | POA: Diagnosis not present

## 2013-11-09 DIAGNOSIS — D509 Iron deficiency anemia, unspecified: Secondary | ICD-10-CM | POA: Diagnosis not present

## 2013-11-09 DIAGNOSIS — N2581 Secondary hyperparathyroidism of renal origin: Secondary | ICD-10-CM | POA: Diagnosis not present

## 2013-11-09 DIAGNOSIS — D631 Anemia in chronic kidney disease: Secondary | ICD-10-CM | POA: Diagnosis not present

## 2013-11-09 DIAGNOSIS — N186 End stage renal disease: Secondary | ICD-10-CM | POA: Diagnosis not present

## 2013-11-10 DIAGNOSIS — N186 End stage renal disease: Secondary | ICD-10-CM | POA: Diagnosis not present

## 2013-11-10 DIAGNOSIS — N039 Chronic nephritic syndrome with unspecified morphologic changes: Secondary | ICD-10-CM | POA: Diagnosis not present

## 2013-11-10 DIAGNOSIS — N2581 Secondary hyperparathyroidism of renal origin: Secondary | ICD-10-CM | POA: Diagnosis not present

## 2013-11-10 DIAGNOSIS — D509 Iron deficiency anemia, unspecified: Secondary | ICD-10-CM | POA: Diagnosis not present

## 2013-11-10 DIAGNOSIS — Z79899 Other long term (current) drug therapy: Secondary | ICD-10-CM | POA: Diagnosis not present

## 2013-11-10 DIAGNOSIS — Z23 Encounter for immunization: Secondary | ICD-10-CM | POA: Diagnosis not present

## 2013-11-10 DIAGNOSIS — D631 Anemia in chronic kidney disease: Secondary | ICD-10-CM | POA: Diagnosis not present

## 2013-11-11 DIAGNOSIS — D509 Iron deficiency anemia, unspecified: Secondary | ICD-10-CM | POA: Diagnosis not present

## 2013-11-11 DIAGNOSIS — D631 Anemia in chronic kidney disease: Secondary | ICD-10-CM | POA: Diagnosis not present

## 2013-11-11 DIAGNOSIS — N2581 Secondary hyperparathyroidism of renal origin: Secondary | ICD-10-CM | POA: Diagnosis not present

## 2013-11-11 DIAGNOSIS — Z79899 Other long term (current) drug therapy: Secondary | ICD-10-CM | POA: Diagnosis not present

## 2013-11-11 DIAGNOSIS — Z23 Encounter for immunization: Secondary | ICD-10-CM | POA: Diagnosis not present

## 2013-11-11 DIAGNOSIS — N186 End stage renal disease: Secondary | ICD-10-CM | POA: Diagnosis not present

## 2013-11-12 DIAGNOSIS — Z23 Encounter for immunization: Secondary | ICD-10-CM | POA: Diagnosis not present

## 2013-11-12 DIAGNOSIS — D509 Iron deficiency anemia, unspecified: Secondary | ICD-10-CM | POA: Diagnosis not present

## 2013-11-12 DIAGNOSIS — N186 End stage renal disease: Secondary | ICD-10-CM | POA: Diagnosis not present

## 2013-11-12 DIAGNOSIS — D631 Anemia in chronic kidney disease: Secondary | ICD-10-CM | POA: Diagnosis not present

## 2013-11-12 DIAGNOSIS — N2581 Secondary hyperparathyroidism of renal origin: Secondary | ICD-10-CM | POA: Diagnosis not present

## 2013-11-12 DIAGNOSIS — Z79899 Other long term (current) drug therapy: Secondary | ICD-10-CM | POA: Diagnosis not present

## 2013-11-13 DIAGNOSIS — N186 End stage renal disease: Secondary | ICD-10-CM | POA: Diagnosis not present

## 2013-11-13 DIAGNOSIS — D509 Iron deficiency anemia, unspecified: Secondary | ICD-10-CM | POA: Diagnosis not present

## 2013-11-13 DIAGNOSIS — D631 Anemia in chronic kidney disease: Secondary | ICD-10-CM | POA: Diagnosis not present

## 2013-11-13 DIAGNOSIS — Z79899 Other long term (current) drug therapy: Secondary | ICD-10-CM | POA: Diagnosis not present

## 2013-11-13 DIAGNOSIS — N2581 Secondary hyperparathyroidism of renal origin: Secondary | ICD-10-CM | POA: Diagnosis not present

## 2013-11-13 DIAGNOSIS — Z23 Encounter for immunization: Secondary | ICD-10-CM | POA: Diagnosis not present

## 2013-11-14 DIAGNOSIS — N186 End stage renal disease: Secondary | ICD-10-CM | POA: Diagnosis not present

## 2013-11-14 DIAGNOSIS — D631 Anemia in chronic kidney disease: Secondary | ICD-10-CM | POA: Diagnosis not present

## 2013-11-14 DIAGNOSIS — Z79899 Other long term (current) drug therapy: Secondary | ICD-10-CM | POA: Diagnosis not present

## 2013-11-14 DIAGNOSIS — N2581 Secondary hyperparathyroidism of renal origin: Secondary | ICD-10-CM | POA: Diagnosis not present

## 2013-11-14 DIAGNOSIS — N039 Chronic nephritic syndrome with unspecified morphologic changes: Secondary | ICD-10-CM | POA: Diagnosis not present

## 2013-11-14 DIAGNOSIS — D509 Iron deficiency anemia, unspecified: Secondary | ICD-10-CM | POA: Diagnosis not present

## 2013-11-14 DIAGNOSIS — Z23 Encounter for immunization: Secondary | ICD-10-CM | POA: Diagnosis not present

## 2013-11-15 DIAGNOSIS — D631 Anemia in chronic kidney disease: Secondary | ICD-10-CM | POA: Diagnosis not present

## 2013-11-15 DIAGNOSIS — Z79899 Other long term (current) drug therapy: Secondary | ICD-10-CM | POA: Diagnosis not present

## 2013-11-15 DIAGNOSIS — N186 End stage renal disease: Secondary | ICD-10-CM | POA: Diagnosis not present

## 2013-11-15 DIAGNOSIS — D509 Iron deficiency anemia, unspecified: Secondary | ICD-10-CM | POA: Diagnosis not present

## 2013-11-15 DIAGNOSIS — N039 Chronic nephritic syndrome with unspecified morphologic changes: Secondary | ICD-10-CM | POA: Diagnosis not present

## 2013-11-15 DIAGNOSIS — Z23 Encounter for immunization: Secondary | ICD-10-CM | POA: Diagnosis not present

## 2013-11-15 DIAGNOSIS — N2581 Secondary hyperparathyroidism of renal origin: Secondary | ICD-10-CM | POA: Diagnosis not present

## 2013-11-16 DIAGNOSIS — N039 Chronic nephritic syndrome with unspecified morphologic changes: Secondary | ICD-10-CM | POA: Diagnosis not present

## 2013-11-16 DIAGNOSIS — D509 Iron deficiency anemia, unspecified: Secondary | ICD-10-CM | POA: Diagnosis not present

## 2013-11-16 DIAGNOSIS — N2581 Secondary hyperparathyroidism of renal origin: Secondary | ICD-10-CM | POA: Diagnosis not present

## 2013-11-16 DIAGNOSIS — Z79899 Other long term (current) drug therapy: Secondary | ICD-10-CM | POA: Diagnosis not present

## 2013-11-16 DIAGNOSIS — N186 End stage renal disease: Secondary | ICD-10-CM | POA: Diagnosis not present

## 2013-11-16 DIAGNOSIS — D631 Anemia in chronic kidney disease: Secondary | ICD-10-CM | POA: Diagnosis not present

## 2013-11-16 DIAGNOSIS — Z23 Encounter for immunization: Secondary | ICD-10-CM | POA: Diagnosis not present

## 2013-11-17 DIAGNOSIS — D631 Anemia in chronic kidney disease: Secondary | ICD-10-CM | POA: Diagnosis not present

## 2013-11-17 DIAGNOSIS — D509 Iron deficiency anemia, unspecified: Secondary | ICD-10-CM | POA: Diagnosis not present

## 2013-11-17 DIAGNOSIS — Z79899 Other long term (current) drug therapy: Secondary | ICD-10-CM | POA: Diagnosis not present

## 2013-11-17 DIAGNOSIS — N2581 Secondary hyperparathyroidism of renal origin: Secondary | ICD-10-CM | POA: Diagnosis not present

## 2013-11-17 DIAGNOSIS — N186 End stage renal disease: Secondary | ICD-10-CM | POA: Diagnosis not present

## 2013-11-17 DIAGNOSIS — Z23 Encounter for immunization: Secondary | ICD-10-CM | POA: Diagnosis not present

## 2013-11-18 DIAGNOSIS — D631 Anemia in chronic kidney disease: Secondary | ICD-10-CM | POA: Diagnosis not present

## 2013-11-18 DIAGNOSIS — N186 End stage renal disease: Secondary | ICD-10-CM | POA: Diagnosis not present

## 2013-11-18 DIAGNOSIS — Z79899 Other long term (current) drug therapy: Secondary | ICD-10-CM | POA: Diagnosis not present

## 2013-11-18 DIAGNOSIS — N2581 Secondary hyperparathyroidism of renal origin: Secondary | ICD-10-CM | POA: Diagnosis not present

## 2013-11-18 DIAGNOSIS — N039 Chronic nephritic syndrome with unspecified morphologic changes: Secondary | ICD-10-CM | POA: Diagnosis not present

## 2013-11-18 DIAGNOSIS — Z23 Encounter for immunization: Secondary | ICD-10-CM | POA: Diagnosis not present

## 2013-11-18 DIAGNOSIS — D509 Iron deficiency anemia, unspecified: Secondary | ICD-10-CM | POA: Diagnosis not present

## 2013-11-19 DIAGNOSIS — D509 Iron deficiency anemia, unspecified: Secondary | ICD-10-CM | POA: Diagnosis not present

## 2013-11-19 DIAGNOSIS — Z79899 Other long term (current) drug therapy: Secondary | ICD-10-CM | POA: Diagnosis not present

## 2013-11-19 DIAGNOSIS — N186 End stage renal disease: Secondary | ICD-10-CM | POA: Diagnosis not present

## 2013-11-19 DIAGNOSIS — D631 Anemia in chronic kidney disease: Secondary | ICD-10-CM | POA: Diagnosis not present

## 2013-11-19 DIAGNOSIS — N039 Chronic nephritic syndrome with unspecified morphologic changes: Secondary | ICD-10-CM | POA: Diagnosis not present

## 2013-11-19 DIAGNOSIS — N2581 Secondary hyperparathyroidism of renal origin: Secondary | ICD-10-CM | POA: Diagnosis not present

## 2013-11-19 DIAGNOSIS — Z23 Encounter for immunization: Secondary | ICD-10-CM | POA: Diagnosis not present

## 2013-11-20 DIAGNOSIS — Z23 Encounter for immunization: Secondary | ICD-10-CM | POA: Diagnosis not present

## 2013-11-20 DIAGNOSIS — N186 End stage renal disease: Secondary | ICD-10-CM | POA: Diagnosis not present

## 2013-11-20 DIAGNOSIS — D631 Anemia in chronic kidney disease: Secondary | ICD-10-CM | POA: Diagnosis not present

## 2013-11-20 DIAGNOSIS — N2581 Secondary hyperparathyroidism of renal origin: Secondary | ICD-10-CM | POA: Diagnosis not present

## 2013-11-20 DIAGNOSIS — D509 Iron deficiency anemia, unspecified: Secondary | ICD-10-CM | POA: Diagnosis not present

## 2013-11-20 DIAGNOSIS — Z79899 Other long term (current) drug therapy: Secondary | ICD-10-CM | POA: Diagnosis not present

## 2013-11-21 DIAGNOSIS — N2581 Secondary hyperparathyroidism of renal origin: Secondary | ICD-10-CM | POA: Diagnosis not present

## 2013-11-21 DIAGNOSIS — N186 End stage renal disease: Secondary | ICD-10-CM | POA: Diagnosis not present

## 2013-11-21 DIAGNOSIS — N039 Chronic nephritic syndrome with unspecified morphologic changes: Secondary | ICD-10-CM | POA: Diagnosis not present

## 2013-11-21 DIAGNOSIS — D631 Anemia in chronic kidney disease: Secondary | ICD-10-CM | POA: Diagnosis not present

## 2013-11-21 DIAGNOSIS — Z23 Encounter for immunization: Secondary | ICD-10-CM | POA: Diagnosis not present

## 2013-11-21 DIAGNOSIS — D509 Iron deficiency anemia, unspecified: Secondary | ICD-10-CM | POA: Diagnosis not present

## 2013-11-21 DIAGNOSIS — Z79899 Other long term (current) drug therapy: Secondary | ICD-10-CM | POA: Diagnosis not present

## 2013-11-22 DIAGNOSIS — N2581 Secondary hyperparathyroidism of renal origin: Secondary | ICD-10-CM | POA: Diagnosis not present

## 2013-11-22 DIAGNOSIS — D631 Anemia in chronic kidney disease: Secondary | ICD-10-CM | POA: Diagnosis not present

## 2013-11-22 DIAGNOSIS — N186 End stage renal disease: Secondary | ICD-10-CM | POA: Diagnosis not present

## 2013-11-22 DIAGNOSIS — Z23 Encounter for immunization: Secondary | ICD-10-CM | POA: Diagnosis not present

## 2013-11-22 DIAGNOSIS — N039 Chronic nephritic syndrome with unspecified morphologic changes: Secondary | ICD-10-CM | POA: Diagnosis not present

## 2013-11-22 DIAGNOSIS — Z79899 Other long term (current) drug therapy: Secondary | ICD-10-CM | POA: Diagnosis not present

## 2013-11-22 DIAGNOSIS — D509 Iron deficiency anemia, unspecified: Secondary | ICD-10-CM | POA: Diagnosis not present

## 2013-11-23 DIAGNOSIS — D509 Iron deficiency anemia, unspecified: Secondary | ICD-10-CM | POA: Diagnosis not present

## 2013-11-23 DIAGNOSIS — D631 Anemia in chronic kidney disease: Secondary | ICD-10-CM | POA: Diagnosis not present

## 2013-11-23 DIAGNOSIS — Z23 Encounter for immunization: Secondary | ICD-10-CM | POA: Diagnosis not present

## 2013-11-23 DIAGNOSIS — Z79899 Other long term (current) drug therapy: Secondary | ICD-10-CM | POA: Diagnosis not present

## 2013-11-23 DIAGNOSIS — N186 End stage renal disease: Secondary | ICD-10-CM | POA: Diagnosis not present

## 2013-11-23 DIAGNOSIS — N2581 Secondary hyperparathyroidism of renal origin: Secondary | ICD-10-CM | POA: Diagnosis not present

## 2013-11-24 DIAGNOSIS — Z23 Encounter for immunization: Secondary | ICD-10-CM | POA: Diagnosis not present

## 2013-11-24 DIAGNOSIS — N186 End stage renal disease: Secondary | ICD-10-CM | POA: Diagnosis not present

## 2013-11-24 DIAGNOSIS — Z79899 Other long term (current) drug therapy: Secondary | ICD-10-CM | POA: Diagnosis not present

## 2013-11-24 DIAGNOSIS — D631 Anemia in chronic kidney disease: Secondary | ICD-10-CM | POA: Diagnosis not present

## 2013-11-24 DIAGNOSIS — D509 Iron deficiency anemia, unspecified: Secondary | ICD-10-CM | POA: Diagnosis not present

## 2013-11-24 DIAGNOSIS — N2581 Secondary hyperparathyroidism of renal origin: Secondary | ICD-10-CM | POA: Diagnosis not present

## 2013-11-25 DIAGNOSIS — Z79899 Other long term (current) drug therapy: Secondary | ICD-10-CM | POA: Diagnosis not present

## 2013-11-25 DIAGNOSIS — N2581 Secondary hyperparathyroidism of renal origin: Secondary | ICD-10-CM | POA: Diagnosis not present

## 2013-11-25 DIAGNOSIS — N039 Chronic nephritic syndrome with unspecified morphologic changes: Secondary | ICD-10-CM | POA: Diagnosis not present

## 2013-11-25 DIAGNOSIS — D509 Iron deficiency anemia, unspecified: Secondary | ICD-10-CM | POA: Diagnosis not present

## 2013-11-25 DIAGNOSIS — D631 Anemia in chronic kidney disease: Secondary | ICD-10-CM | POA: Diagnosis not present

## 2013-11-25 DIAGNOSIS — Z23 Encounter for immunization: Secondary | ICD-10-CM | POA: Diagnosis not present

## 2013-11-25 DIAGNOSIS — N186 End stage renal disease: Secondary | ICD-10-CM | POA: Diagnosis not present

## 2013-11-26 DIAGNOSIS — Z79899 Other long term (current) drug therapy: Secondary | ICD-10-CM | POA: Diagnosis not present

## 2013-11-26 DIAGNOSIS — D509 Iron deficiency anemia, unspecified: Secondary | ICD-10-CM | POA: Diagnosis not present

## 2013-11-26 DIAGNOSIS — N2581 Secondary hyperparathyroidism of renal origin: Secondary | ICD-10-CM | POA: Diagnosis not present

## 2013-11-26 DIAGNOSIS — D631 Anemia in chronic kidney disease: Secondary | ICD-10-CM | POA: Diagnosis not present

## 2013-11-26 DIAGNOSIS — N186 End stage renal disease: Secondary | ICD-10-CM | POA: Diagnosis not present

## 2013-11-26 DIAGNOSIS — Z23 Encounter for immunization: Secondary | ICD-10-CM | POA: Diagnosis not present

## 2013-11-27 DIAGNOSIS — D631 Anemia in chronic kidney disease: Secondary | ICD-10-CM | POA: Diagnosis not present

## 2013-11-27 DIAGNOSIS — Z79899 Other long term (current) drug therapy: Secondary | ICD-10-CM | POA: Diagnosis not present

## 2013-11-27 DIAGNOSIS — Z23 Encounter for immunization: Secondary | ICD-10-CM | POA: Diagnosis not present

## 2013-11-27 DIAGNOSIS — D509 Iron deficiency anemia, unspecified: Secondary | ICD-10-CM | POA: Diagnosis not present

## 2013-11-27 DIAGNOSIS — N2581 Secondary hyperparathyroidism of renal origin: Secondary | ICD-10-CM | POA: Diagnosis not present

## 2013-11-27 DIAGNOSIS — N186 End stage renal disease: Secondary | ICD-10-CM | POA: Diagnosis not present

## 2013-11-28 DIAGNOSIS — D509 Iron deficiency anemia, unspecified: Secondary | ICD-10-CM | POA: Diagnosis not present

## 2013-11-28 DIAGNOSIS — D631 Anemia in chronic kidney disease: Secondary | ICD-10-CM | POA: Diagnosis not present

## 2013-11-28 DIAGNOSIS — N186 End stage renal disease: Secondary | ICD-10-CM | POA: Diagnosis not present

## 2013-11-29 DIAGNOSIS — N186 End stage renal disease: Secondary | ICD-10-CM | POA: Diagnosis not present

## 2013-12-28 DIAGNOSIS — N186 End stage renal disease: Secondary | ICD-10-CM | POA: Diagnosis not present

## 2013-12-28 DIAGNOSIS — Z992 Dependence on renal dialysis: Secondary | ICD-10-CM | POA: Diagnosis not present

## 2013-12-29 DIAGNOSIS — N2581 Secondary hyperparathyroidism of renal origin: Secondary | ICD-10-CM | POA: Diagnosis not present

## 2013-12-29 DIAGNOSIS — D631 Anemia in chronic kidney disease: Secondary | ICD-10-CM | POA: Diagnosis not present

## 2013-12-29 DIAGNOSIS — D509 Iron deficiency anemia, unspecified: Secondary | ICD-10-CM | POA: Diagnosis not present

## 2013-12-29 DIAGNOSIS — N186 End stage renal disease: Secondary | ICD-10-CM | POA: Diagnosis not present

## 2013-12-29 DIAGNOSIS — Z79899 Other long term (current) drug therapy: Secondary | ICD-10-CM | POA: Diagnosis not present

## 2013-12-30 DIAGNOSIS — Z79899 Other long term (current) drug therapy: Secondary | ICD-10-CM | POA: Diagnosis not present

## 2013-12-30 DIAGNOSIS — D631 Anemia in chronic kidney disease: Secondary | ICD-10-CM | POA: Diagnosis not present

## 2013-12-30 DIAGNOSIS — N2581 Secondary hyperparathyroidism of renal origin: Secondary | ICD-10-CM | POA: Diagnosis not present

## 2013-12-30 DIAGNOSIS — D509 Iron deficiency anemia, unspecified: Secondary | ICD-10-CM | POA: Diagnosis not present

## 2013-12-30 DIAGNOSIS — N186 End stage renal disease: Secondary | ICD-10-CM | POA: Diagnosis not present

## 2013-12-31 DIAGNOSIS — Z79899 Other long term (current) drug therapy: Secondary | ICD-10-CM | POA: Diagnosis not present

## 2013-12-31 DIAGNOSIS — N186 End stage renal disease: Secondary | ICD-10-CM | POA: Diagnosis not present

## 2013-12-31 DIAGNOSIS — D509 Iron deficiency anemia, unspecified: Secondary | ICD-10-CM | POA: Diagnosis not present

## 2013-12-31 DIAGNOSIS — D631 Anemia in chronic kidney disease: Secondary | ICD-10-CM | POA: Diagnosis not present

## 2013-12-31 DIAGNOSIS — N2581 Secondary hyperparathyroidism of renal origin: Secondary | ICD-10-CM | POA: Diagnosis not present

## 2014-01-01 DIAGNOSIS — N186 End stage renal disease: Secondary | ICD-10-CM | POA: Diagnosis not present

## 2014-01-01 DIAGNOSIS — D509 Iron deficiency anemia, unspecified: Secondary | ICD-10-CM | POA: Diagnosis not present

## 2014-01-01 DIAGNOSIS — N2581 Secondary hyperparathyroidism of renal origin: Secondary | ICD-10-CM | POA: Diagnosis not present

## 2014-01-01 DIAGNOSIS — Z79899 Other long term (current) drug therapy: Secondary | ICD-10-CM | POA: Diagnosis not present

## 2014-01-01 DIAGNOSIS — D631 Anemia in chronic kidney disease: Secondary | ICD-10-CM | POA: Diagnosis not present

## 2014-01-02 DIAGNOSIS — D509 Iron deficiency anemia, unspecified: Secondary | ICD-10-CM | POA: Diagnosis not present

## 2014-01-02 DIAGNOSIS — Z79899 Other long term (current) drug therapy: Secondary | ICD-10-CM | POA: Diagnosis not present

## 2014-01-02 DIAGNOSIS — D631 Anemia in chronic kidney disease: Secondary | ICD-10-CM | POA: Diagnosis not present

## 2014-01-02 DIAGNOSIS — N2581 Secondary hyperparathyroidism of renal origin: Secondary | ICD-10-CM | POA: Diagnosis not present

## 2014-01-02 DIAGNOSIS — N186 End stage renal disease: Secondary | ICD-10-CM | POA: Diagnosis not present

## 2014-01-03 DIAGNOSIS — N186 End stage renal disease: Secondary | ICD-10-CM | POA: Diagnosis not present

## 2014-01-03 DIAGNOSIS — D509 Iron deficiency anemia, unspecified: Secondary | ICD-10-CM | POA: Diagnosis not present

## 2014-01-03 DIAGNOSIS — Z79899 Other long term (current) drug therapy: Secondary | ICD-10-CM | POA: Diagnosis not present

## 2014-01-03 DIAGNOSIS — N2581 Secondary hyperparathyroidism of renal origin: Secondary | ICD-10-CM | POA: Diagnosis not present

## 2014-01-03 DIAGNOSIS — D631 Anemia in chronic kidney disease: Secondary | ICD-10-CM | POA: Diagnosis not present

## 2014-01-04 DIAGNOSIS — Z79899 Other long term (current) drug therapy: Secondary | ICD-10-CM | POA: Diagnosis not present

## 2014-01-04 DIAGNOSIS — N2581 Secondary hyperparathyroidism of renal origin: Secondary | ICD-10-CM | POA: Diagnosis not present

## 2014-01-04 DIAGNOSIS — D631 Anemia in chronic kidney disease: Secondary | ICD-10-CM | POA: Diagnosis not present

## 2014-01-04 DIAGNOSIS — N186 End stage renal disease: Secondary | ICD-10-CM | POA: Diagnosis not present

## 2014-01-04 DIAGNOSIS — D509 Iron deficiency anemia, unspecified: Secondary | ICD-10-CM | POA: Diagnosis not present

## 2014-01-05 DIAGNOSIS — Z79899 Other long term (current) drug therapy: Secondary | ICD-10-CM | POA: Diagnosis not present

## 2014-01-05 DIAGNOSIS — D631 Anemia in chronic kidney disease: Secondary | ICD-10-CM | POA: Diagnosis not present

## 2014-01-05 DIAGNOSIS — N2581 Secondary hyperparathyroidism of renal origin: Secondary | ICD-10-CM | POA: Diagnosis not present

## 2014-01-05 DIAGNOSIS — D509 Iron deficiency anemia, unspecified: Secondary | ICD-10-CM | POA: Diagnosis not present

## 2014-01-05 DIAGNOSIS — N186 End stage renal disease: Secondary | ICD-10-CM | POA: Diagnosis not present

## 2014-01-06 DIAGNOSIS — Z79899 Other long term (current) drug therapy: Secondary | ICD-10-CM | POA: Diagnosis not present

## 2014-01-06 DIAGNOSIS — D631 Anemia in chronic kidney disease: Secondary | ICD-10-CM | POA: Diagnosis not present

## 2014-01-06 DIAGNOSIS — N2581 Secondary hyperparathyroidism of renal origin: Secondary | ICD-10-CM | POA: Diagnosis not present

## 2014-01-06 DIAGNOSIS — D509 Iron deficiency anemia, unspecified: Secondary | ICD-10-CM | POA: Diagnosis not present

## 2014-01-06 DIAGNOSIS — N186 End stage renal disease: Secondary | ICD-10-CM | POA: Diagnosis not present

## 2014-01-07 DIAGNOSIS — N2581 Secondary hyperparathyroidism of renal origin: Secondary | ICD-10-CM | POA: Diagnosis not present

## 2014-01-07 DIAGNOSIS — N186 End stage renal disease: Secondary | ICD-10-CM | POA: Diagnosis not present

## 2014-01-07 DIAGNOSIS — Z79899 Other long term (current) drug therapy: Secondary | ICD-10-CM | POA: Diagnosis not present

## 2014-01-07 DIAGNOSIS — D509 Iron deficiency anemia, unspecified: Secondary | ICD-10-CM | POA: Diagnosis not present

## 2014-01-07 DIAGNOSIS — D631 Anemia in chronic kidney disease: Secondary | ICD-10-CM | POA: Diagnosis not present

## 2014-01-08 DIAGNOSIS — Z79899 Other long term (current) drug therapy: Secondary | ICD-10-CM | POA: Diagnosis not present

## 2014-01-08 DIAGNOSIS — D509 Iron deficiency anemia, unspecified: Secondary | ICD-10-CM | POA: Diagnosis not present

## 2014-01-08 DIAGNOSIS — N2581 Secondary hyperparathyroidism of renal origin: Secondary | ICD-10-CM | POA: Diagnosis not present

## 2014-01-08 DIAGNOSIS — N186 End stage renal disease: Secondary | ICD-10-CM | POA: Diagnosis not present

## 2014-01-08 DIAGNOSIS — D631 Anemia in chronic kidney disease: Secondary | ICD-10-CM | POA: Diagnosis not present

## 2014-01-09 DIAGNOSIS — N2581 Secondary hyperparathyroidism of renal origin: Secondary | ICD-10-CM | POA: Diagnosis not present

## 2014-01-09 DIAGNOSIS — D509 Iron deficiency anemia, unspecified: Secondary | ICD-10-CM | POA: Diagnosis not present

## 2014-01-09 DIAGNOSIS — Z79899 Other long term (current) drug therapy: Secondary | ICD-10-CM | POA: Diagnosis not present

## 2014-01-09 DIAGNOSIS — D631 Anemia in chronic kidney disease: Secondary | ICD-10-CM | POA: Diagnosis not present

## 2014-01-09 DIAGNOSIS — N186 End stage renal disease: Secondary | ICD-10-CM | POA: Diagnosis not present

## 2014-01-10 DIAGNOSIS — D631 Anemia in chronic kidney disease: Secondary | ICD-10-CM | POA: Diagnosis not present

## 2014-01-10 DIAGNOSIS — N186 End stage renal disease: Secondary | ICD-10-CM | POA: Diagnosis not present

## 2014-01-10 DIAGNOSIS — Z79899 Other long term (current) drug therapy: Secondary | ICD-10-CM | POA: Diagnosis not present

## 2014-01-10 DIAGNOSIS — N2581 Secondary hyperparathyroidism of renal origin: Secondary | ICD-10-CM | POA: Diagnosis not present

## 2014-01-10 DIAGNOSIS — D509 Iron deficiency anemia, unspecified: Secondary | ICD-10-CM | POA: Diagnosis not present

## 2014-01-11 DIAGNOSIS — D509 Iron deficiency anemia, unspecified: Secondary | ICD-10-CM | POA: Diagnosis not present

## 2014-01-11 DIAGNOSIS — N2581 Secondary hyperparathyroidism of renal origin: Secondary | ICD-10-CM | POA: Diagnosis not present

## 2014-01-11 DIAGNOSIS — N186 End stage renal disease: Secondary | ICD-10-CM | POA: Diagnosis not present

## 2014-01-11 DIAGNOSIS — Z79899 Other long term (current) drug therapy: Secondary | ICD-10-CM | POA: Diagnosis not present

## 2014-01-11 DIAGNOSIS — D631 Anemia in chronic kidney disease: Secondary | ICD-10-CM | POA: Diagnosis not present

## 2014-01-12 DIAGNOSIS — N186 End stage renal disease: Secondary | ICD-10-CM | POA: Diagnosis not present

## 2014-01-12 DIAGNOSIS — Z79899 Other long term (current) drug therapy: Secondary | ICD-10-CM | POA: Diagnosis not present

## 2014-01-12 DIAGNOSIS — D631 Anemia in chronic kidney disease: Secondary | ICD-10-CM | POA: Diagnosis not present

## 2014-01-12 DIAGNOSIS — N2581 Secondary hyperparathyroidism of renal origin: Secondary | ICD-10-CM | POA: Diagnosis not present

## 2014-01-12 DIAGNOSIS — D509 Iron deficiency anemia, unspecified: Secondary | ICD-10-CM | POA: Diagnosis not present

## 2014-01-13 DIAGNOSIS — D509 Iron deficiency anemia, unspecified: Secondary | ICD-10-CM | POA: Diagnosis not present

## 2014-01-13 DIAGNOSIS — N2581 Secondary hyperparathyroidism of renal origin: Secondary | ICD-10-CM | POA: Diagnosis not present

## 2014-01-13 DIAGNOSIS — D631 Anemia in chronic kidney disease: Secondary | ICD-10-CM | POA: Diagnosis not present

## 2014-01-13 DIAGNOSIS — N186 End stage renal disease: Secondary | ICD-10-CM | POA: Diagnosis not present

## 2014-01-13 DIAGNOSIS — Z79899 Other long term (current) drug therapy: Secondary | ICD-10-CM | POA: Diagnosis not present

## 2014-01-14 DIAGNOSIS — N2581 Secondary hyperparathyroidism of renal origin: Secondary | ICD-10-CM | POA: Diagnosis not present

## 2014-01-14 DIAGNOSIS — D509 Iron deficiency anemia, unspecified: Secondary | ICD-10-CM | POA: Diagnosis not present

## 2014-01-14 DIAGNOSIS — N186 End stage renal disease: Secondary | ICD-10-CM | POA: Diagnosis not present

## 2014-01-14 DIAGNOSIS — Z79899 Other long term (current) drug therapy: Secondary | ICD-10-CM | POA: Diagnosis not present

## 2014-01-14 DIAGNOSIS — D631 Anemia in chronic kidney disease: Secondary | ICD-10-CM | POA: Diagnosis not present

## 2014-01-15 DIAGNOSIS — D509 Iron deficiency anemia, unspecified: Secondary | ICD-10-CM | POA: Diagnosis not present

## 2014-01-15 DIAGNOSIS — N2581 Secondary hyperparathyroidism of renal origin: Secondary | ICD-10-CM | POA: Diagnosis not present

## 2014-01-15 DIAGNOSIS — D631 Anemia in chronic kidney disease: Secondary | ICD-10-CM | POA: Diagnosis not present

## 2014-01-15 DIAGNOSIS — N186 End stage renal disease: Secondary | ICD-10-CM | POA: Diagnosis not present

## 2014-01-15 DIAGNOSIS — Z79899 Other long term (current) drug therapy: Secondary | ICD-10-CM | POA: Diagnosis not present

## 2014-01-16 DIAGNOSIS — N2581 Secondary hyperparathyroidism of renal origin: Secondary | ICD-10-CM | POA: Diagnosis not present

## 2014-01-16 DIAGNOSIS — N186 End stage renal disease: Secondary | ICD-10-CM | POA: Diagnosis not present

## 2014-01-16 DIAGNOSIS — D509 Iron deficiency anemia, unspecified: Secondary | ICD-10-CM | POA: Diagnosis not present

## 2014-01-16 DIAGNOSIS — Z79899 Other long term (current) drug therapy: Secondary | ICD-10-CM | POA: Diagnosis not present

## 2014-01-16 DIAGNOSIS — D631 Anemia in chronic kidney disease: Secondary | ICD-10-CM | POA: Diagnosis not present

## 2014-01-17 DIAGNOSIS — N2581 Secondary hyperparathyroidism of renal origin: Secondary | ICD-10-CM | POA: Diagnosis not present

## 2014-01-17 DIAGNOSIS — Z79899 Other long term (current) drug therapy: Secondary | ICD-10-CM | POA: Diagnosis not present

## 2014-01-17 DIAGNOSIS — D509 Iron deficiency anemia, unspecified: Secondary | ICD-10-CM | POA: Diagnosis not present

## 2014-01-17 DIAGNOSIS — D631 Anemia in chronic kidney disease: Secondary | ICD-10-CM | POA: Diagnosis not present

## 2014-01-17 DIAGNOSIS — N186 End stage renal disease: Secondary | ICD-10-CM | POA: Diagnosis not present

## 2014-01-18 DIAGNOSIS — D631 Anemia in chronic kidney disease: Secondary | ICD-10-CM | POA: Diagnosis not present

## 2014-01-18 DIAGNOSIS — Z79899 Other long term (current) drug therapy: Secondary | ICD-10-CM | POA: Diagnosis not present

## 2014-01-18 DIAGNOSIS — N186 End stage renal disease: Secondary | ICD-10-CM | POA: Diagnosis not present

## 2014-01-18 DIAGNOSIS — D509 Iron deficiency anemia, unspecified: Secondary | ICD-10-CM | POA: Diagnosis not present

## 2014-01-18 DIAGNOSIS — N2581 Secondary hyperparathyroidism of renal origin: Secondary | ICD-10-CM | POA: Diagnosis not present

## 2014-01-19 DIAGNOSIS — D509 Iron deficiency anemia, unspecified: Secondary | ICD-10-CM | POA: Diagnosis not present

## 2014-01-19 DIAGNOSIS — Z79899 Other long term (current) drug therapy: Secondary | ICD-10-CM | POA: Diagnosis not present

## 2014-01-19 DIAGNOSIS — N2581 Secondary hyperparathyroidism of renal origin: Secondary | ICD-10-CM | POA: Diagnosis not present

## 2014-01-19 DIAGNOSIS — D631 Anemia in chronic kidney disease: Secondary | ICD-10-CM | POA: Diagnosis not present

## 2014-01-19 DIAGNOSIS — N186 End stage renal disease: Secondary | ICD-10-CM | POA: Diagnosis not present

## 2014-01-20 DIAGNOSIS — Z79899 Other long term (current) drug therapy: Secondary | ICD-10-CM | POA: Diagnosis not present

## 2014-01-20 DIAGNOSIS — D631 Anemia in chronic kidney disease: Secondary | ICD-10-CM | POA: Diagnosis not present

## 2014-01-20 DIAGNOSIS — N2581 Secondary hyperparathyroidism of renal origin: Secondary | ICD-10-CM | POA: Diagnosis not present

## 2014-01-20 DIAGNOSIS — N186 End stage renal disease: Secondary | ICD-10-CM | POA: Diagnosis not present

## 2014-01-20 DIAGNOSIS — D509 Iron deficiency anemia, unspecified: Secondary | ICD-10-CM | POA: Diagnosis not present

## 2014-01-21 DIAGNOSIS — D631 Anemia in chronic kidney disease: Secondary | ICD-10-CM | POA: Diagnosis not present

## 2014-01-21 DIAGNOSIS — N186 End stage renal disease: Secondary | ICD-10-CM | POA: Diagnosis not present

## 2014-01-21 DIAGNOSIS — D509 Iron deficiency anemia, unspecified: Secondary | ICD-10-CM | POA: Diagnosis not present

## 2014-01-21 DIAGNOSIS — Z79899 Other long term (current) drug therapy: Secondary | ICD-10-CM | POA: Diagnosis not present

## 2014-01-21 DIAGNOSIS — N2581 Secondary hyperparathyroidism of renal origin: Secondary | ICD-10-CM | POA: Diagnosis not present

## 2014-01-22 DIAGNOSIS — Z79899 Other long term (current) drug therapy: Secondary | ICD-10-CM | POA: Diagnosis not present

## 2014-01-22 DIAGNOSIS — N186 End stage renal disease: Secondary | ICD-10-CM | POA: Diagnosis not present

## 2014-01-22 DIAGNOSIS — D509 Iron deficiency anemia, unspecified: Secondary | ICD-10-CM | POA: Diagnosis not present

## 2014-01-22 DIAGNOSIS — D631 Anemia in chronic kidney disease: Secondary | ICD-10-CM | POA: Diagnosis not present

## 2014-01-22 DIAGNOSIS — N2581 Secondary hyperparathyroidism of renal origin: Secondary | ICD-10-CM | POA: Diagnosis not present

## 2014-01-23 DIAGNOSIS — D509 Iron deficiency anemia, unspecified: Secondary | ICD-10-CM | POA: Diagnosis not present

## 2014-01-23 DIAGNOSIS — N2581 Secondary hyperparathyroidism of renal origin: Secondary | ICD-10-CM | POA: Diagnosis not present

## 2014-01-23 DIAGNOSIS — D631 Anemia in chronic kidney disease: Secondary | ICD-10-CM | POA: Diagnosis not present

## 2014-01-23 DIAGNOSIS — Z79899 Other long term (current) drug therapy: Secondary | ICD-10-CM | POA: Diagnosis not present

## 2014-01-23 DIAGNOSIS — N186 End stage renal disease: Secondary | ICD-10-CM | POA: Diagnosis not present

## 2014-01-24 DIAGNOSIS — D509 Iron deficiency anemia, unspecified: Secondary | ICD-10-CM | POA: Diagnosis not present

## 2014-01-24 DIAGNOSIS — Z79899 Other long term (current) drug therapy: Secondary | ICD-10-CM | POA: Diagnosis not present

## 2014-01-24 DIAGNOSIS — N2581 Secondary hyperparathyroidism of renal origin: Secondary | ICD-10-CM | POA: Diagnosis not present

## 2014-01-24 DIAGNOSIS — D631 Anemia in chronic kidney disease: Secondary | ICD-10-CM | POA: Diagnosis not present

## 2014-01-24 DIAGNOSIS — N186 End stage renal disease: Secondary | ICD-10-CM | POA: Diagnosis not present

## 2014-01-25 DIAGNOSIS — D509 Iron deficiency anemia, unspecified: Secondary | ICD-10-CM | POA: Diagnosis not present

## 2014-01-25 DIAGNOSIS — Z79899 Other long term (current) drug therapy: Secondary | ICD-10-CM | POA: Diagnosis not present

## 2014-01-25 DIAGNOSIS — N186 End stage renal disease: Secondary | ICD-10-CM | POA: Diagnosis not present

## 2014-01-25 DIAGNOSIS — N2581 Secondary hyperparathyroidism of renal origin: Secondary | ICD-10-CM | POA: Diagnosis not present

## 2014-01-25 DIAGNOSIS — D631 Anemia in chronic kidney disease: Secondary | ICD-10-CM | POA: Diagnosis not present

## 2014-01-26 DIAGNOSIS — D631 Anemia in chronic kidney disease: Secondary | ICD-10-CM | POA: Diagnosis not present

## 2014-01-26 DIAGNOSIS — N2581 Secondary hyperparathyroidism of renal origin: Secondary | ICD-10-CM | POA: Diagnosis not present

## 2014-01-26 DIAGNOSIS — N186 End stage renal disease: Secondary | ICD-10-CM | POA: Diagnosis not present

## 2014-01-26 DIAGNOSIS — D509 Iron deficiency anemia, unspecified: Secondary | ICD-10-CM | POA: Diagnosis not present

## 2014-01-26 DIAGNOSIS — Z79899 Other long term (current) drug therapy: Secondary | ICD-10-CM | POA: Diagnosis not present

## 2014-01-27 DIAGNOSIS — Z79899 Other long term (current) drug therapy: Secondary | ICD-10-CM | POA: Diagnosis not present

## 2014-01-27 DIAGNOSIS — D631 Anemia in chronic kidney disease: Secondary | ICD-10-CM | POA: Diagnosis not present

## 2014-01-27 DIAGNOSIS — D509 Iron deficiency anemia, unspecified: Secondary | ICD-10-CM | POA: Diagnosis not present

## 2014-01-27 DIAGNOSIS — N2581 Secondary hyperparathyroidism of renal origin: Secondary | ICD-10-CM | POA: Diagnosis not present

## 2014-01-27 DIAGNOSIS — N186 End stage renal disease: Secondary | ICD-10-CM | POA: Diagnosis not present

## 2014-01-28 DIAGNOSIS — Z79899 Other long term (current) drug therapy: Secondary | ICD-10-CM | POA: Diagnosis not present

## 2014-01-28 DIAGNOSIS — Z992 Dependence on renal dialysis: Secondary | ICD-10-CM | POA: Diagnosis not present

## 2014-01-28 DIAGNOSIS — N186 End stage renal disease: Secondary | ICD-10-CM | POA: Diagnosis not present

## 2014-01-28 DIAGNOSIS — N2581 Secondary hyperparathyroidism of renal origin: Secondary | ICD-10-CM | POA: Diagnosis not present

## 2014-01-29 DIAGNOSIS — Z79899 Other long term (current) drug therapy: Secondary | ICD-10-CM | POA: Diagnosis not present

## 2014-01-29 DIAGNOSIS — N186 End stage renal disease: Secondary | ICD-10-CM | POA: Diagnosis not present

## 2014-01-29 DIAGNOSIS — N2581 Secondary hyperparathyroidism of renal origin: Secondary | ICD-10-CM | POA: Diagnosis not present

## 2014-01-30 DIAGNOSIS — Z79899 Other long term (current) drug therapy: Secondary | ICD-10-CM | POA: Diagnosis not present

## 2014-01-30 DIAGNOSIS — N2581 Secondary hyperparathyroidism of renal origin: Secondary | ICD-10-CM | POA: Diagnosis not present

## 2014-01-30 DIAGNOSIS — N186 End stage renal disease: Secondary | ICD-10-CM | POA: Diagnosis not present

## 2014-01-31 DIAGNOSIS — N186 End stage renal disease: Secondary | ICD-10-CM | POA: Diagnosis not present

## 2014-01-31 DIAGNOSIS — N2581 Secondary hyperparathyroidism of renal origin: Secondary | ICD-10-CM | POA: Diagnosis not present

## 2014-01-31 DIAGNOSIS — Z79899 Other long term (current) drug therapy: Secondary | ICD-10-CM | POA: Diagnosis not present

## 2014-02-01 DIAGNOSIS — N186 End stage renal disease: Secondary | ICD-10-CM | POA: Diagnosis not present

## 2014-02-01 DIAGNOSIS — N2581 Secondary hyperparathyroidism of renal origin: Secondary | ICD-10-CM | POA: Diagnosis not present

## 2014-02-01 DIAGNOSIS — Z79899 Other long term (current) drug therapy: Secondary | ICD-10-CM | POA: Diagnosis not present

## 2014-02-02 DIAGNOSIS — Z79899 Other long term (current) drug therapy: Secondary | ICD-10-CM | POA: Diagnosis not present

## 2014-02-02 DIAGNOSIS — N186 End stage renal disease: Secondary | ICD-10-CM | POA: Diagnosis not present

## 2014-02-02 DIAGNOSIS — N2581 Secondary hyperparathyroidism of renal origin: Secondary | ICD-10-CM | POA: Diagnosis not present

## 2014-02-03 DIAGNOSIS — N186 End stage renal disease: Secondary | ICD-10-CM | POA: Diagnosis not present

## 2014-02-03 DIAGNOSIS — N2581 Secondary hyperparathyroidism of renal origin: Secondary | ICD-10-CM | POA: Diagnosis not present

## 2014-02-03 DIAGNOSIS — Z79899 Other long term (current) drug therapy: Secondary | ICD-10-CM | POA: Diagnosis not present

## 2014-02-04 DIAGNOSIS — Z79899 Other long term (current) drug therapy: Secondary | ICD-10-CM | POA: Diagnosis not present

## 2014-02-04 DIAGNOSIS — N186 End stage renal disease: Secondary | ICD-10-CM | POA: Diagnosis not present

## 2014-02-04 DIAGNOSIS — N2581 Secondary hyperparathyroidism of renal origin: Secondary | ICD-10-CM | POA: Diagnosis not present

## 2014-02-05 DIAGNOSIS — N2581 Secondary hyperparathyroidism of renal origin: Secondary | ICD-10-CM | POA: Diagnosis not present

## 2014-02-05 DIAGNOSIS — Z79899 Other long term (current) drug therapy: Secondary | ICD-10-CM | POA: Diagnosis not present

## 2014-02-05 DIAGNOSIS — N186 End stage renal disease: Secondary | ICD-10-CM | POA: Diagnosis not present

## 2014-02-06 DIAGNOSIS — Z79899 Other long term (current) drug therapy: Secondary | ICD-10-CM | POA: Diagnosis not present

## 2014-02-06 DIAGNOSIS — N186 End stage renal disease: Secondary | ICD-10-CM | POA: Diagnosis not present

## 2014-02-06 DIAGNOSIS — N2581 Secondary hyperparathyroidism of renal origin: Secondary | ICD-10-CM | POA: Diagnosis not present

## 2014-02-07 DIAGNOSIS — N2581 Secondary hyperparathyroidism of renal origin: Secondary | ICD-10-CM | POA: Diagnosis not present

## 2014-02-07 DIAGNOSIS — N186 End stage renal disease: Secondary | ICD-10-CM | POA: Diagnosis not present

## 2014-02-07 DIAGNOSIS — Z79899 Other long term (current) drug therapy: Secondary | ICD-10-CM | POA: Diagnosis not present

## 2014-02-08 DIAGNOSIS — Z79899 Other long term (current) drug therapy: Secondary | ICD-10-CM | POA: Diagnosis not present

## 2014-02-08 DIAGNOSIS — N2581 Secondary hyperparathyroidism of renal origin: Secondary | ICD-10-CM | POA: Diagnosis not present

## 2014-02-08 DIAGNOSIS — N186 End stage renal disease: Secondary | ICD-10-CM | POA: Diagnosis not present

## 2014-02-09 DIAGNOSIS — Z79899 Other long term (current) drug therapy: Secondary | ICD-10-CM | POA: Diagnosis not present

## 2014-02-09 DIAGNOSIS — N186 End stage renal disease: Secondary | ICD-10-CM | POA: Diagnosis not present

## 2014-02-09 DIAGNOSIS — N2581 Secondary hyperparathyroidism of renal origin: Secondary | ICD-10-CM | POA: Diagnosis not present

## 2014-02-10 DIAGNOSIS — N2581 Secondary hyperparathyroidism of renal origin: Secondary | ICD-10-CM | POA: Diagnosis not present

## 2014-02-10 DIAGNOSIS — N186 End stage renal disease: Secondary | ICD-10-CM | POA: Diagnosis not present

## 2014-02-10 DIAGNOSIS — Z79899 Other long term (current) drug therapy: Secondary | ICD-10-CM | POA: Diagnosis not present

## 2014-02-11 DIAGNOSIS — E8770 Fluid overload, unspecified: Secondary | ICD-10-CM | POA: Diagnosis not present

## 2014-02-11 DIAGNOSIS — Z992 Dependence on renal dialysis: Secondary | ICD-10-CM | POA: Diagnosis not present

## 2014-02-11 DIAGNOSIS — D72828 Other elevated white blood cell count: Secondary | ICD-10-CM | POA: Diagnosis not present

## 2014-02-11 DIAGNOSIS — N051 Unspecified nephritic syndrome with focal and segmental glomerular lesions: Secondary | ICD-10-CM | POA: Diagnosis not present

## 2014-02-11 DIAGNOSIS — E876 Hypokalemia: Secondary | ICD-10-CM | POA: Diagnosis not present

## 2014-02-11 DIAGNOSIS — N186 End stage renal disease: Secondary | ICD-10-CM | POA: Diagnosis not present

## 2014-02-11 DIAGNOSIS — I120 Hypertensive chronic kidney disease with stage 5 chronic kidney disease or end stage renal disease: Secondary | ICD-10-CM | POA: Diagnosis not present

## 2014-02-11 DIAGNOSIS — S0502XA Injury of conjunctiva and corneal abrasion without foreign body, left eye, initial encounter: Secondary | ICD-10-CM | POA: Diagnosis not present

## 2014-02-11 DIAGNOSIS — Z940 Kidney transplant status: Secondary | ICD-10-CM | POA: Diagnosis not present

## 2014-02-11 DIAGNOSIS — D649 Anemia, unspecified: Secondary | ICD-10-CM | POA: Diagnosis present

## 2014-02-11 DIAGNOSIS — Z01818 Encounter for other preprocedural examination: Secondary | ICD-10-CM | POA: Diagnosis not present

## 2014-02-11 DIAGNOSIS — Z4822 Encounter for aftercare following kidney transplant: Secondary | ICD-10-CM | POA: Diagnosis not present

## 2014-02-11 DIAGNOSIS — I10 Essential (primary) hypertension: Secondary | ICD-10-CM | POA: Diagnosis not present

## 2014-02-11 DIAGNOSIS — Z452 Encounter for adjustment and management of vascular access device: Secondary | ICD-10-CM | POA: Diagnosis not present

## 2014-02-11 DIAGNOSIS — Z79899 Other long term (current) drug therapy: Secondary | ICD-10-CM | POA: Diagnosis not present

## 2014-02-11 DIAGNOSIS — R0689 Other abnormalities of breathing: Secondary | ICD-10-CM | POA: Diagnosis not present

## 2014-02-11 DIAGNOSIS — I1311 Hypertensive heart and chronic kidney disease without heart failure, with stage 5 chronic kidney disease, or end stage renal disease: Secondary | ICD-10-CM | POA: Diagnosis not present

## 2014-02-11 DIAGNOSIS — Z524 Kidney donor: Secondary | ICD-10-CM | POA: Diagnosis not present

## 2014-02-11 DIAGNOSIS — N2581 Secondary hyperparathyroidism of renal origin: Secondary | ICD-10-CM | POA: Diagnosis not present

## 2014-02-11 DIAGNOSIS — E785 Hyperlipidemia, unspecified: Secondary | ICD-10-CM | POA: Diagnosis present

## 2014-02-11 DIAGNOSIS — Z5181 Encounter for therapeutic drug level monitoring: Secondary | ICD-10-CM | POA: Diagnosis not present

## 2014-02-11 DIAGNOSIS — D72829 Elevated white blood cell count, unspecified: Secondary | ICD-10-CM | POA: Diagnosis present

## 2014-02-14 DIAGNOSIS — S0502XA Injury of conjunctiva and corneal abrasion without foreign body, left eye, initial encounter: Secondary | ICD-10-CM | POA: Diagnosis not present

## 2014-02-14 DIAGNOSIS — Z524 Kidney donor: Secondary | ICD-10-CM | POA: Diagnosis not present

## 2014-02-16 DIAGNOSIS — D849 Immunodeficiency, unspecified: Secondary | ICD-10-CM | POA: Insufficient documentation

## 2014-02-18 DIAGNOSIS — N186 End stage renal disease: Secondary | ICD-10-CM | POA: Diagnosis not present

## 2014-02-18 DIAGNOSIS — I10 Essential (primary) hypertension: Secondary | ICD-10-CM | POA: Diagnosis not present

## 2014-02-18 DIAGNOSIS — Z79899 Other long term (current) drug therapy: Secondary | ICD-10-CM | POA: Diagnosis not present

## 2014-02-18 DIAGNOSIS — K5900 Constipation, unspecified: Secondary | ICD-10-CM | POA: Diagnosis not present

## 2014-02-18 DIAGNOSIS — E785 Hyperlipidemia, unspecified: Secondary | ICD-10-CM | POA: Diagnosis not present

## 2014-02-18 DIAGNOSIS — D631 Anemia in chronic kidney disease: Secondary | ICD-10-CM | POA: Diagnosis not present

## 2014-02-18 DIAGNOSIS — I120 Hypertensive chronic kidney disease with stage 5 chronic kidney disease or end stage renal disease: Secondary | ICD-10-CM | POA: Diagnosis not present

## 2014-02-18 DIAGNOSIS — Z792 Long term (current) use of antibiotics: Secondary | ICD-10-CM | POA: Diagnosis not present

## 2014-02-18 DIAGNOSIS — D8989 Other specified disorders involving the immune mechanism, not elsewhere classified: Secondary | ICD-10-CM | POA: Diagnosis not present

## 2014-02-18 DIAGNOSIS — Z7982 Long term (current) use of aspirin: Secondary | ICD-10-CM | POA: Diagnosis not present

## 2014-02-18 DIAGNOSIS — D899 Disorder involving the immune mechanism, unspecified: Secondary | ICD-10-CM | POA: Diagnosis not present

## 2014-02-18 DIAGNOSIS — Z940 Kidney transplant status: Secondary | ICD-10-CM | POA: Diagnosis not present

## 2014-02-18 DIAGNOSIS — Z7952 Long term (current) use of systemic steroids: Secondary | ICD-10-CM | POA: Diagnosis not present

## 2014-02-20 DIAGNOSIS — N289 Disorder of kidney and ureter, unspecified: Secondary | ICD-10-CM | POA: Diagnosis not present

## 2014-02-20 DIAGNOSIS — Z940 Kidney transplant status: Secondary | ICD-10-CM | POA: Diagnosis not present

## 2014-02-20 DIAGNOSIS — Z79899 Other long term (current) drug therapy: Secondary | ICD-10-CM | POA: Insufficient documentation

## 2014-02-20 DIAGNOSIS — I151 Hypertension secondary to other renal disorders: Secondary | ICD-10-CM | POA: Insufficient documentation

## 2014-02-24 DIAGNOSIS — D72829 Elevated white blood cell count, unspecified: Secondary | ICD-10-CM | POA: Diagnosis not present

## 2014-02-24 DIAGNOSIS — D899 Disorder involving the immune mechanism, unspecified: Secondary | ICD-10-CM | POA: Diagnosis not present

## 2014-02-24 DIAGNOSIS — Z09 Encounter for follow-up examination after completed treatment for conditions other than malignant neoplasm: Secondary | ICD-10-CM | POA: Diagnosis not present

## 2014-02-24 DIAGNOSIS — I151 Hypertension secondary to other renal disorders: Secondary | ICD-10-CM | POA: Diagnosis not present

## 2014-02-24 DIAGNOSIS — Z940 Kidney transplant status: Secondary | ICD-10-CM | POA: Diagnosis not present

## 2014-02-24 DIAGNOSIS — N032 Chronic nephritic syndrome with diffuse membranous glomerulonephritis: Secondary | ICD-10-CM | POA: Diagnosis not present

## 2014-02-24 DIAGNOSIS — I10 Essential (primary) hypertension: Secondary | ICD-10-CM | POA: Diagnosis not present

## 2014-02-24 DIAGNOSIS — E785 Hyperlipidemia, unspecified: Secondary | ICD-10-CM | POA: Diagnosis not present

## 2014-02-24 DIAGNOSIS — Z79899 Other long term (current) drug therapy: Secondary | ICD-10-CM | POA: Diagnosis not present

## 2014-02-24 DIAGNOSIS — N186 End stage renal disease: Secondary | ICD-10-CM | POA: Diagnosis not present

## 2014-02-24 DIAGNOSIS — N289 Disorder of kidney and ureter, unspecified: Secondary | ICD-10-CM | POA: Diagnosis not present

## 2014-02-24 DIAGNOSIS — I120 Hypertensive chronic kidney disease with stage 5 chronic kidney disease or end stage renal disease: Secondary | ICD-10-CM | POA: Diagnosis not present

## 2014-02-27 DIAGNOSIS — Z79899 Other long term (current) drug therapy: Secondary | ICD-10-CM | POA: Diagnosis not present

## 2014-02-27 DIAGNOSIS — D899 Disorder involving the immune mechanism, unspecified: Secondary | ICD-10-CM | POA: Diagnosis not present

## 2014-02-27 DIAGNOSIS — Z940 Kidney transplant status: Secondary | ICD-10-CM | POA: Diagnosis not present

## 2014-02-27 DIAGNOSIS — Z792 Long term (current) use of antibiotics: Secondary | ICD-10-CM | POA: Diagnosis not present

## 2014-02-27 DIAGNOSIS — Z5181 Encounter for therapeutic drug level monitoring: Secondary | ICD-10-CM | POA: Diagnosis not present

## 2014-02-27 DIAGNOSIS — D8989 Other specified disorders involving the immune mechanism, not elsewhere classified: Secondary | ICD-10-CM | POA: Diagnosis not present

## 2014-02-27 DIAGNOSIS — Z7952 Long term (current) use of systemic steroids: Secondary | ICD-10-CM | POA: Diagnosis not present

## 2014-02-27 DIAGNOSIS — Z7982 Long term (current) use of aspirin: Secondary | ICD-10-CM | POA: Diagnosis not present

## 2014-03-03 DIAGNOSIS — Z940 Kidney transplant status: Secondary | ICD-10-CM | POA: Diagnosis not present

## 2014-03-03 DIAGNOSIS — D899 Disorder involving the immune mechanism, unspecified: Secondary | ICD-10-CM | POA: Diagnosis not present

## 2014-03-06 DIAGNOSIS — Z79899 Other long term (current) drug therapy: Secondary | ICD-10-CM | POA: Diagnosis not present

## 2014-03-06 DIAGNOSIS — Z792 Long term (current) use of antibiotics: Secondary | ICD-10-CM | POA: Diagnosis not present

## 2014-03-06 DIAGNOSIS — D899 Disorder involving the immune mechanism, unspecified: Secondary | ICD-10-CM | POA: Diagnosis not present

## 2014-03-06 DIAGNOSIS — Z940 Kidney transplant status: Secondary | ICD-10-CM | POA: Diagnosis not present

## 2014-03-06 DIAGNOSIS — D8989 Other specified disorders involving the immune mechanism, not elsewhere classified: Secondary | ICD-10-CM | POA: Diagnosis not present

## 2014-03-06 DIAGNOSIS — Z7952 Long term (current) use of systemic steroids: Secondary | ICD-10-CM | POA: Diagnosis not present

## 2014-03-10 DIAGNOSIS — E119 Type 2 diabetes mellitus without complications: Secondary | ICD-10-CM | POA: Diagnosis not present

## 2014-03-10 DIAGNOSIS — I120 Hypertensive chronic kidney disease with stage 5 chronic kidney disease or end stage renal disease: Secondary | ICD-10-CM | POA: Diagnosis not present

## 2014-03-10 DIAGNOSIS — D899 Disorder involving the immune mechanism, unspecified: Secondary | ICD-10-CM | POA: Diagnosis not present

## 2014-03-10 DIAGNOSIS — I151 Hypertension secondary to other renal disorders: Secondary | ICD-10-CM | POA: Diagnosis not present

## 2014-03-10 DIAGNOSIS — R61 Generalized hyperhidrosis: Secondary | ICD-10-CM | POA: Diagnosis not present

## 2014-03-10 DIAGNOSIS — I10 Essential (primary) hypertension: Secondary | ICD-10-CM | POA: Diagnosis not present

## 2014-03-10 DIAGNOSIS — N289 Disorder of kidney and ureter, unspecified: Secondary | ICD-10-CM | POA: Diagnosis not present

## 2014-03-10 DIAGNOSIS — Z7952 Long term (current) use of systemic steroids: Secondary | ICD-10-CM | POA: Diagnosis not present

## 2014-03-10 DIAGNOSIS — E785 Hyperlipidemia, unspecified: Secondary | ICD-10-CM | POA: Diagnosis not present

## 2014-03-10 DIAGNOSIS — Z7982 Long term (current) use of aspirin: Secondary | ICD-10-CM | POA: Diagnosis not present

## 2014-03-10 DIAGNOSIS — Z940 Kidney transplant status: Secondary | ICD-10-CM | POA: Diagnosis not present

## 2014-03-10 DIAGNOSIS — N032 Chronic nephritic syndrome with diffuse membranous glomerulonephritis: Secondary | ICD-10-CM | POA: Diagnosis not present

## 2014-03-10 DIAGNOSIS — N186 End stage renal disease: Secondary | ICD-10-CM | POA: Diagnosis not present

## 2014-03-10 DIAGNOSIS — Z79899 Other long term (current) drug therapy: Secondary | ICD-10-CM | POA: Diagnosis not present

## 2014-03-10 DIAGNOSIS — E11649 Type 2 diabetes mellitus with hypoglycemia without coma: Secondary | ICD-10-CM | POA: Diagnosis not present

## 2014-03-10 DIAGNOSIS — Z6825 Body mass index (BMI) 25.0-25.9, adult: Secondary | ICD-10-CM | POA: Diagnosis not present

## 2014-03-10 DIAGNOSIS — Z792 Long term (current) use of antibiotics: Secondary | ICD-10-CM | POA: Diagnosis not present

## 2014-03-13 DIAGNOSIS — D899 Disorder involving the immune mechanism, unspecified: Secondary | ICD-10-CM | POA: Diagnosis not present

## 2014-03-13 DIAGNOSIS — Z79899 Other long term (current) drug therapy: Secondary | ICD-10-CM | POA: Diagnosis not present

## 2014-03-13 DIAGNOSIS — E119 Type 2 diabetes mellitus without complications: Secondary | ICD-10-CM | POA: Diagnosis not present

## 2014-03-13 DIAGNOSIS — Z792 Long term (current) use of antibiotics: Secondary | ICD-10-CM | POA: Diagnosis not present

## 2014-03-13 DIAGNOSIS — Z7952 Long term (current) use of systemic steroids: Secondary | ICD-10-CM | POA: Diagnosis not present

## 2014-03-13 DIAGNOSIS — Z940 Kidney transplant status: Secondary | ICD-10-CM | POA: Diagnosis not present

## 2014-03-13 DIAGNOSIS — I10 Essential (primary) hypertension: Secondary | ICD-10-CM | POA: Diagnosis not present

## 2014-03-13 DIAGNOSIS — Z7982 Long term (current) use of aspirin: Secondary | ICD-10-CM | POA: Diagnosis not present

## 2014-03-19 DIAGNOSIS — Z7952 Long term (current) use of systemic steroids: Secondary | ICD-10-CM | POA: Diagnosis not present

## 2014-03-19 DIAGNOSIS — Z792 Long term (current) use of antibiotics: Secondary | ICD-10-CM | POA: Diagnosis not present

## 2014-03-19 DIAGNOSIS — D8989 Other specified disorders involving the immune mechanism, not elsewhere classified: Secondary | ICD-10-CM | POA: Diagnosis not present

## 2014-03-19 DIAGNOSIS — Z940 Kidney transplant status: Secondary | ICD-10-CM | POA: Diagnosis not present

## 2014-03-19 DIAGNOSIS — D899 Disorder involving the immune mechanism, unspecified: Secondary | ICD-10-CM | POA: Diagnosis not present

## 2014-03-19 DIAGNOSIS — E785 Hyperlipidemia, unspecified: Secondary | ICD-10-CM | POA: Diagnosis not present

## 2014-03-19 DIAGNOSIS — Z5181 Encounter for therapeutic drug level monitoring: Secondary | ICD-10-CM | POA: Diagnosis not present

## 2014-03-19 DIAGNOSIS — Z7982 Long term (current) use of aspirin: Secondary | ICD-10-CM | POA: Diagnosis not present

## 2014-03-19 DIAGNOSIS — Z6825 Body mass index (BMI) 25.0-25.9, adult: Secondary | ICD-10-CM | POA: Diagnosis not present

## 2014-03-19 DIAGNOSIS — D631 Anemia in chronic kidney disease: Secondary | ICD-10-CM | POA: Diagnosis not present

## 2014-03-19 DIAGNOSIS — I120 Hypertensive chronic kidney disease with stage 5 chronic kidney disease or end stage renal disease: Secondary | ICD-10-CM | POA: Diagnosis not present

## 2014-03-19 DIAGNOSIS — E119 Type 2 diabetes mellitus without complications: Secondary | ICD-10-CM | POA: Diagnosis not present

## 2014-03-25 DIAGNOSIS — E119 Type 2 diabetes mellitus without complications: Secondary | ICD-10-CM | POA: Diagnosis not present

## 2014-03-25 DIAGNOSIS — Z7982 Long term (current) use of aspirin: Secondary | ICD-10-CM | POA: Diagnosis not present

## 2014-03-25 DIAGNOSIS — D8989 Other specified disorders involving the immune mechanism, not elsewhere classified: Secondary | ICD-10-CM | POA: Diagnosis not present

## 2014-03-25 DIAGNOSIS — D649 Anemia, unspecified: Secondary | ICD-10-CM | POA: Diagnosis not present

## 2014-03-25 DIAGNOSIS — Z940 Kidney transplant status: Secondary | ICD-10-CM | POA: Diagnosis not present

## 2014-03-25 DIAGNOSIS — Z4822 Encounter for aftercare following kidney transplant: Secondary | ICD-10-CM | POA: Diagnosis not present

## 2014-03-25 DIAGNOSIS — I10 Essential (primary) hypertension: Secondary | ICD-10-CM | POA: Diagnosis not present

## 2014-03-25 DIAGNOSIS — E785 Hyperlipidemia, unspecified: Secondary | ICD-10-CM | POA: Diagnosis not present

## 2014-03-27 DIAGNOSIS — Z79899 Other long term (current) drug therapy: Secondary | ICD-10-CM | POA: Diagnosis not present

## 2014-03-27 DIAGNOSIS — Z7982 Long term (current) use of aspirin: Secondary | ICD-10-CM | POA: Diagnosis not present

## 2014-03-27 DIAGNOSIS — Z4822 Encounter for aftercare following kidney transplant: Secondary | ICD-10-CM | POA: Diagnosis not present

## 2014-03-27 DIAGNOSIS — I10 Essential (primary) hypertension: Secondary | ICD-10-CM | POA: Diagnosis not present

## 2014-03-27 DIAGNOSIS — Z7952 Long term (current) use of systemic steroids: Secondary | ICD-10-CM | POA: Diagnosis not present

## 2014-03-27 DIAGNOSIS — N186 End stage renal disease: Secondary | ICD-10-CM | POA: Diagnosis not present

## 2014-03-27 DIAGNOSIS — N179 Acute kidney failure, unspecified: Secondary | ICD-10-CM | POA: Diagnosis not present

## 2014-03-27 DIAGNOSIS — D8989 Other specified disorders involving the immune mechanism, not elsewhere classified: Secondary | ICD-10-CM | POA: Diagnosis not present

## 2014-03-27 DIAGNOSIS — R739 Hyperglycemia, unspecified: Secondary | ICD-10-CM | POA: Diagnosis not present

## 2014-03-27 DIAGNOSIS — D899 Disorder involving the immune mechanism, unspecified: Secondary | ICD-10-CM | POA: Diagnosis not present

## 2014-03-27 DIAGNOSIS — Z792 Long term (current) use of antibiotics: Secondary | ICD-10-CM | POA: Diagnosis not present

## 2014-03-27 DIAGNOSIS — I120 Hypertensive chronic kidney disease with stage 5 chronic kidney disease or end stage renal disease: Secondary | ICD-10-CM | POA: Diagnosis not present

## 2014-03-27 DIAGNOSIS — Z940 Kidney transplant status: Secondary | ICD-10-CM | POA: Diagnosis not present

## 2014-04-01 DIAGNOSIS — Z7982 Long term (current) use of aspirin: Secondary | ICD-10-CM | POA: Diagnosis not present

## 2014-04-01 DIAGNOSIS — I120 Hypertensive chronic kidney disease with stage 5 chronic kidney disease or end stage renal disease: Secondary | ICD-10-CM | POA: Diagnosis not present

## 2014-04-01 DIAGNOSIS — D8989 Other specified disorders involving the immune mechanism, not elsewhere classified: Secondary | ICD-10-CM | POA: Diagnosis not present

## 2014-04-01 DIAGNOSIS — E119 Type 2 diabetes mellitus without complications: Secondary | ICD-10-CM | POA: Diagnosis not present

## 2014-04-01 DIAGNOSIS — D899 Disorder involving the immune mechanism, unspecified: Secondary | ICD-10-CM | POA: Diagnosis not present

## 2014-04-01 DIAGNOSIS — R739 Hyperglycemia, unspecified: Secondary | ICD-10-CM | POA: Diagnosis not present

## 2014-04-01 DIAGNOSIS — Z79899 Other long term (current) drug therapy: Secondary | ICD-10-CM | POA: Diagnosis not present

## 2014-04-01 DIAGNOSIS — Z940 Kidney transplant status: Secondary | ICD-10-CM | POA: Diagnosis not present

## 2014-04-01 DIAGNOSIS — N186 End stage renal disease: Secondary | ICD-10-CM | POA: Diagnosis not present

## 2014-04-01 DIAGNOSIS — N269 Renal sclerosis, unspecified: Secondary | ICD-10-CM | POA: Diagnosis not present

## 2014-04-10 DIAGNOSIS — Z79899 Other long term (current) drug therapy: Secondary | ICD-10-CM | POA: Diagnosis not present

## 2014-04-10 DIAGNOSIS — N032 Chronic nephritic syndrome with diffuse membranous glomerulonephritis: Secondary | ICD-10-CM | POA: Diagnosis not present

## 2014-04-10 DIAGNOSIS — Z7952 Long term (current) use of systemic steroids: Secondary | ICD-10-CM | POA: Diagnosis not present

## 2014-04-10 DIAGNOSIS — I10 Essential (primary) hypertension: Secondary | ICD-10-CM | POA: Diagnosis not present

## 2014-04-10 DIAGNOSIS — E119 Type 2 diabetes mellitus without complications: Secondary | ICD-10-CM | POA: Diagnosis not present

## 2014-04-10 DIAGNOSIS — D899 Disorder involving the immune mechanism, unspecified: Secondary | ICD-10-CM | POA: Diagnosis not present

## 2014-04-10 DIAGNOSIS — E785 Hyperlipidemia, unspecified: Secondary | ICD-10-CM | POA: Diagnosis not present

## 2014-04-10 DIAGNOSIS — Z792 Long term (current) use of antibiotics: Secondary | ICD-10-CM | POA: Diagnosis not present

## 2014-04-10 DIAGNOSIS — I120 Hypertensive chronic kidney disease with stage 5 chronic kidney disease or end stage renal disease: Secondary | ICD-10-CM | POA: Diagnosis not present

## 2014-04-10 DIAGNOSIS — I151 Hypertension secondary to other renal disorders: Secondary | ICD-10-CM | POA: Diagnosis not present

## 2014-04-10 DIAGNOSIS — Z940 Kidney transplant status: Secondary | ICD-10-CM | POA: Diagnosis not present

## 2014-04-10 DIAGNOSIS — N186 End stage renal disease: Secondary | ICD-10-CM | POA: Diagnosis not present

## 2014-04-10 DIAGNOSIS — N289 Disorder of kidney and ureter, unspecified: Secondary | ICD-10-CM | POA: Diagnosis not present

## 2014-04-15 DIAGNOSIS — Z7901 Long term (current) use of anticoagulants: Secondary | ICD-10-CM | POA: Diagnosis not present

## 2014-04-15 DIAGNOSIS — Z7952 Long term (current) use of systemic steroids: Secondary | ICD-10-CM | POA: Diagnosis not present

## 2014-04-15 DIAGNOSIS — N186 End stage renal disease: Secondary | ICD-10-CM | POA: Diagnosis not present

## 2014-04-15 DIAGNOSIS — D631 Anemia in chronic kidney disease: Secondary | ICD-10-CM | POA: Diagnosis not present

## 2014-04-15 DIAGNOSIS — E785 Hyperlipidemia, unspecified: Secondary | ICD-10-CM | POA: Diagnosis not present

## 2014-04-15 DIAGNOSIS — D8989 Other specified disorders involving the immune mechanism, not elsewhere classified: Secondary | ICD-10-CM | POA: Diagnosis not present

## 2014-04-15 DIAGNOSIS — Z7982 Long term (current) use of aspirin: Secondary | ICD-10-CM | POA: Diagnosis not present

## 2014-04-15 DIAGNOSIS — E119 Type 2 diabetes mellitus without complications: Secondary | ICD-10-CM | POA: Diagnosis not present

## 2014-04-15 DIAGNOSIS — Z79899 Other long term (current) drug therapy: Secondary | ICD-10-CM | POA: Diagnosis not present

## 2014-04-15 DIAGNOSIS — Z792 Long term (current) use of antibiotics: Secondary | ICD-10-CM | POA: Diagnosis not present

## 2014-04-15 DIAGNOSIS — Z940 Kidney transplant status: Secondary | ICD-10-CM | POA: Diagnosis not present

## 2014-04-15 DIAGNOSIS — I120 Hypertensive chronic kidney disease with stage 5 chronic kidney disease or end stage renal disease: Secondary | ICD-10-CM | POA: Diagnosis not present

## 2014-05-01 DIAGNOSIS — I120 Hypertensive chronic kidney disease with stage 5 chronic kidney disease or end stage renal disease: Secondary | ICD-10-CM | POA: Diagnosis not present

## 2014-05-01 DIAGNOSIS — E119 Type 2 diabetes mellitus without complications: Secondary | ICD-10-CM | POA: Diagnosis not present

## 2014-05-01 DIAGNOSIS — E1122 Type 2 diabetes mellitus with diabetic chronic kidney disease: Secondary | ICD-10-CM | POA: Diagnosis not present

## 2014-05-01 DIAGNOSIS — Z940 Kidney transplant status: Secondary | ICD-10-CM | POA: Diagnosis not present

## 2014-05-01 DIAGNOSIS — N032 Chronic nephritic syndrome with diffuse membranous glomerulonephritis: Secondary | ICD-10-CM | POA: Diagnosis not present

## 2014-05-01 DIAGNOSIS — Z79899 Other long term (current) drug therapy: Secondary | ICD-10-CM | POA: Diagnosis not present

## 2014-05-01 DIAGNOSIS — Z992 Dependence on renal dialysis: Secondary | ICD-10-CM | POA: Diagnosis not present

## 2014-05-01 DIAGNOSIS — N186 End stage renal disease: Secondary | ICD-10-CM | POA: Diagnosis not present

## 2014-05-01 DIAGNOSIS — D631 Anemia in chronic kidney disease: Secondary | ICD-10-CM | POA: Diagnosis not present

## 2014-05-01 DIAGNOSIS — E785 Hyperlipidemia, unspecified: Secondary | ICD-10-CM | POA: Diagnosis not present

## 2014-05-01 DIAGNOSIS — D899 Disorder involving the immune mechanism, unspecified: Secondary | ICD-10-CM | POA: Diagnosis not present

## 2014-05-15 DIAGNOSIS — Z79899 Other long term (current) drug therapy: Secondary | ICD-10-CM | POA: Diagnosis not present

## 2014-05-15 DIAGNOSIS — Z792 Long term (current) use of antibiotics: Secondary | ICD-10-CM | POA: Diagnosis not present

## 2014-05-15 DIAGNOSIS — Z940 Kidney transplant status: Secondary | ICD-10-CM | POA: Diagnosis not present

## 2014-05-15 DIAGNOSIS — Z7952 Long term (current) use of systemic steroids: Secondary | ICD-10-CM | POA: Diagnosis not present

## 2014-05-15 DIAGNOSIS — Z5181 Encounter for therapeutic drug level monitoring: Secondary | ICD-10-CM | POA: Diagnosis not present

## 2014-05-15 DIAGNOSIS — N289 Disorder of kidney and ureter, unspecified: Secondary | ICD-10-CM | POA: Diagnosis not present

## 2014-05-15 DIAGNOSIS — I151 Hypertension secondary to other renal disorders: Secondary | ICD-10-CM | POA: Diagnosis not present

## 2014-05-15 DIAGNOSIS — N186 End stage renal disease: Secondary | ICD-10-CM | POA: Diagnosis not present

## 2014-05-15 DIAGNOSIS — D631 Anemia in chronic kidney disease: Secondary | ICD-10-CM | POA: Diagnosis not present

## 2014-05-15 DIAGNOSIS — D899 Disorder involving the immune mechanism, unspecified: Secondary | ICD-10-CM | POA: Diagnosis not present

## 2014-05-15 DIAGNOSIS — I120 Hypertensive chronic kidney disease with stage 5 chronic kidney disease or end stage renal disease: Secondary | ICD-10-CM | POA: Diagnosis not present

## 2014-05-15 DIAGNOSIS — T380X5S Adverse effect of glucocorticoids and synthetic analogues, sequela: Secondary | ICD-10-CM | POA: Diagnosis not present

## 2014-05-15 DIAGNOSIS — E099 Drug or chemical induced diabetes mellitus without complications: Secondary | ICD-10-CM | POA: Diagnosis not present

## 2014-05-15 DIAGNOSIS — N032 Chronic nephritic syndrome with diffuse membranous glomerulonephritis: Secondary | ICD-10-CM | POA: Diagnosis not present

## 2014-05-15 DIAGNOSIS — E785 Hyperlipidemia, unspecified: Secondary | ICD-10-CM | POA: Diagnosis not present

## 2014-05-29 DIAGNOSIS — Z940 Kidney transplant status: Secondary | ICD-10-CM | POA: Diagnosis not present

## 2014-05-29 DIAGNOSIS — I10 Essential (primary) hypertension: Secondary | ICD-10-CM | POA: Diagnosis not present

## 2014-05-29 DIAGNOSIS — R739 Hyperglycemia, unspecified: Secondary | ICD-10-CM | POA: Diagnosis not present

## 2014-05-29 DIAGNOSIS — D899 Disorder involving the immune mechanism, unspecified: Secondary | ICD-10-CM | POA: Diagnosis not present

## 2014-05-29 DIAGNOSIS — Z7952 Long term (current) use of systemic steroids: Secondary | ICD-10-CM | POA: Diagnosis not present

## 2014-05-29 DIAGNOSIS — Z79899 Other long term (current) drug therapy: Secondary | ICD-10-CM | POA: Diagnosis not present

## 2014-05-29 DIAGNOSIS — Z792 Long term (current) use of antibiotics: Secondary | ICD-10-CM | POA: Diagnosis not present

## 2014-06-26 DIAGNOSIS — D899 Disorder involving the immune mechanism, unspecified: Secondary | ICD-10-CM | POA: Diagnosis not present

## 2014-06-26 DIAGNOSIS — D8989 Other specified disorders involving the immune mechanism, not elsewhere classified: Secondary | ICD-10-CM | POA: Diagnosis not present

## 2014-06-26 DIAGNOSIS — Z940 Kidney transplant status: Secondary | ICD-10-CM | POA: Diagnosis not present

## 2014-06-26 DIAGNOSIS — E139 Other specified diabetes mellitus without complications: Secondary | ICD-10-CM | POA: Diagnosis not present

## 2014-06-26 DIAGNOSIS — I10 Essential (primary) hypertension: Secondary | ICD-10-CM | POA: Diagnosis not present

## 2014-06-26 DIAGNOSIS — Z79899 Other long term (current) drug therapy: Secondary | ICD-10-CM | POA: Diagnosis not present

## 2014-07-24 DIAGNOSIS — Z79899 Other long term (current) drug therapy: Secondary | ICD-10-CM | POA: Diagnosis not present

## 2014-07-24 DIAGNOSIS — E139 Other specified diabetes mellitus without complications: Secondary | ICD-10-CM | POA: Diagnosis not present

## 2014-07-24 DIAGNOSIS — I10 Essential (primary) hypertension: Secondary | ICD-10-CM | POA: Diagnosis not present

## 2014-07-24 DIAGNOSIS — D899 Disorder involving the immune mechanism, unspecified: Secondary | ICD-10-CM | POA: Diagnosis not present

## 2014-07-24 DIAGNOSIS — Z7902 Long term (current) use of antithrombotics/antiplatelets: Secondary | ICD-10-CM | POA: Diagnosis not present

## 2014-07-24 DIAGNOSIS — N186 End stage renal disease: Secondary | ICD-10-CM | POA: Diagnosis not present

## 2014-07-24 DIAGNOSIS — D631 Anemia in chronic kidney disease: Secondary | ICD-10-CM | POA: Diagnosis not present

## 2014-07-24 DIAGNOSIS — Z792 Long term (current) use of antibiotics: Secondary | ICD-10-CM | POA: Diagnosis not present

## 2014-07-24 DIAGNOSIS — I120 Hypertensive chronic kidney disease with stage 5 chronic kidney disease or end stage renal disease: Secondary | ICD-10-CM | POA: Diagnosis not present

## 2014-07-24 DIAGNOSIS — Z940 Kidney transplant status: Secondary | ICD-10-CM | POA: Diagnosis not present

## 2014-07-24 DIAGNOSIS — N032 Chronic nephritic syndrome with diffuse membranous glomerulonephritis: Secondary | ICD-10-CM | POA: Diagnosis not present

## 2014-07-24 DIAGNOSIS — E785 Hyperlipidemia, unspecified: Secondary | ICD-10-CM | POA: Diagnosis not present

## 2014-08-25 ENCOUNTER — Other Ambulatory Visit: Payer: Self-pay | Admitting: Obstetrics & Gynecology

## 2014-08-25 DIAGNOSIS — Z1231 Encounter for screening mammogram for malignant neoplasm of breast: Secondary | ICD-10-CM | POA: Diagnosis not present

## 2014-08-25 DIAGNOSIS — Z6827 Body mass index (BMI) 27.0-27.9, adult: Secondary | ICD-10-CM | POA: Diagnosis not present

## 2014-08-25 DIAGNOSIS — Z124 Encounter for screening for malignant neoplasm of cervix: Secondary | ICD-10-CM | POA: Diagnosis not present

## 2014-08-25 DIAGNOSIS — Z01419 Encounter for gynecological examination (general) (routine) without abnormal findings: Secondary | ICD-10-CM | POA: Diagnosis not present

## 2014-08-26 LAB — CYTOLOGY - PAP

## 2014-09-02 DIAGNOSIS — Z992 Dependence on renal dialysis: Secondary | ICD-10-CM | POA: Diagnosis not present

## 2014-09-02 DIAGNOSIS — N186 End stage renal disease: Secondary | ICD-10-CM | POA: Diagnosis not present

## 2014-09-02 DIAGNOSIS — Z940 Kidney transplant status: Secondary | ICD-10-CM | POA: Diagnosis not present

## 2014-09-02 DIAGNOSIS — N2581 Secondary hyperparathyroidism of renal origin: Secondary | ICD-10-CM | POA: Diagnosis not present

## 2014-09-02 DIAGNOSIS — E785 Hyperlipidemia, unspecified: Secondary | ICD-10-CM | POA: Diagnosis not present

## 2014-09-11 DIAGNOSIS — Z940 Kidney transplant status: Secondary | ICD-10-CM | POA: Diagnosis not present

## 2014-10-03 DIAGNOSIS — Z940 Kidney transplant status: Secondary | ICD-10-CM | POA: Diagnosis not present

## 2014-10-09 DIAGNOSIS — N2581 Secondary hyperparathyroidism of renal origin: Secondary | ICD-10-CM | POA: Diagnosis not present

## 2014-10-09 DIAGNOSIS — Z940 Kidney transplant status: Secondary | ICD-10-CM | POA: Diagnosis not present

## 2014-10-09 DIAGNOSIS — E785 Hyperlipidemia, unspecified: Secondary | ICD-10-CM | POA: Diagnosis not present

## 2014-10-09 DIAGNOSIS — E139 Other specified diabetes mellitus without complications: Secondary | ICD-10-CM | POA: Diagnosis not present

## 2014-11-04 DIAGNOSIS — N2581 Secondary hyperparathyroidism of renal origin: Secondary | ICD-10-CM | POA: Diagnosis not present

## 2014-11-04 DIAGNOSIS — Z940 Kidney transplant status: Secondary | ICD-10-CM | POA: Diagnosis not present

## 2014-11-18 DIAGNOSIS — Z940 Kidney transplant status: Secondary | ICD-10-CM | POA: Diagnosis not present

## 2014-11-18 DIAGNOSIS — N2581 Secondary hyperparathyroidism of renal origin: Secondary | ICD-10-CM | POA: Diagnosis not present

## 2014-11-18 DIAGNOSIS — E139 Other specified diabetes mellitus without complications: Secondary | ICD-10-CM | POA: Diagnosis not present

## 2014-11-18 DIAGNOSIS — E785 Hyperlipidemia, unspecified: Secondary | ICD-10-CM | POA: Diagnosis not present

## 2014-11-18 DIAGNOSIS — Z23 Encounter for immunization: Secondary | ICD-10-CM | POA: Diagnosis not present

## 2014-12-04 DIAGNOSIS — E785 Hyperlipidemia, unspecified: Secondary | ICD-10-CM | POA: Diagnosis not present

## 2014-12-04 DIAGNOSIS — Z940 Kidney transplant status: Secondary | ICD-10-CM | POA: Diagnosis not present

## 2014-12-04 DIAGNOSIS — E139 Other specified diabetes mellitus without complications: Secondary | ICD-10-CM | POA: Diagnosis not present

## 2014-12-04 DIAGNOSIS — N2581 Secondary hyperparathyroidism of renal origin: Secondary | ICD-10-CM | POA: Diagnosis not present

## 2015-01-02 DIAGNOSIS — Z940 Kidney transplant status: Secondary | ICD-10-CM | POA: Diagnosis not present

## 2015-01-16 DIAGNOSIS — N2581 Secondary hyperparathyroidism of renal origin: Secondary | ICD-10-CM | POA: Diagnosis not present

## 2015-01-16 DIAGNOSIS — N058 Unspecified nephritic syndrome with other morphologic changes: Secondary | ICD-10-CM | POA: Diagnosis not present

## 2015-01-16 DIAGNOSIS — E669 Obesity, unspecified: Secondary | ICD-10-CM | POA: Diagnosis not present

## 2015-01-16 DIAGNOSIS — E785 Hyperlipidemia, unspecified: Secondary | ICD-10-CM | POA: Diagnosis not present

## 2015-01-16 DIAGNOSIS — R7303 Prediabetes: Secondary | ICD-10-CM | POA: Diagnosis not present

## 2015-01-16 DIAGNOSIS — Z940 Kidney transplant status: Secondary | ICD-10-CM | POA: Diagnosis not present

## 2015-02-02 DIAGNOSIS — Z940 Kidney transplant status: Secondary | ICD-10-CM | POA: Diagnosis not present

## 2015-02-12 DIAGNOSIS — N186 End stage renal disease: Secondary | ICD-10-CM | POA: Diagnosis not present

## 2015-02-12 DIAGNOSIS — Z940 Kidney transplant status: Secondary | ICD-10-CM | POA: Diagnosis not present

## 2015-02-12 DIAGNOSIS — Z7982 Long term (current) use of aspirin: Secondary | ICD-10-CM | POA: Diagnosis not present

## 2015-02-12 DIAGNOSIS — E139 Other specified diabetes mellitus without complications: Secondary | ICD-10-CM | POA: Diagnosis not present

## 2015-02-12 DIAGNOSIS — D899 Disorder involving the immune mechanism, unspecified: Secondary | ICD-10-CM | POA: Diagnosis not present

## 2015-02-12 DIAGNOSIS — E1322 Other specified diabetes mellitus with diabetic chronic kidney disease: Secondary | ICD-10-CM | POA: Diagnosis not present

## 2015-02-12 DIAGNOSIS — E213 Hyperparathyroidism, unspecified: Secondary | ICD-10-CM | POA: Diagnosis not present

## 2015-02-12 DIAGNOSIS — Z4822 Encounter for aftercare following kidney transplant: Secondary | ICD-10-CM | POA: Diagnosis not present

## 2015-02-12 DIAGNOSIS — Z992 Dependence on renal dialysis: Secondary | ICD-10-CM | POA: Diagnosis not present

## 2015-02-12 DIAGNOSIS — E785 Hyperlipidemia, unspecified: Secondary | ICD-10-CM | POA: Diagnosis not present

## 2015-02-12 DIAGNOSIS — Z79899 Other long term (current) drug therapy: Secondary | ICD-10-CM | POA: Diagnosis not present

## 2015-02-12 DIAGNOSIS — I120 Hypertensive chronic kidney disease with stage 5 chronic kidney disease or end stage renal disease: Secondary | ICD-10-CM | POA: Diagnosis not present

## 2015-02-12 DIAGNOSIS — E892 Postprocedural hypoparathyroidism: Secondary | ICD-10-CM | POA: Diagnosis not present

## 2015-03-05 DIAGNOSIS — Z940 Kidney transplant status: Secondary | ICD-10-CM | POA: Diagnosis not present

## 2015-04-06 DIAGNOSIS — Z940 Kidney transplant status: Secondary | ICD-10-CM | POA: Diagnosis not present

## 2015-04-22 DIAGNOSIS — E669 Obesity, unspecified: Secondary | ICD-10-CM | POA: Diagnosis not present

## 2015-04-22 DIAGNOSIS — R7303 Prediabetes: Secondary | ICD-10-CM | POA: Diagnosis not present

## 2015-04-22 DIAGNOSIS — E785 Hyperlipidemia, unspecified: Secondary | ICD-10-CM | POA: Diagnosis not present

## 2015-04-22 DIAGNOSIS — Z940 Kidney transplant status: Secondary | ICD-10-CM | POA: Diagnosis not present

## 2015-04-22 DIAGNOSIS — N2581 Secondary hyperparathyroidism of renal origin: Secondary | ICD-10-CM | POA: Diagnosis not present

## 2015-04-22 DIAGNOSIS — N058 Unspecified nephritic syndrome with other morphologic changes: Secondary | ICD-10-CM | POA: Diagnosis not present

## 2015-05-05 DIAGNOSIS — Z940 Kidney transplant status: Secondary | ICD-10-CM | POA: Diagnosis not present

## 2015-05-05 DIAGNOSIS — R7303 Prediabetes: Secondary | ICD-10-CM | POA: Diagnosis not present

## 2015-05-05 DIAGNOSIS — I10 Essential (primary) hypertension: Secondary | ICD-10-CM | POA: Diagnosis not present

## 2015-05-05 DIAGNOSIS — E785 Hyperlipidemia, unspecified: Secondary | ICD-10-CM | POA: Diagnosis not present

## 2015-06-01 DIAGNOSIS — M7700 Medial epicondylitis, unspecified elbow: Secondary | ICD-10-CM | POA: Diagnosis not present

## 2015-06-05 DIAGNOSIS — I10 Essential (primary) hypertension: Secondary | ICD-10-CM | POA: Diagnosis not present

## 2015-06-05 DIAGNOSIS — Z940 Kidney transplant status: Secondary | ICD-10-CM | POA: Diagnosis not present

## 2015-06-05 DIAGNOSIS — M7711 Lateral epicondylitis, right elbow: Secondary | ICD-10-CM | POA: Diagnosis not present

## 2015-06-09 DIAGNOSIS — M7711 Lateral epicondylitis, right elbow: Secondary | ICD-10-CM | POA: Diagnosis not present

## 2015-06-09 DIAGNOSIS — M25631 Stiffness of right wrist, not elsewhere classified: Secondary | ICD-10-CM | POA: Diagnosis not present

## 2015-06-16 DIAGNOSIS — M7711 Lateral epicondylitis, right elbow: Secondary | ICD-10-CM | POA: Diagnosis not present

## 2015-06-16 DIAGNOSIS — M25631 Stiffness of right wrist, not elsewhere classified: Secondary | ICD-10-CM | POA: Diagnosis not present

## 2015-06-25 DIAGNOSIS — M7711 Lateral epicondylitis, right elbow: Secondary | ICD-10-CM | POA: Diagnosis not present

## 2015-06-25 DIAGNOSIS — M25631 Stiffness of right wrist, not elsewhere classified: Secondary | ICD-10-CM | POA: Diagnosis not present

## 2015-07-01 DIAGNOSIS — M25631 Stiffness of right wrist, not elsewhere classified: Secondary | ICD-10-CM | POA: Diagnosis not present

## 2015-07-01 DIAGNOSIS — M7711 Lateral epicondylitis, right elbow: Secondary | ICD-10-CM | POA: Diagnosis not present

## 2015-07-03 DIAGNOSIS — I10 Essential (primary) hypertension: Secondary | ICD-10-CM | POA: Diagnosis not present

## 2015-07-03 DIAGNOSIS — Z940 Kidney transplant status: Secondary | ICD-10-CM | POA: Diagnosis not present

## 2015-07-06 DIAGNOSIS — N840 Polyp of corpus uteri: Secondary | ICD-10-CM | POA: Diagnosis not present

## 2015-07-06 DIAGNOSIS — N926 Irregular menstruation, unspecified: Secondary | ICD-10-CM | POA: Diagnosis not present

## 2015-07-07 ENCOUNTER — Other Ambulatory Visit: Payer: Self-pay | Admitting: Obstetrics & Gynecology

## 2015-07-07 DIAGNOSIS — N858 Other specified noninflammatory disorders of uterus: Secondary | ICD-10-CM | POA: Diagnosis not present

## 2015-07-07 DIAGNOSIS — N939 Abnormal uterine and vaginal bleeding, unspecified: Secondary | ICD-10-CM | POA: Diagnosis not present

## 2015-07-07 DIAGNOSIS — N840 Polyp of corpus uteri: Secondary | ICD-10-CM | POA: Diagnosis not present

## 2015-07-07 DIAGNOSIS — N852 Hypertrophy of uterus: Secondary | ICD-10-CM | POA: Diagnosis not present

## 2015-07-07 DIAGNOSIS — N938 Other specified abnormal uterine and vaginal bleeding: Secondary | ICD-10-CM | POA: Diagnosis not present

## 2015-07-07 DIAGNOSIS — R9341 Abnormal radiologic findings on diagnostic imaging of renal pelvis, ureter, or bladder: Secondary | ICD-10-CM | POA: Diagnosis not present

## 2015-07-14 DIAGNOSIS — N924 Excessive bleeding in the premenopausal period: Secondary | ICD-10-CM | POA: Diagnosis not present

## 2015-07-14 DIAGNOSIS — Z940 Kidney transplant status: Secondary | ICD-10-CM | POA: Diagnosis not present

## 2015-07-14 DIAGNOSIS — N058 Unspecified nephritic syndrome with other morphologic changes: Secondary | ICD-10-CM | POA: Diagnosis not present

## 2015-07-14 DIAGNOSIS — E669 Obesity, unspecified: Secondary | ICD-10-CM | POA: Diagnosis not present

## 2015-07-14 DIAGNOSIS — E785 Hyperlipidemia, unspecified: Secondary | ICD-10-CM | POA: Diagnosis not present

## 2015-07-14 DIAGNOSIS — R7303 Prediabetes: Secondary | ICD-10-CM | POA: Diagnosis not present

## 2015-07-14 DIAGNOSIS — N2581 Secondary hyperparathyroidism of renal origin: Secondary | ICD-10-CM | POA: Diagnosis not present

## 2015-07-16 DIAGNOSIS — M25631 Stiffness of right wrist, not elsewhere classified: Secondary | ICD-10-CM | POA: Diagnosis not present

## 2015-07-16 DIAGNOSIS — M7711 Lateral epicondylitis, right elbow: Secondary | ICD-10-CM | POA: Diagnosis not present

## 2015-07-20 DIAGNOSIS — M7711 Lateral epicondylitis, right elbow: Secondary | ICD-10-CM | POA: Diagnosis not present

## 2015-07-23 DIAGNOSIS — M7711 Lateral epicondylitis, right elbow: Secondary | ICD-10-CM | POA: Diagnosis not present

## 2015-07-23 DIAGNOSIS — M25631 Stiffness of right wrist, not elsewhere classified: Secondary | ICD-10-CM | POA: Diagnosis not present

## 2015-07-24 DIAGNOSIS — E139 Other specified diabetes mellitus without complications: Secondary | ICD-10-CM | POA: Diagnosis not present

## 2015-08-27 DIAGNOSIS — Z1231 Encounter for screening mammogram for malignant neoplasm of breast: Secondary | ICD-10-CM | POA: Diagnosis not present

## 2015-08-27 DIAGNOSIS — Z6827 Body mass index (BMI) 27.0-27.9, adult: Secondary | ICD-10-CM | POA: Diagnosis not present

## 2015-08-27 DIAGNOSIS — Z124 Encounter for screening for malignant neoplasm of cervix: Secondary | ICD-10-CM | POA: Diagnosis not present

## 2015-10-13 DIAGNOSIS — N924 Excessive bleeding in the premenopausal period: Secondary | ICD-10-CM | POA: Diagnosis not present

## 2015-10-13 DIAGNOSIS — R7303 Prediabetes: Secondary | ICD-10-CM | POA: Diagnosis not present

## 2015-10-13 DIAGNOSIS — N058 Unspecified nephritic syndrome with other morphologic changes: Secondary | ICD-10-CM | POA: Diagnosis not present

## 2015-10-13 DIAGNOSIS — N2581 Secondary hyperparathyroidism of renal origin: Secondary | ICD-10-CM | POA: Diagnosis not present

## 2015-10-13 DIAGNOSIS — Z940 Kidney transplant status: Secondary | ICD-10-CM | POA: Diagnosis not present

## 2015-10-13 DIAGNOSIS — E669 Obesity, unspecified: Secondary | ICD-10-CM | POA: Diagnosis not present

## 2015-10-13 DIAGNOSIS — E785 Hyperlipidemia, unspecified: Secondary | ICD-10-CM | POA: Diagnosis not present

## 2015-10-14 DIAGNOSIS — R7303 Prediabetes: Secondary | ICD-10-CM | POA: Diagnosis not present

## 2015-10-14 DIAGNOSIS — Z940 Kidney transplant status: Secondary | ICD-10-CM | POA: Diagnosis not present

## 2015-10-14 DIAGNOSIS — N2581 Secondary hyperparathyroidism of renal origin: Secondary | ICD-10-CM | POA: Diagnosis not present

## 2015-10-14 DIAGNOSIS — E785 Hyperlipidemia, unspecified: Secondary | ICD-10-CM | POA: Diagnosis not present

## 2016-01-07 DIAGNOSIS — Z940 Kidney transplant status: Secondary | ICD-10-CM | POA: Diagnosis not present

## 2016-01-07 DIAGNOSIS — E785 Hyperlipidemia, unspecified: Secondary | ICD-10-CM | POA: Diagnosis not present

## 2016-01-07 DIAGNOSIS — N058 Unspecified nephritic syndrome with other morphologic changes: Secondary | ICD-10-CM | POA: Diagnosis not present

## 2016-01-07 DIAGNOSIS — N2581 Secondary hyperparathyroidism of renal origin: Secondary | ICD-10-CM | POA: Diagnosis not present

## 2016-01-07 DIAGNOSIS — E669 Obesity, unspecified: Secondary | ICD-10-CM | POA: Diagnosis not present

## 2016-01-07 DIAGNOSIS — R7303 Prediabetes: Secondary | ICD-10-CM | POA: Diagnosis not present

## 2016-01-07 DIAGNOSIS — Z23 Encounter for immunization: Secondary | ICD-10-CM | POA: Diagnosis not present

## 2016-01-07 DIAGNOSIS — N924 Excessive bleeding in the premenopausal period: Secondary | ICD-10-CM | POA: Diagnosis not present

## 2016-01-11 DIAGNOSIS — Z940 Kidney transplant status: Secondary | ICD-10-CM | POA: Diagnosis not present

## 2016-01-25 DIAGNOSIS — L02413 Cutaneous abscess of right upper limb: Secondary | ICD-10-CM | POA: Diagnosis not present

## 2016-01-25 DIAGNOSIS — L720 Epidermal cyst: Secondary | ICD-10-CM | POA: Diagnosis not present

## 2016-01-28 DIAGNOSIS — D2239 Melanocytic nevi of other parts of face: Secondary | ICD-10-CM | POA: Diagnosis not present

## 2016-01-28 DIAGNOSIS — L821 Other seborrheic keratosis: Secondary | ICD-10-CM | POA: Diagnosis not present

## 2016-01-28 DIAGNOSIS — D485 Neoplasm of uncertain behavior of skin: Secondary | ICD-10-CM | POA: Diagnosis not present

## 2016-01-28 DIAGNOSIS — L720 Epidermal cyst: Secondary | ICD-10-CM | POA: Diagnosis not present

## 2016-01-28 DIAGNOSIS — D225 Melanocytic nevi of trunk: Secondary | ICD-10-CM | POA: Diagnosis not present

## 2016-02-15 DIAGNOSIS — I10 Essential (primary) hypertension: Secondary | ICD-10-CM | POA: Diagnosis not present

## 2016-02-15 DIAGNOSIS — E785 Hyperlipidemia, unspecified: Secondary | ICD-10-CM | POA: Diagnosis not present

## 2016-02-15 DIAGNOSIS — Z4822 Encounter for aftercare following kidney transplant: Secondary | ICD-10-CM | POA: Diagnosis not present

## 2016-02-15 DIAGNOSIS — E139 Other specified diabetes mellitus without complications: Secondary | ICD-10-CM | POA: Diagnosis not present

## 2016-02-15 DIAGNOSIS — D8989 Other specified disorders involving the immune mechanism, not elsewhere classified: Secondary | ICD-10-CM | POA: Diagnosis not present

## 2016-02-15 DIAGNOSIS — Z79899 Other long term (current) drug therapy: Secondary | ICD-10-CM | POA: Diagnosis not present

## 2016-02-15 DIAGNOSIS — Z940 Kidney transplant status: Secondary | ICD-10-CM | POA: Diagnosis not present

## 2016-03-07 DIAGNOSIS — Z940 Kidney transplant status: Secondary | ICD-10-CM | POA: Diagnosis not present

## 2016-03-10 DIAGNOSIS — Z940 Kidney transplant status: Secondary | ICD-10-CM | POA: Diagnosis not present

## 2016-05-05 DIAGNOSIS — N2581 Secondary hyperparathyroidism of renal origin: Secondary | ICD-10-CM | POA: Diagnosis not present

## 2016-05-05 DIAGNOSIS — R7303 Prediabetes: Secondary | ICD-10-CM | POA: Diagnosis not present

## 2016-05-05 DIAGNOSIS — Z6826 Body mass index (BMI) 26.0-26.9, adult: Secondary | ICD-10-CM | POA: Diagnosis not present

## 2016-05-05 DIAGNOSIS — N924 Excessive bleeding in the premenopausal period: Secondary | ICD-10-CM | POA: Diagnosis not present

## 2016-05-05 DIAGNOSIS — N058 Unspecified nephritic syndrome with other morphologic changes: Secondary | ICD-10-CM | POA: Diagnosis not present

## 2016-05-05 DIAGNOSIS — E669 Obesity, unspecified: Secondary | ICD-10-CM | POA: Diagnosis not present

## 2016-05-05 DIAGNOSIS — E785 Hyperlipidemia, unspecified: Secondary | ICD-10-CM | POA: Diagnosis not present

## 2016-05-05 DIAGNOSIS — Z940 Kidney transplant status: Secondary | ICD-10-CM | POA: Diagnosis not present

## 2016-05-09 ENCOUNTER — Other Ambulatory Visit: Payer: Self-pay | Admitting: Obstetrics & Gynecology

## 2016-05-09 DIAGNOSIS — N63 Unspecified lump in unspecified breast: Secondary | ICD-10-CM

## 2016-05-10 ENCOUNTER — Other Ambulatory Visit: Payer: Self-pay | Admitting: Obstetrics & Gynecology

## 2016-05-10 ENCOUNTER — Ambulatory Visit
Admission: RE | Admit: 2016-05-10 | Discharge: 2016-05-10 | Disposition: A | Discharge status: Home / Self Care | Payer: Medicare Other | Source: Ambulatory Visit | Attending: Obstetrics & Gynecology | Admitting: Obstetrics & Gynecology

## 2016-05-10 DIAGNOSIS — N6489 Other specified disorders of breast: Secondary | ICD-10-CM | POA: Diagnosis not present

## 2016-05-10 DIAGNOSIS — R928 Other abnormal and inconclusive findings on diagnostic imaging of breast: Secondary | ICD-10-CM

## 2016-05-10 DIAGNOSIS — N63 Unspecified lump in unspecified breast: Secondary | ICD-10-CM

## 2016-05-10 DIAGNOSIS — C50312 Malignant neoplasm of lower-inner quadrant of left female breast: Secondary | ICD-10-CM | POA: Diagnosis not present

## 2016-05-10 DIAGNOSIS — N6324 Unspecified lump in the left breast, lower inner quadrant: Secondary | ICD-10-CM | POA: Diagnosis not present

## 2016-05-10 DIAGNOSIS — N644 Mastodynia: Secondary | ICD-10-CM | POA: Diagnosis not present

## 2016-05-10 DIAGNOSIS — R922 Inconclusive mammogram: Secondary | ICD-10-CM | POA: Diagnosis not present

## 2016-05-11 ENCOUNTER — Telehealth: Payer: Self-pay | Admitting: *Deleted

## 2016-05-11 NOTE — Telephone Encounter (Signed)
Received call back from patient.  Confirmed [facility] for 05/18/16 at 815am .  Instructions and contact information given.

## 2016-05-11 NOTE — Telephone Encounter (Signed)
Left message for a return phone call to schedule for [facility].

## 2016-05-16 ENCOUNTER — Other Ambulatory Visit: Payer: Self-pay | Admitting: *Deleted

## 2016-05-16 ENCOUNTER — Encounter: Payer: Self-pay | Admitting: *Deleted

## 2016-05-16 ENCOUNTER — Encounter: Payer: Self-pay | Admitting: Genetics

## 2016-05-16 DIAGNOSIS — C50312 Malignant neoplasm of lower-inner quadrant of left female breast: Secondary | ICD-10-CM | POA: Insufficient documentation

## 2016-05-16 DIAGNOSIS — Z170 Estrogen receptor positive status [ER+]: Principal | ICD-10-CM

## 2016-05-16 DIAGNOSIS — C50919 Malignant neoplasm of unspecified site of unspecified female breast: Secondary | ICD-10-CM | POA: Insufficient documentation

## 2016-05-18 ENCOUNTER — Ambulatory Visit (HOSPITAL_BASED_OUTPATIENT_CLINIC_OR_DEPARTMENT_OTHER): Payer: Medicare Other | Admitting: Oncology

## 2016-05-18 ENCOUNTER — Other Ambulatory Visit (HOSPITAL_BASED_OUTPATIENT_CLINIC_OR_DEPARTMENT_OTHER): Payer: Medicare Other

## 2016-05-18 ENCOUNTER — Encounter: Payer: Self-pay | Admitting: *Deleted

## 2016-05-18 ENCOUNTER — Ambulatory Visit: Payer: Medicare Other | Attending: General Surgery | Admitting: Physical Therapy

## 2016-05-18 ENCOUNTER — Encounter: Payer: Self-pay | Admitting: Oncology

## 2016-05-18 ENCOUNTER — Ambulatory Visit
Admission: RE | Admit: 2016-05-18 | Discharge: 2016-05-18 | Disposition: A | Discharge status: Home / Self Care | Payer: Medicare Other | Source: Ambulatory Visit | Attending: Radiation Oncology | Admitting: Radiation Oncology

## 2016-05-18 ENCOUNTER — Encounter: Payer: Self-pay | Admitting: Physical Therapy

## 2016-05-18 VITALS — BP 127/85 | HR 83 | Temp 98.1°F | Resp 18 | Ht 65.0 in | Wt 159.8 lb

## 2016-05-18 DIAGNOSIS — Z940 Kidney transplant status: Secondary | ICD-10-CM | POA: Diagnosis not present

## 2016-05-18 DIAGNOSIS — R293 Abnormal posture: Secondary | ICD-10-CM

## 2016-05-18 DIAGNOSIS — Z170 Estrogen receptor positive status [ER+]: Secondary | ICD-10-CM | POA: Insufficient documentation

## 2016-05-18 DIAGNOSIS — C50312 Malignant neoplasm of lower-inner quadrant of left female breast: Secondary | ICD-10-CM

## 2016-05-18 DIAGNOSIS — C50512 Malignant neoplasm of lower-outer quadrant of left female breast: Secondary | ICD-10-CM | POA: Diagnosis not present

## 2016-05-18 LAB — CBC WITH DIFFERENTIAL/PLATELET
BASO%: 0.3 % (ref 0.0–2.0)
Basophils Absolute: 0 10*3/uL (ref 0.0–0.1)
EOS%: 1.5 % (ref 0.0–7.0)
Eosinophils Absolute: 0.2 10*3/uL (ref 0.0–0.5)
HEMATOCRIT: 37.8 % (ref 34.8–46.6)
HGB: 12.1 g/dL (ref 11.6–15.9)
LYMPH#: 1.9 10*3/uL (ref 0.9–3.3)
LYMPH%: 15.7 % (ref 14.0–49.7)
MCH: 25.6 pg (ref 25.1–34.0)
MCHC: 32 g/dL (ref 31.5–36.0)
MCV: 79.9 fL (ref 79.5–101.0)
MONO#: 0.6 10*3/uL (ref 0.1–0.9)
MONO%: 5.3 % (ref 0.0–14.0)
NEUT#: 9.5 10*3/uL — ABNORMAL HIGH (ref 1.5–6.5)
NEUT%: 77.2 % — AB (ref 38.4–76.8)
Platelets: 239 10*3/uL (ref 145–400)
RBC: 4.73 10*6/uL (ref 3.70–5.45)
RDW: 14.7 % — ABNORMAL HIGH (ref 11.2–14.5)
WBC: 12.3 10*3/uL — ABNORMAL HIGH (ref 3.9–10.3)

## 2016-05-18 LAB — COMPREHENSIVE METABOLIC PANEL
ALT: 16 U/L (ref 0–55)
ANION GAP: 11 meq/L (ref 3–11)
AST: 13 U/L (ref 5–34)
Albumin: 4.2 g/dL (ref 3.5–5.0)
Alkaline Phosphatase: 74 U/L (ref 40–150)
BUN: 12 mg/dL (ref 7.0–26.0)
CALCIUM: 8.5 mg/dL (ref 8.4–10.4)
CHLORIDE: 107 meq/L (ref 98–109)
CO2: 24 meq/L (ref 22–29)
Creatinine: 1.1 mg/dL (ref 0.6–1.1)
EGFR: 64 mL/min/{1.73_m2} — AB (ref >90)
Glucose: 103 mg/dl (ref 70–140)
Potassium: 3.4 mEq/L — ABNORMAL LOW (ref 3.5–5.1)
SODIUM: 143 meq/L (ref 136–145)
Total Bilirubin: 0.41 mg/dL (ref 0.20–1.20)
Total Protein: 7.4 g/dL (ref 6.4–8.3)

## 2016-05-18 NOTE — Progress Notes (Signed)
Radiation Oncology         [phone] ________________________________  Initial Outpatient Consultation  Name: Erika Freeman MRN: [id number]  Date: 05/18/2016  DOB: 02-28-1961  KD:XIPJ,A RONALD, MD  [name], MD   REFERRING PHYSICIAN: [name], MD  DIAGNOSIS: The encounter diagnosis was Malignant neoplasm of lower-inner quadrant of left breast in female, estrogen receptor positive ([place]).  HISTORY OF PRESENT ILLNESS::Erika Freeman is a 56 y.o. female who recently presented to PCP on 05/09/16 with c/o palpable left breast mass prompting mammogram. Of note, pt's last mammogram on 09/02/15 revealed no evidence of malignancy.  On 05/10/16, pt underwent mammogram revealing a left breast 6:30 o'clock palpable suspicious mass. Additionally, a 5 mm satellite nodule, 1.4 cm medial to the mass, was noted. Subsequently, pt underwent biopsy of a 2.6 cm mass in the 6:30 o'clock position of the left breast. Biopsy results indicated invasive ductal carcinoma, ER 70% positive, PR negative, Ki67 90%.   Gynecologic History Age at first menstrual period? 14 Are you still having periods? Yes Approximate date of last period? Not reported If you no longer have periods: Have you used hormone replacement? Not reported Obstetric History:  How many children have you carried to term? 3 Your age at first live birth? 60 Pregnant now or trying to get pregnant? No Have you used birth control pills or hormone shots for contraception? Yes If so, for how long (or approximate dates)? High school [phone]) Health Maintenance: Have you ever had a colonoscopy? Yes If yes, date? Not reported Have you ever had a bone density? Yes Date of your last PAP smear? 2017 Date of your FIRST mammogram? 2017   PREVIOUS RADIATION THERAPY: No  PAST MEDICAL HISTORY:  has a past medical history of Anemia; Chills; Chronic kidney disease; Cough; Fibroid tumor; Hypertension; Umbilical hernia s/p primary repair  01/10/2012 (01/24/2012); and Vomiting.    PAST SURGICAL HISTORY: Past Surgical History:  Procedure Laterality Date  . BREAST SURGERY  2012   left - fibroadenoma  . CAPD INSERTION  01/10/2012   Procedure: LAPAROSCOPIC INSERTION CONTINUOUS AMBULATORY PERITONEAL DIALYSIS  (CAPD) CATHETER;  Surgeon: Adin Hector, MD;  Location: [facility];  Service: General;  Laterality: N/A;  LAPAROSCOPIC CAPD CATHETER PLACEMENT OMENTOPEXY  . CESAREAN SECTION  1996  . KIDNEY TRANSPLANT    . PARATHYROIDECTOMY  2000 - approximate  . RENAL BIOPSY  2011  . UMBILICAL HERNIA REPAIR  01/10/2012   Procedure: HERNIA REPAIR UMBILICAL ADULT;  Surgeon: Adin Hector, MD;  Location: [facility];  Service: General;  Laterality: N/A;    FAMILY HISTORY: family history includes Cancer in her cousin, cousin, and maternal uncle.  SOCIAL HISTORY:  reports that she has never smoked. She has never used smokeless tobacco. She reports that she does not drink alcohol or use drugs.  ALLERGIES: Patient has no known allergies.  MEDICATIONS:  Current Outpatient Prescriptions  Medication Sig Dispense Refill  . amLODipine (NORVASC) 10 MG tablet Take 10 mg by mouth 2 (two) times daily.    [name] aspirin 81 MG tablet Take 81 mg by mouth daily.    Mariane Baumgarten Calcium (STOOL SOFTENER PO) Take by mouth.    . IRON PO Take 1 tablet by mouth every other day.     . lovastatin (MEVACOR) 20 MG tablet     . magnesium chloride (SLOW-MAG) 64 MG TBEC SR tablet Take 71.5 mg by mouth.    . mycophenolate (MYFORTIC) 180 MG EC tablet Take 720 mg by mouth.    [name]  SENSIPAR 60 MG tablet     . tacrolimus (PROGRAF) 1 MG capsule Take 7 mg in the am and 6 mg in the pm     No current facility-administered medications for this encounter.     REVIEW OF SYSTEMS:  A 10 point review of systems is documented in the electronic medical record. This was obtained by the nursing staff. However, I reviewed this with the patient to discuss relevant findings and make appropriate  changes.  Pertinent items are noted in HPI.   PHYSICAL EXAM:  vitals were not taken for this visit.  Vitals with BMI 05/18/2016  Height 5' 5"   Weight 159 lbs 13 oz  BMI 67.5  Systolic 916  Diastolic 85  Pulse 83  Respirations 18  Heart regular in rate and rhythm, systolic murmur noted. Lungs clear to auscultation bilaterally. No cervical, supraclavicular, or axillary lymphadenopathy appreciated. Right breast no palpable masses or discharge. Left breast scaring along inferior aspect.  The areoler border  inferiorly is surgically absent. At the 6:30 position of left breast a 3x3 cm palpable mass was noted to be mobile and not fixed to any structure.    ECOG = 1   LABORATORY DATA:  Lab Results  Component Value Date   WBC 12.3 (H) 05/18/2016   HGB 12.1 05/18/2016   HCT 37.8 05/18/2016   MCV 79.9 05/18/2016   PLT 239 05/18/2016   NEUTROABS 9.5 (H) 05/18/2016   Lab Results  Component Value Date   NA 143 05/18/2016   K 3.4 (L) 05/18/2016   CL 106 01/02/2012   CO2 24 05/18/2016   GLUCOSE 103 05/18/2016   CREATININE 1.1 05/18/2016   CALCIUM 8.5 05/18/2016      RADIOGRAPHY: US Breast Ltd Uni Left Inc Axilla  Addendum Date: 05/13/2016   ADDENDUM REPORT: 05/13/2016 12:39 ADDENDUM: Ultrasound evaluation of the left axilla demonstrates no evidence of lymphadenopathy. Electronically Signed   By: Fidela Salisbury M.D.   On: 05/13/2016 12:39   Result Date: 05/13/2016 CLINICAL DATA:  Left lower central breast area of palpable concern. Patient has a history of benign excisional biopsy of the left inferior breast demonstrating fibroadenoma. Patient has a functional transplanted kidney. EXAM: 2D DIGITAL DIAGNOSTIC LEFT MAMMOGRAM WITH CAD AND ADJUNCT TOMO ULTRASOUND LEFT BREAST COMPARISON:  Previous exam(s). ACR Breast Density Category c: The breast tissue is heterogeneously dense, which may obscure small masses. FINDINGS: Mammographically, there is a new circumscribed mass in the left lower  central breast, posterior depth, likely accounting for patient's area of palpable concern. Postsurgical architectural distortion is seen in the subareolar inferior left breast, from prior benign excisional biopsy. Mammographic images were processed with CAD. On physical exam, there is a firm fixed palpable mass in the lower slightly inner left breast, middle depth. Targeted ultrasound is performed, showing left breast 630 o'clock 3 cm from the nipple hypoechoic somewhat irregular mass measuring 1.8 x 1.5 x 2.6 cm. 1.4 cm medial to it, there is a 5 mm satellite nodule versus area of postsurgical scarring. IMPRESSION: Left breast 630 o'clock palpable suspicious mass, for which ultrasound-guided core needle biopsy is recommended. 5 mm satellite nodule versus area of postsurgical scarring, 1.4 cm medial to the mass. Even if this represents a satellite nodule, given its close proximity, it will likely be included during resection of the index mass. RECOMMENDATION: Ultrasound-guided core needle biopsy of the left breast. I have discussed the findings and recommendations with the patient. Results were also provided in writing at the conclusion of  the visit. If applicable, a reminder letter will be sent to the patient regarding the next appointment. BI-RADS CATEGORY  4: Suspicious. Electronically Signed: By: Fidela Salisbury M.D. On: 05/10/2016 15:18   Mm Diag Breast Tomo Uni Left  Addendum Date: 05/13/2016   ADDENDUM REPORT: 05/13/2016 12:39 ADDENDUM: Ultrasound evaluation of the left axilla demonstrates no evidence of lymphadenopathy. Electronically Signed   By: Fidela Salisbury M.D.   On: 05/13/2016 12:39   Result Date: 05/13/2016 CLINICAL DATA:  Left lower central breast area of palpable concern. Patient has a history of benign excisional biopsy of the left inferior breast demonstrating fibroadenoma. Patient has a functional transplanted kidney. EXAM: 2D DIGITAL DIAGNOSTIC LEFT MAMMOGRAM WITH CAD AND ADJUNCT  TOMO ULTRASOUND LEFT BREAST COMPARISON:  Previous exam(s). ACR Breast Density Category c: The breast tissue is heterogeneously dense, which may obscure small masses. FINDINGS: Mammographically, there is a new circumscribed mass in the left lower central breast, posterior depth, likely accounting for patient's area of palpable concern. Postsurgical architectural distortion is seen in the subareolar inferior left breast, from prior benign excisional biopsy. Mammographic images were processed with CAD. On physical exam, there is a firm fixed palpable mass in the lower slightly inner left breast, middle depth. Targeted ultrasound is performed, showing left breast 630 o'clock 3 cm from the nipple hypoechoic somewhat irregular mass measuring 1.8 x 1.5 x 2.6 cm. 1.4 cm medial to it, there is a 5 mm satellite nodule versus area of postsurgical scarring. IMPRESSION: Left breast 630 o'clock palpable suspicious mass, for which ultrasound-guided core needle biopsy is recommended. 5 mm satellite nodule versus area of postsurgical scarring, 1.4 cm medial to the mass. Even if this represents a satellite nodule, given its close proximity, it will likely be included during resection of the index mass. RECOMMENDATION: Ultrasound-guided core needle biopsy of the left breast. I have discussed the findings and recommendations with the patient. Results were also provided in writing at the conclusion of the visit. If applicable, a reminder letter will be sent to the patient regarding the next appointment. BI-RADS CATEGORY  4: Suspicious. Electronically Signed: By: Fidela Salisbury M.D. On: 05/10/2016 15:18   Mm Clip Placement Left  Result Date: 05/10/2016 CLINICAL DATA:  Status post ultrasound-guided core needle biopsy of a 2.6 cm mass in the 6:30 o'clock position of the left breast. EXAM: DIAGNOSTIC LEFT MAMMOGRAM POST ULTRASOUND BIOPSY COMPARISON:  Previous exam(s). FINDINGS: Mammographic images were obtained following ultrasound  guided biopsy of the recently demonstrated 2.6 cm mass in the 6:30 o'clock position of the left breast. These demonstrate a ribbon shaped biopsy marker clip within the biopsied mass. IMPRESSION: Appropriate clip deployment following left breast ultrasound-guided core needle biopsy. Final Assessment: Post Procedure Mammograms for Marker Placement Electronically Signed   By: Claudie Revering M.D.   On: 05/10/2016 16:17   [place] Lt Breast Bx W Loc Dev 1st Lesion Img Bx Spec US Guide  Addendum Date: 05/12/2016   ADDENDUM REPORT: 05/12/2016 07:14 ADDENDUM: Pathology revealed grade III invasive ductal carcinoma in the left breast. This was found to be concordant by Dr. Claudie Revering. Pathology results were discussed with the patient by telephone. The patient reported doing well after the biopsy with tenderness at the site. Post biopsy instructions and care were reviewed and questions were answered. The patient was encouraged to call [facility] for any additional concerns. The patient was referred to the [facility] at [facility] on May 18, 2016. Pathology results reported by Susa Raring RN, BSN on 05/12/2016. Electronically Signed   By: Claudie Revering M.D.   On: 05/12/2016 07:14   Result Date: 05/12/2016 CLINICAL DATA:  2.6 cm mass in the 6:30 o'clock position of the left breast with imaging features suspicious for malignancy. EXAM: ULTRASOUND GUIDED LEFT BREAST CORE NEEDLE BIOPSY COMPARISON:  Previous exam(s). FINDINGS: I met with the patient and we discussed the procedure of ultrasound-guided biopsy, including benefits and alternatives. We discussed the high likelihood of a successful procedure. We discussed the risks of the procedure, including infection, bleeding, tissue injury, clip migration, and inadequate sampling. Informed written consent was given. The usual time-out protocol was performed immediately prior to the procedure. Using  sterile technique and 1% Lidocaine as local anesthetic, under direct ultrasound visualization, a 12 gauge spring-loaded device was used to perform biopsy of the 2.6 cm mass seen in the 6:30 o'clock position of the left breast at mammography and ultrasound earlier today using a caudal approach. At the conclusion of the procedure a ribbon shaped tissue marker clip was deployed into the biopsy cavity. Follow up 2 view mammogram was performed and dictated separately. IMPRESSION: Ultrasound guided biopsy of a 2.6 cm mass in the 6:30 o'clock position of the left breast. No apparent complications. Electronically Signed: By: Claudie Revering M.D. On: 05/10/2016 16:16      IMPRESSION: Invasive ductal T2, NX,  mass 3x3 cm in greatest dimension. Proceding with surgery would likely result in suboptimal cosmetic results. Given pt's hx of prior breast surgery and likely suboptimal cosmetic result with lumpectomy, she is leaning toward mastectomy in this situation. She will see Dr. Donne Hazel later today to further make decision on surgical management. Pending results of surgery, mastectomy may require post-mastectomy radiation if lymph nodes are positive, the tumor is large, or positive surgical margins are appreciated.  PLAN: Consider biopsy of satellite mass as pt leans toward breast conservation therapy. Pt will be seen in the postop setting to determine if she is a candidate for chemotherapy, which could be complicated given pt's hx of renal transplant. She may  be seen by radiation oncology for possible post mastectomy radiation.    ------------------------------------------------  Blair Promise, PhD, MD   This document serves as a record of services personally performed by [name], MD. It was created on his behalf by Linward Natal, a trained medical scribe. The creation of this record is based on the scribe's personal observations and the provider's statements to them. This document has been checked and  approved by the attending provider.

## 2016-05-18 NOTE — Progress Notes (Signed)
Clinical Social Work [place] Psychosocial Distress Screening [place]  Patient completed distress screening protocol and scored a 3 on the Psychosocial Distress Thermometer which indicates mild distress. Clinical Social Worker met with patient and patients family in [facility] to assess for distress and other psychosocial needs. Patient stated she was feeling overwhelmed but felt "better" after meeting with the treatment team and getting more information on her treatment plan. CSW and patient discussed common feeling and emotions when being diagnosed with cancer, and the importance of support during treatment. CSW informed patient of the support team and support services at [facility], and patient was agreeable to an [employer] referral. CSW provided contact information and encouraged patient to call with any questions or concerns.  ONCBCN DISTRESS SCREENING 05/18/2016  Screening Type Initial Screening  Distress experienced in past week (1-10) 3  Emotional problem type Nervousness/Anxiety  Spiritual/Religous concerns type Relating to [place], MSW, LCSW, OSW-C Clinical Social Worker [place] [phone]

## 2016-05-18 NOTE — Therapy (Signed)
[place], [place], [address] Phone: [phone]   Fax:  [phone]  Physical Therapy Evaluation  Patient Details  Name: COURTLAND COPPA MRN: [id number] Date of Birth: 01-18-1961 Referring Provider: Dr. [name]  Encounter Date: 05/18/2016      PT End of Session - 05/18/16 1313    Visit Number 1   Number of Visits 1   PT Start Time 1019   PT Stop Time 1043   PT Time Calculation (min) 24 min   Activity Tolerance Patient tolerated treatment well   Behavior During Therapy [facility] for tasks assessed/performed      Past Medical History:  Diagnosis Date  . Anemia   . Chills   . Chronic kidney disease   . Cough   . Fibroid tumor   . Hypertension    DX  55 YRS AGO  . Umbilical hernia s/p primary repair 01/10/2012 01/24/2012  . Vomiting     Past Surgical History:  Procedure Laterality Date  . BREAST SURGERY  2012   left - fibroadenoma  . CAPD INSERTION  01/10/2012   Procedure: LAPAROSCOPIC INSERTION CONTINUOUS AMBULATORY PERITONEAL DIALYSIS  (CAPD) CATHETER;  Surgeon: Adin Hector, MD;  Location: [place];  Service: General;  Laterality: N/A;  LAPAROSCOPIC CAPD CATHETER PLACEMENT OMENTOPEXY  . CESAREAN SECTION  1996  . KIDNEY TRANSPLANT    . PARATHYROIDECTOMY  2000 - approximate  . RENAL BIOPSY  2011  . UMBILICAL HERNIA REPAIR  01/10/2012   Procedure: HERNIA REPAIR UMBILICAL ADULT;  Surgeon: Adin Hector, MD;  Location: [place];  Service: General;  Laterality: N/A;    There were no vitals filed for this visit.       Subjective Assessment - 05/18/16 1304    Subjective Patient reports she is here to be seen by her medical team for her newly diagnosed left breast cancer.   Patient is accompained by: Family member   Pertinent History Patient was diagnosed on 05/10/16 with left grade 3 invasive ductal carcinoma breast cancer. It measures 2.6 cm and is located in the lower inner  quadrant. It is ER/PR positive and HER2 negative with a Ki67 of 90%. She underwent a renal transplant in 2015.   Patient Stated Goals Reduce lymphedema risk and learn post op shoulder ROM HEP   Currently in Pain? No/denies            [facility] PT Assessment - 05/18/16 0001      Assessment   Medical Diagnosis Left breast cancer   Referring Provider Dr. [name]   Onset Date/Surgical Date 05/10/16   Hand Dominance Left   Prior Therapy none     Precautions   Precautions Other (comment)   Precaution Comments active cancer     Restrictions   Weight Bearing Restrictions No     Balance Screen   Has the patient fallen in the past 6 months No   Has the patient had a decrease in activity level because of a fear of falling?  No   Is the patient reluctant to leave their home because of a fear of falling?  No     Home Environment   Living Environment Private residence   Living Arrangements Spouse/significant other;Children  Husband and 55 y.o. son   Available Help at Discharge Family     Prior Function   Level of [place] On disability  Due to kidney transplant 2015   Leisure She walks  3-4 days per week for 45-60 minutes     Cognition   Overall Cognitive Status Within Functional Limits for tasks assessed     Posture/Postural Control   Posture/Postural Control Postural limitations   Postural Limitations Rounded Shoulders;Forward head     ROM / Strength   AROM / PROM / Strength AROM;Strength     AROM   AROM Assessment Site Shoulder;Cervical   Right/Left Shoulder Right;Left   Right Shoulder Extension 60 Degrees   Right Shoulder Flexion 148 Degrees   Right Shoulder ABduction 156 Degrees   Right Shoulder Internal Rotation 67 Degrees   Right Shoulder External Rotation 86 Degrees   Left Shoulder Extension 72 Degrees   Left Shoulder Flexion 150 Degrees   Left Shoulder ABduction 160 Degrees   Left Shoulder Internal Rotation 75 Degrees   Left  Shoulder External Rotation 88 Degrees   Cervical Flexion WNL   Cervical Extension WNL   Cervical - Right Side Bend WNL   Cervical - Left Side Bend WNL   Cervical - Right Rotation WNL   Cervical - Left Rotation WNL     Strength   Overall Strength Within functional limits for tasks performed           LYMPHEDEMA/ONCOLOGY QUESTIONNAIRE - 05/18/16 1312      Type   Cancer Type Left breast cancer     Lymphedema Assessments   Lymphedema Assessments Upper extremities     Right Upper Extremity Lymphedema   10 cm Proximal to Olecranon Process 27.5 cm   Olecranon Process 25.3 cm   10 cm Proximal to Ulnar Styloid Process 22.4 cm   Just Proximal to Ulnar Styloid Process 15.8 cm   Across Hand at [employer] 19.2 cm   At [place] of 2nd Digit 6 cm     Left Upper Extremity Lymphedema   10 cm Proximal to Olecranon Process 27.9 cm   Olecranon Process 24.2 cm   10 cm Proximal to Ulnar Styloid Process 21.8 cm   Just Proximal to Ulnar Styloid Process 15.2 cm   Across Hand at [employer] 18.3 cm   At [place] of 2nd Digit 5.7 cm      Patient was instructed today in a home exercise program today for post op shoulder range of motion. These included active assist shoulder flexion in sitting, scapular retraction, wall walking with shoulder abduction, and hands behind head external rotation.  She was encouraged to do these twice a day, holding 3 seconds and repeating 5 times when permitted by her physician.         PT Education - 05/18/16 1313    Education provided Yes   Education Details Lymphedema risk reduction and post op shoulder ROM HEP   Person(s) Educated Patient;Spouse   Methods Explanation;Demonstration;Handout   Comprehension Returned demonstration;Verbalized understanding              Breast Clinic Goals - 05/18/16 1319      Patient will be able to verbalize understanding of pertinent lymphedema risk reduction practices relevant to her diagnosis specifically related  to skin care.   Time 1   Period Days   Status Achieved     Patient will be able to return demonstrate and/or verbalize understanding of the post-op home exercise program related to regaining shoulder range of motion.   Time 1   Period Days   Status Achieved     Patient will be able to verbalize understanding of the importance of attending the postoperative After  Breast Cancer Class for further lymphedema risk reduction education and therapeutic exercise.   Time 1   Period Days   Status Achieved              Plan - 05/18/16 1314    Clinical Impression Statement Patient was diagnosed on 05/10/16 with left grade 3 invasive ductal carcinoma breast cancer. It measures 2.6 cm and is located in the lower inner quadrant. It is ER/PR positive and HER2 negative with a Ki67 of 90%. She underwent a renal transplant in 2015. Her multidisciplinary medical team met prior to her assessments to determine a recommended treatment plan. She is planning to have a left mastectomy and sentinel node biopsy followed by anti-estrogen therapy. She may benefit from post op PT to regain shoulder ROM and reduce lymphedema risk. Due to her lack of comorbidities that would impact rehab, her eval is of low complexity.   Rehab Potential Excellent   Clinical Impairments Affecting Rehab Potential none   PT Frequency One time visit   PT Treatment/Interventions Therapeutic exercise;Patient/family education   PT Next Visit Plan Will f/u after surgery to determine PT needs   PT Home Exercise Plan Post op shoulder ROM HEP   Consulted and Agree with Plan of Care Patient;Family member/caregiver   Family Member Consulted Husband      Patient will benefit from skilled therapeutic intervention in order to improve the following deficits and impairments:  Decreased range of motion, Impaired UE functional use, Postural dysfunction, Decreased knowledge of precautions  Visit Diagnosis: Carcinoma of lower-inner quadrant of left  breast in female, estrogen receptor positive ([place]) - Plan: PT plan of care cert/re-cert  Abnormal posture - Plan: PT plan of care cert/re-cert      G-Codes - 64/40/34 1320    Functional Assessment Tool Used (Outpatient Only) Clinical Judgement   Functional Limitation Other PT primary   Other PT Primary Current Status (V4259) At least 1 percent but less than 20 percent impaired, limited or restricted   Other PT Primary Goal Status (D6387) At least 1 percent but less than 20 percent impaired, limited or restricted   Other PT Primary Discharge Status (F6433) At least 1 percent but less than 20 percent impaired, limited or restricted     Patient will follow up at outpatient cancer rehab if needed following surgery.  If the patient requires physical therapy at that time, a specific plan will be dictated and sent to the referring physician for approval. The patient was educated today on appropriate basic range of motion exercises to begin post operatively and the importance of attending the After Breast Cancer class following surgery.  Patient was educated today on lymphedema risk reduction practices as it pertains to recommendations that will benefit the patient immediately following surgery.  She verbalized good understanding.  No additional physical therapy is indicated at this time.     Problem List Patient Active Problem List   Diagnosis Date Noted  . Malignant neoplasm of lower-inner quadrant of left breast in female, estrogen receptor positive ([place]) 05/16/2016  . Umbilical hernia s/p primary repair 01/10/2012 01/24/2012  . Constipation, chronic 01/24/2012  . FSGS (focal segmental glomerulosclerosis) 11/23/2011  . Uterine fibromyoma 11/23/2011  . Chronic kidney disease, stage V (very severe) ([place])   . Hypertension     Annia Friendly, [place] 05/18/16 1:23 PM  [place], [place], [address] Phone:  [phone]   Fax:  [phone]  Name: EVELINE SAUVE MRN:  [id number] Date of Birth: [birth year]

## 2016-05-18 NOTE — Progress Notes (Signed)
Nutrition Assessment  Reason for Assessment:  Pt seen in Breast Clinic  ASSESSMENT:   56 year old female with new diagnosis of left breast cancer.  Past medical history of renal transplant, HTN.  Patient reports normal appetite.  Medications:  reviewed  Labs: K 3.4  Anthropometrics:   Height: 65 inches Weight: 159 lb 12.8 oz BMI: 26.6   NUTRITION DIAGNOSIS: Food and nutrition related knowledge deficit related to new diagnosis of breast cancer as evidenced by no prior need for nutrition related information.  INTERVENTION:   Discussed and provided packet of information regarding nutritional tips for breast cancer patients.  Questions answered.  Teachback method used.  Contact information provided and patient knows to contact me with questions/concerns.    MONITORING, EVALUATION, and GOAL: Pt will consume a healthy plant based diet to maintain lean body mass throughout treatment.   Jamaiya Tunnell B. Zenia Resides, [place], [place] Registered Dietitian [phone] (pager)

## 2016-05-18 NOTE — Patient Instructions (Signed)

## 2016-05-19 ENCOUNTER — Other Ambulatory Visit: Payer: Self-pay | Admitting: General Surgery

## 2016-05-19 DIAGNOSIS — Z7982 Long term (current) use of aspirin: Secondary | ICD-10-CM | POA: Diagnosis not present

## 2016-05-19 DIAGNOSIS — D8989 Other specified disorders involving the immune mechanism, not elsewhere classified: Secondary | ICD-10-CM | POA: Diagnosis not present

## 2016-05-19 DIAGNOSIS — I159 Secondary hypertension, unspecified: Secondary | ICD-10-CM | POA: Diagnosis not present

## 2016-05-19 DIAGNOSIS — Z4822 Encounter for aftercare following kidney transplant: Secondary | ICD-10-CM | POA: Diagnosis not present

## 2016-05-19 DIAGNOSIS — N186 End stage renal disease: Secondary | ICD-10-CM | POA: Diagnosis not present

## 2016-05-19 DIAGNOSIS — Z992 Dependence on renal dialysis: Secondary | ICD-10-CM | POA: Diagnosis not present

## 2016-05-19 DIAGNOSIS — Z79899 Other long term (current) drug therapy: Secondary | ICD-10-CM | POA: Diagnosis not present

## 2016-05-19 DIAGNOSIS — C50919 Malignant neoplasm of unspecified site of unspecified female breast: Secondary | ICD-10-CM | POA: Diagnosis not present

## 2016-05-19 DIAGNOSIS — C50912 Malignant neoplasm of unspecified site of left female breast: Secondary | ICD-10-CM | POA: Diagnosis not present

## 2016-05-19 DIAGNOSIS — Z940 Kidney transplant status: Secondary | ICD-10-CM | POA: Diagnosis not present

## 2016-05-19 DIAGNOSIS — I120 Hypertensive chronic kidney disease with stage 5 chronic kidney disease or end stage renal disease: Secondary | ICD-10-CM | POA: Diagnosis not present

## 2016-05-19 NOTE — Progress Notes (Signed)
START ON PATHWAY REGIMEN - Breast   Dose-Dense AC q14 days:   A cycle is every 14 days:     Doxorubicin      Cyclophosphamide      Pegfilgrastim   **Always confirm dose/schedule in your pharmacy ordering system**    Paclitaxel 80 mg/m2 Weekly:   Administer weekly:     Paclitaxel   **Always confirm dose/schedule in your pharmacy ordering system**    Patient Characteristics: Neoadjuvant Chemotherapy, HER2/neu Negative/Unknown/Equivocal, ER Positive AJCC Stage Grouping: IIA Current Disease Status: No Distant Mets or Local Recurrence AJCC M Stage: 0 ER Status: Positive (+) AJCC N Stage: 0 AJCC T Stage: 2 HER2/neu: Negative (-) PR Status: Negative (-)  Intent of Therapy: Curative Intent, Discussed with Patient

## 2016-05-19 NOTE — Progress Notes (Signed)
[facility]  [phone] Fax:([phone]     ID: LEVONIA WOLFLEY DOB: 03/18/1960  MR#: [id number]  GUR#:[id number]  Patient Care Team: [name], MD as PCP - General (Obstetrics and Gynecology) [name], MD as Consulting Physician (Nephrology) [name], MD as Consulting Physician (Obstetrics and Gynecology) [name], MD as Consulting Physician (General Surgery) [name], MD as Consulting Physician (Oncology) [name], MD as Consulting Physician (Radiation Oncology) [name], MD as Referring Physician (Surgery) [name], DO (Internal Medicine) [name], MD OTHER MD:  CHIEF COMPLAINT: Estrogen receptor positive breast cancer  CURRENT TREATMENT: Neoadjuvant chemotherapy   BREAST CANCER HISTORY: "[name]" herself noted a mass in her left breast. She ignored it because she thought it was related to menstruation. However as it persisted through 1. She brought it to Dr. [name] attention and on 05/10/2016 she underwent left diagnostic mammography with tomography and [facility] The breast density was category C. In the left breast lower central area there was a new circumscribed mass. This was firm and fixed in the lower inner left breast at middle depth. Ultrasonography confirmed a 2.6 cm hypoechoic irregular mass at the 6:30 o'clock radiant 3 cm from the nipple. There was also a 0.5 cm nodule 1.4 cm medial to the mass just described.  Biopsy of the left breast mass in question 05/10/2016 showed([id number]) invasive ductal carcinoma, grade 3, estrogen receptor 70% positive with weak staining intensity, progesterone receptor negative, with no HER-2 amplification, the signals ratio being 1.52 and the number per cell 2.35. The proliferation marker was 90%.  Her subsequent history is as detailed below  OF NOTE: The patient has a history of focal segmental glomerular  sclerosis with end-stage renal disease and is status post unrelated donor renal transplant 04/15/2013 as part of appeared donor exchange with her husband donating one kidney, the other harvested from a 55 year old [place] man. She continues on immunosuppression with mycophenolate and tacrolimus.  INTERVAL HISTORY: The patient was evaluated in the multidisciplinary breast cancer clinic 05/18/2016 accompanied by her husband white and her son [name]. Her case was also presented in the multidisciplinary breast cancer conference that same morning. At that time a preliminary plan was proposed: Consideration of biopsy of the satellite lesion if breast conserving surgery was planned, consideration of neoadjuvant treatment to make breast conservation possible. The patient would benefit from chemotherapy, radiation is appropriate, and anti-estrogens.  REVIEW OF SYSTEMS: Aside from the mass itself therewere no specific symptoms leading to the original mammogram, which was routinely scheduled. The patient denies unusual headaches, visual changes, nausea, vomiting, stiff neck, dizziness, or gait imbalance. There has been no cough, phlegm production, or pleurisy, no chest pain or pressure, and no change in bowel or bladder habits. The patient denies fever, rash, bleeding, unexplained fatigue or unexplained weight loss. A detailed review of systems was otherwise entirely negative.     ,  PAST MEDICAL HISTORY: Past Medical History:  Diagnosis Date  . Anemia   . Chills   . Chronic kidney disease   . Cough   . Fibroid tumor   . Hypertension    DX  30 YRS AGO  . Umbilical hernia s/p primary repair 01/10/2012 01/24/2012  . Vomiting     PAST SURGICAL HISTORY: Past Surgical History:  Procedure Laterality Date  . BREAST SURGERY  2012   left - fibroadenoma  . CAPD INSERTION  01/10/2012   Procedure: LAPAROSCOPIC INSERTION CONTINUOUS  AMBULATORY PERITONEAL DIALYSIS  (CAPD) CATHETER;  Surgeon: Adin Hector, MD;   Location: [facility];  Service: General;  Laterality: N/A;  LAPAROSCOPIC CAPD CATHETER PLACEMENT OMENTOPEXY  . CESAREAN SECTION  1996  . KIDNEY TRANSPLANT    . PARATHYROIDECTOMY  2000 - approximate  . RENAL BIOPSY  2011  . UMBILICAL HERNIA REPAIR  01/10/2012   Procedure: HERNIA REPAIR UMBILICAL ADULT;  Surgeon: Adin Hector, MD;  Location: [facility];  Service: General;  Laterality: N/A;    FAMILY HISTORY Family History  Problem Relation Age of Onset  . Cancer Maternal Uncle     lung  . Cancer Cousin     breast  . Cancer Cousin     breast  The patient has very little information regarding her father. Her mother is living, 22 years old as of March 2018. The patient had one full brother, diagnosed with prostate cancer in his 30s. The patient has 2 half-brothers and 1 half-sister. There is no history of breast or ovarian cancer in the family to the patient's knowledge   GYNECOLOGIC HISTORY:  No LMP recorded.  Menarche age 28, first live birth age 39, the patient is GX P3. Her periods are regular, the last 5 or 6 days, with no heavy days. 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 Her husband white works at home [employer]. Son [name] lives in [place] and as Freight forwarder of a Therapist, art. Son [name] lives in [place] and is a Air traffic controller. Son [name] lives in [place] and is studying based trombone. The patient has 2 grandchildren. She attends a local [employer]:  not in place    HEALTH MAINTENANCE: Social History  Substance Use Topics  . Smoking status: Never Smoker  . Smokeless tobacco: Never Used  . Alcohol use No     Colonoscopy:  PAP:  Bone density:   No Known Allergies  Current Outpatient Prescriptions  Medication Sig Dispense Refill  . amLODipine (NORVASC) 10 MG tablet Take 10 mg by mouth 2 (two) times daily.      [name] aspirin 81 MG tablet Take 81 mg by mouth daily.    Mariane Baumgarten Calcium (STOOL SOFTENER PO) Take by mouth.    . IRON PO Take 1 tablet by mouth every other day.     . lovastatin (MEVACOR) 20 MG tablet     . magnesium chloride (SLOW-MAG) 64 MG TBEC SR tablet Take 71.5 mg by mouth.    . mycophenolate (MYFORTIC) 180 MG EC tablet Take 720 mg by mouth.    . SENSIPAR 60 MG tablet     . tacrolimus (PROGRAF) 1 MG capsule Take 7 mg in the am and 6 mg in the pm     No current facility-administered medications for this visit.     OBJECTIVE: Middle-aged African-American woman in no acute distress  Vitals:   05/18/16 0848  BP: 127/85  Pulse: 83  Resp: 18  Temp: 98.1 F (36.7 C)     Body mass index is 26.59 kg/m.    ECOG FS:1 - Symptomatic but completely ambulatory  Ocular: Sclerae unicteric, pupils equal, round and reactive to light Ear-nose-throat: Oropharynx clear and moist Lymphatic: No cervical or supraclavicular adenopathy Lungs no rales or rhonchi, good excursion bilaterally Heart regular rate and rhythm, no murmur appreciated Abd soft, nontender, positive bowel sounds MSK no focal  spinal tenderness, no joint edema Neuro: non-focal, well-oriented, appropriate affect Breasts:  the right breast is unremarkable. In the inferior right breast, just about at the in the ureter mammary fold, there is an easily palpable mass measuring approximately 2 cm. It appears fixed. It is not erythematous or tender. There are no skin or nipple changes of concern otherwise. Both axillae are benign.    LAB RESULTS:  CMP     Component Value Date/Time   NA 143 05/18/2016 0827   K 3.4 (L) 05/18/2016 0827   CL 106 01/02/2012 0947   CO2 24 05/18/2016 0827   GLUCOSE 103 05/18/2016 0827   BUN 12.0 05/18/2016 0827   CREATININE 1.1 05/18/2016 0827   CALCIUM 8.5 05/18/2016 0827   PROT 7.4 05/18/2016 0827   ALBUMIN 4.2 05/18/2016 0827   AST 13 05/18/2016 0827   ALT 16 05/18/2016 0827   ALKPHOS 74  05/18/2016 0827   BILITOT 0.41 05/18/2016 0827   GFRNONAA 7 (L) 01/02/2012 0947   GFRAA 8 (L) 01/02/2012 0947    No results found for: TOTALPROTELP, ALBUMINELP, A1GS, A2GS, BETS, BETA2SER, GAMS, MSPIKE, SPEI  No results found for: Nils Pyle, [facility]  Lab Results  Component Value Date   WBC 12.3 (H) 05/18/2016   NEUTROABS 9.5 (H) 05/18/2016   HGB 12.1 05/18/2016   HCT 37.8 05/18/2016   MCV 79.9 05/18/2016   PLT 239 05/18/2016      Chemistry      Component Value Date/Time   NA 143 05/18/2016 0827   K 3.4 (L) 05/18/2016 0827   CL 106 01/02/2012 0947   CO2 24 05/18/2016 0827   BUN 12.0 05/18/2016 0827   CREATININE 1.1 05/18/2016 0827      Component Value Date/Time   CALCIUM 8.5 05/18/2016 0827   ALKPHOS 74 05/18/2016 0827   AST 13 05/18/2016 0827   ALT 16 05/18/2016 0827   BILITOT 0.41 05/18/2016 0827       No results found for: LABCA2  No components found for: [id number]  No results for input(s): INR in the last 168 hours.  Urinalysis    Component Value Date/Time   COLORURINE YELLOW 11/24/2006 [place] 11/24/2006 1539   LABSPEC 1.018 11/24/2006 1539   PHURINE 6.0 11/24/2006 1539   GLUCOSEU NEGATIVE 11/24/2006 1539   HGBUR SMALL (A) 11/24/2006 1539   BILIRUBINUR NEGATIVE 11/24/2006 1539   KETONESUR NEGATIVE 11/24/2006 1539   PROTEINUR 100 (A) 11/24/2006 1539   UROBILINOGEN 1.0 11/24/2006 1539   NITRITE NEGATIVE 11/24/2006 1539   LEUKOCYTESUR MODERATE (A) 11/24/2006 1539     STUDIES: US Breast Ltd Uni Left Inc Axilla  Addendum Date: 05/13/2016   ADDENDUM REPORT: 05/13/2016 12:39 ADDENDUM: Ultrasound evaluation of the left axilla demonstrates no evidence of lymphadenopathy. Electronically Signed   By: Fidela Salisbury M.D.   On: 05/13/2016 12:39   Result Date: 05/13/2016 CLINICAL DATA:  Left lower central breast area of palpable concern. Patient has a history of benign excisional biopsy of the left inferior breast  demonstrating fibroadenoma. Patient has a functional transplanted kidney. EXAM: 2D DIGITAL DIAGNOSTIC LEFT MAMMOGRAM WITH CAD AND ADJUNCT TOMO ULTRASOUND LEFT BREAST COMPARISON:  Previous exam(s). ACR Breast Density Category c: The breast tissue is heterogeneously dense, which may obscure small masses. FINDINGS: Mammographically, there is a new circumscribed mass in the left lower central breast, posterior depth, likely accounting for patient's area of palpable concern. Postsurgical architectural distortion is seen in the subareolar inferior left breast, from prior benign excisional  biopsy. Mammographic images were processed with CAD. On physical exam, there is a firm fixed palpable mass in the lower slightly inner left breast, middle depth. Targeted ultrasound is performed, showing left breast 630 o'clock 3 cm from the nipple hypoechoic somewhat irregular mass measuring 1.8 x 1.5 x 2.6 cm. 1.4 cm medial to it, there is a 5 mm satellite nodule versus area of postsurgical scarring. IMPRESSION: Left breast 630 o'clock palpable suspicious mass, for which ultrasound-guided core needle biopsy is recommended. 5 mm satellite nodule versus area of postsurgical scarring, 1.4 cm medial to the mass. Even if this represents a satellite nodule, given its close proximity, it will likely be included during resection of the index mass. RECOMMENDATION: Ultrasound-guided core needle biopsy of the left breast. I have discussed the findings and recommendations with the patient. Results were also provided in writing at the conclusion of the visit. If applicable, a reminder letter will be sent to the patient regarding the next appointment. BI-RADS CATEGORY  4: Suspicious. Electronically Signed: By: Fidela Salisbury M.D. On: 05/10/2016 15:18   Mm Diag Breast Tomo Uni Left  Addendum Date: 05/13/2016   ADDENDUM REPORT: 05/13/2016 12:39 ADDENDUM: Ultrasound evaluation of the left axilla demonstrates no evidence of lymphadenopathy.  Electronically Signed   By: Fidela Salisbury M.D.   On: 05/13/2016 12:39   Result Date: 05/13/2016 CLINICAL DATA:  Left lower central breast area of palpable concern. Patient has a history of benign excisional biopsy of the left inferior breast demonstrating fibroadenoma. Patient has a functional transplanted kidney. EXAM: 2D DIGITAL DIAGNOSTIC LEFT MAMMOGRAM WITH CAD AND ADJUNCT TOMO ULTRASOUND LEFT BREAST COMPARISON:  Previous exam(s). ACR Breast Density Category c: The breast tissue is heterogeneously dense, which may obscure small masses. FINDINGS: Mammographically, there is a new circumscribed mass in the left lower central breast, posterior depth, likely accounting for patient's area of palpable concern. Postsurgical architectural distortion is seen in the subareolar inferior left breast, from prior benign excisional biopsy. Mammographic images were processed with CAD. On physical exam, there is a firm fixed palpable mass in the lower slightly inner left breast, middle depth. Targeted ultrasound is performed, showing left breast 630 o'clock 3 cm from the nipple hypoechoic somewhat irregular mass measuring 1.8 x 1.5 x 2.6 cm. 1.4 cm medial to it, there is a 5 mm satellite nodule versus area of postsurgical scarring. IMPRESSION: Left breast 630 o'clock palpable suspicious mass, for which ultrasound-guided core needle biopsy is recommended. 5 mm satellite nodule versus area of postsurgical scarring, 1.4 cm medial to the mass. Even if this represents a satellite nodule, given its close proximity, it will likely be included during resection of the index mass. RECOMMENDATION: Ultrasound-guided core needle biopsy of the left breast. I have discussed the findings and recommendations with the patient. Results were also provided in writing at the conclusion of the visit. If applicable, a reminder letter will be sent to the patient regarding the next appointment. BI-RADS CATEGORY  4: Suspicious. Electronically  Signed: By: Fidela Salisbury M.D. On: 05/10/2016 15:18   Mm Clip Placement Left  Result Date: 05/10/2016 CLINICAL DATA:  Status post ultrasound-guided core needle biopsy of a 2.6 cm mass in the 6:30 o'clock position of the left breast. EXAM: DIAGNOSTIC LEFT MAMMOGRAM POST ULTRASOUND BIOPSY COMPARISON:  Previous exam(s). FINDINGS: Mammographic images were obtained following ultrasound guided biopsy of the recently demonstrated 2.6 cm mass in the 6:30 o'clock position of the left breast. These demonstrate a ribbon shaped biopsy marker clip within the biopsied  mass. IMPRESSION: Appropriate clip deployment following left breast ultrasound-guided core needle biopsy. Final Assessment: Post Procedure Mammograms for Marker Placement Electronically Signed   By: Claudie Revering M.D.   On: 05/10/2016 16:17   [place] Lt Breast Bx W Loc Dev 1st Lesion Img Bx Spec US Guide  Addendum Date: 05/12/2016   ADDENDUM REPORT: 05/12/2016 07:14 ADDENDUM: Pathology revealed grade III invasive ductal carcinoma in the left breast. This was found to be concordant by Dr. Claudie Revering. Pathology results were discussed with the patient by telephone. The patient reported doing well after the biopsy with tenderness at the site. Post biopsy instructions and care were reviewed and questions were answered. The patient was encouraged to call [facility] for any additional concerns. The patient was referred to the [facility] at [facility] on [facility] 18[facility]. Pathology results reported by Susa Raring RN, BSN on 05/12/2016. Electronically Signed   By: Claudie Revering M.D.   On: 05/12/2016 07:14   Result Date: 05/12/2016 CLINICAL DATA:  2.6 cm mass in the 6:30 o'clock position of the left breast with imaging features suspicious for malignancy. EXAM: ULTRASOUND GUIDED LEFT BREAST CORE NEEDLE BIOPSY COMPARISON:  Previous exam(s). FINDINGS: I met with the patient and we  discussed the procedure of ultrasound-guided biopsy, including benefits and alternatives. We discussed the high likelihood of a successful procedure. We discussed the risks of the procedure, including infection, bleeding, tissue injury, clip migration, and inadequate sampling. Informed written consent was given. The usual time-out protocol was performed immediately prior to the procedure. Using sterile technique and 1% Lidocaine as local anesthetic, under direct ultrasound visualization, a 12 gauge spring-loaded device was used to perform biopsy of the 2.6 cm mass seen in the 6:30 o'clock position of the left breast at mammography and ultrasound earlier today using a caudal approach. At the conclusion of the procedure a ribbon shaped tissue marker clip was deployed into the biopsy cavity. Follow up 2 view mammogram was performed and dictated separately. IMPRESSION: Ultrasound guided biopsy of a 2.6 cm mass in the 6:30 o'clock position of the left breast. No apparent complications. Electronically Signed: By: Claudie Revering M.D. On: 05/10/2016 16:16    ELIGIBLE FOR AVAILABLE RESEARCH PROTOCOL: no  ASSESSMENT: 56 y.o. [place], [place] woman status post left breast lower inner quadrant biopsy 05/10/2016 for a clinical  T2 N0, prognostic stage IIB invaside ductal carcinoma, grade 3, weekly estrogen receptor positive, progesterone receptor negative, with no HER-2 amplification, and the MIB-1 at 90%  (1) neoadjuvant chemotherapy will consist of doxorubicin and cyclophosphamide in dose dense fashion 4 to start 05/31/2016 followed by Abraxane weekly 12  (2) definitive surgery to follow, with breast conservation preferred if feasible   (a) biopsy of a second lesion in the left breast pending  (3) adjuvant radiation as appropriate  (4) anti-estrogens to follow the completion of local treatment  PLAN: We spent the better part of today's hour-long appointment discussing the biology of breast cancer in general, and the  specifics of the patient's tumor in particular. We discussed the difference between local and systemic therapy. In terms of loco-regional treatment, lumpectomy plus radiation is equivalent to mastectomy as far as survival is concerned. For this reason, and because the cosmetic results are generally superior, we recommend breast conserving surgery if possible. We also noted that in terms of sequencing of treatments, whether systemic therapy or surgery is done first does not affect the ultimate outcome.  We then discussed the rationale for systemic therapy. There is some risk that this cancer may have already spread to other parts of her body. Patients frequently ask at this point about bone scans, CAT scans and PET scans to find out if they have occult breast cancer somewhere else. The problem is that in early stage disease we are much more likely to find false positives then true cancers and this would expose the patient to unnecessary procedures as well as unnecessary radiation. Scans cannot answer the question the patient really would like to know, which is whether she has microscopic disease elsewhere in her body. For those reasons we do not recommend them.  Of course we would proceed to aggressive evaluation of any symptoms that might suggest metastatic disease, but that is not the case here.  Next we went over the options for systemic therapy which are anti-estrogens, anti-HER-2 immunotherapy, and chemotherapy. [name] does not meet criteria for anti-HER-2 immunotherapy. She is a candidate for anti-estrogens, but given the fact that her cancer is only weakly estrogen receptor positive and is progesterone receptor negative, she cannot be expected to receive the level of benefit of a patient who is strongly positive in both these receptors.  For that reason we are recommending chemotherapy. Chemotherapy is most effective in rapidly growing, aggressive tumors like [name] 's. The plan would be for her to  receive the current optimal standard of therapy, which consisted of cyclophosphamide and doxorubicin in dose dense fashion followed by taxanes, in her case most likely Abraxane, weekly for 12 weeks.  We discussed the possible toxicities, side effects and complications of these agents in some detail today but she will return to "chemotherapy school" to learn more about her chemotherapy and to receive it to her of the treatment area. She will need an echocardiogram, which has been ordered. She will benefit from having a port placed and I have discussed this with her surgeon Dr. Donne Hazel.  Finally I have discussed her situation with her nephrologist Dr. Billey Chang and her transplant surgeon Dr.Farney and we will be working closely together so that [name]'s immune suppression can be modulated appropriately, the goals of course being safety to her renal transplants, no intercurrent infections, and ability to receive full doses of chemotherapy at standard intervals   We will forego the usual breast MRI in this case and follow treatment response with breast ultrasonography. [name] understands we cannot guarantee that she will be able to keep her breast by doing the chemotherapy first, but certainly if she did not receive neoadjuvant chemotherapy they would be no real choice other than mastectomy   [name] has a good understanding of the overall plan. She agrees with it. She knows the goal of treatment in her case is cure. She will call with any problems that may develop before her next visit here.  [name], MD   05/19/2016 3:32 PM Medical Oncology and Hematology [facility] [address] [place], [place] [address] Tel. [phone]    Fax. [phone]

## 2016-05-20 ENCOUNTER — Telehealth: Payer: Self-pay | Admitting: *Deleted

## 2016-05-20 ENCOUNTER — Telehealth: Payer: Self-pay | Admitting: Oncology

## 2016-05-20 ENCOUNTER — Other Ambulatory Visit: Payer: Self-pay | Admitting: *Deleted

## 2016-05-20 DIAGNOSIS — C50312 Malignant neoplasm of lower-inner quadrant of left female breast: Secondary | ICD-10-CM

## 2016-05-20 DIAGNOSIS — Z170 Estrogen receptor positive status [ER+]: Principal | ICD-10-CM

## 2016-05-20 NOTE — Telephone Encounter (Signed)
Chemo education class scheduled per Humberto Leep. Patient is aware of date and time.

## 2016-05-20 NOTE — Telephone Encounter (Signed)
swpt to confirm 3/30 appt at 2 pm per LOS

## 2016-05-20 NOTE — Telephone Encounter (Signed)
Received call from [place] at [facility] transplant.  She states they need to know what the patient is scheduled for to be able to adjust her immunosupressive medications.  She thought there was some confusion on the patient's part about neo vs adjuvant treatment.  Erika Freeman needs to be made aware when she has about a week left of treatment so they can get meds adjusted accordingly.  Her direct # is [phone].    Spoke with patient and she is clear about having neoadjuvant chemo.  The plan is to start on 4/4. Informed her we may have to get IR to place her port. Sheh verbalized understanding.

## 2016-05-23 ENCOUNTER — Encounter (HOSPITAL_COMMUNITY): Payer: Self-pay | Admitting: *Deleted

## 2016-05-23 ENCOUNTER — Other Ambulatory Visit: Payer: Self-pay | Admitting: *Deleted

## 2016-05-23 NOTE — Progress Notes (Signed)
Pt denies having an EKG and chest x ray within the last year.

## 2016-05-23 NOTE — Progress Notes (Signed)
Pt denies SOB, chest pain, and being under the care of a cardiologist. Pt denies having a cardiac cath. Pt made aware to stop taking Aspirin,vitamins, fish oil and herbal medications. Do not take any NSAIDs ie: Ibuprofen, Advil, Naproxen, BC and Goody Powder or any medication containing Aspirin. Pt verbalized understanding of all pre-op instructions.

## 2016-05-24 ENCOUNTER — Ambulatory Visit (HOSPITAL_COMMUNITY)
Admission: RE | Admit: 2016-05-24 | Discharge: 2016-05-24 | Disposition: A | Discharge status: Home / Self Care | Payer: Medicare Other | Source: Ambulatory Visit | Attending: General Surgery | Admitting: General Surgery

## 2016-05-24 ENCOUNTER — Ambulatory Visit (HOSPITAL_COMMUNITY): Payer: Medicare Other | Admitting: Anesthesiology

## 2016-05-24 ENCOUNTER — Ambulatory Visit (HOSPITAL_COMMUNITY): Payer: Medicare Other

## 2016-05-24 ENCOUNTER — Encounter (HOSPITAL_COMMUNITY): Payer: Self-pay | Admitting: Anesthesiology

## 2016-05-24 ENCOUNTER — Encounter (HOSPITAL_COMMUNITY)
Admission: RE | Disposition: A | Discharge status: Home / Self Care | Payer: Self-pay | Source: Ambulatory Visit | Attending: General Surgery

## 2016-05-24 DIAGNOSIS — Z452 Encounter for adjustment and management of vascular access device: Secondary | ICD-10-CM | POA: Diagnosis not present

## 2016-05-24 DIAGNOSIS — C50512 Malignant neoplasm of lower-outer quadrant of left female breast: Secondary | ICD-10-CM | POA: Diagnosis not present

## 2016-05-24 DIAGNOSIS — C50911 Malignant neoplasm of unspecified site of right female breast: Secondary | ICD-10-CM | POA: Diagnosis not present

## 2016-05-24 DIAGNOSIS — Z419 Encounter for procedure for purposes other than remedying health state, unspecified: Secondary | ICD-10-CM

## 2016-05-24 DIAGNOSIS — Z8042 Family history of malignant neoplasm of prostate: Secondary | ICD-10-CM | POA: Diagnosis not present

## 2016-05-24 DIAGNOSIS — Z170 Estrogen receptor positive status [ER+]: Secondary | ICD-10-CM | POA: Insufficient documentation

## 2016-05-24 DIAGNOSIS — E1122 Type 2 diabetes mellitus with diabetic chronic kidney disease: Secondary | ICD-10-CM | POA: Diagnosis not present

## 2016-05-24 DIAGNOSIS — Z95828 Presence of other vascular implants and grafts: Secondary | ICD-10-CM

## 2016-05-24 DIAGNOSIS — D631 Anemia in chronic kidney disease: Secondary | ICD-10-CM | POA: Insufficient documentation

## 2016-05-24 DIAGNOSIS — I129 Hypertensive chronic kidney disease with stage 1 through stage 4 chronic kidney disease, or unspecified chronic kidney disease: Secondary | ICD-10-CM | POA: Diagnosis not present

## 2016-05-24 DIAGNOSIS — N189 Chronic kidney disease, unspecified: Secondary | ICD-10-CM | POA: Insufficient documentation

## 2016-05-24 DIAGNOSIS — E7800 Pure hypercholesterolemia, unspecified: Secondary | ICD-10-CM | POA: Insufficient documentation

## 2016-05-24 DIAGNOSIS — Z940 Kidney transplant status: Secondary | ICD-10-CM | POA: Diagnosis not present

## 2016-05-24 DIAGNOSIS — K219 Gastro-esophageal reflux disease without esophagitis: Secondary | ICD-10-CM | POA: Insufficient documentation

## 2016-05-24 DIAGNOSIS — Z8249 Family history of ischemic heart disease and other diseases of the circulatory system: Secondary | ICD-10-CM | POA: Insufficient documentation

## 2016-05-24 DIAGNOSIS — N185 Chronic kidney disease, stage 5: Secondary | ICD-10-CM | POA: Diagnosis not present

## 2016-05-24 DIAGNOSIS — Z833 Family history of diabetes mellitus: Secondary | ICD-10-CM | POA: Insufficient documentation

## 2016-05-24 DIAGNOSIS — C50912 Malignant neoplasm of unspecified site of left female breast: Secondary | ICD-10-CM | POA: Insufficient documentation

## 2016-05-24 DIAGNOSIS — Z823 Family history of stroke: Secondary | ICD-10-CM | POA: Insufficient documentation

## 2016-05-24 HISTORY — DX: Gastro-esophageal reflux disease without esophagitis: K21.9

## 2016-05-24 HISTORY — DX: Malignant (primary) neoplasm, unspecified: C80.1

## 2016-05-24 HISTORY — PX: PORTACATH PLACEMENT: SHX2246

## 2016-05-24 HISTORY — DX: Cardiac murmur, unspecified: R01.1

## 2016-05-24 LAB — BASIC METABOLIC PANEL
ANION GAP: 11 (ref 5–15)
BUN: 11 mg/dL (ref 6–20)
CALCIUM: 8.3 mg/dL — AB (ref 8.9–10.3)
CO2: 23 mmol/L (ref 22–32)
Chloride: 108 mmol/L (ref 101–111)
Creatinine, Ser: 0.99 mg/dL (ref 0.44–1.00)
GFR calc non Af Amer: >60 mL/min (ref >60)
GLUCOSE: 94 mg/dL (ref 65–99)
Potassium: 3.3 mmol/L — ABNORMAL LOW (ref 3.5–5.1)
Sodium: 142 mmol/L (ref 135–145)

## 2016-05-24 LAB — CBC
HEMATOCRIT: 36.8 % (ref 36.0–46.0)
Hemoglobin: 11.4 g/dL — ABNORMAL LOW (ref 12.0–15.0)
MCH: 25.5 pg — ABNORMAL LOW (ref 26.0–34.0)
MCHC: 31 g/dL (ref 30.0–36.0)
MCV: 82.3 fL (ref 78.0–100.0)
PLATELETS: 222 10*3/uL (ref 150–400)
RBC: 4.47 MIL/uL (ref 3.87–5.11)
RDW: 14.3 % (ref 11.5–15.5)
WBC: 10.4 10*3/uL (ref 4.0–10.5)

## 2016-05-24 LAB — HCG, SERUM, QUALITATIVE: Preg, Serum: NEGATIVE

## 2016-05-24 SURGERY — INSERTION, TUNNELED CENTRAL VENOUS DEVICE, WITH PORT
Anesthesia: General | Site: Chest | Laterality: Right

## 2016-05-24 MED ORDER — FENTANYL CITRATE (PF) 100 MCG/2ML IJ SOLN
INTRAMUSCULAR | Status: DC | PRN
  Administered 2016-05-24 (×2): 50 ug via INTRAVENOUS

## 2016-05-24 MED ORDER — HEPARIN SOD (PORK) LOCK FLUSH 100 UNIT/ML IV SOLN
INTRAVENOUS | Status: DC | PRN
  Administered 2016-05-24: 500 [IU]

## 2016-05-24 MED ORDER — LACTATED RINGERS IV SOLN
INTRAVENOUS | Status: DC
  Administered 2016-05-24: 50 mL/h via INTRAVENOUS
  Administered 2016-05-24: 13:00:00 via INTRAVENOUS

## 2016-05-24 MED ORDER — CEFAZOLIN SODIUM-DEXTROSE 2-4 GM/100ML-% IV SOLN
2.0000 g | INTRAVENOUS | Status: AC
  Administered 2016-05-24: 2 g via INTRAVENOUS
  Filled 2016-05-24: qty 100

## 2016-05-24 MED ORDER — PROPOFOL 10 MG/ML IV BOLUS
INTRAVENOUS | Status: AC
  Filled 2016-05-24: qty 20

## 2016-05-24 MED ORDER — HYDROMORPHONE HCL 1 MG/ML IJ SOLN: 0.2500 mg | INTRAMUSCULAR | Status: DC | PRN

## 2016-05-24 MED ORDER — MIDAZOLAM HCL 5 MG/5ML IJ SOLN
INTRAMUSCULAR | Status: DC | PRN
  Administered 2016-05-24: 2 mg via INTRAVENOUS

## 2016-05-24 MED ORDER — 0.9 % SODIUM CHLORIDE (POUR BTL) OPTIME
TOPICAL | Status: DC | PRN
  Administered 2016-05-24: 1000 mL

## 2016-05-24 MED ORDER — CHLORHEXIDINE GLUCONATE CLOTH 2 % EX PADS: 6.0000 | MEDICATED_PAD | Freq: Once | CUTANEOUS | Status: DC

## 2016-05-24 MED ORDER — BUPIVACAINE HCL (PF) 0.25 % IJ SOLN
INTRAMUSCULAR | Status: AC
  Filled 2016-05-24: qty 30

## 2016-05-24 MED ORDER — ACETAMINOPHEN 500 MG PO TABS
1000.0000 mg | ORAL_TABLET | ORAL | Status: AC
  Administered 2016-05-24: 1000 mg via ORAL
  Filled 2016-05-24: qty 2

## 2016-05-24 MED ORDER — MIDAZOLAM HCL 2 MG/2ML IJ SOLN
INTRAMUSCULAR | Status: AC
  Filled 2016-05-24: qty 2

## 2016-05-24 MED ORDER — ONDANSETRON HCL 4 MG/2ML IJ SOLN
INTRAMUSCULAR | Status: DC | PRN
  Administered 2016-05-24: 4 mg via INTRAVENOUS

## 2016-05-24 MED ORDER — PROPOFOL 10 MG/ML IV BOLUS
INTRAVENOUS | Status: DC | PRN
Start: 2016-05-24 — End: 2016-05-24
  Administered 2016-05-24: 200 mg via INTRAVENOUS

## 2016-05-24 MED ORDER — SODIUM CHLORIDE 0.9 % IV SOLN
INTRAVENOUS | Status: DC | PRN
  Administered 2016-05-24: 500 mL

## 2016-05-24 MED ORDER — HEPARIN SOD (PORK) LOCK FLUSH 100 UNIT/ML IV SOLN
INTRAVENOUS | Status: AC
  Filled 2016-05-24: qty 5

## 2016-05-24 MED ORDER — BUPIVACAINE HCL (PF) 0.25 % IJ SOLN
INTRAMUSCULAR | Status: DC | PRN
  Administered 2016-05-24: 4 mL

## 2016-05-24 MED ORDER — FENTANYL CITRATE (PF) 100 MCG/2ML IJ SOLN
INTRAMUSCULAR | Status: AC
  Filled 2016-05-24: qty 2

## 2016-05-24 MED ORDER — GABAPENTIN 300 MG PO CAPS
300.0000 mg | ORAL_CAPSULE | ORAL | Status: AC
  Administered 2016-05-24: 300 mg via ORAL
  Filled 2016-05-24: qty 1

## 2016-05-24 MED ORDER — LIDOCAINE HCL (CARDIAC) 20 MG/ML IV SOLN
INTRAVENOUS | Status: DC | PRN
  Administered 2016-05-24: 80 mg via INTRAVENOUS

## 2016-05-24 SURGICAL SUPPLY — 47 items
BAG DECANTER FOR FLEXI CONT (MISCELLANEOUS) ×2 IMPLANT
BLADE SURG 11 STRL SS (BLADE) ×2 IMPLANT
BLADE SURG 15 STRL LF DISP TIS (BLADE) ×1 IMPLANT
BLADE SURG 15 STRL SS (BLADE) ×1
CHLORAPREP W/TINT 26ML (MISCELLANEOUS) ×2 IMPLANT
COVER SURGICAL LIGHT HANDLE (MISCELLANEOUS) ×2 IMPLANT
COVER TRANSDUCER ULTRASND GEL (DRAPE) ×2 IMPLANT
CRADLE DONUT ADULT HEAD (MISCELLANEOUS) ×2 IMPLANT
DECANTER SPIKE VIAL GLASS SM (MISCELLANEOUS) ×2 IMPLANT
DERMABOND ADVANCED (GAUZE/BANDAGES/DRESSINGS) ×1
DERMABOND ADVANCED .7 DNX12 (GAUZE/BANDAGES/DRESSINGS) ×1 IMPLANT
DRAPE C-ARM 42X72 X-RAY (DRAPES) ×2 IMPLANT
DRAPE CHEST BREAST 15X10 FENES (DRAPES) ×2 IMPLANT
DRAPE UTILITY XL STRL (DRAPES) ×2 IMPLANT
ELECT CAUTERY BLADE 6.4 (BLADE) ×2 IMPLANT
ELECT REM PT RETURN 9FT ADLT (ELECTROSURGICAL) ×2
ELECTRODE REM PT RTRN 9FT ADLT (ELECTROSURGICAL) ×1 IMPLANT
GAUZE SPONGE 4X4 16PLY XRAY LF (GAUZE/BANDAGES/DRESSINGS) ×2 IMPLANT
GEL ULTRASOUND 20GR AQUASONIC (MISCELLANEOUS) ×2 IMPLANT
GLOVE BIO SURGEON STRL SZ7 (GLOVE) ×2 IMPLANT
GLOVE BIOGEL PI IND STRL 7.5 (GLOVE) ×1 IMPLANT
GLOVE BIOGEL PI INDICATOR 7.5 (GLOVE) ×1
GOWN STRL REUS W/ TWL LRG LVL3 (GOWN DISPOSABLE) ×2 IMPLANT
GOWN STRL REUS W/TWL LRG LVL3 (GOWN DISPOSABLE) ×2
INTRODUCER COOK 11FR (CATHETERS) IMPLANT
KIT BASIN OR (CUSTOM PROCEDURE TRAY) ×2 IMPLANT
KIT PORT POWER 8FR ISP CVUE (Catheter) ×2 IMPLANT
KIT ROOM TURNOVER OR (KITS) ×2 IMPLANT
NEEDLE HYPO 25GX1X1/2 BEV (NEEDLE) ×2 IMPLANT
NS IRRIG 1000ML POUR BTL (IV SOLUTION) ×2 IMPLANT
PACK SURGICAL SETUP 50X90 (CUSTOM PROCEDURE TRAY) ×2 IMPLANT
PAD ARMBOARD 7.5X6 YLW CONV (MISCELLANEOUS) ×4 IMPLANT
PENCIL BUTTON HOLSTER BLD 10FT (ELECTRODE) ×2 IMPLANT
SET INTRODUCER 12FR PACEMAKER (SHEATH) IMPLANT
SET SHEATH INTRODUCER 10FR (MISCELLANEOUS) IMPLANT
SHEATH COOK PEEL AWAY SET 9F (SHEATH) IMPLANT
SUT MNCRL AB 4-0 PS2 18 (SUTURE) ×2 IMPLANT
SUT PROLENE 2 0 SH DA (SUTURE) ×2 IMPLANT
SUT SILK 2 0 (SUTURE)
SUT SILK 2-0 18XBRD TIE 12 (SUTURE) IMPLANT
SUT VIC AB 3-0 SH 27 (SUTURE) ×1
SUT VIC AB 3-0 SH 27XBRD (SUTURE) ×1 IMPLANT
SYR 20ML ECCENTRIC (SYRINGE) ×4 IMPLANT
SYR 5ML LUER SLIP (SYRINGE) ×2 IMPLANT
SYR CONTROL 10ML LL (SYRINGE) ×2 IMPLANT
TOWEL OR 17X24 6PK STRL BLUE (TOWEL DISPOSABLE) ×2 IMPLANT
TOWEL OR 17X26 10 PK STRL BLUE (TOWEL DISPOSABLE) ×2 IMPLANT

## 2016-05-24 NOTE — Op Note (Signed)
Preoperative diagnosis: breast cancer need for venous access Postoperative diagnosis: same as above Procedure: right ij US guided powerport insertion Surgeon: Dr Serita Grammes EBL: minimal Anes: general  Specimens none Complications none Drains none Sponge count correct Dispo to pacu stable  Indications: This is a 61 yof with newly diagnosed breast cancer. She is due to begin primary systemic chemotherapy. We discussed port placement.   Procedure: After informed consent was obtained the patient was taken to the operating room. She was given antibiotics. Sequential compression devices were on her legs. She was then placed under general anesthesia with an LMA. Then she was prepped and draped in the standard sterile surgical fashion. Surgical timeout was then performed.  I used the ultrasound to identify the right internal jugular vein. I then accessed the vein using the ultrasound.This aspirated blood. I then placed the wire.  This was confirmed by fluoroscopy and ultrasound to be in the correct position. I created a pocket on the right chest.I tunneled the line between the 2 sites. I then dilated the tract and placed the dilator assembly with the sheath. This was done under fluoroscopy. I then removed the sheath and dilator. The wire was also removed. The line was then pulled back to be in the venacava. I hooked this up to the port. I sutured this into place with 2-0 Prolene in 2 places. This aspirated blood and flushed easily.This was confirmed with a final fluoroscopy. I then closed this with 2-0 Vicryl and 4-0 Monocryl. Dermabond was placed on both the incisions.A dressing was placed. She tolerated this well and was transferred to the recovery room in stable condition

## 2016-05-24 NOTE — Transfer of Care (Signed)
Immediate Anesthesia Transfer of Care Note  Patient: Erika Freeman  Procedure(s) Performed: Procedure(s): INSERTION PORT-A-CATH WITH [place] (Right)  Patient Location: PACU  Anesthesia Type:General  Level of Consciousness: awake, alert , oriented and sedated  Airway & Oxygen Therapy: Patient Spontanous Breathing and Patient connected to nasal cannula oxygen  Post-op Assessment: Report given to RN, Post -op Vital signs reviewed and stable and Patient moving all extremities  Post vital signs: Reviewed and stable  Last Vitals:  Vitals:   05/24/16 1016 05/24/16 1220  BP: 137/86   Pulse: 81   Resp: 18   Temp: 36.7 C (P) 36.9 C    Last Pain:  Vitals:   05/24/16 1016  TempSrc: Oral      Patients Stated Pain Goal: 3 (19/75/88 3254)  Complications: No apparent anesthesia complications

## 2016-05-24 NOTE — Anesthesia Preprocedure Evaluation (Addendum)
Anesthesia Evaluation  Patient identified by MRN, date of birth, ID band Patient awake    Reviewed: Allergy & Precautions, NPO status , Patient's Chart, lab work & pertinent test results  Airway Mallampati: II  TM Distance: >3 FB     Dental   Pulmonary neg pulmonary ROS,    breath sounds clear to auscultation       Cardiovascular hypertension,  Rhythm:Regular Rate:Normal     Neuro/Psych    GI/Hepatic GERD  ,  Endo/Other  negative endocrine ROS  Renal/GU Renal disease     Musculoskeletal   Abdominal   Peds  Hematology  (+) anemia ,   Anesthesia Other Findings   Reproductive/Obstetrics                             Anesthesia Physical Anesthesia Plan  ASA: III  Anesthesia Plan:    Post-op Pain Management:    Induction: Intravenous  Airway Management Planned: Oral ETT  Additional Equipment:   Intra-op Plan:   Post-operative Plan: Extubation in OR  Informed Consent: I have reviewed the patients History and Physical, chart, labs and discussed the procedure including the risks, benefits and alternatives for the proposed anesthesia with the patient or authorized representative who has indicated his/her understanding and acceptance.   Dental advisory given  Plan Discussed with: CRNA and Anesthesiologist  Anesthesia Plan Comments:         Anesthesia Quick Evaluation

## 2016-05-24 NOTE — Anesthesia Postprocedure Evaluation (Signed)
Anesthesia Post Note  Patient: Erika Freeman  Procedure(s) Performed: Procedure(s) (LRB): INSERTION PORT-A-CATH WITH [place] (Right)  Patient location during evaluation: PACU Anesthesia Type: General Level of consciousness: awake Pain management: pain level controlled Vital Signs Assessment: post-procedure vital signs reviewed and stable Respiratory status: spontaneous breathing Cardiovascular status: stable Anesthetic complications: no       Last Vitals:  Vitals:   05/24/16 1500 05/24/16 1528  BP: 120/85 128/86  Pulse: 74 77  Resp: 17 16  Temp: 36.4 C     Last Pain:  Vitals:   05/24/16 1528  TempSrc:   PainSc: 0-No pain                 Kimiya Brunelle

## 2016-05-24 NOTE — Discharge Instructions (Signed)
    PORT-A-CATH: POST OP INSTRUCTIONS  Always review your discharge instruction sheet given to you by the facility where your surgery was performed.   1. A prescription for pain medication may be given to you upon discharge. Take your pain medication as prescribed, if needed. If narcotic pain medicine is not needed, then you make take acetaminophen (Tylenol) or ibuprofen (Advil) as needed.  2. Take your usually prescribed medications unless otherwise directed. 3. If you need a refill on your pain medication, please contact our office. All narcotic pain medicine now requires a paper prescription.  Phoned in and fax refills are no longer allowed by law.  Prescriptions will not be filled after 5 pm or on weekends.  4. You should follow a light diet for the remainder of the day after your procedure. 5. Most patients will experience some mild swelling and/or bruising in the area of the incision. It may take several days to resolve. 6. It is common to experience some constipation if taking pain medication after surgery. Increasing fluid intake and taking a stool softener (such as Colace) will usually help or prevent this problem from occurring. A mild laxative (Milk of Magnesia or Miralax) should be taken according to package directions if there are no bowel movements after 48 hours.  7. Unless discharge instructions indicate otherwise, you may remove your bandages 48 hours after surgery, and you may shower at that time. You may have steri-strips (small white skin tapes) in place directly over the incision.  These strips should be left on the skin for 7-10 days.  If your surgeon used Dermabond (skin glue) on the incision, you may shower in 24 hours.  The glue will flake off over the next 2-3 weeks.  8. If your port is left accessed at the end of surgery (needle left in port), the dressing cannot get wet and should only by changed by a healthcare professional. When the port is no longer accessed (when the  needle has been removed), follow step 7.   9. ACTIVITIES:  Limit activity involving your arms for the next 72 hours. Do no strenuous exercise or activity for 1 week. You may drive when you are no longer taking prescription pain medication, you can comfortably wear a seatbelt, and you can maneuver your car. 10.You may need to see your doctor in the office for a follow-up appointment.  Please       check with your doctor.  11.When you receive a new Port-a-Cath, you will get a product guide and        ID card.  Please keep them in case you need them.  WHEN TO CALL YOUR DOCTOR ([phone]): 1. Fever over 101.0 2. Chills 3. Continued bleeding from incision 4. Increased redness and tenderness at the site 5. Shortness of breath, difficulty breathing   The clinic staff is available to answer your questions during regular business hours. Please don't hesitate to call and ask to speak to one of the nurses or medical assistants for clinical concerns. If you have a medical emergency, go to the nearest emergency room or call 911.  A surgeon from [facility] is always on call at the hospital.     For further information, please visit [email or web address]      

## 2016-05-24 NOTE — Interval H&P Note (Signed)
History and Physical Interval Note:  05/24/2016 12:36 PM  Erika Freeman  has presented today for surgery, with the diagnosis of BREAST CANCER  The various methods of treatment have been discussed with the patient and family. After consideration of risks, benefits and other options for treatment, the patient has consented to  Procedure(s): INSERTION PORT-A-CATH WITH [place] (N/A) as a surgical intervention .  The patient's history has been reviewed, patient examined, no change in status, stable for surgery.  I have reviewed the patient's chart and labs.  Questions were answered to the patient's satisfaction.     Krisandra Bueno

## 2016-05-24 NOTE — Anesthesia Procedure Notes (Signed)
Procedure Name: LMA Insertion Date/Time: 05/24/2016 1:10 PM Performed by: Scheryl Darter Pre-anesthesia Checklist: Patient identified, Emergency Drugs available, Suction available and Patient being monitored Patient Re-evaluated:Patient Re-evaluated prior to inductionOxygen Delivery Method: Circle System Utilized Preoxygenation: Pre-oxygenation with 100% oxygen Intubation Type: IV induction Ventilation: Mask ventilation without difficulty LMA: LMA inserted LMA Size: 4.0 Number of attempts: 1 Placement Confirmation: positive ETCO2 Tube secured with: Tape Dental Injury: Teeth and Oropharynx as per pre-operative assessment

## 2016-05-24 NOTE — H&P (Signed)
56 yof referred by Dr Dory Horn for newly diagnosed left breast cancer. she has prior history of renal tx for fsgs and is followed by Dr Florene Glen. she is doing well from that. she is here with her husband and one of her sons. she has prior history of removing left fa in [place] and has cosmetic defect with that. she noted a left breast mass near prior excisional biopsy a month ago. this has not changed. she has no dc. she underwent mm that showed a left breast mass. on [place] this is a 2.6x1.8x1.5 cm mass. her axilla is negative. she has a 5 mm mass that is about 1.4 cm away from this that has not been biopsied. biopsy of the mass is a grade III IDC that is er positive, pr negative, her 2 negative, and Ki is 90%.   Past Surgical History [name], MD; 05/18/2016 12:17 PM) Breast Biopsy  Left. multiple Cesarean Section - 1  Thyroid Surgery  Other Surgery  renal transplant  Diagnostic Studies History Conni Slipper, RN; 05/18/2016 8:08 AM) Colonoscopy  1-5 years ago Mammogram  within last year Pap Smear  1-5 years ago  Medication History Conni Slipper, RN; 05/18/2016 8:08 AM) Medications Reconciled  Social History Conni Slipper, RN; 05/18/2016 8:08 AM) Alcohol use  Occasional alcohol use. Caffeine use  Carbonated beverages. No drug use  Tobacco use  Never smoker.  Family History Conni Slipper, RN; 05/18/2016 8:08 AM) Cancer  Brother, Family Members In General. Cerebrovascular Accident  Family Members In General. Diabetes Mellitus  Family Members In General. Hypertension  Family Members In General, Mother. Prostate Cancer  Brother. Respiratory Condition  Brother, Family Members In General.  Pregnancy / Birth History Conni Slipper, RN; 05/18/2016 8:08 AM) Age at menarche  56 years. Contraceptive History  Oral contraceptives. Gravida  3 Length (months) of breastfeeding  3-6 Maternal age  24-25 Para  3 Regular periods   Other Problems Conni Slipper, RN;  05/18/2016 8:08 AM) Chronic Renal Failure Syndrome  Gastroesophageal Reflux Disease  High blood pressure  Hypercholesterolemia  Lump In Breast   Review of Systems Conni Slipper RN; 05/18/2016 8:08 AM) General Not Present- Appetite Loss, Chills, Fatigue, Fever, Night Sweats, Weight Gain and Weight Loss. Skin Not Present- Change in Wart/Mole, Dryness, Hives, Jaundice, New Lesions, Non-Healing Wounds, Rash and Ulcer. HEENT Present- Seasonal Allergies. Not Present- Earache, Hearing Loss, Hoarseness, Nose Bleed, Oral Ulcers, Ringing in the Ears, Sinus Pain, Sore Throat, Visual Disturbances, Wears glasses/contact lenses and Yellow Eyes. Respiratory Present- Snoring. Not Present- Bloody sputum, Chronic Cough, Difficulty Breathing and Wheezing. Breast Present- Breast Mass. Not Present- Breast Pain, Nipple Discharge and Skin Changes. Cardiovascular Not Present- Chest Pain, Difficulty Breathing Lying Down, Leg Cramps, Palpitations, Rapid Heart Rate, Shortness of Breath and Swelling of Extremities. Gastrointestinal Present- Indigestion. Not Present- Abdominal Pain, Bloating, Bloody Stool, Change in Bowel Habits, Chronic diarrhea, Constipation, Difficulty Swallowing, Excessive gas, Gets full quickly at meals, Hemorrhoids, Nausea, Rectal Pain and Vomiting. Female Genitourinary Not Present- Frequency, Nocturia, Painful Urination, Pelvic Pain and Urgency. Musculoskeletal Not Present- Back Pain, Joint Pain, Joint Stiffness, Muscle Pain, Muscle Weakness and Swelling of Extremities. Neurological Not Present- Decreased Memory, Fainting, Headaches, Numbness, Seizures, Tingling, Tremor, Trouble walking and Weakness. Psychiatric Not Present- Anxiety, Bipolar, Change in Sleep Pattern, Depression, Fearful and Frequent crying. Endocrine Present- Hot flashes. Not Present- Cold Intolerance, Excessive Hunger, Hair Changes, Heat Intolerance and New Diabetes. Hematology Not Present- Blood Thinners, Easy Bruising, Excessive  bleeding, Gland problems, HIV and Persistent  Infections.   Physical Exam [name] MD; 05/18/2016 12:17 PM) General Mental Status-Alert. Orientation-Oriented X3. Chest and Lung Exam Chest and lung exam reveals -on auscultation, normal breath sounds, no adventitious sounds and normal vocal resonance. Breast Nipples-No Discharge. Cardiovascular Cardiovascular examination reveals -normal heart sounds, regular rate and rhythm with no murmurs. Lymphatic Head & Neck General Head & Neck Lymphatics: Bilateral - Description - Normal. Axillary General Axillary Region: Bilateral - Description - Normal. Note: no Apex adenopathy   Assessment & Plan [name] MD; 05/18/2016 12:15 PM) BREAST CANCER OF LOWER-OUTER QUADRANT OF LEFT FEMALE BREAST (C50.512) Port placement for systemic chemotherapy

## 2016-05-25 ENCOUNTER — Other Ambulatory Visit: Payer: Medicare Other

## 2016-05-25 ENCOUNTER — Encounter: Payer: Self-pay | Admitting: *Deleted

## 2016-05-25 ENCOUNTER — Encounter (HOSPITAL_COMMUNITY): Payer: Self-pay | Admitting: General Surgery

## 2016-05-25 DIAGNOSIS — R293 Abnormal posture: Secondary | ICD-10-CM | POA: Diagnosis not present

## 2016-05-25 DIAGNOSIS — Z170 Estrogen receptor positive status [ER+]: Secondary | ICD-10-CM | POA: Diagnosis not present

## 2016-05-25 DIAGNOSIS — C50312 Malignant neoplasm of lower-inner quadrant of left female breast: Secondary | ICD-10-CM | POA: Diagnosis not present

## 2016-05-27 ENCOUNTER — Ambulatory Visit (HOSPITAL_BASED_OUTPATIENT_CLINIC_OR_DEPARTMENT_OTHER): Payer: Medicare Other | Admitting: Oncology

## 2016-05-27 VITALS — BP 130/78 | HR 82 | Temp 98.1°F | Resp 20 | Ht 65.0 in | Wt 156.8 lb

## 2016-05-27 DIAGNOSIS — Z170 Estrogen receptor positive status [ER+]: Secondary | ICD-10-CM

## 2016-05-27 DIAGNOSIS — N186 End stage renal disease: Secondary | ICD-10-CM

## 2016-05-27 DIAGNOSIS — N185 Chronic kidney disease, stage 5: Secondary | ICD-10-CM

## 2016-05-27 DIAGNOSIS — C50312 Malignant neoplasm of lower-inner quadrant of left female breast: Secondary | ICD-10-CM

## 2016-05-27 DIAGNOSIS — N051 Unspecified nephritic syndrome with focal and segmental glomerular lesions: Secondary | ICD-10-CM

## 2016-05-27 MED ORDER — DEXAMETHASONE 4 MG PO TABS: ORAL_TABLET | ORAL | 1 refills | Status: DC

## 2016-05-27 MED ORDER — LIDOCAINE-PRILOCAINE 2.5-2.5 % EX CREA: TOPICAL_CREAM | CUTANEOUS | 3 refills | Status: DC

## 2016-05-27 MED ORDER — LORATADINE 10 MG PO TABS: 10.0000 mg | ORAL_TABLET | Freq: Every day | ORAL | 0 refills | Status: DC

## 2016-05-27 MED ORDER — LORAZEPAM 0.5 MG PO TABS: 0.5000 mg | ORAL_TABLET | Freq: Every day | ORAL | 0 refills | Status: DC

## 2016-05-27 MED ORDER — PROCHLORPERAZINE MALEATE 10 MG PO TABS: 10.0000 mg | ORAL_TABLET | Freq: Four times a day (QID) | ORAL | 1 refills | Status: DC | PRN

## 2016-05-27 NOTE — Progress Notes (Signed)
[place]  Telephone:([phone]) [phone] Fax:(336) [phone]     ID: [name] DOB: 1960/03/06  MR#: [id number]  JQZ#:[id number]  Patient Care Team: [name], MD as PCP - General (Obstetrics and Gynecology) [name], MD as Consulting Physician (Nephrology) [name], MD as Consulting Physician (Obstetrics and Gynecology) [name], MD as Consulting Physician (General Surgery) [name], MD as Consulting Physician (Oncology) [name], MD as Consulting Physician (Radiation Oncology) [name], MD as Referring Physician (Surgery) [name], DO (Internal Medicine) [name], MD OTHER MD:  CHIEF COMPLAINT: Estrogen receptor positive breast cancer  CURRENT TREATMENT: Neoadjuvant chemotherapy  INTERVAL HISTORY: Erika Freeman returns today for follow-up of her estrogen receptor positive breast cancer accompanied by her husband Erika Freeman, their son, and the patient's mother. Since her last visit here we discussed neoadjuvant chemotherapy with her nephrologist and transplant surgeon at [facility] as well as her nephrologist in [place] and her breast surgeon. The consensus is that the patient should receive neoadjuvant chemotherapy with a view to shrinking the tumor to hopefully allow her to keep her breast with a good cosmetic result..  This will necessitate a change in the intensity of her immunosuppression and this is being modulated through the [facility]. They have stopped her CellCept, decrease her dose of Prograf, and started her on prednisone 5 mg daily.  Since her last visit here Erika Freeman also underwent port placement on 05/24/2016. She is scheduled for an echocardiogram in 05/31/2016 and to start chemotherapy next day.  REVIEW OF SYSTEMS: She tolerated port placement with no unusual complications or side effects. She also came to chemotherapy school and appreciated the information. She is anticipating hair loss,  as well as some of the other side effects that are common but hopefully not terribly intense with the chemotherapy that she is going to be receiving. A detailed review of systems today was otherwise stable  BREAST CANCER HISTORY: From the original intake note:  "Erika Freeman" Freeman noted a mass in her left breast. She ignored it because she thought it was related to menstruation. However as it persisted through 1. She brought it to Dr. [name] attention and on 05/10/2016 she underwent left diagnostic mammography with tomography [facility] The breast density was category C. In the left breast lower central area there was a new circumscribed mass. This was firm and fixed in the lower inner left breast at middle depth. Ultrasonography confirmed a 2.6 cm hypoechoic irregular mass at the 6:30 o'clock radiant 3 cm from the nipple. There was also a 0.5 cm nodule 1.4 cm medial to the mass just described.  Biopsy of the left breast mass in question 05/10/2016 showed([id number]) invasive ductal carcinoma, grade 3, estrogen receptor 70% positive with weak staining intensity, progesterone receptor negative, with no HER-2 amplification, the signals ratio being 1.52 and the number per cell 2.35. The proliferation marker was 90%.  Her subsequent history is as detailed below  OF NOTE: The patient has a history of focal segmental glomerular sclerosis with end-stage renal disease and is status post unrelated donor renal transplant 04/15/2013 as part of appeared donor exchange with her husband donating one kidney, the other harvested from a 55 year old [place] man. She continues on immunosuppression with mycophenolate and tacrolimus.  PAST MEDICAL HISTORY: Past Medical History:  Diagnosis Date  . Anemia   . Cancer [facility])    breast cancer  . Chills   . Chronic kidney disease   .  Cough   . Fibroid tumor   . GERD (gastroesophageal reflux disease)   . Heart murmur   .  Hypertension    DX  30 YRS AGO  . Umbilical hernia s/p primary repair 01/10/2012 01/24/2012  . Vomiting     PAST SURGICAL HISTORY: Past Surgical History:  Procedure Laterality Date  . BREAST SURGERY  2012   left - fibroadenoma  . CAPD INSERTION  01/10/2012   Procedure: LAPAROSCOPIC INSERTION CONTINUOUS AMBULATORY PERITONEAL DIALYSIS  (CAPD) CATHETER;  Surgeon: Adin Hector, MD;  Location: [place];  Service: General;  Laterality: N/A;  LAPAROSCOPIC CAPD CATHETER PLACEMENT OMENTOPEXY  . CESAREAN SECTION  1996  . DILATION AND CURETTAGE OF UTERUS    . KIDNEY TRANSPLANT    . PARATHYROIDECTOMY  2000 - approximate  . PORTACATH PLACEMENT Right 05/24/2016   Procedure: INSERTION PORT-A-CATH WITH [place];  Surgeon: [name], MD;  Location: [place];  Service: General;  Laterality: Right;  . RENAL BIOPSY  2011  . UMBILICAL HERNIA REPAIR  01/10/2012   Procedure: HERNIA REPAIR UMBILICAL ADULT;  Surgeon: Adin Hector, MD;  Location: [place];  Service: General;  Laterality: N/A;    FAMILY HISTORY Family History  Problem Relation Age of Onset  . Cancer Maternal Uncle     lung  . Cancer Cousin     breast  . Cancer Cousin     breast  The patient has very little information regarding her father. Her mother is living, 71 years old as of March 2018. The patient had one full brother, diagnosed with prostate cancer in his 21s. The patient has 2 half-brothers and 1 half-sister. There is no history of breast or ovarian cancer in the family to the patient's knowledge   GYNECOLOGIC HISTORY:  Patient's last menstrual period was 04/27/2016 (exact date).  Menarche age 71, first live birth age 22, the patient is GX P3. Her periods are regular, the last 5 or 6 days, with no heavy days. 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 Her husband white works at home [employer]. Son Erika Freeman lives in [place] and as Freight forwarder of a  Therapist, art. Son Erika Freeman lives in [place] and is a Air traffic controller. Son Erika Freeman lives in [place] and is studying based trombone. The patient has 2 grandchildren. She attends a local [employer]:  not in place    HEALTH MAINTENANCE: Social History  Substance Use Topics  . Smoking status: Never Smoker  . Smokeless tobacco: Never Used  . Alcohol use No     Colonoscopy:  PAP:  Bone density:   Allergies  Allergen Reactions  . No Known Allergies     Current Outpatient Prescriptions  Medication Sig Dispense Refill  . predniSONE 5 MG/5ML solution Take 5 mg by mouth daily with breakfast.    . amLODipine (NORVASC) 5 MG tablet Take 5 mg by mouth daily.     [name] aspirin 81 MG tablet Take 81 mg by mouth daily.    [name] dexamethasone (DECADRON) 4 MG tablet Take 2 tablets by mouth once a day on the day after chemotherapy and then take 2 tablets two times a day for 2 days. Take with food. 30 tablet 1  . Iron, Ferrous Sulfate, 142 (45 Fe) MG TBCR Take 1 tablet by mouth daily.    [name] lidocaine-prilocaine (EMLA) cream Apply to affected area  once 30 g 3  . loratadine (CLARITIN) 10 MG tablet Take 1 tablet (10 mg total) by mouth daily. 100 tablet 0  . LORazepam (ATIVAN) 0.5 MG tablet Take 1 tablet (0.5 mg total) by mouth at bedtime. 30 tablet 0  . lovastatin (MEVACOR) 20 MG tablet Take 20 mg by mouth every Monday, Wednesday, and Friday.     . magnesium (MAGTAB) 84 MG (7MEQ) TBCR SR tablet Take 168-252 mg by mouth See admin instructions. 2 tabs at [facility], and 3 tabs at 1700    . prochlorperazine (COMPAZINE) 10 MG tablet Take 1 tablet (10 mg total) by mouth every 6 (six) hours as needed (Nausea or vomiting). 30 tablet 1  . SENSIPAR 60 MG tablet Take 60 mg by mouth daily.     . tacrolimus (PROGRAF) 1 MG capsule Take 3 mg by mouth 2 (two) times daily.     No current facility-administered medications for this visit.     OBJECTIVE:  Middle-aged African-American woman who appears well   Vitals:   05/27/16 1359  BP: 130/78  Pulse: 82  Resp: 20  Temp: 98.1 F (36.7 C)     Body mass index is 26.09 kg/m.    ECOG FS:1 - Symptomatic but completely ambulatory  Sclerae unicteric, pupils round and equal Oropharynx clear and moist No cervical or supraclavicular adenopathy Lungs no rales or rhonchi Heart regular rate and rhythm Abd soft, nontender, positive bowel sounds MSK no focal spinal tenderness, no upper extremity lymphedema Neuro: nonfocal, well oriented, appropriate affect Breasts: The breasts were not reexamined today but the patient does have an easily palpable mass in the left inferior mammary fold  Skin: The port incision is healing nicely in the right anterior upper chest. The remainder the port is easily palpable  LAB RESULTS:  CMP     Component Value Date/Time   NA 142 05/24/2016 1104   NA 143 05/18/2016 0827   K 3.3 (L) 05/24/2016 1104   K 3.4 (L) 05/18/2016 0827   CL 108 05/24/2016 1104   CO2 23 05/24/2016 1104   CO2 24 05/18/2016 0827   GLUCOSE 94 05/24/2016 1104   GLUCOSE 103 05/18/2016 0827   BUN 11 05/24/2016 1104   BUN 12.0 05/18/2016 0827   CREATININE 0.99 05/24/2016 1104   CREATININE 1.1 05/18/2016 0827   CALCIUM 8.3 (L) 05/24/2016 1104   CALCIUM 8.5 05/18/2016 0827   PROT 7.4 05/18/2016 0827   ALBUMIN 4.2 05/18/2016 0827   AST 13 05/18/2016 0827   ALT 16 05/18/2016 0827   ALKPHOS 74 05/18/2016 0827   BILITOT 0.41 05/18/2016 0827   GFRNONAA >60 05/24/2016 1104   GFRAA >60 05/24/2016 1104    No results found for: TOTALPROTELP, ALBUMINELP, A1GS, A2GS, BETS, BETA2SER, GAMS, MSPIKE, SPEI  No results found for: Nils Pyle, [facility]  Lab Results  Component Value Date   WBC 10.4 05/24/2016   NEUTROABS 9.5 (H) 05/18/2016   HGB 11.4 (L) 05/24/2016   HCT 36.8 05/24/2016   MCV 82.3 05/24/2016   PLT 222 05/24/2016      Chemistry      Component Value Date/Time     NA 142 05/24/2016 1104   NA 143 05/18/2016 0827   K 3.3 (L) 05/24/2016 1104   K 3.4 (L) 05/18/2016 0827   CL 108 05/24/2016 1104   CO2 23 05/24/2016 1104   CO2 24 05/18/2016 0827   BUN 11 05/24/2016 1104   BUN 12.0 05/18/2016 0827   CREATININE 0.99 05/24/2016 1104  CREATININE 1.1 05/18/2016 0827      Component Value Date/Time   CALCIUM 8.3 (L) 05/24/2016 1104   CALCIUM 8.5 05/18/2016 0827   ALKPHOS 74 05/18/2016 0827   AST 13 05/18/2016 0827   ALT 16 05/18/2016 0827   BILITOT 0.41 05/18/2016 0827       No results found for: LABCA2  No components found for: [id number]  No results for input(s): INR in the last 168 hours.  Urinalysis    Component Value Date/Time   COLORURINE YELLOW 11/24/2006 1539   APPEARANCEUR CLEAR 11/24/2006 1539   LABSPEC 1.018 11/24/2006 1539   PHURINE 6.0 11/24/2006 1539   GLUCOSEU NEGATIVE 11/24/2006 1539   HGBUR SMALL (A) 11/24/2006 1539   BILIRUBINUR NEGATIVE 11/24/2006 1539   KETONESUR NEGATIVE 11/24/2006 1539   PROTEINUR 100 (A) 11/24/2006 1539   UROBILINOGEN 1.0 11/24/2006 1539   NITRITE NEGATIVE 11/24/2006 1539   LEUKOCYTESUR MODERATE (A) 11/24/2006 1539     STUDIES: Dg Chest Port 1 View  Result Date: 05/24/2016 CLINICAL DATA:  Status post Port-A-Cath placement today. EXAM: PORTABLE CHEST 1 VIEW COMPARISON:  PA and lateral chest 01/02/2012. FINDINGS: Right IJ approach Port-A-Cath tip projects in the mid to lower superior vena cava. No pneumothorax. Lungs are clear. Heart size is upper normal. IMPRESSION: Port-A-Cath tip projects in the mid to lower superior vena cava. Negative for pneumothorax or acute disease. Electronically Signed   By: Inge Rise M.D.   On: 05/24/2016 15:03   Dg Fluoro Guide Cv Line-no Report  Result Date: 05/24/2016 Fluoroscopy was utilized by the requesting physician.  No radiographic interpretation.   US Breast Ltd Uni Left Inc Axilla  Addendum Date: 05/13/2016   ADDENDUM REPORT: 05/13/2016 12:39  ADDENDUM: Ultrasound evaluation of the left axilla demonstrates no evidence of lymphadenopathy. Electronically Signed   By: Fidela Salisbury M.D.   On: 05/13/2016 12:39   Result Date: 05/13/2016 CLINICAL DATA:  Left lower central breast area of palpable concern. Patient has a history of benign excisional biopsy of the left inferior breast demonstrating fibroadenoma. Patient has a functional transplanted kidney. EXAM: 2D DIGITAL DIAGNOSTIC LEFT MAMMOGRAM WITH CAD AND ADJUNCT TOMO ULTRASOUND LEFT BREAST COMPARISON:  Previous exam(s). ACR Breast Density Category c: The breast tissue is heterogeneously dense, which may obscure small masses. FINDINGS: Mammographically, there is a new circumscribed mass in the left lower central breast, posterior depth, likely accounting for patient's area of palpable concern. Postsurgical architectural distortion is seen in the subareolar inferior left breast, from prior benign excisional biopsy. Mammographic images were processed with CAD. On physical exam, there is a firm fixed palpable mass in the lower slightly inner left breast, middle depth. Targeted ultrasound is performed, showing left breast 630 o'clock 3 cm from the nipple hypoechoic somewhat irregular mass measuring 1.8 x 1.5 x 2.6 cm. 1.4 cm medial to it, there is a 5 mm satellite nodule versus area of postsurgical scarring. IMPRESSION: Left breast 630 o'clock palpable suspicious mass, for which ultrasound-guided core needle biopsy is recommended. 5 mm satellite nodule versus area of postsurgical scarring, 1.4 cm medial to the mass. Even if this represents a satellite nodule, given its close proximity, it will likely be included during resection of the index mass. RECOMMENDATION: Ultrasound-guided core needle biopsy of the left breast. I have discussed the findings and recommendations with the patient. Results were also provided in writing at the conclusion of the visit. If applicable, a reminder letter will be sent to  the patient regarding the next appointment. BI-RADS CATEGORY  4: Suspicious. Electronically Signed: By: Fidela Salisbury M.D. On: 05/10/2016 15:18   Mm Diag Breast Tomo Uni Left  Addendum Date: 05/13/2016   ADDENDUM REPORT: 05/13/2016 12:39 ADDENDUM: Ultrasound evaluation of the left axilla demonstrates no evidence of lymphadenopathy. Electronically Signed   By: Fidela Salisbury M.D.   On: 05/13/2016 12:39   Result Date: 05/13/2016 CLINICAL DATA:  Left lower central breast area of palpable concern. Patient has a history of benign excisional biopsy of the left inferior breast demonstrating fibroadenoma. Patient has a functional transplanted kidney. EXAM: 2D DIGITAL DIAGNOSTIC LEFT MAMMOGRAM WITH CAD AND ADJUNCT TOMO ULTRASOUND LEFT BREAST COMPARISON:  Previous exam(s). ACR Breast Density Category c: The breast tissue is heterogeneously dense, which may obscure small masses. FINDINGS: Mammographically, there is a new circumscribed mass in the left lower central breast, posterior depth, likely accounting for patient's area of palpable concern. Postsurgical architectural distortion is seen in the subareolar inferior left breast, from prior benign excisional biopsy. Mammographic images were processed with CAD. On physical exam, there is a firm fixed palpable mass in the lower slightly inner left breast, middle depth. Targeted ultrasound is performed, showing left breast 630 o'clock 3 cm from the nipple hypoechoic somewhat irregular mass measuring 1.8 x 1.5 x 2.6 cm. 1.4 cm medial to it, there is a 5 mm satellite nodule versus area of postsurgical scarring. IMPRESSION: Left breast 630 o'clock palpable suspicious mass, for which ultrasound-guided core needle biopsy is recommended. 5 mm satellite nodule versus area of postsurgical scarring, 1.4 cm medial to the mass. Even if this represents a satellite nodule, given its close proximity, it will likely be included during resection of the index mass.  RECOMMENDATION: Ultrasound-guided core needle biopsy of the left breast. I have discussed the findings and recommendations with the patient. Results were also provided in writing at the conclusion of the visit. If applicable, a reminder letter will be sent to the patient regarding the next appointment. BI-RADS CATEGORY  4: Suspicious. Electronically Signed: By: Fidela Salisbury M.D. On: 05/10/2016 15:18   Mm Clip Placement Left  Result Date: 05/10/2016 CLINICAL DATA:  Status post ultrasound-guided core needle biopsy of a 2.6 cm mass in the 6:30 o'clock position of the left breast. EXAM: DIAGNOSTIC LEFT MAMMOGRAM POST ULTRASOUND BIOPSY COMPARISON:  Previous exam(s). FINDINGS: Mammographic images were obtained following ultrasound guided biopsy of the recently demonstrated 2.6 cm mass in the 6:30 o'clock position of the left breast. These demonstrate a ribbon shaped biopsy marker clip within the biopsied mass. IMPRESSION: Appropriate clip deployment following left breast ultrasound-guided core needle biopsy. Final Assessment: Post Procedure Mammograms for Marker Placement Electronically Signed   By: Claudie Revering M.D.   On: 05/10/2016 16:17   [place] Lt Breast Bx W Loc Dev 1st Lesion Img Bx Spec US Guide  Addendum Date: 05/12/2016   ADDENDUM REPORT: 05/12/2016 07:14 ADDENDUM: Pathology revealed grade III invasive ductal carcinoma in the left breast. This was found to be concordant by Dr. Claudie Revering. Pathology results were discussed with the patient by telephone. The patient reported doing well after the biopsy with tenderness at the site. Post biopsy instructions and care were reviewed and questions were answered. The patient was encouraged to call [facility] for any additional concerns. The patient was referred to the [facility] at the [facility] on May 18, 2016. Pathology results reported by Susa Raring RN, BSN on 05/12/2016.  Electronically Signed   By: Claudie Revering  M.D.   On: 05/12/2016 07:14   Result Date: 05/12/2016 CLINICAL DATA:  2.6 cm mass in the 6:30 o'clock position of the left breast with imaging features suspicious for malignancy. EXAM: ULTRASOUND GUIDED LEFT BREAST CORE NEEDLE BIOPSY COMPARISON:  Previous exam(s). FINDINGS: I met with the patient and we discussed the procedure of ultrasound-guided biopsy, including benefits and alternatives. We discussed the high likelihood of a successful procedure. We discussed the risks of the procedure, including infection, bleeding, tissue injury, clip migration, and inadequate sampling. Informed written consent was given. The usual time-out protocol was performed immediately prior to the procedure. Using sterile technique and 1% Lidocaine as local anesthetic, under direct ultrasound visualization, a 12 gauge spring-loaded device was used to perform biopsy of the 2.6 cm mass seen in the 6:30 o'clock position of the left breast at mammography and ultrasound earlier today using a caudal approach. At the conclusion of the procedure a ribbon shaped tissue marker clip was deployed into the biopsy cavity. Follow up 2 view mammogram was performed and dictated separately. IMPRESSION: Ultrasound guided biopsy of a 2.6 cm mass in the 6:30 o'clock position of the left breast. No apparent complications. Electronically Signed: By: Claudie Revering M.D. On: 05/10/2016 16:16    ELIGIBLE FOR AVAILABLE RESEARCH PROTOCOL: no  ASSESSMENT: 56 y.o. [place], [place] woman status post left breast lower inner quadrant biopsy 05/10/2016 for a clinical  T2 N0, prognostic stage IIB invaside ductal carcinoma, grade 3, weekly estrogen receptor positive, progesterone receptor negative, with no HER-2 amplification, and the MIB-1 at 90%  (1) neoadjuvant chemotherapy will consist of doxorubicin and cyclophosphamide in dose dense fashion 4 to start 06/01/2016 followed by Abraxane weekly 12  (2) definitive surgery to  follow, with breast conservation preferred if feasible   (a) satellite lesion noted on [place] to be included in final surgery sample  (3) adjuvant radiation as appropriate  (4) anti-estrogens to follow the completion of local treatment  PLAN: We spent approximately 35 minutes today reviewing the overall treatment plan or [place]. We have updated her medication list, including the changes made by her [facility] transplant team. I then gave her a "roadmap" on how to take her antinausea and other supportive medications. We went over in detail how to use that roadmap". All the necessary prescriptions have been placed in her pharmacy and she will be getting them filled this weekend. I also wrote her a prescription for cranial prostheses  I also added partly so she does not have to return on day 3 for a shot and partly so we do not mistakenly forget to schedule her or she forgets to come for the treatment, which would results in a febrile neutropenia admission.  Even though I anticipate her doing well with the treatment, I have encouraged her to call us with any questions or concerns. We discussed infection prevention and she is very active at [employer]. She also has a baby grandchild. She is going to use and alcohol gel to wash your hands after shaking hands a church or elsewhere and she is going to keep the baby away from her face at least for the next 8 weeks  Once she completes the initial portion of chemotherapy, namely the doxorubicin and cyclophosphamide portion, the Abraxane treatments will be much easier on her.  I have answered all her follow-up appointments through May.  She knows to call for any problems that may develop before her next visit.  :[name], MD   05/27/2016 2:39 PM Medical  Oncology and Hematology [facility] [address] [place], [place] [address] Tel. [phone]    Fax. [phone]

## 2016-05-30 ENCOUNTER — Other Ambulatory Visit: Payer: Self-pay | Admitting: *Deleted

## 2016-05-30 DIAGNOSIS — C50312 Malignant neoplasm of lower-inner quadrant of left female breast: Secondary | ICD-10-CM

## 2016-05-30 DIAGNOSIS — N051 Unspecified nephritic syndrome with focal and segmental glomerular lesions: Secondary | ICD-10-CM

## 2016-05-30 DIAGNOSIS — N185 Chronic kidney disease, stage 5: Secondary | ICD-10-CM

## 2016-05-30 DIAGNOSIS — Z170 Estrogen receptor positive status [ER+]: Principal | ICD-10-CM

## 2016-05-30 MED ORDER — LIDOCAINE-PRILOCAINE 2.5-2.5 % EX CREA: TOPICAL_CREAM | CUTANEOUS | 3 refills | Status: DC

## 2016-05-30 MED ORDER — DEXAMETHASONE 4 MG PO TABS: ORAL_TABLET | ORAL | 1 refills | Status: DC

## 2016-05-30 MED ORDER — PROCHLORPERAZINE MALEATE 10 MG PO TABS: 10.0000 mg | ORAL_TABLET | Freq: Four times a day (QID) | ORAL | 1 refills | Status: DC | PRN

## 2016-05-31 ENCOUNTER — Ambulatory Visit (HOSPITAL_COMMUNITY): Payer: Medicare Other | Attending: Cardiology

## 2016-05-31 ENCOUNTER — Other Ambulatory Visit: Payer: Self-pay | Admitting: *Deleted

## 2016-05-31 ENCOUNTER — Encounter: Payer: Self-pay | Admitting: Oncology

## 2016-05-31 ENCOUNTER — Other Ambulatory Visit: Payer: Self-pay | Admitting: Oncology

## 2016-05-31 ENCOUNTER — Other Ambulatory Visit: Payer: Self-pay

## 2016-05-31 DIAGNOSIS — C50312 Malignant neoplasm of lower-inner quadrant of left female breast: Secondary | ICD-10-CM | POA: Insufficient documentation

## 2016-05-31 DIAGNOSIS — Z170 Estrogen receptor positive status [ER+]: Principal | ICD-10-CM

## 2016-05-31 DIAGNOSIS — I313 Pericardial effusion (noninflammatory): Secondary | ICD-10-CM | POA: Diagnosis not present

## 2016-05-31 DIAGNOSIS — N185 Chronic kidney disease, stage 5: Secondary | ICD-10-CM

## 2016-05-31 DIAGNOSIS — N051 Unspecified nephritic syndrome with focal and segmental glomerular lesions: Secondary | ICD-10-CM

## 2016-05-31 NOTE — Progress Notes (Signed)
Received PA request for Lidocaine-Prilocaine cream. Called [employer]'@[phone]'$ [phone] to initiate pa over the phone being that patient is to start tx tomorrow. Spoke with Kathrin and clinical pharmacist(Anthony) to answer clinical questions. Elberta Fort[employer] approved PA for 3 months through 08/30/16. Kathrin will send approval via fax.  Called [facility](Kevin) to advise of approval. He states it wasn't showing yet but would re-run in about 10 minutes.

## 2016-06-01 ENCOUNTER — Other Ambulatory Visit (HOSPITAL_BASED_OUTPATIENT_CLINIC_OR_DEPARTMENT_OTHER): Payer: Medicare Other

## 2016-06-01 ENCOUNTER — Ambulatory Visit (HOSPITAL_BASED_OUTPATIENT_CLINIC_OR_DEPARTMENT_OTHER): Payer: Medicare Other

## 2016-06-01 VITALS — BP 134/82 | HR 94 | Temp 98.4°F | Resp 18

## 2016-06-01 DIAGNOSIS — N051 Unspecified nephritic syndrome with focal and segmental glomerular lesions: Secondary | ICD-10-CM

## 2016-06-01 DIAGNOSIS — Z5111 Encounter for antineoplastic chemotherapy: Secondary | ICD-10-CM | POA: Diagnosis not present

## 2016-06-01 DIAGNOSIS — C50312 Malignant neoplasm of lower-inner quadrant of left female breast: Secondary | ICD-10-CM

## 2016-06-01 DIAGNOSIS — N185 Chronic kidney disease, stage 5: Secondary | ICD-10-CM

## 2016-06-01 DIAGNOSIS — Z170 Estrogen receptor positive status [ER+]: Principal | ICD-10-CM

## 2016-06-01 LAB — COMPREHENSIVE METABOLIC PANEL
ALBUMIN: 4.2 g/dL (ref 3.5–5.0)
ALK PHOS: 72 U/L (ref 40–150)
ALT: 13 U/L (ref 0–55)
ANION GAP: 7 meq/L (ref 3–11)
AST: 12 U/L (ref 5–34)
BUN: 12.3 mg/dL (ref 7.0–26.0)
CALCIUM: 9.1 mg/dL (ref 8.4–10.4)
CO2: 26 mEq/L (ref 22–29)
Chloride: 109 mEq/L (ref 98–109)
Creatinine: 1 mg/dL (ref 0.6–1.1)
EGFR: 78 mL/min/{1.73_m2} — AB (ref >90)
Glucose: 107 mg/dl (ref 70–140)
POTASSIUM: 3.5 meq/L (ref 3.5–5.1)
Sodium: 142 mEq/L (ref 136–145)
Total Bilirubin: 0.37 mg/dL (ref 0.20–1.20)
Total Protein: 7.3 g/dL (ref 6.4–8.3)

## 2016-06-01 LAB — CBC WITH DIFFERENTIAL/PLATELET
BASO%: 0.3 % (ref 0.0–2.0)
BASOS ABS: 0 10*3/uL (ref 0.0–0.1)
EOS ABS: 0.2 10*3/uL (ref 0.0–0.5)
EOS%: 1.4 % (ref 0.0–7.0)
HEMATOCRIT: 36.5 % (ref 34.8–46.6)
HEMOGLOBIN: 11.6 g/dL (ref 11.6–15.9)
LYMPH#: 1.6 10*3/uL (ref 0.9–3.3)
LYMPH%: 11.8 % — ABNORMAL LOW (ref 14.0–49.7)
MCH: 25.4 pg (ref 25.1–34.0)
MCHC: 31.7 g/dL (ref 31.5–36.0)
MCV: 80.2 fL (ref 79.5–101.0)
MONO#: 0.7 10*3/uL (ref 0.1–0.9)
MONO%: 5.1 % (ref 0.0–14.0)
NEUT#: 11 10*3/uL — ABNORMAL HIGH (ref 1.5–6.5)
NEUT%: 81.4 % — AB (ref 38.4–76.8)
PLATELETS: 238 10*3/uL (ref 145–400)
RBC: 4.55 10*6/uL (ref 3.70–5.45)
RDW: 14.5 % (ref 11.2–14.5)
WBC: 13.5 10*3/uL — ABNORMAL HIGH (ref 3.9–10.3)

## 2016-06-01 MED ORDER — SODIUM CHLORIDE 0.9 % IV SOLN
600.0000 mg/m2 | Freq: Once | INTRAVENOUS | Status: AC
  Administered 2016-06-01: 1100 mg via INTRAVENOUS
  Filled 2016-06-01: qty 55

## 2016-06-01 MED ORDER — DOXORUBICIN HCL CHEMO IV INJECTION 2 MG/ML
60.0000 mg/m2 | Freq: Once | INTRAVENOUS | Status: AC
  Administered 2016-06-01: 110 mg via INTRAVENOUS
  Filled 2016-06-01: qty 55

## 2016-06-01 MED ORDER — PALONOSETRON HCL INJECTION 0.25 MG/5ML
INTRAVENOUS | Status: AC
  Filled 2016-06-01: qty 5

## 2016-06-01 MED ORDER — PEGFILGRASTIM 6 MG/0.6ML ~~LOC~~ PSKT
6.0000 mg | PREFILLED_SYRINGE | Freq: Once | SUBCUTANEOUS | Status: AC
  Administered 2016-06-01: 6 mg via SUBCUTANEOUS
  Filled 2016-06-01: qty 0.6

## 2016-06-01 MED ORDER — PALONOSETRON HCL INJECTION 0.25 MG/5ML
0.2500 mg | Freq: Once | INTRAVENOUS | Status: AC
  Administered 2016-06-01: 0.25 mg via INTRAVENOUS

## 2016-06-01 MED ORDER — SODIUM CHLORIDE 0.9 % IV SOLN
Freq: Once | INTRAVENOUS | Status: AC
  Administered 2016-06-01: 10:00:00 via INTRAVENOUS
  Filled 2016-06-01: qty 5

## 2016-06-01 MED ORDER — SODIUM CHLORIDE 0.9 % IV SOLN
Freq: Once | INTRAVENOUS | Status: AC
  Administered 2016-06-01: 10:00:00 via INTRAVENOUS

## 2016-06-01 MED ORDER — SODIUM CHLORIDE 0.9% FLUSH
10.0000 mL | INTRAVENOUS | Status: DC | PRN
  Administered 2016-06-01: 10 mL
  Filled 2016-06-01: qty 10

## 2016-06-01 MED ORDER — HEPARIN SOD (PORK) LOCK FLUSH 100 UNIT/ML IV SOLN
500.0000 [IU] | Freq: Once | INTRAVENOUS | Status: AC | PRN
  Administered 2016-06-01: 500 [IU]
  Filled 2016-06-01: qty 5

## 2016-06-01 NOTE — Patient Instructions (Signed)
[facility] Discharge Instructions for Patients Receiving Chemotherapy  Today you received the following chemotherapy agents Adriamycin/Cytoxan To help prevent nausea and vomiting after your treatment, we encourage you to take your nausea medication as prescribed.   If you develop nausea and vomiting that is not controlled by your nausea medication, call the clinic.   BELOW ARE SYMPTOMS THAT SHOULD BE REPORTED IMMEDIATELY:  *FEVER GREATER THAN 100.5 F  *CHILLS WITH OR WITHOUT FEVER  NAUSEA AND VOMITING THAT IS NOT CONTROLLED WITH YOUR NAUSEA MEDICATION  *UNUSUAL SHORTNESS OF BREATH  *UNUSUAL BRUISING OR BLEEDING  TENDERNESS IN MOUTH AND THROAT WITH OR WITHOUT PRESENCE OF ULCERS  *URINARY PROBLEMS  *BOWEL PROBLEMS  UNUSUAL RASH Items with * indicate a potential emergency and should be followed up as soon as possible.  Feel free to call the clinic you have any questions or concerns. The clinic phone number is [phone].  Please show the [place] at check-in to the Emergency Department and triage nurse.   Doxorubicin injection (Adriamycin) What is this medicine? DOXORUBICIN (dox oh ROO bi sin) is a chemotherapy drug. It is used to treat many kinds of cancer like leukemia, lymphoma, neuroblastoma, sarcoma, and Wilms' tumor. It is also used to treat bladder cancer, breast cancer, lung cancer, ovarian cancer, stomach cancer, and thyroid cancer. This medicine may be used for other purposes; ask your health care provider or pharmacist if you have questions. COMMON BRAND NAME(S): Adriamycin, Adriamycin PFS, Adriamycin RDF, Rubex What should I tell my health care provider before I take this medicine? They need to know if you have any of these conditions: -heart disease -history of low blood counts caused by a medicine -liver disease -recent or ongoing radiation therapy -an unusual or allergic reaction to doxorubicin, other chemotherapy agents, other  medicines, foods, dyes, or preservatives -pregnant or trying to get pregnant -breast-feeding How should I use this medicine? This drug is given as an infusion into a vein. It is administered in a hospital or clinic by a specially trained health care professional. If you have pain, swelling, burning or any unusual feeling around the site of your injection, tell your health care professional right away. Talk to your pediatrician regarding the use of this medicine in children. Special care may be needed. Overdosage: If you think you have taken too much of this medicine contact a poison control center or emergency room at once. NOTE: This medicine is only for you. Do not share this medicine with others. What if I miss a dose? It is important not to miss your dose. Call your doctor or health care professional if you are unable to keep an appointment. What may interact with this medicine? This medicine may interact with the following medications: -6-mercaptopurine -paclitaxel -phenytoin -St. John's Wort -trastuzumab -verapamil This list may not describe all possible interactions. Give your health care provider a list of all the medicines, herbs, non-prescription drugs, or dietary supplements you use. Also tell them if you smoke, drink alcohol, or use illegal drugs. Some items may interact with your medicine. What should I watch for while using this medicine? This drug may make you feel generally unwell. This is not uncommon, as chemotherapy can affect healthy cells as well as cancer cells. Report any side effects. Continue your course of treatment even though you feel ill unless your doctor tells you to stop. There is a maximum amount of this medicine you should receive throughout your life. The amount depends on the medical  condition being treated and your overall health. Your doctor will watch how much of this medicine you receive in your lifetime. Tell your doctor if you have taken this medicine  before. You may need blood work done while you are taking this medicine. Your urine may turn red for a few days after your dose. This is not blood. If your urine is dark or brown, call your doctor. In some cases, you may be given additional medicines to help with side effects. Follow all directions for their use. Call your doctor or health care professional for advice if you get a fever, chills or sore throat, or other symptoms of a cold or flu. Do not treat yourself. This drug decreases your body's ability to fight infections. Try to avoid being around people who are sick. This medicine may increase your risk to bruise or bleed. Call your doctor or health care professional if you notice any unusual bleeding. Talk to your doctor about your risk of cancer. You may be more at risk for certain types of cancers if you take this medicine. Do not become pregnant while taking this medicine or for 6 months after stopping it. Women should inform their doctor if they wish to become pregnant or think they might be pregnant. Men should not father a child while taking this medicine and for 6 months after stopping it. There is a potential for serious side effects to an unborn child. Talk to your health care professional or pharmacist for more information. Do not breast-feed an infant while taking this medicine. This medicine has caused ovarian failure in some women and reduced sperm counts in some men This medicine may interfere with the ability to have a child. Talk with your doctor or health care professional if you are concerned about your fertility. What side effects may I notice from receiving this medicine? Side effects that you should report to your doctor or health care professional as soon as possible: -allergic reactions like skin rash, itching or hives, swelling of the face, lips, or tongue -breathing problems -chest pain -fast or irregular heartbeat -low blood counts - this medicine may decrease the  number of white blood cells, red blood cells and platelets. You may be at increased risk for infections and bleeding. -pain, redness, or irritation at site where injected -signs of infection - fever or chills, cough, sore throat, pain or difficulty passing urine -signs of decreased platelets or bleeding - bruising, pinpoint red spots on the skin, black, tarry stools, blood in the urine -swelling of the ankles, feet, hands -tiredness -weakness Side effects that usually do not require medical attention (report to your doctor or health care professional if they continue or are bothersome): -diarrhea -hair loss -mouth sores -nail discoloration or damage -nausea -red colored urine -vomiting This list may not describe all possible side effects. Call your doctor for medical advice about side effects. You may report side effects to FDA at 1-800-FDA-1088. Where should I keep my medicine? This drug is given in a hospital or clinic and will not be stored at home. NOTE: This sheet is a summary. It may not cover all possible information. If you have questions about this medicine, talk to your doctor, pharmacist, or health care provider.  2018 Elsevier/Gold Standard (2015-04-13 11:28:51)   Cyclophosphamide injection (Cytoxan) What is this medicine? CYCLOPHOSPHAMIDE (sye kloe FOSS fa mide) is a chemotherapy drug. It slows the growth of cancer cells. This medicine is used to treat many types of cancer like lymphoma,  myeloma, leukemia, breast cancer, and ovarian cancer, to name a few. This medicine may be used for other purposes; ask your health care provider or pharmacist if you have questions. COMMON BRAND NAME(S): Cytoxan, Neosar What should I tell my health care provider before I take this medicine? They need to know if you have any of these conditions: -blood disorders -history of other chemotherapy -infection -kidney disease -liver disease -recent or ongoing radiation therapy -tumors in the  bone marrow -an unusual or allergic reaction to cyclophosphamide, other chemotherapy, other medicines, foods, dyes, or preservatives -pregnant or trying to get pregnant -breast-feeding How should I use this medicine? This drug is usually given as an injection into a vein or muscle or by infusion into a vein. It is administered in a hospital or clinic by a specially trained health care professional. Talk to your pediatrician regarding the use of this medicine in children. Special care may be needed. Overdosage: If you think you have taken too much of this medicine contact a poison control center or emergency room at once. NOTE: This medicine is only for you. Do not share this medicine with others. What if I miss a dose? It is important not to miss your dose. Call your doctor or health care professional if you are unable to keep an appointment. What may interact with this medicine? This medicine may interact with the following medications: -amiodarone -amphotericin B -azathioprine -certain antiviral medicines for HIV or AIDS such as protease inhibitors (e.g., indinavir, ritonavir) and zidovudine -certain blood pressure medications such as benazepril, captopril, enalapril, fosinopril, lisinopril, moexipril, monopril, perindopril, quinapril, ramipril, trandolapril -certain cancer medications such as anthracyclines (e.g., daunorubicin, doxorubicin), busulfan, cytarabine, paclitaxel, pentostatin, tamoxifen, trastuzumab -certain diuretics such as chlorothiazide, chlorthalidone, hydrochlorothiazide, indapamide, metolazone -certain medicines that treat or prevent blood clots like warfarin -certain muscle relaxants such as succinylcholine -cyclosporine -etanercept -indomethacin -medicines to increase blood counts like filgrastim, pegfilgrastim, sargramostim -medicines used as general anesthesia -metronidazole -natalizumab This list may not describe all possible interactions. Give your health care  provider a list of all the medicines, herbs, non-prescription drugs, or dietary supplements you use. Also tell them if you smoke, drink alcohol, or use illegal drugs. Some items may interact with your medicine. What should I watch for while using this medicine? Visit your doctor for checks on your progress. This drug may make you feel generally unwell. This is not uncommon, as chemotherapy can affect healthy cells as well as cancer cells. Report any side effects. Continue your course of treatment even though you feel ill unless your doctor tells you to stop. Drink water or other fluids as directed. Urinate often, even at night. In some cases, you may be given additional medicines to help with side effects. Follow all directions for their use. Call your doctor or health care professional for advice if you get a fever, chills or sore throat, or other symptoms of a cold or flu. Do not treat yourself. This drug decreases your body's ability to fight infections. Try to avoid being around people who are sick. This medicine may increase your risk to bruise or bleed. Call your doctor or health care professional if you notice any unusual bleeding. Be careful brushing and flossing your teeth or using a toothpick because you may get an infection or bleed more easily. If you have any dental work done, tell your dentist you are receiving this medicine. You may get drowsy or dizzy. Do not drive, use machinery, or do anything that needs mental alertness  until you know how this medicine affects you. Do not become pregnant while taking this medicine or for 1 year after stopping it. Women should inform their doctor if they wish to become pregnant or think they might be pregnant. Men should not father a child while taking this medicine and for 4 months after stopping it. There is a potential for serious side effects to an unborn child. Talk to your health care professional or pharmacist for more information. Do not  breast-feed an infant while taking this medicine. This medicine may interfere with the ability to have a child. This medicine has caused ovarian failure in some women. This medicine has caused reduced sperm counts in some men. You should talk with your doctor or health care professional if you are concerned about your fertility. If you are going to have surgery, tell your doctor or health care professional that you have taken this medicine. What side effects may I notice from receiving this medicine? Side effects that you should report to your doctor or health care professional as soon as possible: -allergic reactions like skin rash, itching or hives, swelling of the face, lips, or tongue -low blood counts - this medicine may decrease the number of white blood cells, red blood cells and platelets. You may be at increased risk for infections and bleeding. -signs of infection - fever or chills, cough, sore throat, pain or difficulty passing urine -signs of decreased platelets or bleeding - bruising, pinpoint red spots on the skin, black, tarry stools, blood in the urine -signs of decreased red blood cells - unusually weak or tired, fainting spells, lightheadedness -breathing problems -dark urine -dizziness -palpitations -swelling of the ankles, feet, hands -trouble passing urine or change in the amount of urine -weight gain -yellowing of the eyes or skin Side effects that usually do not require medical attention (report to your doctor or health care professional if they continue or are bothersome): -changes in nail or skin color -hair loss -missed menstrual periods -mouth sores -nausea, vomiting This list may not describe all possible side effects. Call your doctor for medical advice about side effects. You may report side effects to FDA at 1-800-FDA-1088. Where should I keep my medicine? This drug is given in a hospital or clinic and will not be stored at home. NOTE: This sheet is a  summary. It may not cover all possible information. If you have questions about this medicine, talk to your doctor, pharmacist, or health care provider.  2018 Elsevier/Gold Standard (2011-12-30 16:22:58)   Pegfilgrastim injection (Neulasta) What is this medicine? PEGFILGRASTIM (PEG fil gra stim) is a long-acting granulocyte colony-stimulating factor that stimulates the growth of neutrophils, a type of white blood cell important in the body's fight against infection. It is used to reduce the incidence of fever and infection in patients with certain types of cancer who are receiving chemotherapy that affects the bone marrow, and to increase survival after being exposed to high doses of radiation. This medicine may be used for other purposes; ask your health care provider or pharmacist if you have questions. COMMON BRAND NAME(S): Neulasta What should I tell my health care provider before I take this medicine? They need to know if you have any of these conditions: -kidney disease -latex allergy -ongoing radiation therapy -sickle cell disease -skin reactions to acrylic adhesives (On-Body Injector only) -an unusual or allergic reaction to pegfilgrastim, filgrastim, other medicines, foods, dyes, or preservatives -pregnant or trying to get pregnant -breast-feeding How should I use this  medicine? This medicine is for injection under the skin. If you get this medicine at home, you will be taught how to prepare and give the pre-filled syringe or how to use the On-body Injector. Refer to the patient Instructions for Use for detailed instructions. Use exactly as directed. Tell your healthcare provider immediately if you suspect that the On-body Injector may not have performed as intended or if you suspect the use of the On-body Injector resulted in a missed or partial dose. It is important that you put your used needles and syringes in a special sharps container. Do not put them in a trash can. If you do  not have a sharps container, call your pharmacist or healthcare provider to get one. Talk to your pediatrician regarding the use of this medicine in children. While this drug may be prescribed for selected conditions, precautions do apply. Overdosage: If you think you have taken too much of this medicine contact a poison control center or emergency room at once. NOTE: This medicine is only for you. Do not share this medicine with others. What if I miss a dose? It is important not to miss your dose. Call your doctor or health care professional if you miss your dose. If you miss a dose due to an On-body Injector failure or leakage, a new dose should be administered as soon as possible using a single prefilled syringe for manual use. What may interact with this medicine? Interactions have not been studied. Give your health care provider a list of all the medicines, herbs, non-prescription drugs, or dietary supplements you use. Also tell them if you smoke, drink alcohol, or use illegal drugs. Some items may interact with your medicine. This list may not describe all possible interactions. Give your health care provider a list of all the medicines, herbs, non-prescription drugs, or dietary supplements you use. Also tell them if you smoke, drink alcohol, or use illegal drugs. Some items may interact with your medicine. What should I watch for while using this medicine? You may need blood work done while you are taking this medicine. If you are going to need a MRI, CT scan, or other procedure, tell your doctor that you are using this medicine (On-Body Injector only). What side effects may I notice from receiving this medicine? Side effects that you should report to your doctor or health care professional as soon as possible: -allergic reactions like skin rash, itching or hives, swelling of the face, lips, or tongue -dizziness -fever -pain, redness, or irritation at site where injected -pinpoint red spots  on the skin -red or dark-brown urine -shortness of breath or breathing problems -stomach or side pain, or pain at the shoulder -swelling -tiredness -trouble passing urine or change in the amount of urine Side effects that usually do not require medical attention (report to your doctor or health care professional if they continue or are bothersome): -bone pain -muscle pain This list may not describe all possible side effects. Call your doctor for medical advice about side effects. You may report side effects to FDA at 1-800-FDA-1088. Where should I keep my medicine? Keep out of the reach of children. Store pre-filled syringes in a refrigerator between 2 and 8 degrees C (36 and 46 degrees F). Do not freeze. Keep in carton to protect from light. Throw away this medicine if it is left out of the refrigerator for more than 48 hours. Throw away any unused medicine after the expiration date. NOTE: This sheet is a summary.  It may not cover all possible information. If you have questions about this medicine, talk to your doctor, pharmacist, or health care provider.  2018 Elsevier/Gold Standard (2016-02-11 12:58:03)

## 2016-06-01 NOTE — Progress Notes (Signed)
Adriamycin administered through a free dripping normal saline line. Positive blood return noted before, every 5 mL during and after Adriamycin administration. Patient tolerated well.

## 2016-06-02 ENCOUNTER — Ambulatory Visit
Admission: RE | Admit: 2016-06-02 | Discharge: 2016-06-02 | Disposition: A | Discharge status: Home / Self Care | Payer: Medicare Other | Source: Ambulatory Visit | Attending: Oncology | Admitting: Oncology

## 2016-06-02 ENCOUNTER — Other Ambulatory Visit: Payer: Self-pay | Admitting: Oncology

## 2016-06-02 ENCOUNTER — Telehealth: Payer: Self-pay | Admitting: *Deleted

## 2016-06-02 DIAGNOSIS — C50312 Malignant neoplasm of lower-inner quadrant of left female breast: Secondary | ICD-10-CM

## 2016-06-02 DIAGNOSIS — Z170 Estrogen receptor positive status [ER+]: Principal | ICD-10-CM

## 2016-06-02 DIAGNOSIS — N6324 Unspecified lump in the left breast, lower inner quadrant: Secondary | ICD-10-CM | POA: Diagnosis not present

## 2016-06-02 DIAGNOSIS — N6320 Unspecified lump in the left breast, unspecified quadrant: Secondary | ICD-10-CM

## 2016-06-02 NOTE — Telephone Encounter (Signed)
Pt return call, relate doing well. No c/o nausea or vomiting. Encourage pt to call with needs or questions. Received verbal understanding.

## 2016-06-02 NOTE — Telephone Encounter (Signed)
  Oncology Nurse Navigator Documentation  Navigator Location: [facility] (06/02/16 1200)   )Navigator Encounter Type: Telephone (06/02/16 1200) Telephone: [place] Call (06/02/16 1200)  left vm to assess needs after 1st chemo. Contact information provided.                 Patient Visit Type: MedOnc (06/02/16 1200) Treatment Phase: First Chemo Tx (06/01/16) (06/02/16 1200)                            Time Spent with Patient: 15 (06/02/16 1200)

## 2016-06-08 ENCOUNTER — Other Ambulatory Visit (HOSPITAL_BASED_OUTPATIENT_CLINIC_OR_DEPARTMENT_OTHER): Payer: Medicare Other

## 2016-06-08 ENCOUNTER — Ambulatory Visit (HOSPITAL_BASED_OUTPATIENT_CLINIC_OR_DEPARTMENT_OTHER): Payer: Medicare Other | Admitting: Oncology

## 2016-06-08 VITALS — BP 119/64 | HR 87 | Temp 98.0°F | Resp 18 | Ht 65.0 in | Wt 151.5 lb

## 2016-06-08 DIAGNOSIS — C50312 Malignant neoplasm of lower-inner quadrant of left female breast: Secondary | ICD-10-CM | POA: Diagnosis not present

## 2016-06-08 DIAGNOSIS — R197 Diarrhea, unspecified: Secondary | ICD-10-CM

## 2016-06-08 DIAGNOSIS — Z170 Estrogen receptor positive status [ER+]: Secondary | ICD-10-CM

## 2016-06-08 LAB — COMPREHENSIVE METABOLIC PANEL
ALT: 31 U/L (ref 0–55)
ANION GAP: 11 meq/L (ref 3–11)
AST: 8 U/L (ref 5–34)
Albumin: 3.6 g/dL (ref 3.5–5.0)
Alkaline Phosphatase: 79 U/L (ref 40–150)
BILIRUBIN TOTAL: 0.61 mg/dL (ref 0.20–1.20)
BUN: 12.4 mg/dL (ref 7.0–26.0)
CALCIUM: 8 mg/dL — AB (ref 8.4–10.4)
CO2: 20 mEq/L — ABNORMAL LOW (ref 22–29)
CREATININE: 0.8 mg/dL (ref 0.6–1.1)
Chloride: 107 mEq/L (ref 98–109)
EGFR: >90 mL/min/{1.73_m2} (ref >90)
Glucose: 112 mg/dl (ref 70–140)
Potassium: 3.1 mEq/L — ABNORMAL LOW (ref 3.5–5.1)
Sodium: 138 mEq/L (ref 136–145)
TOTAL PROTEIN: 6.6 g/dL (ref 6.4–8.3)

## 2016-06-08 LAB — CBC WITH DIFFERENTIAL/PLATELET
BASO%: 0.6 % (ref 0.0–2.0)
Basophils Absolute: 0 10*3/uL (ref 0.0–0.1)
EOS ABS: 0.1 10*3/uL (ref 0.0–0.5)
EOS%: 2.9 % (ref 0.0–7.0)
HEMATOCRIT: 33.1 % — AB (ref 34.8–46.6)
HEMOGLOBIN: 10.6 g/dL — AB (ref 11.6–15.9)
LYMPH%: 20.7 % (ref 14.0–49.7)
MCH: 25.6 pg (ref 25.1–34.0)
MCHC: 32 g/dL (ref 31.5–36.0)
MCV: 80 fL (ref 79.5–101.0)
MONO#: 0.2 10*3/uL (ref 0.1–0.9)
MONO%: 10.3 % (ref 0.0–14.0)
NEUT%: 65.5 % (ref 38.4–76.8)
NEUTROS ABS: 1.1 10*3/uL — AB (ref 1.5–6.5)
NRBC: 0 % (ref 0–0)
PLATELETS: 124 10*3/uL — AB (ref 145–400)
RBC: 4.14 10*6/uL (ref 3.70–5.45)
RDW: 13.5 % (ref 11.2–14.5)
WBC: 1.7 10*3/uL — AB (ref 3.9–10.3)
lymph#: 0.4 10*3/uL — ABNORMAL LOW (ref 0.9–3.3)

## 2016-06-08 MED ORDER — CHOLESTYRAMINE 4 G PO PACK: 4.0000 g | PACK | Freq: Three times a day (TID) | ORAL | 12 refills | Status: DC

## 2016-06-08 NOTE — Progress Notes (Signed)
[place]  [phone] [phone]     ID: LAJOY VANAMBURG DOB: [birth year]  MR#: [id number]  TMH#:[id number]  Patient Care Team: [name], MD as PCP - General (Obstetrics and Gynecology) [name], MD as Consulting Physician (Nephrology) [name], MD as Consulting Physician (Obstetrics and Gynecology) [name], MD as Consulting Physician (General Surgery) [name], MD as Consulting Physician (Oncology) [name], MD as Consulting Physician (Radiation Oncology) [name], MD as Referring Physician (Surgery) [name], DO (Internal Medicine) [name], MD OTHER MD:  CHIEF COMPLAINT: Estrogen receptor positive breast cancer  CURRENT TREATMENT: Neoadjuvant chemotherapy  INTERVAL HISTORY: Erika Freeman returns today for follow-up of her estrogen receptor positive breast cancer accompanied by her husband Erika Freeman. The patient is receiving doxorubicin and cyclophosphamide in dose dense fashion and today is day 8 cycle 1 of 4 cycles planned, to be followed by weekly Abraxane.  Since her last visit here she underwent biopsy of the satellite lesion of the 6:00 radiant of the left breast, which showed (SAA 18-03/09/2007) invasive ductal carcinoma, grade 3, estrogen receptor weakly positive at 20%, progesterone receptor negative, with an MIB-1 of 70%, and HER-2 nonamplified, the signals ratio being 1.39 and the number per cell 2.15.   REVIEW OF SYSTEMS: She did remarkably well with her first cycle of chemotherapy. She received Neulasta day 2 with no bony pain at all. However by day 4, Saturday, she developed diarrhea which continued for 4 days. She tried Imodium but it nauseated her. The diarrhea only began to ease today. She has only had 2 bowel movements today, more formed. She tells me she was able to keep herself fairly well hydrated. Aside from that issue, she has insomnia, which is not a new problem, but no  mouth sores, no thrush, and no significant fatigue above baseline. She tells me her port worked okay. A detailed review of systems today was otherwise stable  BREAST CANCER HISTORY: From the original intake note:  "Erika Freeman" herself noted a mass in her left breast. She ignored it because she thought it was related to menstruation. However as it persisted through 1. She brought it to Dr. [name] attention and on 05/10/2016 she underwent left diagnostic mammography with tomography and [facility] The breast density was category C. In the left breast lower central area there was a new circumscribed mass. This was firm and fixed in the lower inner left breast at middle depth. Ultrasonography confirmed a 2.6 cm hypoechoic irregular mass at the 6:30 o'clock radiant 3 cm from the nipple. There was also a 0.5 cm nodule 1.4 cm medial to the mass just described.  Biopsy of the left breast mass in question 05/10/2016 showed([id number]) invasive ductal carcinoma, grade 3, estrogen receptor 70% positive with weak staining intensity, progesterone receptor negative, with no HER-2 amplification, the signals ratio being 1.52 and the number per cell 2.35. The proliferation marker was 90%.  Her subsequent history is as detailed below  OF NOTE: The patient has a history of focal segmental glomerular sclerosis with end-stage renal disease and is status post unrelated donor renal transplant 04/15/2013 as part of appeared donor exchange with her husband donating one kidney, the other harvested from a 55 year old [place] man. She continues on immunosuppression with mycophenolate and tacrolimus.  PAST MEDICAL HISTORY: Past Medical History:  Diagnosis Date  . Anemia   . Cancer [facility])    breast cancer  . Chills   .  Chronic kidney disease   . Cough   . Fibroid tumor   . GERD (gastroesophageal reflux disease)   . Heart murmur   . Hypertension    DX  30 YRS AGO  . Umbilical hernia s/p  primary repair 01/10/2012 01/24/2012  . Vomiting     PAST SURGICAL HISTORY: Past Surgical History:  Procedure Laterality Date  . BREAST SURGERY  2012   left - fibroadenoma  . CAPD INSERTION  01/10/2012   Procedure: LAPAROSCOPIC INSERTION CONTINUOUS AMBULATORY PERITONEAL DIALYSIS  (CAPD) CATHETER;  Surgeon: Adin Hector, MD;  Location: [place];  Service: General;  Laterality: N/A;  LAPAROSCOPIC CAPD CATHETER PLACEMENT OMENTOPEXY  . CESAREAN SECTION  1996  . DILATION AND CURETTAGE OF UTERUS    . KIDNEY TRANSPLANT    . PARATHYROIDECTOMY  2000 - approximate  . PORTACATH PLACEMENT Right 05/24/2016   Procedure: INSERTION PORT-A-CATH WITH [place];  Surgeon: [name], MD;  Location: [place];  Service: General;  Laterality: Right;  . RENAL BIOPSY  2011  . UMBILICAL HERNIA REPAIR  01/10/2012   Procedure: HERNIA REPAIR UMBILICAL ADULT;  Surgeon: Adin Hector, MD;  Location: [place];  Service: General;  Laterality: N/A;    FAMILY HISTORY Family History  Problem Relation Age of Onset  . Cancer Maternal Uncle     lung  . Cancer Cousin     breast  . Cancer Cousin     breast  The patient has very little information regarding her father. Her mother is living, 55 years old as of March 2018. The patient had one full brother, diagnosed with prostate cancer in his 70s. The patient has 2 half-brothers and 1 half-sister. There is no history of breast or ovarian cancer in the family to the patient's knowledge   GYNECOLOGIC HISTORY:  No LMP recorded.  Menarche age 49, first live birth age 28, the patient is GX P3. Her periods are regular, the last 5 or 6 days, with no heavy days. 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 Her husband white works at home [employer]. Son Erika Freeman lives in [place] and as Freight forwarder of a Therapist, art. Son Erika Freeman lives in [place] and is a Air traffic controller.  Son Erika Freeman lives in [place] and is studying based trombone. The patient has 2 grandchildren. She attends a local [employer]  not in place    HEALTH MAINTENANCE: Social History  Substance Use Topics  . Smoking status: Never Smoker  . Smokeless tobacco: Never Used  . Alcohol use No     Colonoscopy:  PAP:  Bone density:   Allergies  Allergen Reactions  . No Known Allergies     Current Outpatient Prescriptions  Medication Sig Dispense Refill  . amLODipine (NORVASC) 5 MG tablet Take 5 mg by mouth daily.     [name] aspirin 81 MG tablet Take 81 mg by mouth daily.    [name] dexamethasone (DECADRON) 4 MG tablet Take 2 tablets by mouth once a day on the day after chemotherapy and then take 2 tablets two times a day for 2 days. Take with food. 30 tablet 1  . Iron, Ferrous Sulfate, 142 (45 Fe) MG TBCR Take 1 tablet by mouth daily.    [name] lidocaine-prilocaine (EMLA) cream Apply to affected area once 30 g 3  . loratadine (CLARITIN) 10 MG tablet Take 1 tablet (10  mg total) by mouth daily. 100 tablet 0  . LORazepam (ATIVAN) 0.5 MG tablet Take 1 tablet (0.5 mg total) by mouth at bedtime. 30 tablet 0  . lovastatin (MEVACOR) 20 MG tablet Take 20 mg by mouth every Monday, Wednesday, and Friday.     . magnesium (MAGTAB) 84 MG (7MEQ) TBCR SR tablet Take 168-252 mg by mouth See admin instructions. 2 tabs at [facility], and 3 tabs at 1700    . predniSONE 5 MG/5ML solution Take 5 mg by mouth daily with breakfast.    . prochlorperazine (COMPAZINE) 10 MG tablet TAKE 1 TABLET BY MOUTH EVERY 6 HOURS AS NEEDED FOR NAUSEA OR VOMITING 385 tablet 1  . SENSIPAR 60 MG tablet Take 60 mg by mouth daily.     . tacrolimus (PROGRAF) 1 MG capsule Take 3 mg by mouth 2 (two) times daily.     No current facility-administered medications for this visit.     OBJECTIVE: Middle-aged African-American woman In no acute distress   Vitals:   06/08/16 1547  BP: 119/64  Pulse: 87  Resp: 18    Temp: 98 F (36.7 C)     Body mass index is 25.21 kg/m.    ECOG FS:1 - Symptomatic but completely ambulatory   Sclerae unicteric, EOMs intact Oropharynx clear and moist No cervical or supraclavicular adenopathy Lungs no rales or rhonchi Heart regular rate and rhythm Abd soft, nontender, positive bowel sounds MSK no focal spinal tenderness, no upper extremity lymphedema Neuro: nonfocal, well oriented, appropriate affect Breasts: Deferred   LAB RESULTS:  CMP     Component Value Date/Time   NA 138 06/08/2016 1513   K 3.1 (L) 06/08/2016 1513   CL 108 05/24/2016 1104   CO2 20 (L) 06/08/2016 1513   GLUCOSE 112 06/08/2016 1513   BUN 12.4 06/08/2016 1513   CREATININE 0.8 06/08/2016 1513   CALCIUM 8.0 (L) 06/08/2016 1513   PROT 6.6 06/08/2016 1513   ALBUMIN 3.6 06/08/2016 1513   AST 8 06/08/2016 1513   ALT 31 06/08/2016 1513   ALKPHOS 79 06/08/2016 1513   BILITOT 0.61 06/08/2016 1513   GFRNONAA >60 05/24/2016 1104   GFRAA >60 05/24/2016 1104    No results found for: TOTALPROTELP, ALBUMINELP, A1GS, A2GS, BETS, BETA2SER, GAMS, MSPIKE, SPEI  No results found for: KPAFRELGTCHN, LAMBDASER, KAPLAMBRATIO  Lab Results  Component Value Date   WBC 1.7 (L) 06/08/2016   NEUTROABS 1.1 (L) 06/08/2016   HGB 10.6 (L) 06/08/2016   HCT 33.1 (L) 06/08/2016   MCV 80.0 06/08/2016   PLT 124 (L) 06/08/2016      Chemistry      Component Value Date/Time   NA 138 06/08/2016 1513   K 3.1 (L) 06/08/2016 1513   CL 108 05/24/2016 1104   CO2 20 (L) 06/08/2016 1513   BUN 12.4 06/08/2016 1513   CREATININE 0.8 06/08/2016 1513      Component Value Date/Time   CALCIUM 8.0 (L) 06/08/2016 1513   ALKPHOS 79 06/08/2016 1513   AST 8 06/08/2016 1513   ALT 31 06/08/2016 1513   BILITOT 0.61 06/08/2016 1513       No results found for: LABCA2  No components found for: NATFTD322  No results for input(s): INR in the last 168 hours.  Urinalysis    Component Value Date/Time   COLORURINE  YELLOW 11/24/2006 1539   APPEARANCEUR CLEAR 11/24/2006 1539   LABSPEC 1.018 11/24/2006 1539   PHURINE 6.0 11/24/2006 1539   [place] 11/24/2006 1539  HGBUR SMALL (A) 11/24/2006 1539   BILIRUBINUR NEGATIVE 11/24/2006 1539   KETONESUR NEGATIVE 11/24/2006 1539   PROTEINUR 100 (A) 11/24/2006 1539   UROBILINOGEN 1.0 11/24/2006 1539   NITRITE NEGATIVE 11/24/2006 1539   LEUKOCYTESUR MODERATE (A) 11/24/2006 1539     STUDIES: Dg Chest Port 1 View  Result Date: 05/24/2016 CLINICAL DATA:  Status post Port-A-Cath placement today. EXAM: PORTABLE CHEST 1 VIEW COMPARISON:  PA and lateral chest 01/02/2012. FINDINGS: Right IJ approach Port-A-Cath tip projects in the mid to lower superior vena cava. No pneumothorax. Lungs are clear. Heart size is upper normal. IMPRESSION: Port-A-Cath tip projects in the mid to lower superior vena cava. Negative for pneumothorax or acute disease. Electronically Signed   By: Inge Rise M.D.   On: 05/24/2016 15:03   Dg Fluoro Guide Cv Line-no Report  Result Date: 05/24/2016 Fluoroscopy was utilized by the requesting physician.  No radiographic interpretation.   US Breast Ltd Uni Left Inc Axilla  Addendum Date: 05/13/2016   ADDENDUM REPORT: 05/13/2016 12:39 ADDENDUM: Ultrasound evaluation of the left axilla demonstrates no evidence of lymphadenopathy. Electronically Signed   By: Fidela Salisbury M.D.   On: 05/13/2016 12:39   Result Date: 05/13/2016 CLINICAL DATA:  Left lower central breast area of palpable concern. Patient has a history of benign excisional biopsy of the left inferior breast demonstrating fibroadenoma. Patient has a functional transplanted kidney. EXAM: 2D DIGITAL DIAGNOSTIC LEFT MAMMOGRAM WITH CAD AND ADJUNCT TOMO ULTRASOUND LEFT BREAST COMPARISON:  Previous exam(s). ACR Breast Density Category c: The breast tissue is heterogeneously dense, which may obscure small masses. FINDINGS: Mammographically, there is a new circumscribed mass in the  left lower central breast, posterior depth, likely accounting for patient's area of palpable concern. Postsurgical architectural distortion is seen in the subareolar inferior left breast, from prior benign excisional biopsy. Mammographic images were processed with CAD. On physical exam, there is a firm fixed palpable mass in the lower slightly inner left breast, middle depth. Targeted ultrasound is performed, showing left breast 630 o'clock 3 cm from the nipple hypoechoic somewhat irregular mass measuring 1.8 x 1.5 x 2.6 cm. 1.4 cm medial to it, there is a 5 mm satellite nodule versus area of postsurgical scarring. IMPRESSION: Left breast 630 o'clock palpable suspicious mass, for which ultrasound-guided core needle biopsy is recommended. 5 mm satellite nodule versus area of postsurgical scarring, 1.4 cm medial to the mass. Even if this represents a satellite nodule, given its close proximity, it will likely be included during resection of the index mass. RECOMMENDATION: Ultrasound-guided core needle biopsy of the left breast. I have discussed the findings and recommendations with the patient. Results were also provided in writing at the conclusion of the visit. If applicable, a reminder letter will be sent to the patient regarding the next appointment. BI-RADS CATEGORY  4: Suspicious. Electronically Signed: By: Fidela Salisbury M.D. On: 05/10/2016 15:18   Mm Diag Breast Tomo Uni Left  Addendum Date: 05/13/2016   ADDENDUM REPORT: 05/13/2016 12:39 ADDENDUM: Ultrasound evaluation of the left axilla demonstrates no evidence of lymphadenopathy. Electronically Signed   By: Fidela Salisbury M.D.   On: 05/13/2016 12:39   Result Date: 05/13/2016 CLINICAL DATA:  Left lower central breast area of palpable concern. Patient has a history of benign excisional biopsy of the left inferior breast demonstrating fibroadenoma. Patient has a functional transplanted kidney. EXAM: 2D DIGITAL DIAGNOSTIC LEFT MAMMOGRAM WITH CAD  AND ADJUNCT TOMO ULTRASOUND LEFT BREAST COMPARISON:  Previous exam(s). ACR Breast Density Category c:  The breast tissue is heterogeneously dense, which may obscure small masses. FINDINGS: Mammographically, there is a new circumscribed mass in the left lower central breast, posterior depth, likely accounting for patient's area of palpable concern. Postsurgical architectural distortion is seen in the subareolar inferior left breast, from prior benign excisional biopsy. Mammographic images were processed with CAD. On physical exam, there is a firm fixed palpable mass in the lower slightly inner left breast, middle depth. Targeted ultrasound is performed, showing left breast 630 o'clock 3 cm from the nipple hypoechoic somewhat irregular mass measuring 1.8 x 1.5 x 2.6 cm. 1.4 cm medial to it, there is a 5 mm satellite nodule versus area of postsurgical scarring. IMPRESSION: Left breast 630 o'clock palpable suspicious mass, for which ultrasound-guided core needle biopsy is recommended. 5 mm satellite nodule versus area of postsurgical scarring, 1.4 cm medial to the mass. Even if this represents a satellite nodule, given its close proximity, it will likely be included during resection of the index mass. RECOMMENDATION: Ultrasound-guided core needle biopsy of the left breast. I have discussed the findings and recommendations with the patient. Results were also provided in writing at the conclusion of the visit. If applicable, a reminder letter will be sent to the patient regarding the next appointment. BI-RADS CATEGORY  4: Suspicious. Electronically Signed: By: Fidela Salisbury M.D. On: 05/10/2016 15:18   Mm Clip Placement Left  Result Date: 06/02/2016 CLINICAL DATA:  Status post ultrasound-guided core needle biopsy of a 6 mm mass in the 6:30 o'clock position of the left breast. EXAM: DIAGNOSTIC LEFT MAMMOGRAM POST STEREOTACTIC BIOPSY COMPARISON:  Previous exam(s). FINDINGS: Mammographic images were obtained following  stereotactic guided biopsy of a 6 mm mass in the 6:30 o'clock position of the left breast. These demonstrate a coil shaped biopsy marker clip at the location of the biopsied mass. In the craniocaudal projection, this is located 1.8 cm anteromedial to the previously placed ribbon shaped biopsy marker clip within the biopsy-proven invasive ductal carcinoma in the 6:30 o'clock position of the breast. In the true lateral projection, this is located 1.3 cm anterior and inferior to the previously placed ribbon shaped biopsy marker clip. IMPRESSION: Appropriate clip deployment following left breast ultrasound-guided core needle biopsy. Final Assessment: Post Procedure Mammograms for Marker Placement Electronically Signed   By: Claudie Revering M.D.   On: 06/02/2016 13:53   Mm Clip Placement Left  Result Date: 05/10/2016 CLINICAL DATA:  Status post ultrasound-guided core needle biopsy of a 2.6 cm mass in the 6:30 o'clock position of the left breast. EXAM: DIAGNOSTIC LEFT MAMMOGRAM POST ULTRASOUND BIOPSY COMPARISON:  Previous exam(s). FINDINGS: Mammographic images were obtained following ultrasound guided biopsy of the recently demonstrated 2.6 cm mass in the 6:30 o'clock position of the left breast. These demonstrate a ribbon shaped biopsy marker clip within the biopsied mass. IMPRESSION: Appropriate clip deployment following left breast ultrasound-guided core needle biopsy. Final Assessment: Post Procedure Mammograms for Marker Placement Electronically Signed   By: Claudie Revering M.D.   On: 05/10/2016 16:17   [place] Lt Breast Bx W Loc Dev 1st Lesion Img Bx Spec US Guide  Result Date: 06/02/2016 CLINICAL DATA:  5 mm possible satellite nodule 1.4 cm medial to a recently biopsied invasive ductal carcinoma in the 6:30 o'clock position of the left breast. The patient started neoadjuvant chemotherapy yesterday. EXAM: LEFT BREAST ULTRASOUND GUIDED CORE NEEDLE BIOPSY COMPARISON:  Previous exam(s). FINDINGS: I met with the patient and  we discussed the procedure of ultrasound-guided biopsy, including benefits and  alternatives. We discussed the high likelihood of a successful procedure. We discussed the risks of the procedure, including infection, bleeding, tissue injury, clip migration, and inadequate sampling. Informed written consent was given. The usual time-out protocol was performed immediately prior to the procedure. Using sterile technique and 1% Lidocaine as local anesthetic, under direct ultrasound visualization, a 12 gauge spring-loaded device was used to perform biopsy of the recently demonstrated 5 mm mass in the 6:30 o'clock position of the left breast. Today, this mass measures 6 x 4 x 3 mm in maximum dimensions with a small bridging connection to the adjacent previously biopsied invasive ductal carcinoma. An inferior approach was used. At the conclusion of the procedure a coil shaped tissue marker clip was deployed into the biopsy cavity. Follow up 2 view mammogram was performed and dictated separately. IMPRESSION: Ultrasound guided biopsy of a 6 mm mass in the 6:30 o'clock position of the left breast, medial to the recently biopsied invasive duct carcinoma. No apparent complications. Electronically Signed   By: Claudie Revering M.D.   On: 06/02/2016 13:45   [place] Lt Breast Bx W Loc Dev 1st Lesion Img Bx Spec US Guide  Addendum Date: 05/12/2016   ADDENDUM REPORT: 05/12/2016 07:14 ADDENDUM: Pathology revealed grade III invasive ductal carcinoma in the left breast. This was found to be concordant by Dr. Claudie Revering. Pathology results were discussed with the patient by telephone. The patient reported doing well after the biopsy with tenderness at the site. Post biopsy instructions and care were reviewed and questions were answered. The patient was encouraged to call [facility] for any additional concerns. The patient was referred to the [facility] at the [facility] on May 18, 2016. Pathology results reported by Susa Raring RN, BSN on 05/12/2016. Electronically Signed   By: Claudie Revering M.D.   On: 05/12/2016 07:14   Result Date: 05/12/2016 CLINICAL DATA:  2.6 cm mass in the 6:30 o'clock position of the left breast with imaging features suspicious for malignancy. EXAM: ULTRASOUND GUIDED LEFT BREAST CORE NEEDLE BIOPSY COMPARISON:  Previous exam(s). FINDINGS: I met with the patient and we discussed the procedure of ultrasound-guided biopsy, including benefits and alternatives. We discussed the high likelihood of a successful procedure. We discussed the risks of the procedure, including infection, bleeding, tissue injury, clip migration, and inadequate sampling. Informed written consent was given. The usual time-out protocol was performed immediately prior to the procedure. Using sterile technique and 1% Lidocaine as local anesthetic, under direct ultrasound visualization, a 12 gauge spring-loaded device was used to perform biopsy of the 2.6 cm mass seen in the 6:30 o'clock position of the left breast at mammography and ultrasound earlier today using a caudal approach. At the conclusion of the procedure a ribbon shaped tissue marker clip was deployed into the biopsy cavity. Follow up 2 view mammogram was performed and dictated separately. IMPRESSION: Ultrasound guided biopsy of a 2.6 cm mass in the 6:30 o'clock position of the left breast. No apparent complications. Electronically Signed: By: Claudie Revering M.D. On: 05/10/2016 16:16    ELIGIBLE FOR AVAILABLE RESEARCH PROTOCOL: no  ASSESSMENT: 56 y.o. [place], [place] woman status post left breast lower inner quadrant biopsy 05/10/2016 for a clinical  T2 N0, prognostic stage IIB invaside ductal carcinoma, grade 3, weekly estrogen receptor positive, progesterone receptor negative, with no HER-2 amplification, and the MIB-1 at 90%  (a) biopsy of the 0.6 mm satellite at the 6:30 o'clock radiant  in the left breast 06/02/2016  showed invasive ductal carcinoma, grade 3, estrogen receptor 20% positive, with weak staining intensity, progesterone receptor negative with no HER-2 amplification, and an MIB-1 of 70% (identical to larger mass)   (1) neoadjuvant chemotherapy consisting of doxorubicin and cyclophosphamide in dose dense fashion 4 to started 06/01/2016, to be followed by Abraxane weekly 12  (2) definitive surgery to follow, with breast conservation preferred if feasible   (a) satellite lesion noted on [place] to be included in final surgery sample  (3) adjuvant radiation as appropriate  (4) anti-estrogens to follow the completion of local treatment  PLAN: I spent approximately 30 minutes with Erika Freeman with most of that time spent discussing her tolerance of chemotherapy.[name] did remarkably well with her first cycle of chemotherapy. Diarrhea is not a common side effect of this treatment and it could be there is some interaction with her other meds or that she simply did develop a mild enteritis that is not related to treatment. In any rate this appears to be clearing.  We discussed what to do in case of diarrhea and of course the first thing is hydration. I gave her the recipes for rehydration solution. We discussed the appropriate use of Imodium and I also put in a prescription for Questran for her. Hopefully this will not occur with the second cycle but if it does she should be able to deal with it better with these modifications in place  We also discussed the pathology from biopsy of her satellite lesion. She understands the phenotype is identical to that of the main lesion so we are simply dealing indeed with a satellite and not with a second primary.  She will be seeing her renal doctors at [place] tomorrow. That is very reassuring to her and to me.  She will return April 17 for cycle 2 of chemotherapy. She will see my 65 assistant that morning. She will return to see me a week later to make  sure she tolerated that well. She knows to call for any problems that may develop before her next visit here.  :[name], MD   06/08/2016 5:43 PM Medical Oncology and Hematology [facility] [place], [place] [address] Tel. [phone]    Fax. [phone]

## 2016-06-09 DIAGNOSIS — N051 Unspecified nephritic syndrome with focal and segmental glomerular lesions: Secondary | ICD-10-CM | POA: Diagnosis not present

## 2016-06-09 DIAGNOSIS — Z9889 Other specified postprocedural states: Secondary | ICD-10-CM | POA: Diagnosis not present

## 2016-06-09 DIAGNOSIS — Z940 Kidney transplant status: Secondary | ICD-10-CM | POA: Diagnosis not present

## 2016-06-09 DIAGNOSIS — Z7982 Long term (current) use of aspirin: Secondary | ICD-10-CM | POA: Diagnosis not present

## 2016-06-09 DIAGNOSIS — E212 Other hyperparathyroidism: Secondary | ICD-10-CM | POA: Diagnosis not present

## 2016-06-09 DIAGNOSIS — D899 Disorder involving the immune mechanism, unspecified: Secondary | ICD-10-CM | POA: Diagnosis not present

## 2016-06-09 DIAGNOSIS — E089 Diabetes mellitus due to underlying condition without complications: Secondary | ICD-10-CM | POA: Diagnosis not present

## 2016-06-09 DIAGNOSIS — C50919 Malignant neoplasm of unspecified site of unspecified female breast: Secondary | ICD-10-CM | POA: Diagnosis not present

## 2016-06-09 DIAGNOSIS — Z4822 Encounter for aftercare following kidney transplant: Secondary | ICD-10-CM | POA: Diagnosis not present

## 2016-06-09 DIAGNOSIS — Z79899 Other long term (current) drug therapy: Secondary | ICD-10-CM | POA: Diagnosis not present

## 2016-06-09 DIAGNOSIS — Z7952 Long term (current) use of systemic steroids: Secondary | ICD-10-CM | POA: Diagnosis not present

## 2016-06-09 DIAGNOSIS — N186 End stage renal disease: Secondary | ICD-10-CM | POA: Diagnosis not present

## 2016-06-09 DIAGNOSIS — Z992 Dependence on renal dialysis: Secondary | ICD-10-CM | POA: Diagnosis not present

## 2016-06-09 DIAGNOSIS — I151 Hypertension secondary to other renal disorders: Secondary | ICD-10-CM | POA: Diagnosis not present

## 2016-06-09 DIAGNOSIS — Z5181 Encounter for therapeutic drug level monitoring: Secondary | ICD-10-CM | POA: Diagnosis not present

## 2016-06-14 ENCOUNTER — Ambulatory Visit (HOSPITAL_BASED_OUTPATIENT_CLINIC_OR_DEPARTMENT_OTHER): Payer: Medicare Other | Admitting: Adult Health

## 2016-06-14 ENCOUNTER — Other Ambulatory Visit (HOSPITAL_BASED_OUTPATIENT_CLINIC_OR_DEPARTMENT_OTHER): Payer: Medicare Other

## 2016-06-14 ENCOUNTER — Other Ambulatory Visit: Payer: Self-pay | Admitting: Oncology

## 2016-06-14 ENCOUNTER — Ambulatory Visit (HOSPITAL_BASED_OUTPATIENT_CLINIC_OR_DEPARTMENT_OTHER): Payer: Medicare Other

## 2016-06-14 ENCOUNTER — Encounter: Payer: Self-pay | Admitting: Adult Health

## 2016-06-14 ENCOUNTER — Other Ambulatory Visit: Payer: Self-pay | Admitting: Hematology and Oncology

## 2016-06-14 ENCOUNTER — Other Ambulatory Visit: Payer: Self-pay | Admitting: *Deleted

## 2016-06-14 VITALS — BP 134/79 | HR 84 | Temp 98.5°F | Resp 18 | Ht 65.0 in | Wt 150.7 lb

## 2016-06-14 DIAGNOSIS — C50312 Malignant neoplasm of lower-inner quadrant of left female breast: Secondary | ICD-10-CM | POA: Diagnosis not present

## 2016-06-14 DIAGNOSIS — Z5111 Encounter for antineoplastic chemotherapy: Secondary | ICD-10-CM

## 2016-06-14 DIAGNOSIS — Z170 Estrogen receptor positive status [ER+]: Secondary | ICD-10-CM | POA: Diagnosis not present

## 2016-06-14 DIAGNOSIS — R197 Diarrhea, unspecified: Secondary | ICD-10-CM | POA: Diagnosis not present

## 2016-06-14 DIAGNOSIS — N185 Chronic kidney disease, stage 5: Secondary | ICD-10-CM

## 2016-06-14 DIAGNOSIS — N051 Unspecified nephritic syndrome with focal and segmental glomerular lesions: Secondary | ICD-10-CM

## 2016-06-14 LAB — COMPREHENSIVE METABOLIC PANEL
ALT: 35 U/L (ref 0–55)
ANION GAP: 11 meq/L (ref 3–11)
AST: 17 U/L (ref 5–34)
Albumin: 3.5 g/dL (ref 3.5–5.0)
Alkaline Phosphatase: 109 U/L (ref 40–150)
BUN: 13 mg/dL (ref 7.0–26.0)
CHLORIDE: 106 meq/L (ref 98–109)
CO2: 25 mEq/L (ref 22–29)
CREATININE: 0.9 mg/dL (ref 0.6–1.1)
Calcium: 9.5 mg/dL (ref 8.4–10.4)
EGFR: 80 mL/min/{1.73_m2} — ABNORMAL LOW (ref >90)
Glucose: 106 mg/dl (ref 70–140)
POTASSIUM: 3.2 meq/L — AB (ref 3.5–5.1)
Sodium: 143 mEq/L (ref 136–145)
Total Bilirubin: 0.27 mg/dL (ref 0.20–1.20)
Total Protein: 6.7 g/dL (ref 6.4–8.3)

## 2016-06-14 LAB — CBC WITH DIFFERENTIAL/PLATELET
BASO%: 0.6 % (ref 0.0–2.0)
BASOS ABS: 0.1 10*3/uL (ref 0.0–0.1)
EOS%: 0.2 % (ref 0.0–7.0)
Eosinophils Absolute: 0 10*3/uL (ref 0.0–0.5)
HCT: 33.4 % — ABNORMAL LOW (ref 34.8–46.6)
HGB: 10.5 g/dL — ABNORMAL LOW (ref 11.6–15.9)
LYMPH%: 9 % — AB (ref 14.0–49.7)
MCH: 25.6 pg (ref 25.1–34.0)
MCHC: 31.4 g/dL — ABNORMAL LOW (ref 31.5–36.0)
MCV: 81.5 fL (ref 79.5–101.0)
MONO#: 0.7 10*3/uL (ref 0.1–0.9)
MONO%: 4.5 % (ref 0.0–14.0)
NEUT#: 13.8 10*3/uL — ABNORMAL HIGH (ref 1.5–6.5)
NEUT%: 85.7 % — AB (ref 38.4–76.8)
PLATELETS: 220 10*3/uL (ref 145–400)
RBC: 4.1 10*6/uL (ref 3.70–5.45)
RDW: 13.8 % (ref 11.2–14.5)
WBC: 16.1 10*3/uL — ABNORMAL HIGH (ref 3.9–10.3)
lymph#: 1.5 10*3/uL (ref 0.9–3.3)

## 2016-06-14 MED ORDER — SODIUM CHLORIDE 0.9 % IV SOLN
Freq: Once | INTRAVENOUS | Status: AC
  Administered 2016-06-14: 09:00:00 via INTRAVENOUS
  Filled 2016-06-14: qty 5

## 2016-06-14 MED ORDER — PALONOSETRON HCL INJECTION 0.25 MG/5ML
0.2500 mg | Freq: Once | INTRAVENOUS | Status: AC
  Administered 2016-06-14: 0.25 mg via INTRAVENOUS

## 2016-06-14 MED ORDER — PEGFILGRASTIM 6 MG/0.6ML ~~LOC~~ PSKT
6.0000 mg | PREFILLED_SYRINGE | Freq: Once | SUBCUTANEOUS | Status: AC
  Administered 2016-06-14: 6 mg via SUBCUTANEOUS
  Filled 2016-06-14: qty 0.6

## 2016-06-14 MED ORDER — SODIUM CHLORIDE 0.9% FLUSH
10.0000 mL | INTRAVENOUS | Status: DC | PRN
  Administered 2016-06-14: 10 mL
  Filled 2016-06-14: qty 10

## 2016-06-14 MED ORDER — SODIUM CHLORIDE 0.9 % IV SOLN
Freq: Once | INTRAVENOUS | Status: AC
  Administered 2016-06-14: 09:00:00 via INTRAVENOUS

## 2016-06-14 MED ORDER — PALONOSETRON HCL INJECTION 0.25 MG/5ML
INTRAVENOUS | Status: AC
  Filled 2016-06-14: qty 5

## 2016-06-14 MED ORDER — DOXORUBICIN HCL CHEMO IV INJECTION 2 MG/ML
60.0000 mg/m2 | Freq: Once | INTRAVENOUS | Status: AC
  Administered 2016-06-14: 110 mg via INTRAVENOUS
  Filled 2016-06-14: qty 55

## 2016-06-14 MED ORDER — SODIUM CHLORIDE 0.9 % IV SOLN
600.0000 mg/m2 | Freq: Once | INTRAVENOUS | Status: AC
  Administered 2016-06-14: 1100 mg via INTRAVENOUS
  Filled 2016-06-14: qty 55

## 2016-06-14 MED ORDER — HEPARIN SOD (PORK) LOCK FLUSH 100 UNIT/ML IV SOLN
500.0000 [IU] | Freq: Once | INTRAVENOUS | Status: AC | PRN
  Administered 2016-06-14: 500 [IU]
  Filled 2016-06-14: qty 5

## 2016-06-14 NOTE — Progress Notes (Signed)
[place]  [phone] [phone]     ID: ZOA DOWTY DOB: 1960-06-10  MR#: [id number]  OYD#:[id number]  Patient Care Team: [name], MD as PCP - General (Obstetrics and Gynecology) [name], MD as Consulting Physician (Nephrology) [name], MD as Consulting Physician (Obstetrics and Gynecology) [name], MD as Consulting Physician (General Surgery) [name], MD as Consulting Physician (Oncology) [name], MD as Consulting Physician (Radiation Oncology) [name], MD as Referring Physician (Surgery) [name], DO (Internal Medicine) Scot Dock, NP OTHER MD:  CHIEF COMPLAINT: Estrogen receptor positive breast cancer  CURRENT TREATMENT: Neoadjuvant chemotherapy  INTERVAL HISTORY: Erika Freeman returns today for follow-up of her estrogen receptor positive breast cancer accompanied by her husband Erika Freeman. The patient is receiving doxorubicin and cyclophosphamide in dose dense fashion and today is day 1 cycle 2 of 4 cycles planned, to be followed by weekly Abraxane.  She is doing well today.  She was previously having 5-10 episodes of diarrhea per day prior to last week, now she is having 2-3 episodes per day.  She is tolerating the [place] much better than the imodium and plans to do that instead of the imodium.     REVIEW OF SYSTEMS: Erika Freeman denies fevers, chills, nausea, vomiting, mucositis, peripheral neuropathy, chest pain, palpitations, or any further concerns.  A detailed ROS was conducted and otherwise non contributory.    BREAST CANCER HISTORY: From the original intake note:  "Erika Freeman" herself noted a mass in her left breast. She ignored it because she thought it was related to menstruation. However as it persisted through 1. She brought it to Dr. [name] attention and on 05/10/2016 she underwent left diagnostic mammography with tomography and [facility] The breast density was category C. In the left breast lower central area there was a new circumscribed mass. This was firm and fixed in the lower inner left breast at middle depth. Ultrasonography confirmed a 2.6 cm hypoechoic irregular mass at the 6:30 o'clock radiant 3 cm from the nipple. There was also a 0.5 cm nodule 1.4 cm medial to the mass just described.  Biopsy of the left breast mass in question 05/10/2016 showed([id number]) invasive ductal carcinoma, grade 3, estrogen receptor 70% positive with weak staining intensity, progesterone receptor negative, with no HER-2 amplification, the signals ratio being 1.52 and the number per cell 2.35. The proliferation marker was 90%.  Her subsequent history is as detailed below  OF NOTE: The patient has a history of focal segmental glomerular sclerosis with end-stage renal disease and is status post unrelated donor renal transplant 04/15/2013 as part of appeared donor exchange with her husband donating one kidney, the other harvested from a 55 year old [place] man. She continues on immunosuppression with mycophenolate and tacrolimus.  PAST MEDICAL HISTORY: Past Medical History:  Diagnosis Date  . Anemia   . Cancer [facility])    breast cancer  . Chills   . Chronic kidney disease   . Cough   . Fibroid tumor   . GERD (gastroesophageal reflux disease)   . Heart murmur   . Hypertension    DX  30 YRS AGO  . Umbilical hernia s/p primary repair 01/10/2012 01/24/2012  . Vomiting     PAST SURGICAL HISTORY: Past Surgical History:  Procedure Laterality Date  . BREAST SURGERY  2012   left - fibroadenoma  . CAPD INSERTION  01/10/2012   Procedure: LAPAROSCOPIC INSERTION CONTINUOUS AMBULATORY PERITONEAL DIALYSIS  (  CAPD) CATHETER;  Surgeon: Adin Hector, MD;  Location: [place];  Service: General;  Laterality: N/A;  LAPAROSCOPIC CAPD CATHETER PLACEMENT OMENTOPEXY  . CESAREAN SECTION  1996  . DILATION AND CURETTAGE OF UTERUS    . KIDNEY TRANSPLANT      . PARATHYROIDECTOMY  2000 - approximate  . PORTACATH PLACEMENT Right 05/24/2016   Procedure: INSERTION PORT-A-CATH WITH [place];  Surgeon: [name], MD;  Location: [place];  Service: General;  Laterality: Right;  . RENAL BIOPSY  2011  . UMBILICAL HERNIA REPAIR  01/10/2012   Procedure: HERNIA REPAIR UMBILICAL ADULT;  Surgeon: Adin Hector, MD;  Location: [place];  Service: General;  Laterality: N/A;    FAMILY HISTORY Family History  Problem Relation Age of Onset  . Cancer Maternal Uncle     lung  . Cancer Cousin     breast  . Cancer Cousin     breast  The patient has very little information regarding her father. Her mother is living, 29 years old as of March 2018. The patient had one full brother, diagnosed with prostate cancer in his 87s. The patient has 2 half-brothers and 1 half-sister. There is no history of breast or ovarian cancer in the family to the patient's knowledge   GYNECOLOGIC HISTORY:  No LMP recorded.  Menarche age 60, first live birth age 31, the patient is GX P3. Her periods are regular, the last 5 or 6 days, with no heavy days. 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 Her husband white works at home [employer]. Son [name] lives in [place] and as Freight forwarder of a Therapist, art. Son [name] lives in [place] and is a Air traffic controller. Son [name] lives in [place] and is studying based trombone. The patient has 2 grandchildren. She attends a local [employer]:  not in place    HEALTH MAINTENANCE: Social History  Substance Use Topics  . Smoking status: Never Smoker  . Smokeless tobacco: Never Used  . Alcohol use No     Colonoscopy:  PAP:  Bone density:   Allergies  Allergen Reactions  . No Known Allergies     Current Outpatient Prescriptions  Medication Sig Dispense Refill  . cinacalcet  (SENSIPAR) 30 MG tablet 1 by mouth on MWF    . predniSONE (DELTASONE) 5 MG tablet Take 5 mg by mouth.    . tacrolimus (PROGRAF) 1 MG capsule 5 mg in the am and 5 mg the pm    . amLODipine (NORVASC) 5 MG tablet Take 5 mg by mouth daily.     [name] aspirin 81 MG tablet Take 81 mg by mouth daily.    . cholestyramine (QUESTRAN) 4 g packet Take 1 packet (4 g total) by mouth 3 (three) times daily with meals. 60 each 12  . dexamethasone (DECADRON) 4 MG tablet Take 2 tablets by mouth once a day on the day after chemotherapy and then take 2 tablets two times a day for 2 days. Take with food. 30 tablet 1  . Iron, Ferrous Sulfate, 142 (45 Fe) MG TBCR Take 1 tablet by mouth daily.    [name] lidocaine-prilocaine (EMLA) cream Apply to affected area once 30 g 3  . loratadine (CLARITIN) 10 MG tablet Take 1 tablet (10 mg total) by mouth daily. 100 tablet 0  . LORazepam (ATIVAN) 0.5 MG tablet Take 1  tablet (0.5 mg total) by mouth at bedtime. 30 tablet 0  . lovastatin (MEVACOR) 20 MG tablet Take 20 mg by mouth every Monday, Wednesday, and Friday.     . magnesium (MAGTAB) 84 MG (7MEQ) TBCR SR tablet Take 168-252 mg by mouth See admin instructions. 2 tabs at [facility], and 3 tabs at 1700    . predniSONE 5 MG/5ML solution Take 5 mg by mouth daily with breakfast.    . prochlorperazine (COMPAZINE) 10 MG tablet TAKE 1 TABLET BY MOUTH EVERY 6 HOURS AS NEEDED FOR NAUSEA OR VOMITING 385 tablet 1  . SENSIPAR 60 MG tablet Take 60 mg by mouth daily.     . tacrolimus (PROGRAF) 1 MG capsule Take 3 mg by mouth 2 (two) times daily.     No current facility-administered medications for this visit.     OBJECTIVE:    Vitals:   06/14/16 0812  BP: 134/79  Pulse: 84  Resp: 18  Temp: 98.5 F (36.9 C)     Body mass index is 25.08 kg/m.    ECOG FS:1 - Symptomatic but completely ambulatory GENERAL: Patient is a well appearing female in no acute distress HEENT:  Sclerae anicteric. PERRL. Oropharynx clear and moist. No ulcerations or evidence of  oropharyngeal candidiasis. Neck is supple.  NODES:  No cervical, supraclavicular, or axillary lymphadenopathy palpated.  BREAST EXAM:  Deferred. LUNGS:  Clear to auscultation bilaterally.  No wheezes or rhonchi. HEART:  Regular rate and rhythm. No murmur appreciated. ABDOMEN:  Soft, nontender.  Positive, normoactive bowel sounds. No organomegaly palpated. MSK:  No focal spinal tenderness to palpation. Full range of motion bilaterally in the upper extremities. EXTREMITIES:  No peripheral edema.   SKIN:  Clear with no obvious rashes or skin changes. No nail dyscrasia. NEURO:  Nonfocal. Well oriented.  Appropriate affect.    LAB RESULTS:  CMP     Component Value Date/Time   NA 138 06/08/2016 1513   K 3.1 (L) 06/08/2016 1513   CL 108 05/24/2016 1104   CO2 20 (L) 06/08/2016 1513   GLUCOSE 112 06/08/2016 1513   BUN 12.4 06/08/2016 1513   CREATININE 0.8 06/08/2016 1513   CALCIUM 8.0 (L) 06/08/2016 1513   PROT 6.6 06/08/2016 1513   ALBUMIN 3.6 06/08/2016 1513   AST 8 06/08/2016 1513   ALT 31 06/08/2016 1513   ALKPHOS 79 06/08/2016 1513   BILITOT 0.61 06/08/2016 1513   GFRNONAA >60 05/24/2016 1104   GFRAA >60 05/24/2016 1104    No results found for: TOTALPROTELP, ALBUMINELP, A1GS, A2GS, BETS, BETA2SER, GAMS, MSPIKE, SPEI  No results found for: KPAFRELGTCHN, LAMBDASER, KAPLAMBRATIO  Lab Results  Component Value Date   WBC 16.1 (H) 06/14/2016   NEUTROABS 13.8 (H) 06/14/2016   HGB 10.5 (L) 06/14/2016   HCT 33.4 (L) 06/14/2016   MCV 81.5 06/14/2016   PLT 220 06/14/2016      Chemistry      Component Value Date/Time   NA 138 06/08/2016 1513   K 3.1 (L) 06/08/2016 1513   CL 108 05/24/2016 1104   CO2 20 (L) 06/08/2016 1513   BUN 12.4 06/08/2016 1513   CREATININE 0.8 06/08/2016 1513      Component Value Date/Time   CALCIUM 8.0 (L) 06/08/2016 1513   ALKPHOS 79 06/08/2016 1513   AST 8 06/08/2016 1513   ALT 31 06/08/2016 1513   BILITOT 0.61 06/08/2016 1513       No  results found for: LABCA2  No components found for: ERXVQM086  No results for input(s): INR in the last 168 hours.  Urinalysis    Component Value Date/Time   COLORURINE YELLOW 11/24/2006 1539   APPEARANCEUR CLEAR 11/24/2006 1539   LABSPEC 1.018 11/24/2006 1539   PHURINE 6.0 11/24/2006 1539   GLUCOSEU NEGATIVE 11/24/2006 1539   HGBUR SMALL (A) 11/24/2006 1539   BILIRUBINUR NEGATIVE 11/24/2006 1539   KETONESUR NEGATIVE 11/24/2006 1539   PROTEINUR 100 (A) 11/24/2006 1539   UROBILINOGEN 1.0 11/24/2006 1539   NITRITE NEGATIVE 11/24/2006 1539   LEUKOCYTESUR MODERATE (A) 11/24/2006 1539     STUDIES: Dg Chest Port 1 View  Result Date: 05/24/2016 CLINICAL DATA:  Status post Port-A-Cath placement today. EXAM: PORTABLE CHEST 1 VIEW COMPARISON:  PA and lateral chest 01/02/2012. FINDINGS: Right IJ approach Port-A-Cath tip projects in the mid to lower superior vena cava. No pneumothorax. Lungs are clear. Heart size is upper normal. IMPRESSION: Port-A-Cath tip projects in the mid to lower superior vena cava. Negative for pneumothorax or acute disease. Electronically Signed   By: Inge Rise M.D.   On: 05/24/2016 15:03   Dg Fluoro Guide Cv Line-no Report  Result Date: 05/24/2016 Fluoroscopy was utilized by the requesting physician.  No radiographic interpretation.   Mm Clip Placement Left  Result Date: 06/02/2016 CLINICAL DATA:  Status post ultrasound-guided core needle biopsy of a 6 mm mass in the 6:30 o'clock position of the left breast. EXAM: DIAGNOSTIC LEFT MAMMOGRAM POST STEREOTACTIC BIOPSY COMPARISON:  Previous exam(s). FINDINGS: Mammographic images were obtained following stereotactic guided biopsy of a 6 mm mass in the 6:30 o'clock position of the left breast. These demonstrate a coil shaped biopsy marker clip at the location of the biopsied mass. In the craniocaudal projection, this is located 1.8 cm anteromedial to the previously placed ribbon shaped biopsy marker clip within the  biopsy-proven invasive ductal carcinoma in the 6:30 o'clock position of the breast. In the true lateral projection, this is located 1.3 cm anterior and inferior to the previously placed ribbon shaped biopsy marker clip. IMPRESSION: Appropriate clip deployment following left breast ultrasound-guided core needle biopsy. Final Assessment: Post Procedure Mammograms for Marker Placement Electronically Signed   By: Claudie Revering M.D.   On: 06/02/2016 13:53   [place] Lt Breast Bx W Loc Dev 1st Lesion Img Bx Spec US Guide  Addendum Date: 06/13/2016   ADDENDUM REPORT: 06/06/2016 07:11 ADDENDUM: Pathology revealed grade III invasive ductal carcinoma and ductal carcinoma in situ in the left breast. This was found to be concordant by Dr. Claudie Revering. Pathology results were discussed with the patient by telephone. The patient reported doing well after the biopsy. Post biopsy instructions and care were reviewed and questions were answered. The patient was encouraged to call [facility] for any additional concerns. The patient has a recent diagnosis of left breast cancer and should follow her outlined treatment plan. Pathology results reported by Susa Raring RN, BSN on 06/06/2016. Electronically Signed   By: Claudie Revering M.D.   On: 06/06/2016 07:11   Result Date: 06/13/2016 CLINICAL DATA:  5 mm possible satellite nodule 1.4 cm medial to a recently biopsied invasive ductal carcinoma in the 6:30 o'clock position of the left breast. The patient started neoadjuvant chemotherapy yesterday. EXAM: LEFT BREAST ULTRASOUND GUIDED CORE NEEDLE BIOPSY COMPARISON:  Previous exam(s). FINDINGS: I met with the patient and we discussed the procedure of ultrasound-guided biopsy, including benefits and alternatives. We discussed the high likelihood of a successful procedure. We discussed the risks of the procedure,  including infection, bleeding, tissue injury, clip migration, and inadequate sampling. Informed written  consent was given. The usual time-out protocol was performed immediately prior to the procedure. Using sterile technique and 1% Lidocaine as local anesthetic, under direct ultrasound visualization, a 12 gauge spring-loaded device was used to perform biopsy of the recently demonstrated 5 mm mass in the 6:30 o'clock position of the left breast. Today, this mass measures 6 x 4 x 3 mm in maximum dimensions with a small bridging connection to the adjacent previously biopsied invasive ductal carcinoma. An inferior approach was used. At the conclusion of the procedure a coil shaped tissue marker clip was deployed into the biopsy cavity. Follow up 2 view mammogram was performed and dictated separately. IMPRESSION: Ultrasound guided biopsy of a 6 mm mass in the 6:30 o'clock position of the left breast, medial to the recently biopsied invasive duct carcinoma. No apparent complications. Electronically Signed: By: Claudie Revering M.D. On: 06/02/2016 13:45    ELIGIBLE FOR AVAILABLE RESEARCH PROTOCOL: no  ASSESSMENT: 56 y.o. [place], [place] woman status post left breast lower inner quadrant biopsy 05/10/2016 for a clinical  T2 N0, prognostic stage IIB invaside ductal carcinoma, grade 3, weekly estrogen receptor positive, progesterone receptor negative, with no HER-2 amplification, and the MIB-1 at 90%  (a) biopsy of the 0.6 mm satellite at the 6:30 o'clock radiant in the left breast 06/02/2016 showed invasive ductal carcinoma, grade 3, estrogen receptor 20% positive, with weak staining intensity, progesterone receptor negative with no HER-2 amplification, and an MIB-1 of 70% (identical to larger mass)   (1) neoadjuvant chemotherapy consisting of doxorubicin and cyclophosphamide in dose dense fashion 4 to started 06/01/2016, to be followed by Abraxane weekly 12  (2) definitive surgery to follow, with breast conservation preferred if feasible   (a) satellite lesion noted on [place] to be included in final surgery sample  (3)  adjuvant radiation as appropriate  (4) anti-estrogens to follow the completion of local treatment  PLAN: Erika Freeman is doing well today and her cbc is stable.  I reviewed this with her and her husband in detail.  She will proceed with cycle two of AC today.  She will start the [place] for diarrhea likely tomorrow.  She and I discussed fluid intake and contacting us for any uncontrolled diarrhea or signs of dehydration.  She will return in one week for labs and an appointment with Dr. Jana Hakim.    All questions were answered.  Erika Freeman berbalized understanding of the above plan and is in agreement with it.  She knows to call for any questions or concerns prior to her next appt.    A total of (30) minutes of face-to-face time was spent with this patient with greater than 50% of that time in counseling and care-coordination.  Scot Dock, NP   06/14/2016 8:24 AM Medical Oncology and Hematology [facility] [address] [place], [place] [address] Tel. [phone]    Fax. [phone]

## 2016-06-14 NOTE — Patient Instructions (Signed)
[facility] Discharge Instructions for Patients Receiving Chemotherapy  Today you received the following chemotherapy agents Adriamycin and Cytoxan   To help prevent nausea and vomiting after your treatment, we encourage you to take your nausea medication as directed. No Zofran for 3 days. Take Compazine instead.    If you develop nausea and vomiting that is not controlled by your nausea medication, call the clinic.   BELOW ARE SYMPTOMS THAT SHOULD BE REPORTED IMMEDIATELY:  *FEVER GREATER THAN 100.5 F  *CHILLS WITH OR WITHOUT FEVER  NAUSEA AND VOMITING THAT IS NOT CONTROLLED WITH YOUR NAUSEA MEDICATION  *UNUSUAL SHORTNESS OF BREATH  *UNUSUAL BRUISING OR BLEEDING  TENDERNESS IN MOUTH AND THROAT WITH OR WITHOUT PRESENCE OF ULCERS  *URINARY PROBLEMS  *BOWEL PROBLEMS  UNUSUAL RASH Items with * indicate a potential emergency and should be followed up as soon as possible.  Feel free to call the clinic you have any questions or concerns. The clinic phone number is [phone].  Please show the CHEMO ALERT CARD at check-in to the Emergency Department and triage nurse.   

## 2016-06-15 ENCOUNTER — Ambulatory Visit: Payer: Medicare Other | Admitting: Oncology

## 2016-06-17 ENCOUNTER — Ambulatory Visit: Payer: Medicare Other | Admitting: Oncology

## 2016-06-22 ENCOUNTER — Ambulatory Visit (HOSPITAL_BASED_OUTPATIENT_CLINIC_OR_DEPARTMENT_OTHER): Payer: Medicare Other | Admitting: Oncology

## 2016-06-22 ENCOUNTER — Other Ambulatory Visit (HOSPITAL_BASED_OUTPATIENT_CLINIC_OR_DEPARTMENT_OTHER): Payer: Medicare Other

## 2016-06-22 VITALS — BP 120/71 | HR 91 | Temp 98.4°F | Resp 18 | Ht 65.0 in | Wt 148.3 lb

## 2016-06-22 DIAGNOSIS — G4700 Insomnia, unspecified: Secondary | ICD-10-CM | POA: Diagnosis not present

## 2016-06-22 DIAGNOSIS — C50312 Malignant neoplasm of lower-inner quadrant of left female breast: Secondary | ICD-10-CM

## 2016-06-22 DIAGNOSIS — R197 Diarrhea, unspecified: Secondary | ICD-10-CM

## 2016-06-22 DIAGNOSIS — Z170 Estrogen receptor positive status [ER+]: Principal | ICD-10-CM

## 2016-06-22 LAB — COMPREHENSIVE METABOLIC PANEL
ALBUMIN: 3.6 g/dL (ref 3.5–5.0)
ALK PHOS: 106 U/L (ref 40–150)
ALT: 19 U/L (ref 0–55)
AST: 9 U/L (ref 5–34)
Anion Gap: 13 mEq/L — ABNORMAL HIGH (ref 3–11)
BILIRUBIN TOTAL: 0.29 mg/dL (ref 0.20–1.20)
BUN: 8.1 mg/dL (ref 7.0–26.0)
CO2: 20 mEq/L — ABNORMAL LOW (ref 22–29)
CREATININE: 0.8 mg/dL (ref 0.6–1.1)
Calcium: 9.5 mg/dL (ref 8.4–10.4)
Chloride: 107 mEq/L (ref 98–109)
EGFR: >90 mL/min/{1.73_m2} (ref >90)
Glucose: 119 mg/dl (ref 70–140)
Potassium: 3.5 mEq/L (ref 3.5–5.1)
Sodium: 139 mEq/L (ref 136–145)
Total Protein: 6.7 g/dL (ref 6.4–8.3)

## 2016-06-22 LAB — CBC WITH DIFFERENTIAL/PLATELET
BASO%: 0.3 % (ref 0.0–2.0)
Basophils Absolute: 0 10*3/uL (ref 0.0–0.1)
EOS%: 0 % (ref 0.0–7.0)
Eosinophils Absolute: 0 10*3/uL (ref 0.0–0.5)
HEMATOCRIT: 32.6 % — AB (ref 34.8–46.6)
HEMOGLOBIN: 10.3 g/dL — AB (ref 11.6–15.9)
LYMPH%: 8.4 % — ABNORMAL LOW (ref 14.0–49.7)
MCH: 25.3 pg (ref 25.1–34.0)
MCHC: 31.6 g/dL (ref 31.5–36.0)
MCV: 80.2 fL (ref 79.5–101.0)
MONO#: 0.6 10*3/uL (ref 0.1–0.9)
MONO%: 13.3 % (ref 0.0–14.0)
NEUT#: 3.7 10*3/uL (ref 1.5–6.5)
NEUT%: 78 % — ABNORMAL HIGH (ref 38.4–76.8)
Platelets: 194 10*3/uL (ref 145–400)
RBC: 4.07 10*6/uL (ref 3.70–5.45)
RDW: 14.2 % (ref 11.2–14.5)
WBC: 4.8 10*3/uL (ref 3.9–10.3)
lymph#: 0.4 10*3/uL — ABNORMAL LOW (ref 0.9–3.3)

## 2016-06-22 NOTE — Progress Notes (Signed)
[name]  [phone] [phone]     ID: Erika Freeman DOB: [birth year]  MR#: [id number]  JYN#:[id number]  Patient Care Team: [name], MD as PCP - General (Obstetrics and Gynecology) [name], MD as Consulting Physician (Nephrology) [name], MD as Consulting Physician (Obstetrics and Gynecology) [name], MD as Consulting Physician (General Surgery) [name], MD as Consulting Physician (Oncology) [name], MD as Consulting Physician (Radiation Oncology) [name], MD as Referring Physician (Surgery) [name], DO (Internal Medicine) [name], MD OTHER MD:  CHIEF COMPLAINT: Estrogen receptor positive breast cancer  CURRENT TREATMENT: Neoadjuvant chemotherapy  INTERVAL HISTORY: Erika Freeman returns today for follow-up of her estrogen receptor positive breast cancer, accompanied by her husband [name]. Today is day 9 cycle 2 of 4 planned cycles of cyclophosphamide and doxorubicin which she receives every 2 weeks, to be followed by weekly Abraxane 12.  Since her last visit here she met with Dr. Billey [place]. She was taken off amlodipine because her blood pressure was fairly low and she was symptomatic from that. Her Sensipar also was dropped to 3 times a week. Her magnesium was stopped. With all those changes she felt a little bit more energy and has had no problems with dizziness or falls.  REVIEW OF SYSTEMS: Erika Freeman has mild angular cheilitis. She has no mouth sores, had no nausea or vomiting, and still has fairly good taste. He finally did lose her hair. Her diarrhea is much decreased since she started using Questran. Now however she feels she has more gas. She says she is sleep deprived. She wakes up all the time. Sometimes she takes naps during the day and of course it also interrupts her nighttime sleep. Aside from these issues a detailed review of systems today was  stable  BREAST CANCER HISTORY: From the original intake note:  "Erika Freeman" herself noted a mass in her left breast. She ignored it because she thought it was related to menstruation. However as it persisted through 1. She brought it to Dr. [name] attention and on 05/10/2016 she underwent left diagnostic mammography with tomography [facility] The breast density was category C. In the left breast lower central area there was a new circumscribed mass. This was firm and fixed in the lower inner left breast at middle depth. Ultrasonography confirmed a 2.6 cm hypoechoic irregular mass at the 6:30 o'clock radiant 3 cm from the nipple. There was also a 0.5 cm nodule 1.4 cm medial to the mass just described.  Biopsy of the left breast mass in question 05/10/2016 showed([id number]) invasive ductal carcinoma, grade 3, estrogen receptor 70% positive with weak staining intensity, progesterone receptor negative, with no HER-2 amplification, the signals ratio being 1.52 and the number per cell 2.35. The proliferation marker was 90%.  Her subsequent history is as detailed below  OF NOTE: The patient has a history of focal segmental glomerular sclerosis with end-stage renal disease and is status post unrelated donor renal transplant 04/15/2013 as part of appeared donor exchange with her husband donating one kidney, the other harvested from a 55 year old [place] man. She continues on immunosuppression with mycophenolate and tacrolimus.  PAST MEDICAL HISTORY: Past Medical History:  Diagnosis Date  . Anemia   . Cancer [facility])    breast cancer  . Chills   . Chronic kidney disease   . Cough   . Fibroid tumor   . GERD (gastroesophageal reflux disease)   .  Heart murmur   . Hypertension    DX  30 YRS AGO  . Umbilical hernia s/p primary repair 01/10/2012 01/24/2012  . Vomiting     PAST SURGICAL HISTORY: Past Surgical History:  Procedure Laterality Date  . BREAST SURGERY   2012   left - fibroadenoma  . CAPD INSERTION  01/10/2012   Procedure: LAPAROSCOPIC INSERTION CONTINUOUS AMBULATORY PERITONEAL DIALYSIS  (CAPD) CATHETER;  Surgeon: Adin Hector, MD;  Location: [place];  Service: General;  Laterality: N/A;  LAPAROSCOPIC CAPD CATHETER PLACEMENT OMENTOPEXY  . CESAREAN SECTION  1996  . DILATION AND CURETTAGE OF UTERUS    . KIDNEY TRANSPLANT    . PARATHYROIDECTOMY  2000 - approximate  . PORTACATH PLACEMENT Right 05/24/2016   Procedure: INSERTION PORT-A-CATH WITH [place];  Surgeon: [name], MD;  Location: [place];  Service: General;  Laterality: Right;  . RENAL BIOPSY  2011  . UMBILICAL HERNIA REPAIR  01/10/2012   Procedure: HERNIA REPAIR UMBILICAL ADULT;  Surgeon: Adin Hector, MD;  Location: [place];  Service: General;  Laterality: N/A;    FAMILY HISTORY Family History  Problem Relation Age of Onset  . Cancer Maternal Uncle     lung  . Cancer Cousin     breast  . Cancer Cousin     breast  The patient has very little information regarding her father. Her mother is living, 75 years old as of March 2018. The patient had one full brother, diagnosed with prostate cancer in his 76s. The patient has 2 half-brothers and 1 half-sister. There is no history of breast or ovarian cancer in the family to the patient's knowledge   GYNECOLOGIC HISTORY:  No LMP recorded.  Menarche age 14, first live birth age 47, the patient is GX P3. Her periods are regular, the last 5 or 6 days, with no heavy days. 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 Her husband white works at home [employer]. Son Erika Freeman lives in [place] and as Freight forwarder of a Therapist, art. Son Erika Freeman lives in [place] and is a Air traffic controller. Son Erika Freeman lives in [place] and is studying based trombone. The patient has 2 grandchildren. She attends a local [employer]:  not in place    HEALTH MAINTENANCE: Social History  Substance Use Topics  . Smoking status: Never Smoker  . Smokeless tobacco: Never Used  . Alcohol use No     Colonoscopy:  PAP:  Bone density:   Allergies  Allergen Reactions  . No Known Allergies     Current Outpatient Prescriptions  Medication Sig Dispense Refill  . aspirin 81 MG tablet Take 81 mg by mouth daily.    . cholestyramine (QUESTRAN) 4 g packet Take 1 packet (4 g total) by mouth 3 (three) times daily with meals. 60 each 12  . cinacalcet (SENSIPAR) 30 MG tablet 1 by mouth on MWF    . dexamethasone (DECADRON) 4 MG tablet Take 2 tablets by mouth once a day on the day after chemotherapy and then take 2 tablets two times a day for 2 days. Take with food. 30 tablet 1  . Iron, Ferrous Sulfate, 142 (45 Fe) MG TBCR Take 1 tablet by mouth daily.    [name] lidocaine-prilocaine (EMLA) cream Apply to affected area once 30 g 3  . loratadine (CLARITIN) 10 MG tablet Take 1 tablet (10  mg total) by mouth daily. 100 tablet 0  . LORazepam (ATIVAN) 0.5 MG tablet Take 1 tablet (0.5 mg total) by mouth at bedtime. 30 tablet 0  . lovastatin (MEVACOR) 20 MG tablet Take 20 mg by mouth every Monday, Wednesday, and Friday.     . magnesium (MAGTAB) 84 MG (7MEQ) TBCR SR tablet Take 168-252 mg by mouth See admin instructions. 2 tabs at [facility], and 3 tabs at 1700    . predniSONE (DELTASONE) 5 MG tablet Take 5 mg by mouth.    . predniSONE 5 MG/5ML solution Take 5 mg by mouth daily with breakfast.    . prochlorperazine (COMPAZINE) 10 MG tablet TAKE 1 TABLET BY MOUTH EVERY 6 HOURS AS NEEDED FOR NAUSEA OR VOMITING 385 tablet 1  . SENSIPAR 60 MG tablet Take 60 mg by mouth daily.     . tacrolimus (PROGRAF) 1 MG capsule Take 3 mg by mouth 2 (two) times daily.    . tacrolimus (PROGRAF) 1 MG capsule 5 mg in the am and 5 mg the pm     No current facility-administered medications for this visit.     OBJECTIVE: middle-aged  African-American woman who appears stated age   73:   06/22/16 1526  BP: 120/71  Pulse: 91  Resp: 18  Temp: 98.4 F (36.9 C)     Body mass index is 24.68 kg/m.    ECOG FS:1 - Symptomatic but completely ambulatory  Sclerae unicteric, EOMs intact Oropharynx clear and moist No cervical or supraclavicular adenopathy Lungs no rales or rhonchi Heart regular rate and rhythm Abd soft, nontender, positive bowel sounds MSK no focal spinal tenderness, no upper extremity lymphedema Neuro: nonfocal, well oriented, appropriate affect Breasts: Deferred  LAB RESULTS:  CMP     Component Value Date/Time   NA 139 06/22/2016 1502   K 3.5 06/22/2016 1502   CL 108 05/24/2016 1104   CO2 20 (L) 06/22/2016 1502   GLUCOSE 119 06/22/2016 1502   BUN 8.1 06/22/2016 1502   CREATININE 0.8 06/22/2016 1502   CALCIUM 9.5 06/22/2016 1502   PROT 6.7 06/22/2016 1502   ALBUMIN 3.6 06/22/2016 1502   AST 9 06/22/2016 1502   ALT 19 06/22/2016 1502   ALKPHOS 106 06/22/2016 1502   BILITOT 0.29 06/22/2016 1502   GFRNONAA >60 05/24/2016 1104   GFRAA >60 05/24/2016 1104    No results found for: TOTALPROTELP, ALBUMINELP, A1GS, A2GS, BETS, BETA2SER, GAMS, MSPIKE, SPEI  No results found for: Nils Pyle, [facility]  Lab Results  Component Value Date   WBC 4.8 06/22/2016   NEUTROABS 3.7 06/22/2016   HGB 10.3 (L) 06/22/2016   HCT 32.6 (L) 06/22/2016   MCV 80.2 06/22/2016   PLT 194 06/22/2016      Chemistry      Component Value Date/Time   NA 139 06/22/2016 1502   K 3.5 06/22/2016 1502   CL 108 05/24/2016 1104   CO2 20 (L) 06/22/2016 1502   BUN 8.1 06/22/2016 1502   CREATININE 0.8 06/22/2016 1502      Component Value Date/Time   CALCIUM 9.5 06/22/2016 1502   ALKPHOS 106 06/22/2016 1502   AST 9 06/22/2016 1502   ALT 19 06/22/2016 1502   BILITOT 0.29 06/22/2016 1502       No results found for: LABCA2  No components found for: IONGEX528  No results for input(s): INR in  the last 168 hours.  Urinalysis    Component Value Date/Time   COLORURINE YELLOW 11/24/2006 1539  APPEARANCEUR CLEAR 11/24/2006 1539   LABSPEC 1.018 11/24/2006 1539   PHURINE 6.0 11/24/2006 1539   GLUCOSEU NEGATIVE 11/24/2006 1539   HGBUR SMALL (A) 11/24/2006 1539   BILIRUBINUR NEGATIVE 11/24/2006 1539   KETONESUR NEGATIVE 11/24/2006 1539   PROTEINUR 100 (A) 11/24/2006 1539   UROBILINOGEN 1.0 11/24/2006 1539   NITRITE NEGATIVE 11/24/2006 1539   LEUKOCYTESUR MODERATE (A) 11/24/2006 1539     STUDIES: Dg Chest Port 1 View  Result Date: 05/24/2016 CLINICAL DATA:  Status post Port-A-Cath placement today. EXAM: PORTABLE CHEST 1 VIEW COMPARISON:  PA and lateral chest 01/02/2012. FINDINGS: Right IJ approach Port-A-Cath tip projects in the mid to lower superior vena cava. No pneumothorax. Lungs are clear. Heart size is upper normal. IMPRESSION: Port-A-Cath tip projects in the mid to lower superior vena cava. Negative for pneumothorax or acute disease. Electronically Signed   By: Inge Rise M.D.   On: 05/24/2016 15:03   Dg Fluoro Guide Cv Line-no Report  Result Date: 05/24/2016 Fluoroscopy was utilized by the requesting physician.  No radiographic interpretation.   Mm Clip Placement Left  Result Date: 06/02/2016 CLINICAL DATA:  Status post ultrasound-guided core needle biopsy of a 6 mm mass in the 6:30 o'clock position of the left breast. EXAM: DIAGNOSTIC LEFT MAMMOGRAM POST STEREOTACTIC BIOPSY COMPARISON:  Previous exam(s). FINDINGS: Mammographic images were obtained following stereotactic guided biopsy of a 6 mm mass in the 6:30 o'clock position of the left breast. These demonstrate a coil shaped biopsy marker clip at the location of the biopsied mass. In the craniocaudal projection, this is located 1.8 cm anteromedial to the previously placed ribbon shaped biopsy marker clip within the biopsy-proven invasive ductal carcinoma in the 6:30 o'clock position of the breast. In the true  lateral projection, this is located 1.3 cm anterior and inferior to the previously placed ribbon shaped biopsy marker clip. IMPRESSION: Appropriate clip deployment following left breast ultrasound-guided core needle biopsy. Final Assessment: Post Procedure Mammograms for Marker Placement Electronically Signed   By: Claudie Revering M.D.   On: 06/02/2016 13:53   [place] Lt Breast Bx W Loc Dev 1st Lesion Img Bx Spec US Guide  Addendum Date: 06/13/2016   ADDENDUM REPORT: 06/06/2016 07:11 ADDENDUM: Pathology revealed grade III invasive ductal carcinoma and ductal carcinoma in situ in the left breast. This was found to be concordant by Dr. Claudie Revering. Pathology results were discussed with the patient by telephone. The patient reported doing well after the biopsy. Post biopsy instructions and care were reviewed and questions were answered. The patient was encouraged to call [facility] for any additional concerns. The patient has a recent diagnosis of left breast cancer and should follow her outlined treatment plan. Pathology results reported by Susa Raring RN, BSN on 06/06/2016. Electronically Signed   By: Claudie Revering M.D.   On: 06/06/2016 07:11   Result Date: 06/13/2016 CLINICAL DATA:  5 mm possible satellite nodule 1.4 cm medial to a recently biopsied invasive ductal carcinoma in the 6:30 o'clock position of the left breast. The patient started neoadjuvant chemotherapy yesterday. EXAM: LEFT BREAST ULTRASOUND GUIDED CORE NEEDLE BIOPSY COMPARISON:  Previous exam(s). FINDINGS: I met with the patient and we discussed the procedure of ultrasound-guided biopsy, including benefits and alternatives. We discussed the high likelihood of a successful procedure. We discussed the risks of the procedure, including infection, bleeding, tissue injury, clip migration, and inadequate sampling. Informed written consent was given. The usual time-out protocol was performed immediately prior to the procedure.  Using  sterile technique and 1% Lidocaine as local anesthetic, under direct ultrasound visualization, a 12 gauge spring-loaded device was used to perform biopsy of the recently demonstrated 5 mm mass in the 6:30 o'clock position of the left breast. Today, this mass measures 6 x 4 x 3 mm in maximum dimensions with a small bridging connection to the adjacent previously biopsied invasive ductal carcinoma. An inferior approach was used. At the conclusion of the procedure a coil shaped tissue marker clip was deployed into the biopsy cavity. Follow up 2 view mammogram was performed and dictated separately. IMPRESSION: Ultrasound guided biopsy of a 6 mm mass in the 6:30 o'clock position of the left breast, medial to the recently biopsied invasive duct carcinoma. No apparent complications. Electronically Signed: By: Claudie Revering M.D. On: 06/02/2016 13:45    ELIGIBLE FOR AVAILABLE RESEARCH PROTOCOL: no  ASSESSMENT: 55 y.o. [place], [place] woman status post left breast lower inner quadrant biopsy 05/10/2016 for a clinical  T2 N0, prognostic stage IIB invaside ductal carcinoma, grade 3, weekly estrogen receptor positive, progesterone receptor negative, with no HER-2 amplification, and the MIB-1 at 90%  (a) biopsy of the 0.6 mm satellite at the 6:30 o'clock radiant in the left breast 06/02/2016 showed invasive ductal carcinoma, grade 3, estrogen receptor 20% positive, with weak staining intensity, progesterone receptor negative with no HER-2 amplification, and an MIB-1 of 70% (identical to larger mass)   (1) neoadjuvant chemotherapy consisting of doxorubicin and cyclophosphamide in dose dense fashion 4 to started 06/01/2016, to be followed by Abraxane weekly 12  (2) definitive surgery to follow, with breast conservation preferred if feasible   (a) satellite lesion noted on [place] to be included in final surgery sample  (3) adjuvant radiation as appropriate  (4) anti-estrogens to follow the completion of local  treatment  PLAN: Erika Freeman continues to do remarkably well with her chemotherapy and in particular I am surprised at how good her counts are doing. Her creatinine also remains excellent.  The biggest problems she is having the diarrhea and the insomnia. The diarrhea is now much better controlled with Questran. She does have gas and she is going to try simethicone for that.  For the insomnia she has tried melatonin which did not work. She is reluctant to use the lorazepam but that is the only thing that is going to work on the first 3 nights of each cycle because she will be receiving dexamethasone those 3 days and Ativan is really necessary if she wishes to have any sleep most nights.  On the other nights if she wishes she can take Benadryl. Unlike lorazepam Benadryl does not cause tolerance. She can take the Benadryl and lorazepam together if she wishes the first 3 nights as well.   I think with thesesmall changes she will feel a little bit less tired, but patient's to feel tired with chemotherapy and she is tolerating it in the upper part of the curve.  Today we reviewed her overall treatment plan which includes 2 more cycles of the current chemotherapy and then 12 weekly doses of low-dose taxane she really has her next three-week appointments and when she sees me 3 weeks from now with her fourth and final dose of cyclophosphamide and doxorubicin I will make her the remaining chemotherapy appointments.  She knows to call for any other issues that may develop before her next visit.  [name], MD   06/22/2016 4:02 PM Medical Oncology and Hematology [facility]  [place], [place] [address] Tel. [phone]    Fax. [phone]

## 2016-06-28 ENCOUNTER — Other Ambulatory Visit: Payer: Self-pay | Admitting: *Deleted

## 2016-06-28 ENCOUNTER — Ambulatory Visit (HOSPITAL_BASED_OUTPATIENT_CLINIC_OR_DEPARTMENT_OTHER): Payer: Medicare Other | Admitting: Oncology

## 2016-06-28 ENCOUNTER — Encounter: Payer: Self-pay | Admitting: *Deleted

## 2016-06-28 ENCOUNTER — Other Ambulatory Visit: Payer: Self-pay | Admitting: Oncology

## 2016-06-28 ENCOUNTER — Ambulatory Visit (HOSPITAL_BASED_OUTPATIENT_CLINIC_OR_DEPARTMENT_OTHER): Payer: Medicare Other

## 2016-06-28 ENCOUNTER — Other Ambulatory Visit (HOSPITAL_BASED_OUTPATIENT_CLINIC_OR_DEPARTMENT_OTHER): Payer: Medicare Other

## 2016-06-28 VITALS — BP 127/80 | HR 82 | Temp 98.2°F | Resp 18 | Wt 147.8 lb

## 2016-06-28 DIAGNOSIS — C50312 Malignant neoplasm of lower-inner quadrant of left female breast: Secondary | ICD-10-CM

## 2016-06-28 DIAGNOSIS — Z5111 Encounter for antineoplastic chemotherapy: Secondary | ICD-10-CM

## 2016-06-28 DIAGNOSIS — Z170 Estrogen receptor positive status [ER+]: Secondary | ICD-10-CM | POA: Diagnosis not present

## 2016-06-28 DIAGNOSIS — N185 Chronic kidney disease, stage 5: Secondary | ICD-10-CM

## 2016-06-28 DIAGNOSIS — N051 Unspecified nephritic syndrome with focal and segmental glomerular lesions: Secondary | ICD-10-CM

## 2016-06-28 LAB — CBC WITH DIFFERENTIAL/PLATELET
BASO%: 0.5 % (ref 0.0–2.0)
Basophils Absolute: 0.1 10*3/uL (ref 0.0–0.1)
EOS ABS: 0 10*3/uL (ref 0.0–0.5)
EOS%: 0.1 % (ref 0.0–7.0)
HEMATOCRIT: 33.6 % — AB (ref 34.8–46.6)
HGB: 10.5 g/dL — ABNORMAL LOW (ref 11.6–15.9)
LYMPH%: 6.8 % — ABNORMAL LOW (ref 14.0–49.7)
MCH: 25.9 pg (ref 25.1–34.0)
MCHC: 31.3 g/dL — ABNORMAL LOW (ref 31.5–36.0)
MCV: 83 fL (ref 79.5–101.0)
MONO#: 0.9 10*3/uL (ref 0.1–0.9)
MONO%: 5.8 % (ref 0.0–14.0)
NEUT%: 86.8 % — ABNORMAL HIGH (ref 38.4–76.8)
NEUTROS ABS: 12.8 10*3/uL — AB (ref 1.5–6.5)
NRBC: 1 % — AB (ref 0–0)
PLATELETS: 215 10*3/uL (ref 145–400)
RBC: 4.05 10*6/uL (ref 3.70–5.45)
RDW: 15.3 % — AB (ref 11.2–14.5)
WBC: 14.7 10*3/uL — AB (ref 3.9–10.3)
lymph#: 1 10*3/uL (ref 0.9–3.3)

## 2016-06-28 LAB — COMPREHENSIVE METABOLIC PANEL
ALBUMIN: 3.8 g/dL (ref 3.5–5.0)
ALT: 19 U/L (ref 0–55)
ANION GAP: 12 meq/L — AB (ref 3–11)
AST: 14 U/L (ref 5–34)
Alkaline Phosphatase: 116 U/L (ref 40–150)
BILIRUBIN TOTAL: 0.28 mg/dL (ref 0.20–1.20)
BUN: 13.8 mg/dL (ref 7.0–26.0)
CALCIUM: 9.3 mg/dL (ref 8.4–10.4)
CO2: 25 mEq/L (ref 22–29)
CREATININE: 0.9 mg/dL (ref 0.6–1.1)
Chloride: 106 mEq/L (ref 98–109)
EGFR: 88 mL/min/{1.73_m2} — AB (ref >90)
Glucose: 99 mg/dl (ref 70–140)
Potassium: 3.1 mEq/L — ABNORMAL LOW (ref 3.5–5.1)
Sodium: 142 mEq/L (ref 136–145)
TOTAL PROTEIN: 6.5 g/dL (ref 6.4–8.3)

## 2016-06-28 MED ORDER — HEPARIN SOD (PORK) LOCK FLUSH 100 UNIT/ML IV SOLN
500.0000 [IU] | Freq: Once | INTRAVENOUS | Status: AC | PRN
  Administered 2016-06-28: 500 [IU]
  Filled 2016-06-28: qty 5

## 2016-06-28 MED ORDER — SODIUM CHLORIDE 0.9 % IV SOLN
Freq: Once | INTRAVENOUS | Status: AC
  Administered 2016-06-28: 09:00:00 via INTRAVENOUS

## 2016-06-28 MED ORDER — SODIUM CHLORIDE 0.9 % IV SOLN
600.0000 mg/m2 | Freq: Once | INTRAVENOUS | Status: AC
  Administered 2016-06-28: 1100 mg via INTRAVENOUS
  Filled 2016-06-28: qty 55

## 2016-06-28 MED ORDER — DOXORUBICIN HCL CHEMO IV INJECTION 2 MG/ML
60.0000 mg/m2 | Freq: Once | INTRAVENOUS | Status: AC
  Administered 2016-06-28: 110 mg via INTRAVENOUS
  Filled 2016-06-28: qty 55

## 2016-06-28 MED ORDER — SODIUM CHLORIDE 0.9% FLUSH
10.0000 mL | INTRAVENOUS | Status: DC | PRN
  Administered 2016-06-28: 10 mL
  Filled 2016-06-28: qty 10

## 2016-06-28 MED ORDER — PALONOSETRON HCL INJECTION 0.25 MG/5ML
INTRAVENOUS | Status: AC
  Filled 2016-06-28: qty 5

## 2016-06-28 MED ORDER — PALONOSETRON HCL INJECTION 0.25 MG/5ML
0.2500 mg | Freq: Once | INTRAVENOUS | Status: AC
  Administered 2016-06-28: 0.25 mg via INTRAVENOUS

## 2016-06-28 MED ORDER — POTASSIUM CHLORIDE ER 10 MEQ PO CPCR: 10.0000 meq | ORAL_CAPSULE | Freq: Every day | ORAL | 3 refills | Status: DC

## 2016-06-28 MED ORDER — PEGFILGRASTIM 6 MG/0.6ML ~~LOC~~ PSKT
6.0000 mg | PREFILLED_SYRINGE | Freq: Once | SUBCUTANEOUS | Status: AC
  Administered 2016-06-28: 6 mg via SUBCUTANEOUS
  Filled 2016-06-28: qty 0.6

## 2016-06-28 MED ORDER — FOSAPREPITANT DIMEGLUMINE INJECTION 150 MG
Freq: Once | INTRAVENOUS | Status: AC
  Administered 2016-06-28: 09:00:00 via INTRAVENOUS
  Filled 2016-06-28: qty 5

## 2016-06-28 NOTE — Progress Notes (Signed)
Val, RN of Dr. Jana Hakim notified of Potassium of 3.1.

## 2016-06-28 NOTE — Patient Instructions (Signed)
[facility] Discharge Instructions for Patients Receiving Chemotherapy  Today you received the following chemotherapy agents Adriamycin and Cytoxan   To help prevent nausea and vomiting after your treatment, we encourage you to take your nausea medication as directed. No Zofran for 3 days. Take Compazine instead.    If you develop nausea and vomiting that is not controlled by your nausea medication, call the clinic.   BELOW ARE SYMPTOMS THAT SHOULD BE REPORTED IMMEDIATELY:  *FEVER GREATER THAN 100.5 F  *CHILLS WITH OR WITHOUT FEVER  NAUSEA AND VOMITING THAT IS NOT CONTROLLED WITH YOUR NAUSEA MEDICATION  *UNUSUAL SHORTNESS OF BREATH  *UNUSUAL BRUISING OR BLEEDING  TENDERNESS IN MOUTH AND THROAT WITH OR WITHOUT PRESENCE OF ULCERS  *URINARY PROBLEMS  *BOWEL PROBLEMS  UNUSUAL RASH Items with * indicate a potential emergency and should be followed up as soon as possible.  Feel free to call the clinic you have any questions or concerns. The clinic phone number is [phone].  Please show the CHEMO ALERT CARD at check-in to the Emergency Department and triage nurse.   

## 2016-06-28 NOTE — Telephone Encounter (Signed)
This RN received call from treatment room RN reporting noted potassium of 3.1 per CMET obtained today and reviewed with MD. Prescription given and sent to pt's pharmacy. Pt made aware.

## 2016-06-28 NOTE — Progress Notes (Signed)
[place]  [phone] [phone]     ID: Erika Freeman DOB: 1960/12/23  MR#: [id number]  JYN#:[id number]  Patient Care Team: [name], MD as PCP - General (Obstetrics and Gynecology) [name], MD as Consulting Physician (Nephrology) [name], MD as Consulting Physician (Obstetrics and Gynecology) [name], MD as Consulting Physician (General Surgery) [name], MD as Consulting Physician (Oncology) [name], MD as Consulting Physician (Radiation Oncology) [name], MD as Referring Physician (Surgery) [name], DO (Internal Medicine) [name], MD OTHER MD:  CHIEF COMPLAINT: Estrogen receptor positive breast cancer  CURRENT TREATMENT: Neoadjuvant chemotherapy  INTERVAL HISTORY: Erika Freeman returns today for follow-up of her estrogen receptor positive breast cancer, accompanied by her husband white. Today is day 1 cycle 3 of 4 planned cycles of cyclophosphamide and doxorubicin, given every 14 days, to be followed by Abraxane weekly 12.   REVIEW OF SYSTEMS: Erika Freeman feels much better this week. She is eating better, sleeping better, and having more normal bowel movements. She has started a walking program. She has had no intercurrent fevers, rash, bleeding, pain, or other possible side effects from her treatment. A detailed review of systems today was entirely stable  BREAST CANCER HISTORY: From the original intake note:  "Erika Freeman" herself noted a mass in her left breast. She ignored it because she thought it was related to menstruation. However as it persisted through 1. She brought it to Dr. [name] attention and on 05/10/2016 she underwent left diagnostic mammography with tomography [facility] The breast density was category C. In the left breast lower central area there was a new circumscribed mass. This was firm and fixed in the lower inner  left breast at middle depth. Ultrasonography confirmed a 2.6 cm hypoechoic irregular mass at the 6:30 o'clock radiant 3 cm from the nipple. There was also a 0.5 cm nodule 1.4 cm medial to the mass just described.  Biopsy of the left breast mass in question 05/10/2016 showed([id number]) invasive ductal carcinoma, grade 3, estrogen receptor 70% positive with weak staining intensity, progesterone receptor negative, with no HER-2 amplification, the signals ratio being 1.52 and the number per cell 2.35. The proliferation marker was 90%.  Her subsequent history is as detailed below  OF NOTE: The patient has a history of focal segmental glomerular sclerosis with end-stage renal disease and is status post unrelated donor renal transplant 04/15/2013 as part of appeared donor exchange with her husband donating one kidney, the other harvested from a 55 year old [place] man. She continues on immunosuppression with mycophenolate and tacrolimus.  PAST MEDICAL HISTORY: Past Medical History:  Diagnosis Date  . Anemia   . Cancer [facility])    breast cancer  . Chills   . Chronic kidney disease   . Cough   . Fibroid tumor   . GERD (gastroesophageal reflux disease)   . Heart murmur   . Hypertension    DX  30 YRS AGO  . Umbilical hernia s/p primary repair 01/10/2012 01/24/2012  . Vomiting     PAST SURGICAL HISTORY: Past Surgical History:  Procedure Laterality Date  . BREAST SURGERY  2012   left - fibroadenoma  . CAPD INSERTION  01/10/2012   Procedure: LAPAROSCOPIC INSERTION CONTINUOUS AMBULATORY PERITONEAL DIALYSIS  (CAPD) CATHETER;  Surgeon: Adin Hector, MD;  Location: [facility];  Service: General;  Laterality: N/A;  LAPAROSCOPIC CAPD CATHETER PLACEMENT OMENTOPEXY  . CESAREAN SECTION  1996  . DILATION  AND CURETTAGE OF UTERUS    . KIDNEY TRANSPLANT    . PARATHYROIDECTOMY  2000 - approximate  . PORTACATH PLACEMENT Right 05/24/2016   Procedure: INSERTION PORT-A-CATH WITH [place];  Surgeon: [name], MD;   Location: [place] [facility];  Service: General;  Laterality: Right;  . RENAL BIOPSY  2011  . UMBILICAL HERNIA REPAIR  01/10/2012   Procedure: HERNIA REPAIR UMBILICAL ADULT;  Surgeon: Adin Hector, MD;  Location: [facility];  Service: General;  Laterality: N/A;    FAMILY HISTORY Family History  Problem Relation Age of Onset  . Cancer Maternal Uncle     lung  . Cancer Cousin     breast  . Cancer Cousin     breast  The patient has very little information regarding her father. Her mother is living, 64 years old as of March 2018. The patient had one full brother, diagnosed with prostate cancer in his 93s. The patient has 2 half-brothers and 1 half-sister. There is no history of breast or ovarian cancer in the family to the patient's knowledge   GYNECOLOGIC HISTORY:  No LMP recorded.  Menarche age 17, first live birth age 59, the patient is GX P3. Her periods are regular, the last 5 or 6 days, with no heavy days. 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 Her husband white works at home [employer]. Son [name] lives in [place] and as Freight forwarder of a Therapist, art. Son [name] lives in [place] and is a Air traffic controller. Son [name] lives in [place] and is studying based trombone. The patient has 2 grandchildren. She attends a local [employer]:  not in place    HEALTH MAINTENANCE: Social History  Substance Use Topics  . Smoking status: Never Smoker  . Smokeless tobacco: Never Used  . Alcohol use No     Colonoscopy:  PAP:  Bone density:   Allergies  Allergen Reactions  . No Known Allergies     Current Outpatient Prescriptions  Medication Sig Dispense Refill  . aspirin 81 MG tablet Take 81 mg by mouth daily.    . cholestyramine (QUESTRAN) 4 g packet Take 1 packet (4 g total) by mouth 3 (three) times daily with meals. 60 each 12  .  cinacalcet (SENSIPAR) 30 MG tablet 1 by mouth on MWF    . dexamethasone (DECADRON) 4 MG tablet Take 2 tablets by mouth once a day on the day after chemotherapy and then take 2 tablets two times a day for 2 days. Take with food. 30 tablet 1  . Iron, Ferrous Sulfate, 142 (45 Fe) MG TBCR Take 1 tablet by mouth daily.    [name] lidocaine-prilocaine (EMLA) cream Apply to affected area once 30 g 3  . loratadine (CLARITIN) 10 MG tablet Take 1 tablet (10 mg total) by mouth daily. 100 tablet 0  . LORazepam (ATIVAN) 0.5 MG tablet Take 1 tablet (0.5 mg total) by mouth at bedtime. 30 tablet 0  . lovastatin (MEVACOR) 20 MG tablet Take 20 mg by mouth every Monday, Wednesday, and Friday.     . magnesium (MAGTAB) 84 MG (7MEQ) TBCR SR tablet Take 168-252 mg by mouth See admin instructions. 2 tabs at [facility], and 3 tabs at 1700    . predniSONE (DELTASONE) 5 MG tablet Take 5 mg by mouth.    . predniSONE 5 MG/5ML solution Take 5 mg  by mouth daily with breakfast.    . prochlorperazine (COMPAZINE) 10 MG tablet TAKE 1 TABLET BY MOUTH EVERY 6 HOURS AS NEEDED FOR NAUSEA OR VOMITING 385 tablet 1  . SENSIPAR 60 MG tablet Take 60 mg by mouth daily.     . tacrolimus (PROGRAF) 1 MG capsule Take 3 mg by mouth 2 (two) times daily.    . tacrolimus (PROGRAF) 1 MG capsule 5 mg in the am and 5 mg the pm     No current facility-administered medications for this visit.     OBJECTIVE: middle-aged African-American woman In no acute distress   Vitals:   06/28/16 0805  BP: 127/80  Pulse: 82  Resp: 18  Temp: 98.2 F (36.8 C)     Body mass index is 24.6 kg/m.    ECOG FS:0 - Asymptomatic  Sclerae unicteric, pupils round and equal Oropharynx clear and moist No cervical or supraclavicular adenopathy Lungs no rales or rhonchi Heart regular rate and rhythm Abd soft, nontender, positive bowel sounds MSK no focal spinal tenderness, no upper extremity lymphedema Neuro: nonfocal, well oriented, appropriate affect Breasts:  Deferred   LAB RESULTS:  CMP     Component Value Date/Time   NA 139 06/22/2016 1502   K 3.5 06/22/2016 1502   CL 108 05/24/2016 1104   CO2 20 (L) 06/22/2016 1502   GLUCOSE 119 06/22/2016 1502   BUN 8.1 06/22/2016 1502   CREATININE 0.8 06/22/2016 1502   CALCIUM 9.5 06/22/2016 1502   PROT 6.7 06/22/2016 1502   ALBUMIN 3.6 06/22/2016 1502   AST 9 06/22/2016 1502   ALT 19 06/22/2016 1502   ALKPHOS 106 06/22/2016 1502   BILITOT 0.29 06/22/2016 1502   GFRNONAA >60 05/24/2016 1104   GFRAA >60 05/24/2016 1104    No results found for: TOTALPROTELP, ALBUMINELP, A1GS, A2GS, BETS, BETA2SER, GAMS, MSPIKE, SPEI  No results found for: KPAFRELGTCHN, LAMBDASER, KAPLAMBRATIO  Lab Results  Component Value Date   WBC 14.7 (H) 06/28/2016   NEUTROABS 12.8 (H) 06/28/2016   HGB 10.5 (L) 06/28/2016   HCT 33.6 (L) 06/28/2016   MCV 83.0 06/28/2016   PLT 215 06/28/2016      Chemistry      Component Value Date/Time   NA 139 06/22/2016 1502   K 3.5 06/22/2016 1502   CL 108 05/24/2016 1104   CO2 20 (L) 06/22/2016 1502   BUN 8.1 06/22/2016 1502   CREATININE 0.8 06/22/2016 1502      Component Value Date/Time   CALCIUM 9.5 06/22/2016 1502   ALKPHOS 106 06/22/2016 1502   AST 9 06/22/2016 1502   ALT 19 06/22/2016 1502   BILITOT 0.29 06/22/2016 1502       No results found for: LABCA2  No components found for: [id number]  No results for input(s): INR in the last 168 hours.  Urinalysis    Component Value Date/Time   COLORURINE YELLOW 11/24/2006 1539   APPEARANCEUR CLEAR 11/24/2006 1539   LABSPEC 1.018 11/24/2006 1539   PHURINE 6.0 11/24/2006 1539   GLUCOSEU NEGATIVE 11/24/2006 1539   HGBUR SMALL (A) 11/24/2006 1539   BILIRUBINUR NEGATIVE 11/24/2006 1539   KETONESUR NEGATIVE 11/24/2006 1539   PROTEINUR 100 (A) 11/24/2006 1539   UROBILINOGEN 1.0 11/24/2006 1539   NITRITE NEGATIVE 11/24/2006 1539   LEUKOCYTESUR MODERATE (A) 11/24/2006 1539     STUDIES: Mm Clip Placement  Left  Result Date: 06/02/2016 CLINICAL DATA:  Status post ultrasound-guided core needle biopsy of a 6 mm mass in the 6:30 o'clock  position of the left breast. EXAM: DIAGNOSTIC LEFT MAMMOGRAM POST STEREOTACTIC BIOPSY COMPARISON:  Previous exam(s). FINDINGS: Mammographic images were obtained following stereotactic guided biopsy of a 6 mm mass in the 6:30 o'clock position of the left breast. These demonstrate a coil shaped biopsy marker clip at the location of the biopsied mass. In the craniocaudal projection, this is located 1.8 cm anteromedial to the previously placed ribbon shaped biopsy marker clip within the biopsy-proven invasive ductal carcinoma in the 6:30 o'clock position of the breast. In the true lateral projection, this is located 1.3 cm anterior and inferior to the previously placed ribbon shaped biopsy marker clip. IMPRESSION: Appropriate clip deployment following left breast ultrasound-guided core needle biopsy. Final Assessment: Post Procedure Mammograms for Marker Placement Electronically Signed   By: Claudie Revering M.D.   On: 06/02/2016 13:53   [place] Lt Breast Bx W Loc Dev 1st Lesion Img Bx Spec US Guide  Addendum Date: 06/13/2016   ADDENDUM REPORT: 06/06/2016 07:11 ADDENDUM: Pathology revealed grade III invasive ductal carcinoma and ductal carcinoma in situ in the left breast. This was found to be concordant by Dr. Claudie Revering. Pathology results were discussed with the patient by telephone. The patient reported doing well after the biopsy. Post biopsy instructions and care were reviewed and questions were answered. The patient was encouraged to call [facility] for any additional concerns. The patient has a recent diagnosis of left breast cancer and should follow her outlined treatment plan. Pathology results reported by Susa Raring RN, BSN on 06/06/2016. Electronically Signed   By: Claudie Revering M.D.   On: 06/06/2016 07:11   Result Date: 06/13/2016 CLINICAL DATA:  5 mm  possible satellite nodule 1.4 cm medial to a recently biopsied invasive ductal carcinoma in the 6:30 o'clock position of the left breast. The patient started neoadjuvant chemotherapy yesterday. EXAM: LEFT BREAST ULTRASOUND GUIDED CORE NEEDLE BIOPSY COMPARISON:  Previous exam(s). FINDINGS: I met with the patient and we discussed the procedure of ultrasound-guided biopsy, including benefits and alternatives. We discussed the high likelihood of a successful procedure. We discussed the risks of the procedure, including infection, bleeding, tissue injury, clip migration, and inadequate sampling. Informed written consent was given. The usual time-out protocol was performed immediately prior to the procedure. Using sterile technique and 1% Lidocaine as local anesthetic, under direct ultrasound visualization, a 12 gauge spring-loaded device was used to perform biopsy of the recently demonstrated 5 mm mass in the 6:30 o'clock position of the left breast. Today, this mass measures 6 x 4 x 3 mm in maximum dimensions with a small bridging connection to the adjacent previously biopsied invasive ductal carcinoma. An inferior approach was used. At the conclusion of the procedure a coil shaped tissue marker clip was deployed into the biopsy cavity. Follow up 2 view mammogram was performed and dictated separately. IMPRESSION: Ultrasound guided biopsy of a 6 mm mass in the 6:30 o'clock position of the left breast, medial to the recently biopsied invasive duct carcinoma. No apparent complications. Electronically Signed: By: Claudie Revering M.D. On: 06/02/2016 13:45    ELIGIBLE FOR AVAILABLE RESEARCH PROTOCOL: no  ASSESSMENT: 56 y.o. [place], [place] woman status post left breast lower inner quadrant biopsy 05/10/2016 for a clinical  T2 N0, prognostic stage IIB invaside ductal carcinoma, grade 3, weekly estrogen receptor positive, progesterone receptor negative, with no HER-2 amplification, and the MIB-1 at 90%  (a) biopsy of the 0.6 mm  satellite at the 6:30 o'clock radiant in the left  breast 06/02/2016 showed invasive ductal carcinoma, grade 3, estrogen receptor 20% positive, with weak staining intensity, progesterone receptor negative with no HER-2 amplification, and an MIB-1 of 70% (identical to larger mass)   (1) neoadjuvant chemotherapy consisting of doxorubicin and cyclophosphamide in dose dense fashion 4 started 06/01/2016, to be followed by Abraxane weekly 12  (2) definitive surgery to follow, with breast conservation preferred if feasible   (a) satellite lesion noted on [place] to be included in final surgery sample  (3) adjuvant radiation as appropriate  (4) anti-estrogens to follow the completion of local treatment  PLAN: Erika Freeman continues to tolerate her chemotherapy remarkably well and she is proceeding to her third cycle of cyclophosphamide and doxorubicin today.  She is taking her supportive medications appropriately and as a result the week after chemotherapy is much easier on her.  At this point I'm making no changes in her treatment. She will see me in a week just to make sure everything continues well and then she will have her final cyclophosphamide and doxorubicin dose 2 weeks from now. After that we will start Abraxane, which will be much less intense  She knows to call for any problems that may develop before the next visit here.   [name], MD   06/28/2016 8:18 AM Medical Oncology and Hematology [facility] [address] [place], [place] [address] Tel. [phone]    Fax. [phone]

## 2016-06-29 ENCOUNTER — Ambulatory Visit: Payer: Medicare Other | Admitting: Oncology

## 2016-07-06 ENCOUNTER — Ambulatory Visit (HOSPITAL_BASED_OUTPATIENT_CLINIC_OR_DEPARTMENT_OTHER): Payer: Medicare Other | Admitting: Oncology

## 2016-07-06 VITALS — BP 128/73 | HR 88 | Temp 97.9°F | Ht 65.0 in | Wt 143.5 lb

## 2016-07-06 DIAGNOSIS — C50312 Malignant neoplasm of lower-inner quadrant of left female breast: Secondary | ICD-10-CM | POA: Diagnosis not present

## 2016-07-06 DIAGNOSIS — Z170 Estrogen receptor positive status [ER+]: Secondary | ICD-10-CM | POA: Diagnosis not present

## 2016-07-06 MED ORDER — NYSTATIN 100000 UNIT/GM EX CREA: 1.0000 "application " | TOPICAL_CREAM | Freq: Two times a day (BID) | CUTANEOUS | 0 refills | Status: DC

## 2016-07-06 NOTE — Progress Notes (Signed)
[place]  [phone] Fax:([phone]) [phone]     ID: Erika Freeman DOB: Nov 09, 1960  MR#: [id number]  XLK#:[id number]  Patient Care Team: [name], MD as PCP - General (Obstetrics and Gynecology) [name], MD as Consulting Physician (Nephrology) [name], MD as Consulting Physician (Obstetrics and Gynecology) [name], MD as Consulting Physician (General Surgery) Fortune Brannigan, [name], MD as Consulting Physician (Oncology) [name], MD as Consulting Physician (Radiation Oncology) [name], MD as Referring Physician (Surgery) [name], DO (Internal Medicine) [name], MD OTHER MD:  CHIEF COMPLAINT: Estrogen receptor positive breast cancer  CURRENT TREATMENT: Neoadjuvant chemotherapy  INTERVAL HISTORY: Erika Freeman returns today for follow-up of her estrogen receptor positive breast cancer accompanied by her husband [name]. Today is day 8 cycle 3 of 4 planned cycles of cyclophosphamide and doxorubicin, given every 14 days, to be followed by Abraxane weekly 12.   REVIEW OF SYSTEMS: Erika Freeman has done remarkably well with her chemotherapy so far but this third cycle was more difficult for her. She says the dexamethasone makes her sick. It upsets her stomach. She has less appetite. She has lost a little weight. She had no vomiting from the chemotherapy. She did have 1 episode of vomiting last night she says related to food getting stuck in her throat. After vomiting and clearing her throat she went and 80 cheese cheeseburger. She has had no mouth sores, no fever, no bleeding, and her port is working well. She controls her bowel movements with Questran. She does not tolerate Imodium. She has some sinus and seasonal allergy symptoms. Aside from these issues a detailed review of systems today was stable  BREAST CANCER HISTORY: From the original intake note:  "Erika Freeman" herself noted a mass in her left breast. She  ignored it because she thought it was related to menstruation. However as it persisted through 1. She brought it to Dr. [name] attention and on 05/10/2016 she underwent left diagnostic mammography with tomography and [facility] The breast density was category C. In the left breast lower central area there was a new circumscribed mass. This was firm and fixed in the lower inner left breast at middle depth. Ultrasonography confirmed a 2.6 cm hypoechoic irregular mass at the 6:30 o'clock radiant 3 cm from the nipple. There was also a 0.5 cm nodule 1.4 cm medial to the mass just described.  Biopsy of the left breast mass in question 05/10/2016 showed([id number]) invasive ductal carcinoma, grade 3, estrogen receptor 70% positive with weak staining intensity, progesterone receptor negative, with no HER-2 amplification, the signals ratio being 1.52 and the number per cell 2.35. The proliferation marker was 90%.  Her subsequent history is as detailed below  OF NOTE: The patient has a history of focal segmental glomerular sclerosis with end-stage renal disease and is status post unrelated donor renal transplant 04/15/2013 as part of appeared donor exchange with her husband donating one kidney, the other harvested from a 55 year old [place] man. She continues on immunosuppression with mycophenolate and tacrolimus.  PAST MEDICAL HISTORY: Past Medical History:  Diagnosis Date  . Anemia   . Cancer [facility])    breast cancer  . Chills   . Chronic kidney disease   . Cough   . Fibroid tumor   . GERD (gastroesophageal reflux disease)   . Heart murmur   . Hypertension    DX  30 YRS AGO  . Umbilical hernia s/p primary repair 01/10/2012 01/24/2012  .  Vomiting     PAST SURGICAL HISTORY: Past Surgical History:  Procedure Laterality Date  . BREAST SURGERY  2012   left - fibroadenoma  . CAPD INSERTION  01/10/2012   Procedure: LAPAROSCOPIC INSERTION CONTINUOUS AMBULATORY  PERITONEAL DIALYSIS  (CAPD) CATHETER;  Surgeon: Adin Hector, MD;  Location: [place];  Service: General;  Laterality: N/A;  LAPAROSCOPIC CAPD CATHETER PLACEMENT OMENTOPEXY  . CESAREAN SECTION  1996  . DILATION AND CURETTAGE OF UTERUS    . KIDNEY TRANSPLANT    . PARATHYROIDECTOMY  2000 - approximate  . PORTACATH PLACEMENT Right 05/24/2016   Procedure: INSERTION PORT-A-CATH WITH [place];  Surgeon: [name], MD;  Location: [place];  Service: General;  Laterality: Right;  . RENAL BIOPSY  2011  . UMBILICAL HERNIA REPAIR  01/10/2012   Procedure: HERNIA REPAIR UMBILICAL ADULT;  Surgeon: Adin Hector, MD;  Location: [place];  Service: General;  Laterality: N/A;    FAMILY HISTORY Family History  Problem Relation Age of Onset  . Cancer Maternal Uncle     lung  . Cancer Cousin     breast  . Cancer Cousin     breast  The patient has very little information regarding her father. Her mother is living, 56 years old as of March 2018. The patient had one full brother, diagnosed with prostate cancer in his 63s. The patient has 2 half-brothers and 1 half-sister. There is no history of breast or ovarian cancer in the family to the patient's knowledge   GYNECOLOGIC HISTORY:  No LMP recorded.  Menarche age 20, first live birth age 51, the patient is GX P3. Her periods are regular, the last 5 or 6 days, with no heavy days. 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 Her husband white works at home [employer]. Son [name] lives in [place] and as Freight forwarder of a Therapist, art. Son [name] lives in [place] and is a Air traffic controller. Son [name] lives in [place] and is studying based trombone. The patient has 2 grandchildren. She attends a local [employer]:  not in place    HEALTH MAINTENANCE: Social History  Substance Use Topics  . Smoking status:  Never Smoker  . Smokeless tobacco: Never Used  . Alcohol use No     Colonoscopy:  PAP:  Bone density:   Allergies  Allergen Reactions  . No Known Allergies     Current Outpatient Prescriptions  Medication Sig Dispense Refill  . aspirin 81 MG tablet Take 81 mg by mouth daily.    . cholestyramine (QUESTRAN) 4 g packet Take 1 packet (4 g total) by mouth 3 (three) times daily with meals. 60 each 12  . dexamethasone (DECADRON) 4 MG tablet Take 2 tablets by mouth once a day on the day after chemotherapy and then take 2 tablets two times a day for 2 days. Take with food. 30 tablet 1  . Iron, Ferrous Sulfate, 142 (45 Fe) MG TBCR Take 1 tablet by mouth daily.    [name] lidocaine-prilocaine (EMLA) cream Apply to affected area once 30 g 3  . loratadine (CLARITIN) 10 MG tablet Take 1 tablet (10 mg total) by mouth daily. 100 tablet 0  . LORazepam (ATIVAN) 0.5 MG tablet Take 1 tablet (0.5 mg total) by mouth at bedtime. 30 tablet 0  . lovastatin (MEVACOR) 20 MG tablet Take 20 mg by  mouth every Monday, Wednesday, and Friday.     . potassium chloride (MICRO-K) 10 MEQ CR capsule TAKE ONE CAPSULE BY MOUTH DAILY 90 capsule 3  . predniSONE (DELTASONE) 5 MG tablet Take 5 mg by mouth.    . prochlorperazine (COMPAZINE) 10 MG tablet TAKE 1 TABLET BY MOUTH EVERY 6 HOURS AS NEEDED FOR NAUSEA OR VOMITING 385 tablet 1  . SENSIPAR 60 MG tablet Take 60 mg by mouth daily.     . tacrolimus (PROGRAF) 1 MG capsule Take 3 mg by mouth 2 (two) times daily.     No current facility-administered medications for this visit.     OBJECTIVE: middle-aged African-American womanWho appears stated age   74:   07/06/16 1223  BP: 128/73  Pulse: 88  Temp: 97.9 F (36.6 C)     Body mass index is 23.88 kg/m.    ECOG FS:1 - Symptomatic but completely ambulatory  Sclerae unicteric, EOMs intact Oropharynx clear and moist No cervical or supraclavicular adenopathy Lungs no rales or rhonchi Heart regular rate and rhythm Abd  soft, nontender, positive bowel sounds MSK no focal spinal tenderness, no upper extremity lymphedema Neuro: nonfocal, well oriented, appropriate affect Breasts: Deferred  LAB RESULTS:  CMP     Component Value Date/Time   NA 142 06/28/2016 0754   K 3.1 (L) 06/28/2016 0754   CL 108 05/24/2016 1104   CO2 25 06/28/2016 0754   GLUCOSE 99 06/28/2016 0754   BUN 13.8 06/28/2016 0754   CREATININE 0.9 06/28/2016 0754   CALCIUM 9.3 06/28/2016 0754   PROT 6.5 06/28/2016 0754   ALBUMIN 3.8 06/28/2016 0754   AST 14 06/28/2016 0754   ALT 19 06/28/2016 0754   ALKPHOS 116 06/28/2016 0754   BILITOT 0.28 06/28/2016 0754   GFRNONAA >60 05/24/2016 1104   GFRAA >60 05/24/2016 1104    No results found for: TOTALPROTELP, ALBUMINELP, A1GS, A2GS, BETS, BETA2SER, GAMS, MSPIKE, SPEI  No results found for: KPAFRELGTCHN, LAMBDASER, KAPLAMBRATIO  Lab Results  Component Value Date   WBC 14.7 (H) 06/28/2016   NEUTROABS 12.8 (H) 06/28/2016   HGB 10.5 (L) 06/28/2016   HCT 33.6 (L) 06/28/2016   MCV 83.0 06/28/2016   PLT 215 06/28/2016      Chemistry      Component Value Date/Time   NA 142 06/28/2016 0754   K 3.1 (L) 06/28/2016 0754   CL 108 05/24/2016 1104   CO2 25 06/28/2016 0754   BUN 13.8 06/28/2016 0754   CREATININE 0.9 06/28/2016 0754      Component Value Date/Time   CALCIUM 9.3 06/28/2016 0754   ALKPHOS 116 06/28/2016 0754   AST 14 06/28/2016 0754   ALT 19 06/28/2016 0754   BILITOT 0.28 06/28/2016 0754       No results found for: LABCA2  No components found for: IWLNLG921  No results for input(s): INR in the last 168 hours.  Urinalysis    Component Value Date/Time   COLORURINE YELLOW 11/24/2006 1539   APPEARANCEUR CLEAR 11/24/2006 1539   LABSPEC 1.018 11/24/2006 1539   PHURINE 6.0 11/24/2006 1539   GLUCOSEU NEGATIVE 11/24/2006 1539   HGBUR SMALL (A) 11/24/2006 1539   BILIRUBINUR NEGATIVE 11/24/2006 1539   KETONESUR NEGATIVE 11/24/2006 1539   PROTEINUR 100 (A)  11/24/2006 1539   UROBILINOGEN 1.0 11/24/2006 1539   NITRITE NEGATIVE 11/24/2006 1539   LEUKOCYTESUR MODERATE (A) 11/24/2006 1539     STUDIES: No results found.  ELIGIBLE FOR AVAILABLE RESEARCH PROTOCOL: no  ASSESSMENT: 56 y.o. Merlyn Albert,  [place] woman status post left breast lower inner quadrant biopsy 05/10/2016 for a clinical  T2 N0, prognostic stage IIB invaside ductal carcinoma, grade 3, weekly estrogen receptor positive, progesterone receptor negative, with no HER-2 amplification, and the MIB-1 at 90%  (a) biopsy of the 0.6 mm satellite at the 6:30 o'clock radiant in the left breast 06/02/2016 showed invasive ductal carcinoma, grade 3, estrogen receptor 20% positive, with weak staining intensity, progesterone receptor negative with no HER-2 amplification, and an MIB-1 of 70% (identical to larger mass)   (1) neoadjuvant chemotherapy consisting of doxorubicin and cyclophosphamide in dose dense fashion 4 started 06/01/2016, to be followed by Abraxane weekly 12  (2) definitive surgery to follow, with breast conservation preferred if feasible   (a) satellite lesion noted on [place] to be included in final surgery sample  (3) adjuvant radiation as appropriate  (4) anti-estrogens to follow the completion of local treatment  PLAN: Erika Freeman is beginning to show more symptoms related to her chemotherapy and I think it would be prudent to hold off on her final cycle for 1 week. I assured her this would not affect the ultimate results, which we expect to be good.  However she is very motivated, once to "get done with this part" and get on with a "easier part" namely the Abraxane weekly.  Accordingly the plan is still for treatment next week assuming she continues to feel that way and has had some improvement in her symptoms.  Just in case I am scheduling him for fluids on days 2 and 3 of this final fourth cycle so that if she needs additional support we will be able to provide.  We are going to omit  the dexamethasone at her request. Instead she will take Compazine before meals and at bedtime for the first 2 days. On day 3 she can add ondansetron. All those prescriptions were written and the instructions were given to her in writing.  She'll see [place] again the day of treatment and a week after. On the day 8 cycle for visit I will oriented her to the upcoming Abraxane therapy.  She knows to call for any other issues that may develop before her next visit.  [name], MD   07/06/2016 12:35 PM Medical Oncology and Hematology [facility] [address] [place], [place] [address] Tel. [phone]    Fax. [phone]

## 2016-07-07 DIAGNOSIS — B379 Candidiasis, unspecified: Secondary | ICD-10-CM | POA: Diagnosis not present

## 2016-07-07 DIAGNOSIS — Z5181 Encounter for therapeutic drug level monitoring: Secondary | ICD-10-CM | POA: Diagnosis not present

## 2016-07-07 DIAGNOSIS — I120 Hypertensive chronic kidney disease with stage 5 chronic kidney disease or end stage renal disease: Secondary | ICD-10-CM | POA: Diagnosis not present

## 2016-07-07 DIAGNOSIS — Z992 Dependence on renal dialysis: Secondary | ICD-10-CM | POA: Diagnosis not present

## 2016-07-07 DIAGNOSIS — Z7982 Long term (current) use of aspirin: Secondary | ICD-10-CM | POA: Diagnosis not present

## 2016-07-07 DIAGNOSIS — N186 End stage renal disease: Secondary | ICD-10-CM | POA: Diagnosis not present

## 2016-07-07 DIAGNOSIS — C50919 Malignant neoplasm of unspecified site of unspecified female breast: Secondary | ICD-10-CM | POA: Diagnosis not present

## 2016-07-07 DIAGNOSIS — Z940 Kidney transplant status: Secondary | ICD-10-CM | POA: Diagnosis not present

## 2016-07-07 DIAGNOSIS — E869 Volume depletion, unspecified: Secondary | ICD-10-CM | POA: Diagnosis not present

## 2016-07-07 DIAGNOSIS — Z79899 Other long term (current) drug therapy: Secondary | ICD-10-CM | POA: Diagnosis not present

## 2016-07-07 DIAGNOSIS — Z4822 Encounter for aftercare following kidney transplant: Secondary | ICD-10-CM | POA: Diagnosis not present

## 2016-07-07 DIAGNOSIS — E212 Other hyperparathyroidism: Secondary | ICD-10-CM | POA: Diagnosis not present

## 2016-07-07 DIAGNOSIS — Z9889 Other specified postprocedural states: Secondary | ICD-10-CM | POA: Diagnosis not present

## 2016-07-07 DIAGNOSIS — D899 Disorder involving the immune mechanism, unspecified: Secondary | ICD-10-CM | POA: Diagnosis not present

## 2016-07-12 ENCOUNTER — Ambulatory Visit (HOSPITAL_BASED_OUTPATIENT_CLINIC_OR_DEPARTMENT_OTHER): Payer: Medicare Other | Admitting: Oncology

## 2016-07-12 ENCOUNTER — Telehealth: Payer: Self-pay | Admitting: *Deleted

## 2016-07-12 ENCOUNTER — Ambulatory Visit (HOSPITAL_BASED_OUTPATIENT_CLINIC_OR_DEPARTMENT_OTHER): Payer: Medicare Other

## 2016-07-12 ENCOUNTER — Other Ambulatory Visit (HOSPITAL_BASED_OUTPATIENT_CLINIC_OR_DEPARTMENT_OTHER): Payer: Medicare Other

## 2016-07-12 VITALS — BP 136/82 | HR 85 | Temp 98.0°F | Resp 19 | Ht 65.0 in | Wt 144.8 lb

## 2016-07-12 DIAGNOSIS — C50312 Malignant neoplasm of lower-inner quadrant of left female breast: Secondary | ICD-10-CM | POA: Diagnosis not present

## 2016-07-12 DIAGNOSIS — Z5111 Encounter for antineoplastic chemotherapy: Secondary | ICD-10-CM | POA: Diagnosis not present

## 2016-07-12 DIAGNOSIS — L02423 Furuncle of right upper limb: Secondary | ICD-10-CM

## 2016-07-12 DIAGNOSIS — N051 Unspecified nephritic syndrome with focal and segmental glomerular lesions: Secondary | ICD-10-CM

## 2016-07-12 DIAGNOSIS — N185 Chronic kidney disease, stage 5: Secondary | ICD-10-CM

## 2016-07-12 DIAGNOSIS — Z170 Estrogen receptor positive status [ER+]: Principal | ICD-10-CM

## 2016-07-12 LAB — CBC WITH DIFFERENTIAL/PLATELET
BASO%: 0.9 % (ref 0.0–2.0)
Basophils Absolute: 0.1 10*3/uL (ref 0.0–0.1)
EOS%: 0.1 % (ref 0.0–7.0)
Eosinophils Absolute: 0 10*3/uL (ref 0.0–0.5)
HEMATOCRIT: 33.7 % — AB (ref 34.8–46.6)
HGB: 10.7 g/dL — ABNORMAL LOW (ref 11.6–15.9)
LYMPH#: 0.6 10*3/uL — AB (ref 0.9–3.3)
LYMPH%: 5.3 % — ABNORMAL LOW (ref 14.0–49.7)
MCH: 26.6 pg (ref 25.1–34.0)
MCHC: 31.7 g/dL (ref 31.5–36.0)
MCV: 83.8 fL (ref 79.5–101.0)
MONO#: 0.5 10*3/uL (ref 0.1–0.9)
MONO%: 4.1 % (ref 0.0–14.0)
NEUT#: 11 10*3/uL — ABNORMAL HIGH (ref 1.5–6.5)
NEUT%: 89.6 % — AB (ref 38.4–76.8)
PLATELETS: 147 10*3/uL (ref 145–400)
RBC: 4.03 10*6/uL (ref 3.70–5.45)
RDW: 19.2 % — ABNORMAL HIGH (ref 11.2–14.5)
WBC: 12.3 10*3/uL — ABNORMAL HIGH (ref 3.9–10.3)

## 2016-07-12 LAB — COMPREHENSIVE METABOLIC PANEL
ALBUMIN: 3.8 g/dL (ref 3.5–5.0)
ALT: 17 U/L (ref 0–55)
ANION GAP: 11 meq/L (ref 3–11)
AST: 12 U/L (ref 5–34)
Alkaline Phosphatase: 122 U/L (ref 40–150)
BUN: 14.8 mg/dL (ref 7.0–26.0)
CALCIUM: 9.1 mg/dL (ref 8.4–10.4)
CHLORIDE: 108 meq/L (ref 98–109)
CO2: 23 meq/L (ref 22–29)
CREATININE: 0.9 mg/dL (ref 0.6–1.1)
EGFR: 89 mL/min/{1.73_m2} — ABNORMAL LOW (ref >90)
Glucose: 99 mg/dl (ref 70–140)
Potassium: 3.2 mEq/L — ABNORMAL LOW (ref 3.5–5.1)
Sodium: 142 mEq/L (ref 136–145)
Total Bilirubin: 0.28 mg/dL (ref 0.20–1.20)
Total Protein: 6.4 g/dL (ref 6.4–8.3)

## 2016-07-12 LAB — TECHNOLOGIST REVIEW: Technologist Review: 2

## 2016-07-12 MED ORDER — PEGFILGRASTIM 6 MG/0.6ML ~~LOC~~ PSKT
6.0000 mg | PREFILLED_SYRINGE | Freq: Once | SUBCUTANEOUS | Status: AC
  Administered 2016-07-12: 6 mg via SUBCUTANEOUS
  Filled 2016-07-12: qty 0.6

## 2016-07-12 MED ORDER — HEPARIN SOD (PORK) LOCK FLUSH 100 UNIT/ML IV SOLN
500.0000 [IU] | Freq: Once | INTRAVENOUS | Status: AC | PRN
  Administered 2016-07-12: 500 [IU]
  Filled 2016-07-12: qty 5

## 2016-07-12 MED ORDER — PALONOSETRON HCL INJECTION 0.25 MG/5ML
0.2500 mg | Freq: Once | INTRAVENOUS | Status: AC
  Administered 2016-07-12: 0.25 mg via INTRAVENOUS

## 2016-07-12 MED ORDER — SODIUM CHLORIDE 0.9% FLUSH
10.0000 mL | INTRAVENOUS | Status: DC | PRN
  Administered 2016-07-12: 10 mL
  Filled 2016-07-12: qty 10

## 2016-07-12 MED ORDER — PALONOSETRON HCL INJECTION 0.25 MG/5ML
INTRAVENOUS | Status: AC
  Filled 2016-07-12: qty 5

## 2016-07-12 MED ORDER — DOXORUBICIN HCL CHEMO IV INJECTION 2 MG/ML
60.0000 mg/m2 | Freq: Once | INTRAVENOUS | Status: AC
  Administered 2016-07-12: 110 mg via INTRAVENOUS
  Filled 2016-07-12: qty 55

## 2016-07-12 MED ORDER — SODIUM CHLORIDE 0.9 % IV SOLN
600.0000 mg/m2 | Freq: Once | INTRAVENOUS | Status: AC
  Administered 2016-07-12: 1100 mg via INTRAVENOUS
  Filled 2016-07-12: qty 55

## 2016-07-12 MED ORDER — SODIUM CHLORIDE 0.9 % IV SOLN
Freq: Once | INTRAVENOUS | Status: AC
  Administered 2016-07-12: 09:00:00 via INTRAVENOUS

## 2016-07-12 MED ORDER — SODIUM CHLORIDE 0.9 % IV SOLN
Freq: Once | INTRAVENOUS | Status: AC
  Administered 2016-07-12: 09:00:00 via INTRAVENOUS
  Filled 2016-07-12: qty 5

## 2016-07-12 NOTE — Telephone Encounter (Signed)
This RN contacted [facility] to obtain an appointment with Dr Donne Hazel per MD request due to " boil that needs to be lanced"  Obtained an appointment for 930 am tomorrow with Dr Donne Hazel.  Contacted pt who verbalized understanding of above appointment,.

## 2016-07-12 NOTE — Progress Notes (Signed)
[place]  [phone] [phone]     ID: ALANIS CLIFT DOB: 09-05-1960  MR#: [id number]  BRA#:[id number]  Patient Care Team: [name], MD as PCP - General (Obstetrics and Gynecology) [name], MD as Consulting Physician (Nephrology) [name], MD as Consulting Physician (Obstetrics and Gynecology) [name], MD as Consulting Physician (General Surgery) Magrinat, [name], MD as Consulting Physician (Oncology) [name], MD as Consulting Physician (Radiation Oncology) [name], MD as Referring Physician (Surgery) [name], DO (Internal Medicine) [name], MD OTHER MD:  CHIEF COMPLAINT: Estrogen receptor positive breast cancer  CURRENT TREATMENT: Neoadjuvant chemotherapy  INTERVAL HISTORY: Erika Freeman returns today for follow-up of her estrogen receptor positive breast cancer accompanied by her husband [name]. Today is day 1 cycle 4 of 4 planned cycles of cyclophosphamide and doxorubicin given in dose dense fashion 4, to be followed by weekly Abraxane 12.  REVIEW OF SYSTEMS: [name] was looking somewhat frail last time I saw her a week ago but she has made a wonderful recovery. She has more energy. Her appetite is good. She saw her nephrologist at [facility] and she was started on Diflucan. She feels had really made a difference to her throat. She is having normal bowel movements, still using the Questran. She is drinking plenty of fluids and does not think she needs fluids tomorrow. She has a "bump" in the right posterior shoulder she wanted me to look at. A detailed review of systems today was otherwise stable  BREAST CANCER HISTORY: From the original intake note:  "[name]" herself noted a mass in her left breast. She ignored it because she thought it was related to menstruation. However as it persisted through 1. She brought it to Dr. [name] attention and on 05/10/2016 she underwent  left diagnostic mammography with tomography and [facility] The breast density was category C. In the left breast lower central area there was a new circumscribed mass. This was firm and fixed in the lower inner left breast at middle depth. Ultrasonography confirmed a 2.6 cm hypoechoic irregular mass at the 6:30 o'clock radiant 3 cm from the nipple. There was also a 0.5 cm nodule 1.4 cm medial to the mass just described.  Biopsy of the left breast mass in question 05/10/2016 showed([id number]) invasive ductal carcinoma, grade 3, estrogen receptor 70% positive with weak staining intensity, progesterone receptor negative, with no HER-2 amplification, the signals ratio being 1.52 and the number per cell 2.35. The proliferation marker was 90%.  Her subsequent history is as detailed below  OF NOTE: The patient has a history of focal segmental glomerular sclerosis with end-stage renal disease and is status post unrelated donor renal transplant 04/15/2013 as part of appeared donor exchange with her husband donating one kidney, the other harvested from a 55 year old [place] man. She continues on immunosuppression with mycophenolate and tacrolimus.  PAST MEDICAL HISTORY: Past Medical History:  Diagnosis Date  . Anemia   . Cancer [facility])    breast cancer  . Chills   . Chronic kidney disease   . Cough   . Fibroid tumor   . GERD (gastroesophageal reflux disease)   . Heart murmur   . Hypertension    DX  30 YRS AGO  . Umbilical hernia s/p primary repair 01/10/2012 01/24/2012  . Vomiting     PAST SURGICAL HISTORY: Past Surgical History:  Procedure Laterality Date  . BREAST SURGERY  2012   left -  fibroadenoma  . CAPD INSERTION  01/10/2012   Procedure: LAPAROSCOPIC INSERTION CONTINUOUS AMBULATORY PERITONEAL DIALYSIS  (CAPD) CATHETER;  Surgeon: Adin Hector, MD;  Location: [place];  Service: General;  Laterality: N/A;  LAPAROSCOPIC CAPD CATHETER PLACEMENT OMENTOPEXY    . CESAREAN SECTION  1996  . DILATION AND CURETTAGE OF UTERUS    . KIDNEY TRANSPLANT    . PARATHYROIDECTOMY  2000 - approximate  . PORTACATH PLACEMENT Right 05/24/2016   Procedure: INSERTION PORT-A-CATH WITH [place];  Surgeon: [name], MD;  Location: [place];  Service: General;  Laterality: Right;  . RENAL BIOPSY  2011  . UMBILICAL HERNIA REPAIR  01/10/2012   Procedure: HERNIA REPAIR UMBILICAL ADULT;  Surgeon: Adin Hector, MD;  Location: [place];  Service: General;  Laterality: N/A;    FAMILY HISTORY Family History  Problem Relation Age of Onset  . Cancer Maternal Uncle        lung  . Cancer Cousin        breast  . Cancer Cousin        breast  The patient has very little information regarding her father. Her mother is living, 83 years old as of March 2018. The patient had one full brother, diagnosed with prostate cancer in his 73s. The patient has 2 half-brothers and 1 half-sister. There is no history of breast or ovarian cancer in the family to the patient's knowledge   GYNECOLOGIC HISTORY:  No LMP recorded.  Menarche age 52, first live birth age 30, the patient is GX P3. Her periods are regular, the last 5 or 6 days, with no heavy days. 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 Her husband white works at home [employer]. Son [name] lives in [place] and as Freight forwarder of a Therapist, art. Son [name] lives in nights [place] and is a Air traffic controller. Son [name] lives in [place] and is studying based trombone. The patient has 2 grandchildren. She attends a local [employer]:  not in place    HEALTH MAINTENANCE: Social History  Substance Use Topics  . Smoking status: Never Smoker  . Smokeless tobacco: Never Used  . Alcohol use No     Colonoscopy:  PAP:  Bone density:   Allergies  Allergen Reactions  . No Known  Allergies     Current Outpatient Prescriptions  Medication Sig Dispense Refill  . aspirin 81 MG tablet Take 81 mg by mouth daily.    . cholestyramine (QUESTRAN) 4 g packet Take 1 packet (4 g total) by mouth 3 (three) times daily with meals. 60 each 12  . Iron, Ferrous Sulfate, 142 (45 Fe) MG TBCR Take 1 tablet by mouth daily.    [name] lidocaine-prilocaine (EMLA) cream Apply to affected area once 30 g 3  . loratadine (CLARITIN) 10 MG tablet Take 1 tablet (10 mg total) by mouth daily. 100 tablet 0  . LORazepam (ATIVAN) 0.5 MG tablet Take 1 tablet (0.5 mg total) by mouth at bedtime. 30 tablet 0  . lovastatin (MEVACOR) 20 MG tablet Take 20 mg by mouth every Monday, Wednesday, and Friday.     . nystatin cream (MYCOSTATIN) Apply 1 application topically 2 (two) times daily. 30 g 0  . potassium chloride (MICRO-K) 10 MEQ CR capsule TAKE ONE CAPSULE BY MOUTH DAILY 90 capsule 3  . predniSONE (DELTASONE) 5 MG tablet Take 5 mg  by mouth.    . prochlorperazine (COMPAZINE) 10 MG tablet TAKE 1 TABLET BY MOUTH EVERY 6 HOURS AS NEEDED FOR NAUSEA OR VOMITING 385 tablet 1  . SENSIPAR 60 MG tablet Take 60 mg by mouth daily.     . tacrolimus (PROGRAF) 1 MG capsule Take 3 mg by mouth 2 (two) times daily.     No current facility-administered medications for this visit.     OBJECTIVE: middle-aged African-American woman in no acute distress Vitals:   07/12/16 0808  BP: 136/82  Pulse: 85  Resp: 19  Temp: 98 F (36.7 C)     Body mass index is 24.1 kg/m.    ECOG FS:1 - Symptomatic but completely ambulatory  Sclerae unicteric, pupils round and equal Oropharynx clear and moist No cervical or supraclavicular adenopathy Lungs no rales or rhonchi Heart regular rate and rhythm Abd soft, nontender, positive bowel sounds MSK no focal spinal tenderness, no upper extremity lymphedema Neuro: nonfocal, well oriented, appropriate affect Breasts: Deferred Skin: She has a small boil in the right posterior shoulder,  measuring approximately a centimeter and a half, dusky, tender, not pointing.  LAB RESULTS:  CMP     Component Value Date/Time   NA 142 06/28/2016 0754   K 3.1 (L) 06/28/2016 0754   CL 108 05/24/2016 1104   CO2 25 06/28/2016 0754   GLUCOSE 99 06/28/2016 0754   BUN 13.8 06/28/2016 0754   CREATININE 0.9 06/28/2016 0754   CALCIUM 9.3 06/28/2016 0754   PROT 6.5 06/28/2016 0754   ALBUMIN 3.8 06/28/2016 0754   AST 14 06/28/2016 0754   ALT 19 06/28/2016 0754   ALKPHOS 116 06/28/2016 0754   BILITOT 0.28 06/28/2016 0754   GFRNONAA >60 05/24/2016 1104   GFRAA >60 05/24/2016 1104    No results found for: TOTALPROTELP, ALBUMINELP, A1GS, A2GS, BETS, BETA2SER, GAMS, MSPIKE, SPEI  No results found for: KPAFRELGTCHN, LAMBDASER, [facility]  Lab Results  Component Value Date   WBC 12.3 (H) 07/12/2016   NEUTROABS 11.0 (H) 07/12/2016   HGB 10.7 (L) 07/12/2016   HCT 33.7 (L) 07/12/2016   MCV 83.8 07/12/2016   PLT 147 07/12/2016      Chemistry      Component Value Date/Time   NA 142 06/28/2016 0754   K 3.1 (L) 06/28/2016 0754   CL 108 05/24/2016 1104   CO2 25 06/28/2016 0754   BUN 13.8 06/28/2016 0754   CREATININE 0.9 06/28/2016 0754      Component Value Date/Time   CALCIUM 9.3 06/28/2016 0754   ALKPHOS 116 06/28/2016 0754   AST 14 06/28/2016 0754   ALT 19 06/28/2016 0754   BILITOT 0.28 06/28/2016 0754       No results found for: LABCA2  No components found for: [id number]  No results for input(s): INR in the last 168 hours.  Urinalysis    Component Value Date/Time   COLORURINE YELLOW 11/24/2006 1539   APPEARANCEUR CLEAR 11/24/2006 1539   LABSPEC 1.018 11/24/2006 1539   PHURINE 6.0 11/24/2006 1539   GLUCOSEU NEGATIVE 11/24/2006 1539   HGBUR SMALL (A) 11/24/2006 1539   BILIRUBINUR NEGATIVE 11/24/2006 1539   KETONESUR NEGATIVE 11/24/2006 1539   PROTEINUR 100 (A) 11/24/2006 1539   UROBILINOGEN 1.0 11/24/2006 1539   NITRITE NEGATIVE 11/24/2006 1539   LEUKOCYTESUR  MODERATE (A) 11/24/2006 1539     STUDIES: No results found.  ELIGIBLE FOR AVAILABLE RESEARCH PROTOCOL: no  ASSESSMENT: 56 y.o. [place] woman status post left breast lower inner quadrant biopsy 05/10/2016  for a clinical  T2 N0, prognostic stage IIB invaside ductal carcinoma, grade 3, weekly estrogen receptor positive, progesterone receptor negative, with no HER-2 amplification, and the MIB-1 at 90%  (a) biopsy of the 0.6 mm satellite at the 6:30 o'clock radiant in the left breast 06/02/2016 showed invasive ductal carcinoma, grade 3, estrogen receptor 20% positive, with weak staining intensity, progesterone receptor negative with no HER-2 amplification, and an MIB-1 of 70% (identical to larger mass)   (1) neoadjuvant chemotherapy consisting of doxorubicin and cyclophosphamide in dose dense fashion 4 started 06/01/2016, to be followed by Abraxane weekly 12  (2) definitive surgery to follow, with breast conservation preferred if feasible   (a) satellite lesion noted on [place] to be included in final surgery sample  (3) adjuvant radiation as appropriate  (4) anti-estrogens to follow the completion of local treatment  PLAN: [name] has recovered from her last treatment and is ready and eager to get her final cycle of cyclophosphamide and doxorubicin over. Her counts are excellent. We are proceeding with treatment.  She benefited from the [place] she received from her nephrologist. I am refilling that in case she needs it.  She has a small boil in the right posterior shoulder. She has had these in the past. Ideally this would be lanced and drained and I will see if her surgeon or one of his partners can do that today or tomorrow. In the meantime I am starting her on cephalexin which she will take for the next 5 days.   I have scheduled her for fluids tomorrow on the day after just in case but she feels she will not need him. If so she will call and cancel.  She already has an appointment  with me next week. She will call with any problems that may develop before then.  [name], MD   07/12/2016 8:25 AM Medical Oncology and Hematology [facility] [address] [place], [place] [address] Tel. [phone]    Fax. [phone]

## 2016-07-12 NOTE — Telephone Encounter (Signed)
Received call from pt to cancel IVF on 5/16 and 5/17. Relate did not need. Cancel appts per pt request

## 2016-07-12 NOTE — Patient Instructions (Signed)
[place] Discharge Instructions for Patients Receiving Chemotherapy  Today you received the following chemotherapy agents Doxyrubicin/Cytoxan  To help prevent nausea and vomiting after your treatment, we encourage you to take your nausea medication     If you develop nausea and vomiting that is not controlled by your nausea medication, call the clinic.   BELOW ARE SYMPTOMS THAT SHOULD BE REPORTED IMMEDIATELY:  *FEVER GREATER THAN 100.5 F  *CHILLS WITH OR WITHOUT FEVER  NAUSEA AND VOMITING THAT IS NOT CONTROLLED WITH YOUR NAUSEA MEDICATION  *UNUSUAL SHORTNESS OF BREATH  *UNUSUAL BRUISING OR BLEEDING  TENDERNESS IN MOUTH AND THROAT WITH OR WITHOUT PRESENCE OF ULCERS  *URINARY PROBLEMS  *BOWEL PROBLEMS  UNUSUAL RASH Items with * indicate a potential emergency and should be followed up as soon as possible.  Feel free to call the clinic you have any questions or concerns. The clinic phone number is [phone].  Please show the [place] at check-in to the Emergency Department and triage nurse.

## 2016-07-13 ENCOUNTER — Ambulatory Visit: Payer: Medicare Other

## 2016-07-13 ENCOUNTER — Ambulatory Visit: Payer: Medicare Other | Admitting: Oncology

## 2016-07-13 ENCOUNTER — Telehealth: Payer: Self-pay | Admitting: *Deleted

## 2016-07-13 DIAGNOSIS — L723 Sebaceous cyst: Secondary | ICD-10-CM | POA: Diagnosis not present

## 2016-07-13 NOTE — Telephone Encounter (Signed)
This RN returned call to per her VM left on Navigator's phone.  Per call - Erika Freeman states " my onpro wasn't acting right "  Erika Freeman states that approximately an hour ago " it beeped a long beep and then was flashing- but it didn't read empty"  " when my husband came home around 2ish - it read like 1/2 way down - so I took it off"  While on the phone with this RN at 233 pm long beep heard with pt stating " oh - that was the onpro "  Per discussion with MD per above - and review of recent labs with probable administration of 1/2 of neulasta dose- plan is to not give additional growth factor at this time - check labs in one week and follow up at that time.  Pt completed A/C component of chemotherapy requiring growth factors.  This RN returned call to pt and discussed the above. Appointment scheduled for lab next week for review and any further recommendations.

## 2016-07-14 ENCOUNTER — Ambulatory Visit: Payer: Medicare Other

## 2016-07-15 ENCOUNTER — Encounter (HOSPITAL_COMMUNITY): Payer: Self-pay | Admitting: Emergency Medicine

## 2016-07-15 ENCOUNTER — Emergency Department (HOSPITAL_COMMUNITY)
Admission: EM | Admit: 2016-07-15 | Discharge: 2016-07-15 | Disposition: A | Discharge status: Home / Self Care | Payer: Medicare Other | Attending: Emergency Medicine | Admitting: Emergency Medicine

## 2016-07-15 DIAGNOSIS — Z7982 Long term (current) use of aspirin: Secondary | ICD-10-CM | POA: Insufficient documentation

## 2016-07-15 DIAGNOSIS — Z79899 Other long term (current) drug therapy: Secondary | ICD-10-CM | POA: Insufficient documentation

## 2016-07-15 DIAGNOSIS — Z853 Personal history of malignant neoplasm of breast: Secondary | ICD-10-CM | POA: Diagnosis not present

## 2016-07-15 DIAGNOSIS — N185 Chronic kidney disease, stage 5: Secondary | ICD-10-CM | POA: Diagnosis not present

## 2016-07-15 DIAGNOSIS — I120 Hypertensive chronic kidney disease with stage 5 chronic kidney disease or end stage renal disease: Secondary | ICD-10-CM | POA: Diagnosis not present

## 2016-07-15 DIAGNOSIS — R197 Diarrhea, unspecified: Secondary | ICD-10-CM | POA: Diagnosis not present

## 2016-07-15 DIAGNOSIS — R112 Nausea with vomiting, unspecified: Secondary | ICD-10-CM | POA: Diagnosis not present

## 2016-07-15 LAB — COMPREHENSIVE METABOLIC PANEL
ALBUMIN: 4.4 g/dL (ref 3.5–5.0)
ALT: 15 U/L (ref 14–54)
AST: 16 U/L (ref 15–41)
Alkaline Phosphatase: 163 U/L — ABNORMAL HIGH (ref 38–126)
Anion gap: 11 (ref 5–15)
BILIRUBIN TOTAL: 0.5 mg/dL (ref 0.3–1.2)
BUN: 16 mg/dL (ref 6–20)
CHLORIDE: 106 mmol/L (ref 101–111)
CO2: 27 mmol/L (ref 22–32)
CREATININE: 0.72 mg/dL (ref 0.44–1.00)
Calcium: 9.3 mg/dL (ref 8.9–10.3)
GFR calc Af Amer: >60 mL/min (ref >60)
GLUCOSE: 105 mg/dL — AB (ref 65–99)
POTASSIUM: 3.7 mmol/L (ref 3.5–5.1)
Sodium: 144 mmol/L (ref 135–145)
Total Protein: 6.7 g/dL (ref 6.5–8.1)

## 2016-07-15 LAB — CBC
HEMATOCRIT: 35.1 % — AB (ref 36.0–46.0)
Hemoglobin: 10.9 g/dL — ABNORMAL LOW (ref 12.0–15.0)
MCH: 26.8 pg (ref 26.0–34.0)
MCHC: 31.1 g/dL (ref 30.0–36.0)
MCV: 86.2 fL (ref 78.0–100.0)
Platelets: 194 10*3/uL (ref 150–400)
RBC: 4.07 MIL/uL (ref 3.87–5.11)
RDW: 18.7 % — AB (ref 11.5–15.5)
WBC: 52.5 10*3/uL — AB (ref 4.0–10.5)

## 2016-07-15 LAB — LIPASE, BLOOD: Lipase: 18 U/L (ref 11–51)

## 2016-07-15 LAB — I-STAT CG4 LACTIC ACID, ED: Lactic Acid, Venous: 1 mmol/L (ref 0.5–1.9)

## 2016-07-15 MED ORDER — SODIUM CHLORIDE 0.9 % IV BOLUS (SEPSIS)
1000.0000 mL | Freq: Once | INTRAVENOUS | Status: AC
  Administered 2016-07-15: 1000 mL via INTRAVENOUS

## 2016-07-15 MED ORDER — ONDANSETRON HCL 4 MG/2ML IJ SOLN
4.0000 mg | Freq: Once | INTRAMUSCULAR | Status: AC
  Administered 2016-07-15: 4 mg via INTRAVENOUS
  Filled 2016-07-15: qty 2

## 2016-07-15 NOTE — ED Provider Notes (Signed)
[place] DEPT Provider Note   CSN: [id number] Arrival date & time: 07/15/16  1833     History   Chief Complaint Chief Complaint  Patient presents with  . Nausea  . Emesis  . Diarrhea    HPI Erika Freeman is a 56 y.o. female who presents with nausea, vomiting, diarrhea. Past medical history significant for breast cancer currently undergoing chemotherapy (Oncologist is Dr. Jana Hakim). She is status post kidney transplant and is on immunosuppressants. She reports chronic nausea, vomiting, diarrhea after she's been on chemotherapy which she does every 2 weeks. Today she woke up and at about 2 PM she has been unable to keep anything down. She takes Compazine at home but did not take any today.  She denies any fevers, chills, chest pain, shortness of breath, abdominal pain, melena or hematochezia, dysuria. They called her oncologist's office today who advised her to come to the ED for further evaluation. No sick contacts.  HPI  Past Medical History:  Diagnosis Date  . Anemia   . Cancer [facility])    breast cancer  . Chills   . Chronic kidney disease    resolved kidney transplant   . Cough   . Fibroid tumor   . GERD (gastroesophageal reflux disease)   . Heart murmur   . Hypertension    DX  30 YRS AGO  . Umbilical hernia s/p primary repair 01/10/2012 01/24/2012  . Vomiting     Patient Active Problem List   Diagnosis Date Noted  . Malignant neoplasm of lower-inner quadrant of left breast in female, estrogen receptor positive ([place]) 05/16/2016  . Umbilical hernia s/p primary repair 01/10/2012 01/24/2012  . Constipation, chronic 01/24/2012  . FSGS (focal segmental glomerulosclerosis) 11/23/2011  . Uterine fibromyoma 11/23/2011  . Chronic kidney disease, stage V (very severe) ([place])   . Hypertension     Past Surgical History:  Procedure Laterality Date  . BREAST SURGERY  2012   left - fibroadenoma  . CAPD INSERTION  01/10/2012   Procedure: LAPAROSCOPIC INSERTION  CONTINUOUS AMBULATORY PERITONEAL DIALYSIS  (CAPD) CATHETER;  Surgeon: Adin Hector, MD;  Location: [place];  Service: General;  Laterality: N/A;  LAPAROSCOPIC CAPD CATHETER PLACEMENT OMENTOPEXY  . CESAREAN SECTION  1996  . DILATION AND CURETTAGE OF UTERUS    . KIDNEY TRANSPLANT    . PARATHYROIDECTOMY  2000 - approximate  . PORTACATH PLACEMENT Right 05/24/2016   Procedure: INSERTION PORT-A-CATH WITH [place];  Surgeon: [name], MD;  Location: [place];  Service: General;  Laterality: Right;  . RENAL BIOPSY  2011  . UMBILICAL HERNIA REPAIR  01/10/2012   Procedure: HERNIA REPAIR UMBILICAL ADULT;  Surgeon: Adin Hector, MD;  Location: [place];  Service: General;  Laterality: N/A;    OB History    No data available       Home Medications    Prior to Admission medications   Medication Sig Start Date End Date Taking? Authorizing Provider  aspirin 81 MG tablet Take 81 mg by mouth daily.    [provider]  cholestyramine (QUESTRAN) 4 g packet Take 1 packet (4 g total) by mouth 3 (three) times daily with meals. 06/08/16   Magrinat, [name], MD  Iron, Ferrous Sulfate, 142 (45 Fe) MG TBCR Take 1 tablet by mouth daily.    [provider]  lidocaine-prilocaine (EMLA) cream Apply to affected area once 05/30/16   Magrinat, [name], MD  loratadine (CLARITIN) 10 MG tablet Take 1 tablet (10 mg total)  by mouth daily. 05/27/16   Magrinat, [name], MD  LORazepam (ATIVAN) 0.5 MG tablet Take 1 tablet (0.5 mg total) by mouth at bedtime. 05/27/16   Magrinat, [name], MD  lovastatin (MEVACOR) 20 MG tablet Take 20 mg by mouth every Monday, Wednesday, and Friday.  05/14/16   [provider]  nystatin cream (MYCOSTATIN) Apply 1 application topically 2 (two) times daily. 07/06/16   Magrinat, [name], MD  potassium chloride (MICRO-K) 10 MEQ CR capsule TAKE ONE CAPSULE BY MOUTH DAILY 06/28/16   Magrinat, [name], MD  predniSONE (DELTASONE) 5 MG tablet Take 5 mg by mouth. 05/24/16   [provider]  prochlorperazine (COMPAZINE) 10 MG tablet TAKE 1 TABLET BY MOUTH EVERY 6 HOURS AS NEEDED FOR NAUSEA OR VOMITING 05/31/16   Magrinat, [name], MD  SENSIPAR 60 MG tablet Take 60 mg by mouth daily.  05/11/16   [provider]  tacrolimus (PROGRAF) 1 MG capsule Take 3 mg by mouth 2 (two) times daily.    [provider]    Family History Family History  Problem Relation Age of Onset  . Cancer Maternal Uncle        lung  . Cancer Cousin        breast  . Cancer Cousin        breast    Social History Social History  Substance Use Topics  . Smoking status: Never Smoker  . Smokeless tobacco: Never Used  . Alcohol use No     Allergies   No known allergies   Review of Systems Review of Systems  Constitutional: Negative for chills and fever.  Respiratory: Negative for shortness of breath.   Cardiovascular: Negative for chest pain.  Gastrointestinal: Positive for diarrhea, nausea and vomiting. Negative for abdominal pain and blood in stool.  Genitourinary: Negative for dysuria.  All other systems reviewed and are negative.    Physical Exam Updated Vital Signs BP (!) 101/59 (BP Location: Left Arm)   Pulse (!) 101   Temp 98.2 F (36.8 C) (Oral)   Resp 18   Ht '5\' 5"'$  (1.651 m)   Wt 144 lb (65.3 kg)   SpO2 97%   BMI 23.96 kg/m   Physical Exam  Constitutional: She is oriented to person, place, and time. She appears well-developed and well-nourished. No distress.  HENT:  Head: Normocephalic and atraumatic.  Eyes: Conjunctivae are normal. Pupils are equal, round, and reactive to light. Right eye exhibits no discharge. Left eye exhibits no discharge. No scleral icterus.  Neck: Normal range of motion.  Cardiovascular: Tachycardia present.  Exam reveals no gallop and no friction rub.   Murmur heard. Pulmonary/Chest: Effort normal and breath sounds normal. No respiratory distress. She has no wheezes. She has no rales. She exhibits no tenderness.    Port in place  Abdominal: Soft. Bowel sounds are normal. She exhibits no distension and no mass. There is no tenderness. There is no rebound and no guarding. No hernia.  Right surgical scar from kidney transplant  Neurological: She is alert and oriented to person, place, and time.  Skin: Skin is warm and dry.  Inflamed cyst over right posterior shoulder  Psychiatric: She has a normal mood and affect. Her behavior is normal.  Nursing note and vitals reviewed.    ED Treatments / Results  Labs (all labs ordered are listed, but only abnormal results are displayed) Labs Reviewed  COMPREHENSIVE METABOLIC PANEL - Abnormal; Notable for the following:  Result Value   Glucose, Bld 105 (*)    Alkaline Phosphatase 163 (*)    All other components within normal limits  CBC - Abnormal; Notable for the following:    WBC 52.5 (*)    Hemoglobin 10.9 (*)    HCT 35.1 (*)    RDW 18.7 (*)    All other components within normal limits  LIPASE, BLOOD  I-STAT CG4 LACTIC ACID, ED  I-STAT CG4 LACTIC ACID, ED    EKG  EKG Interpretation None       Radiology No results found.  Procedures Procedures (including critical care time)  Medications Ordered in ED Medications  sodium chloride 0.9 % bolus 1,000 mL (0 mLs Intravenous Stopped 07/15/16 2305)  ondansetron (ZOFRAN) injection 4 mg (4 mg Intravenous Given 07/15/16 2115)     Initial Impression / Assessment and Plan / ED Course  I have reviewed the triage vital signs and the nursing notes.  Pertinent labs & imaging results that were available during my care of the patient were reviewed by me and considered in my medical decision making (see chart for details).  56 year old female presents with N/V/D. Likely chemo induced. She is mildly tachycardic but otherwise vitals are normal. CBC remarkable for significant leukocytosis of 52.5 compared to 3 days ago which it was 12.3. Shared visit with Dr. Ralene Bathe who spoke with oncologist on call who  recommends follow up with Dr. Jana Hakim. Pt reports feeling better and is ready to leave. She has antiemetics at home. Discussed results with her and advised close follow up and gave strict return precautions.  Final Clinical Impressions(s) / ED Diagnoses   Final diagnoses:  Nausea vomiting and diarrhea    New Prescriptions New Prescriptions   No medications on file     Iris Pert 07/16/16 1016    Quintella Reichert, MD 07/18/16 [id number]

## 2016-07-15 NOTE — Discharge Instructions (Signed)
Please return for worsening symptoms

## 2016-07-15 NOTE — ED Notes (Signed)
Patient drinking water per po challenge.

## 2016-07-15 NOTE — ED Triage Notes (Signed)
Pt from home with c/o n/v/d that began around 1400. Pt states she is unable to hold down water, soup, or crackers.  Pt is not febrile. Pt has history of breast cancer  Pt's last chemo was Tuesday

## 2016-07-15 NOTE — ED Notes (Signed)
Critical WBC of 52.5 Primary RN and EDP notified

## 2016-07-15 NOTE — ED Notes (Signed)
Pt's husband came to nursing station to inform staff pt has medication related to kidney transplant that she needs to take at 2030

## 2016-07-19 ENCOUNTER — Other Ambulatory Visit: Payer: Medicare Other

## 2016-07-19 ENCOUNTER — Encounter: Payer: Self-pay | Admitting: Oncology

## 2016-07-19 NOTE — Progress Notes (Signed)
Received PA request for Ondansetron.  Submitted via Cover My Meds. Erika Freeman (KeyNichola Sizer) [phone]  Need help? Call us at [phone]   Status  Sent to [employer]  Next Steps  The plan will fax you a determination, typically within 1 to 5 business days.

## 2016-07-20 ENCOUNTER — Other Ambulatory Visit (HOSPITAL_BASED_OUTPATIENT_CLINIC_OR_DEPARTMENT_OTHER): Payer: Medicare Other

## 2016-07-20 DIAGNOSIS — C50312 Malignant neoplasm of lower-inner quadrant of left female breast: Secondary | ICD-10-CM

## 2016-07-20 DIAGNOSIS — Z170 Estrogen receptor positive status [ER+]: Principal | ICD-10-CM

## 2016-07-20 LAB — CBC WITH DIFFERENTIAL/PLATELET
BASO%: 1.4 % (ref 0.0–2.0)
Basophils Absolute: 0 10*3/uL (ref 0.0–0.1)
EOS%: 0.3 % (ref 0.0–7.0)
Eosinophils Absolute: 0 10*3/uL (ref 0.0–0.5)
HEMATOCRIT: 33.1 % — AB (ref 34.8–46.6)
HEMOGLOBIN: 10.3 g/dL — AB (ref 11.6–15.9)
LYMPH#: 0.3 10*3/uL — AB (ref 0.9–3.3)
LYMPH%: 19.6 % (ref 14.0–49.7)
MCH: 25.7 pg (ref 25.1–34.0)
MCHC: 31.2 g/dL — ABNORMAL LOW (ref 31.5–36.0)
MCV: 82.6 fL (ref 79.5–101.0)
MONO#: 0.2 10*3/uL (ref 0.1–0.9)
MONO%: 14.9 % — ABNORMAL HIGH (ref 0.0–14.0)
NEUT%: 63.8 % (ref 38.4–76.8)
NEUTROS ABS: 1.1 10*3/uL — AB (ref 1.5–6.5)
PLATELETS: 138 10*3/uL — AB (ref 145–400)
RBC: 4.01 10*6/uL (ref 3.70–5.45)
RDW: 18.7 % — AB (ref 11.2–14.5)
WBC: 1.7 10*3/uL — AB (ref 3.9–10.3)

## 2016-07-20 LAB — COMPREHENSIVE METABOLIC PANEL
ALT: 17 U/L (ref 0–55)
ANION GAP: 9 meq/L (ref 3–11)
AST: 13 U/L (ref 5–34)
Albumin: 4.1 g/dL (ref 3.5–5.0)
Alkaline Phosphatase: 129 U/L (ref 40–150)
BILIRUBIN TOTAL: 0.25 mg/dL (ref 0.20–1.20)
BUN: 8.5 mg/dL (ref 7.0–26.0)
CALCIUM: 9.9 mg/dL (ref 8.4–10.4)
CHLORIDE: 108 meq/L (ref 98–109)
CO2: 23 mEq/L (ref 22–29)
CREATININE: 0.8 mg/dL (ref 0.6–1.1)
EGFR: >90 mL/min/{1.73_m2} (ref >90)
Glucose: 105 mg/dl (ref 70–140)
Potassium: 3.5 mEq/L (ref 3.5–5.1)
Sodium: 141 mEq/L (ref 136–145)
TOTAL PROTEIN: 6.6 g/dL (ref 6.4–8.3)

## 2016-07-21 ENCOUNTER — Encounter (HOSPITAL_COMMUNITY): Payer: Self-pay | Admitting: General Surgery

## 2016-07-26 ENCOUNTER — Ambulatory Visit (HOSPITAL_BASED_OUTPATIENT_CLINIC_OR_DEPARTMENT_OTHER): Payer: Medicare Other

## 2016-07-26 ENCOUNTER — Encounter: Payer: Self-pay | Admitting: *Deleted

## 2016-07-26 ENCOUNTER — Other Ambulatory Visit: Payer: Self-pay | Admitting: Oncology

## 2016-07-26 ENCOUNTER — Other Ambulatory Visit (HOSPITAL_BASED_OUTPATIENT_CLINIC_OR_DEPARTMENT_OTHER): Payer: Medicare Other

## 2016-07-26 VITALS — BP 132/93 | HR 87 | Temp 98.2°F | Resp 18

## 2016-07-26 DIAGNOSIS — Z5111 Encounter for antineoplastic chemotherapy: Secondary | ICD-10-CM | POA: Diagnosis not present

## 2016-07-26 DIAGNOSIS — C50312 Malignant neoplasm of lower-inner quadrant of left female breast: Secondary | ICD-10-CM

## 2016-07-26 DIAGNOSIS — N185 Chronic kidney disease, stage 5: Secondary | ICD-10-CM

## 2016-07-26 DIAGNOSIS — Z170 Estrogen receptor positive status [ER+]: Principal | ICD-10-CM

## 2016-07-26 DIAGNOSIS — N051 Unspecified nephritic syndrome with focal and segmental glomerular lesions: Secondary | ICD-10-CM

## 2016-07-26 LAB — COMPREHENSIVE METABOLIC PANEL
ALBUMIN: 4.2 g/dL (ref 3.5–5.0)
ALK PHOS: 142 U/L (ref 40–150)
ALT: 17 U/L (ref 0–55)
AST: 20 U/L (ref 5–34)
Anion Gap: 12 mEq/L — ABNORMAL HIGH (ref 3–11)
BILIRUBIN TOTAL: 0.27 mg/dL (ref 0.20–1.20)
BUN: 10.7 mg/dL (ref 7.0–26.0)
CALCIUM: 10.1 mg/dL (ref 8.4–10.4)
CO2: 24 mEq/L (ref 22–29)
Chloride: 109 mEq/L (ref 98–109)
Creatinine: 0.8 mg/dL (ref 0.6–1.1)
EGFR: >90 mL/min/{1.73_m2} (ref >90)
GLUCOSE: 100 mg/dL (ref 70–140)
Potassium: 3.2 mEq/L — ABNORMAL LOW (ref 3.5–5.1)
SODIUM: 145 meq/L (ref 136–145)
TOTAL PROTEIN: 6.9 g/dL (ref 6.4–8.3)

## 2016-07-26 LAB — CBC WITH DIFFERENTIAL/PLATELET
BASO%: 0.5 % (ref 0.0–2.0)
BASOS ABS: 0 10*3/uL (ref 0.0–0.1)
EOS ABS: 0 10*3/uL (ref 0.0–0.5)
EOS%: 0.1 % (ref 0.0–7.0)
HEMATOCRIT: 36.4 % (ref 34.8–46.6)
HEMOGLOBIN: 11.7 g/dL (ref 11.6–15.9)
LYMPH#: 0.6 10*3/uL — AB (ref 0.9–3.3)
LYMPH%: 6.9 % — ABNORMAL LOW (ref 14.0–49.7)
MCH: 27.3 pg (ref 25.1–34.0)
MCHC: 32.3 g/dL (ref 31.5–36.0)
MCV: 84.6 fL (ref 79.5–101.0)
MONO#: 0.6 10*3/uL (ref 0.1–0.9)
MONO%: 6.2 % (ref 0.0–14.0)
NEUT%: 86.3 % — ABNORMAL HIGH (ref 38.4–76.8)
NEUTROS ABS: 7.9 10*3/uL — AB (ref 1.5–6.5)
Platelets: 159 10*3/uL (ref 145–400)
RBC: 4.3 10*6/uL (ref 3.70–5.45)
RDW: 20.9 % — ABNORMAL HIGH (ref 11.2–14.5)
WBC: 9.2 10*3/uL (ref 3.9–10.3)

## 2016-07-26 MED ORDER — PACLITAXEL CHEMO INJECTION 300 MG/50ML
80.0000 mg/m2 | Freq: Once | INTRAVENOUS | Status: AC
  Administered 2016-07-26: 144 mg via INTRAVENOUS
  Filled 2016-07-26: qty 24

## 2016-07-26 MED ORDER — DIPHENHYDRAMINE HCL 50 MG/ML IJ SOLN
25.0000 mg | Freq: Once | INTRAMUSCULAR | Status: AC
  Administered 2016-07-26: 25 mg via INTRAVENOUS

## 2016-07-26 MED ORDER — SODIUM CHLORIDE 0.9% FLUSH
10.0000 mL | INTRAVENOUS | Status: DC | PRN
  Administered 2016-07-26: 10 mL
  Filled 2016-07-26: qty 10

## 2016-07-26 MED ORDER — HEPARIN SOD (PORK) LOCK FLUSH 100 UNIT/ML IV SOLN
500.0000 [IU] | Freq: Once | INTRAVENOUS | Status: AC | PRN
Start: 2016-07-26 — End: 2016-07-26
  Administered 2016-07-26: 500 [IU]
  Filled 2016-07-26: qty 5

## 2016-07-26 MED ORDER — SODIUM CHLORIDE 0.9 % IV SOLN
20.0000 mg | Freq: Once | INTRAVENOUS | Status: AC
  Administered 2016-07-26: 20 mg via INTRAVENOUS
  Filled 2016-07-26: qty 2

## 2016-07-26 MED ORDER — DIPHENHYDRAMINE HCL 50 MG/ML IJ SOLN
INTRAMUSCULAR | Status: AC
Start: 2016-07-26 — End: 2016-07-26
  Filled 2016-07-26: qty 1

## 2016-07-26 MED ORDER — FAMOTIDINE IN NACL 20-0.9 MG/50ML-% IV SOLN
20.0000 mg | Freq: Once | INTRAVENOUS | Status: AC
  Administered 2016-07-26: 20 mg via INTRAVENOUS

## 2016-07-26 MED ORDER — SODIUM CHLORIDE 0.9 % IV SOLN
Freq: Once | INTRAVENOUS | Status: AC
  Administered 2016-07-26: 10:00:00 via INTRAVENOUS

## 2016-07-26 MED ORDER — FAMOTIDINE IN NACL 20-0.9 MG/50ML-% IV SOLN
INTRAVENOUS | Status: AC
  Filled 2016-07-26: qty 50

## 2016-07-26 NOTE — Patient Instructions (Signed)
[facility] Discharge Instructions for Patients Receiving Chemotherapy  Today you received the following chemotherapy agents: Taxol   To help prevent nausea and vomiting after your treatment, we encourage you to take your nausea medication as directed.    If you develop nausea and vomiting that is not controlled by your nausea medication, call the clinic.   BELOW ARE SYMPTOMS THAT SHOULD BE REPORTED IMMEDIATELY:  *FEVER GREATER THAN 100.5 F  *CHILLS WITH OR WITHOUT FEVER  NAUSEA AND VOMITING THAT IS NOT CONTROLLED WITH YOUR NAUSEA MEDICATION  *UNUSUAL SHORTNESS OF BREATH  *UNUSUAL BRUISING OR BLEEDING  TENDERNESS IN MOUTH AND THROAT WITH OR WITHOUT PRESENCE OF ULCERS  *URINARY PROBLEMS  *BOWEL PROBLEMS  UNUSUAL RASH Items with * indicate a potential emergency and should be followed up as soon as possible.  Feel free to call the clinic you have any questions or concerns. The clinic phone number is [phone].  Please show the [place] at check-in to the Emergency Department and triage nurse.  Paclitaxel injection What is this medicine? PACLITAXEL (PAK li TAX el) is a chemotherapy drug. It targets fast dividing cells, like cancer cells, and causes these cells to die. This medicine is used to treat ovarian cancer, breast cancer, and other cancers. This medicine may be used for other purposes; ask your health care provider or pharmacist if you have questions. COMMON BRAND NAME(S): Onxol, Taxol What should I tell my health care provider before I take this medicine? They need to know if you have any of these conditions: -blood disorders -irregular heartbeat -infection (especially a virus infection such as chickenpox, cold sores, or herpes) -liver disease -previous or ongoing radiation therapy -an unusual or allergic reaction to paclitaxel, alcohol, polyoxyethylated castor oil, other chemotherapy agents, other medicines, foods, dyes, or  preservatives -pregnant or trying to get pregnant -breast-feeding How should I use this medicine? This drug is given as an infusion into a vein. It is administered in a hospital or clinic by a specially trained health care professional. Talk to your pediatrician regarding the use of this medicine in children. Special care may be needed. Overdosage: If you think you have taken too much of this medicine contact a poison control center or emergency room at once. NOTE: This medicine is only for you. Do not share this medicine with others. What if I miss a dose? It is important not to miss your dose. Call your doctor or health care professional if you are unable to keep an appointment. What may interact with this medicine? Do not take this medicine with any of the following medications: -disulfiram -metronidazole This medicine may also interact with the following medications: -cyclosporine -diazepam -ketoconazole -medicines to increase blood counts like filgrastim, pegfilgrastim, sargramostim -other chemotherapy drugs like cisplatin, doxorubicin, epirubicin, etoposide, teniposide, vincristine -quinidine -testosterone -vaccines -verapamil Talk to your doctor or health care professional before taking any of these medicines: -acetaminophen -aspirin -ibuprofen -ketoprofen -naproxen This list may not describe all possible interactions. Give your health care provider a list of all the medicines, herbs, non-prescription drugs, or dietary supplements you use. Also tell them if you smoke, drink alcohol, or use illegal drugs. Some items may interact with your medicine. What should I watch for while using this medicine? Your condition will be monitored carefully while you are receiving this medicine. You will need important blood work done while you are taking this medicine. This medicine can cause serious allergic reactions. To reduce your risk you will need to  take other medicine(s) before  treatment with this medicine. If you experience allergic reactions like skin rash, itching or hives, swelling of the face, lips, or tongue, tell your doctor or health care professional right away. In some cases, you may be given additional medicines to help with side effects. Follow all directions for their use. This drug may make you feel generally unwell. This is not uncommon, as chemotherapy can affect healthy cells as well as cancer cells. Report any side effects. Continue your course of treatment even though you feel ill unless your doctor tells you to stop. Call your doctor or health care professional for advice if you get a fever, chills or sore throat, or other symptoms of a cold or flu. Do not treat yourself. This drug decreases your body's ability to fight infections. Try to avoid being around people who are sick. This medicine may increase your risk to bruise or bleed. Call your doctor or health care professional if you notice any unusual bleeding. Be careful brushing and flossing your teeth or using a toothpick because you may get an infection or bleed more easily. If you have any dental work done, tell your dentist you are receiving this medicine. Avoid taking products that contain aspirin, acetaminophen, ibuprofen, naproxen, or ketoprofen unless instructed by your doctor. These medicines may hide a fever. Do not become pregnant while taking this medicine. Women should inform their doctor if they wish to become pregnant or think they might be pregnant. There is a potential for serious side effects to an unborn child. Talk to your health care professional or pharmacist for more information. Do not breast-feed an infant while taking this medicine. Men are advised not to father a child while receiving this medicine. This product may contain alcohol. Ask your pharmacist or healthcare provider if this medicine contains alcohol. Be sure to tell all healthcare providers you are taking this medicine.  Certain medicines, like metronidazole and disulfiram, can cause an unpleasant reaction when taken with alcohol. The reaction includes flushing, headache, nausea, vomiting, sweating, and increased thirst. The reaction can last from 30 minutes to several hours. What side effects may I notice from receiving this medicine? Side effects that you should report to your doctor or health care professional as soon as possible: -allergic reactions like skin rash, itching or hives, swelling of the face, lips, or tongue -low blood counts - This drug may decrease the number of white blood cells, red blood cells and platelets. You may be at increased risk for infections and bleeding. -signs of infection - fever or chills, cough, sore throat, pain or difficulty passing urine -signs of decreased platelets or bleeding - bruising, pinpoint red spots on the skin, black, tarry stools, nosebleeds -signs of decreased red blood cells - unusually weak or tired, fainting spells, lightheadedness -breathing problems -chest pain -high or low blood pressure -mouth sores -nausea and vomiting -pain, swelling, redness or irritation at the injection site -pain, tingling, numbness in the hands or feet -slow or irregular heartbeat -swelling of the ankle, feet, hands Side effects that usually do not require medical attention (report to your doctor or health care professional if they continue or are bothersome): -bone pain -complete hair loss including hair on your head, underarms, pubic hair, eyebrows, and eyelashes -changes in the color of fingernails -diarrhea -loosening of the fingernails -loss of appetite -muscle or joint pain -red flush to skin -sweating This list may not describe all possible side effects. Call your doctor for medical advice about  side effects. You may report side effects to FDA at 1-800-FDA-1088. Where should I keep my medicine? This drug is given in a hospital or clinic and will not be stored at  home. NOTE: This sheet is a summary. It may not cover all possible information. If you have questions about this medicine, talk to your doctor, pharmacist, or health care provider.  2018 Elsevier/Gold Standard (2014-12-16 19:58:00)

## 2016-07-27 ENCOUNTER — Telehealth: Payer: Self-pay | Admitting: Oncology

## 2016-07-27 NOTE — Telephone Encounter (Signed)
Confirmed time changwe for 6/5 appts [per sch msg

## 2016-08-02 ENCOUNTER — Ambulatory Visit (HOSPITAL_BASED_OUTPATIENT_CLINIC_OR_DEPARTMENT_OTHER): Payer: Medicare Other

## 2016-08-02 ENCOUNTER — Ambulatory Visit (HOSPITAL_BASED_OUTPATIENT_CLINIC_OR_DEPARTMENT_OTHER): Payer: Medicare Other | Admitting: Oncology

## 2016-08-02 ENCOUNTER — Other Ambulatory Visit (HOSPITAL_BASED_OUTPATIENT_CLINIC_OR_DEPARTMENT_OTHER): Payer: Medicare Other

## 2016-08-02 VITALS — BP 131/91 | HR 90 | Temp 98.4°F | Resp 18

## 2016-08-02 DIAGNOSIS — C50312 Malignant neoplasm of lower-inner quadrant of left female breast: Secondary | ICD-10-CM

## 2016-08-02 DIAGNOSIS — Z5111 Encounter for antineoplastic chemotherapy: Secondary | ICD-10-CM | POA: Diagnosis not present

## 2016-08-02 DIAGNOSIS — Z170 Estrogen receptor positive status [ER+]: Secondary | ICD-10-CM

## 2016-08-02 DIAGNOSIS — N269 Renal sclerosis, unspecified: Secondary | ICD-10-CM

## 2016-08-02 DIAGNOSIS — N186 End stage renal disease: Secondary | ICD-10-CM

## 2016-08-02 DIAGNOSIS — N185 Chronic kidney disease, stage 5: Secondary | ICD-10-CM

## 2016-08-02 DIAGNOSIS — N051 Unspecified nephritic syndrome with focal and segmental glomerular lesions: Secondary | ICD-10-CM

## 2016-08-02 LAB — COMPREHENSIVE METABOLIC PANEL
ALBUMIN: 3.7 g/dL (ref 3.5–5.0)
ALK PHOS: 93 U/L (ref 40–150)
ALT: 18 U/L (ref 0–55)
AST: 16 U/L (ref 5–34)
Anion Gap: 9 mEq/L (ref 3–11)
BUN: 10.5 mg/dL (ref 7.0–26.0)
CALCIUM: 9.5 mg/dL (ref 8.4–10.4)
CO2: 25 mEq/L (ref 22–29)
CREATININE: 0.8 mg/dL (ref 0.6–1.1)
Chloride: 107 mEq/L (ref 98–109)
EGFR: >90 mL/min/{1.73_m2} (ref >90)
Glucose: 111 mg/dl (ref 70–140)
Potassium: 3.2 mEq/L — ABNORMAL LOW (ref 3.5–5.1)
Sodium: 141 mEq/L (ref 136–145)
TOTAL PROTEIN: 6.2 g/dL — AB (ref 6.4–8.3)
Total Bilirubin: 0.38 mg/dL (ref 0.20–1.20)

## 2016-08-02 LAB — CBC WITH DIFFERENTIAL/PLATELET
BASO%: 1 % (ref 0.0–2.0)
BASOS ABS: 0 10*3/uL (ref 0.0–0.1)
EOS%: 0.2 % (ref 0.0–7.0)
Eosinophils Absolute: 0 10*3/uL (ref 0.0–0.5)
HEMATOCRIT: 33.4 % — AB (ref 34.8–46.6)
HEMOGLOBIN: 10.8 g/dL — AB (ref 11.6–15.9)
LYMPH#: 0.4 10*3/uL — AB (ref 0.9–3.3)
LYMPH%: 8.9 % — ABNORMAL LOW (ref 14.0–49.7)
MCH: 27.7 pg (ref 25.1–34.0)
MCHC: 32.3 g/dL (ref 31.5–36.0)
MCV: 85.9 fL (ref 79.5–101.0)
MONO#: 0.4 10*3/uL (ref 0.1–0.9)
MONO%: 8.8 % (ref 0.0–14.0)
NEUT%: 81.1 % — ABNORMAL HIGH (ref 38.4–76.8)
NEUTROS ABS: 3.7 10*3/uL (ref 1.5–6.5)
Platelets: 247 10*3/uL (ref 145–400)
RBC: 3.89 10*6/uL (ref 3.70–5.45)
RDW: 21.9 % — AB (ref 11.2–14.5)
WBC: 4.5 10*3/uL (ref 3.9–10.3)

## 2016-08-02 MED ORDER — DIPHENHYDRAMINE HCL 50 MG/ML IJ SOLN
25.0000 mg | Freq: Once | INTRAMUSCULAR | Status: AC
  Administered 2016-08-02: 25 mg via INTRAVENOUS

## 2016-08-02 MED ORDER — FAMOTIDINE IN NACL 20-0.9 MG/50ML-% IV SOLN
20.0000 mg | Freq: Once | INTRAVENOUS | Status: AC
  Administered 2016-08-02: 20 mg via INTRAVENOUS

## 2016-08-02 MED ORDER — SODIUM CHLORIDE 0.9% FLUSH
10.0000 mL | INTRAVENOUS | Status: DC | PRN
  Administered 2016-08-02: 10 mL
  Filled 2016-08-02: qty 10

## 2016-08-02 MED ORDER — DIPHENHYDRAMINE HCL 50 MG/ML IJ SOLN
INTRAMUSCULAR | Status: AC
  Filled 2016-08-02: qty 1

## 2016-08-02 MED ORDER — PACLITAXEL CHEMO INJECTION 300 MG/50ML
80.0000 mg/m2 | Freq: Once | INTRAVENOUS | Status: AC
  Administered 2016-08-02: 144 mg via INTRAVENOUS
  Filled 2016-08-02: qty 24

## 2016-08-02 MED ORDER — ONDANSETRON HCL 4 MG/2ML IJ SOLN
INTRAMUSCULAR | Status: AC
  Filled 2016-08-02: qty 4

## 2016-08-02 MED ORDER — ONDANSETRON HCL 4 MG/2ML IJ SOLN
8.0000 mg | Freq: Once | INTRAMUSCULAR | Status: AC
Start: 2016-08-02 — End: 2016-08-02
  Administered 2016-08-02: 8 mg via INTRAVENOUS

## 2016-08-02 MED ORDER — SODIUM CHLORIDE 0.9 % IV SOLN
20.0000 mg | Freq: Once | INTRAVENOUS | Status: AC
  Administered 2016-08-02: 20 mg via INTRAVENOUS
  Filled 2016-08-02: qty 2

## 2016-08-02 MED ORDER — SODIUM CHLORIDE 0.9 % IV SOLN: Freq: Once | INTRAVENOUS | Status: DC

## 2016-08-02 MED ORDER — HEPARIN SOD (PORK) LOCK FLUSH 100 UNIT/ML IV SOLN
500.0000 [IU] | Freq: Once | INTRAVENOUS | Status: AC | PRN
  Administered 2016-08-02: 500 [IU]
  Filled 2016-08-02: qty 5

## 2016-08-02 MED ORDER — FAMOTIDINE IN NACL 20-0.9 MG/50ML-% IV SOLN
INTRAVENOUS | Status: AC
  Filled 2016-08-02: qty 50

## 2016-08-02 MED ORDER — SODIUM CHLORIDE 0.9 % IV SOLN
Freq: Once | INTRAVENOUS | Status: AC
  Administered 2016-08-02: 10:00:00 via INTRAVENOUS

## 2016-08-02 NOTE — Progress Notes (Signed)
Patient had episode of vomiting while getting pre-medications. Dr. Doris Cheadle notified and per Dr. Jana Hakim patient to receive 8mg  IV Zofran.

## 2016-08-02 NOTE — Addendum Note (Signed)
Addended by: [name] on: 08/02/2016 02:33 PM   Modules accepted: Orders

## 2016-08-02 NOTE — Progress Notes (Signed)
Erika Freeman  [phone] [phone]) [phone]     ID: [name] DOB: 12/12/1960  MR#: [id number]  DXA#:[id number]  Patient Care Team: [name], MD as PCP - General (Obstetrics and Gynecology) [name], MD as Consulting Physician (Nephrology) [name], MD as Consulting Physician (Obstetrics and Gynecology) [name], MD as Consulting Physician (General Surgery) Tailynn Armetta, [name], MD as Consulting Physician (Oncology) [name], MD as Consulting Physician (Radiation Oncology) [name], MD as Referring Physician (Surgery) [name], DO (Internal Medicine) [name], MD OTHER MD:  CHIEF COMPLAINT: Estrogen receptor positive breast cancer  CURRENT TREATMENT: Neoadjuvant chemotherapy  INTERVAL HISTORY: Erika Freeman returns today for follow-up of her estrogen receptor positive breast cancer accompanied by her husband Erika Freeman. Today is day 1 cycle 2 of 12 weekly doses of paclitaxel.  She did remarkably well with her first dose, with no nausea, vomiting, weakness, or immune reactions. She has had no numbness or tingling in her finger pads her toe pads. She has normal functional status for her at this point  REVIEW OF SYSTEMS: A detailed review of systems today was otherwise stable  BREAST CANCER HISTORY: From the original intake note:  "Erika Freeman" herself noted a mass in her left breast. She ignored it because she thought it was related to menstruation. However as it persisted through 1. She brought it to Dr. [name] attention and on 05/10/2016 she underwent left diagnostic mammography with tomography and [facility] The breast density was category C. In the left breast lower central area there was a new circumscribed mass. This was firm and fixed in the lower inner left breast at middle depth. Ultrasonography confirmed a 2.6 cm hypoechoic irregular mass at the 6:30 o'clock  radiant 3 cm from the nipple. There was also a 0.5 cm nodule 1.4 cm medial to the mass just described.  Freeman of the left breast mass in question 05/10/2016 showed([id number]) invasive ductal carcinoma, grade 3, estrogen receptor 70% positive with weak staining intensity, progesterone receptor negative, with no HER-2 amplification, the signals ratio being 1.52 and the number per cell 2.35. The proliferation marker was 90%.  Her subsequent history is as detailed below  OF NOTE: The patient has a history of focal segmental glomerular sclerosis with end-stage renal disease and is status post unrelated donor renal transplant 04/15/2013 as part of appeared donor exchange with her husband donating one kidney, the other harvested from a 55 year old [place] man. She continues on immunosuppression with mycophenolate and tacrolimus.  PAST MEDICAL HISTORY: Past Medical History:  Diagnosis Date  . Anemia   . Cancer [facility])    breast cancer  . Chills   . Chronic kidney disease    resolved kidney transplant   . Cough   . Fibroid tumor   . GERD (gastroesophageal reflux disease)   . Heart murmur   . Hypertension    DX  30 YRS AGO  . Umbilical hernia s/p primary repair 01/10/2012 01/24/2012  . Vomiting     PAST SURGICAL HISTORY: Past Surgical History:  Procedure Laterality Date  . BREAST SURGERY  2012   left - fibroadenoma  . CAPD INSERTION  01/10/2012   Procedure: LAPAROSCOPIC INSERTION CONTINUOUS AMBULATORY PERITONEAL DIALYSIS  (CAPD) CATHETER;  Surgeon: Adin Hector, MD;  Location: [place];  Service: General;  Laterality: N/A;  LAPAROSCOPIC CAPD CATHETER PLACEMENT OMENTOPEXY  . CESAREAN SECTION  1996  . DILATION AND CURETTAGE OF UTERUS    .  KIDNEY TRANSPLANT    . PARATHYROIDECTOMY  2000 - approximate  . PORTACATH PLACEMENT Right 05/24/2016   Procedure: INSERTION PORT-A-CATH WITH [place];  Surgeon: [name], MD;  Location: [place];  Service: General;  Laterality: Right;  . RENAL Freeman  2011   . UMBILICAL HERNIA REPAIR  01/10/2012   Procedure: HERNIA REPAIR UMBILICAL ADULT;  Surgeon: Adin Hector, MD;  Location: [place];  Service: General;  Laterality: N/A;    FAMILY HISTORY Family History  Problem Relation Age of Onset  . Cancer Maternal Uncle        lung  . Cancer Cousin        breast  . Cancer Cousin        breast  The patient has very little information regarding her father. Her mother is living, 4 years old as of March 2018. The patient had one full brother, diagnosed with prostate cancer in his 70s. The patient has 2 half-brothers and 1 half-sister. There is no history of breast or ovarian cancer in the family to the patient's knowledge   GYNECOLOGIC HISTORY:  No LMP recorded.  Menarche age 51, first live birth age 50, the patient is GX P3. Her periods are regular, the last 5 or 6 days, with no heavy days. 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 Her husband white works at home [employer]. Son Erika Freeman lives in [place] and as Freight forwarder of a Therapist, art. Son Erika Freeman lives in [place] and is a Air traffic controller. Son Erika Freeman lives in [place] and is studying based trombone. The patient has 2 grandchildren. She attends a local [employer]:  not in place    HEALTH MAINTENANCE: Social History  Substance Use Topics  . Smoking status: Never Smoker  . Smokeless tobacco: Never Used  . Alcohol use No     Colonoscopy:  PAP:  Bone density:   Allergies  Allergen Reactions  . No Known Allergies     Current Outpatient Prescriptions  Medication Sig Dispense Refill  . aspirin 81 MG tablet Take 81 mg by mouth every morning.     . cholestyramine (QUESTRAN) 4 g packet Take 1 packet (4 g total) by mouth 3 (three) times daily with meals. (Patient taking differently: Take 4 g by mouth 4 (four) times daily as needed. )  60 each 12  . diphenhydrAMINE (BENADRYL) 25 MG tablet Take 25 mg by mouth daily as needed for allergies.    . fluconazole (DIFLUCAN) 100 MG tablet Take 100 mg by mouth daily.  0  . Iron, Ferrous Sulfate, 142 (45 Fe) MG TBCR Take 1 tablet by mouth every morning.     . lidocaine-prilocaine (EMLA) cream Apply to affected area once 30 g 3  . loratadine (CLARITIN) 10 MG tablet Take 1 tablet (10 mg total) by mouth daily. 100 tablet 0  . LORazepam (ATIVAN) 0.5 MG tablet Take 1 tablet (0.5 mg total) by mouth at bedtime. (Patient taking differently: Take 0.5 mg by mouth at bedtime as needed for sleep. ) 30 tablet 0  . lovastatin (MEVACOR) 20 MG tablet Take 20 mg by mouth every Monday, Wednesday, and Friday.     . nystatin cream (MYCOSTATIN) Apply 1 application topically 2 (two) times daily. 30 g 0  . potassium chloride (MICRO-K) 10 MEQ CR capsule TAKE ONE CAPSULE BY MOUTH DAILY (Patient taking differently: TAKE  10 MEQ BY MOUTH DAILY) 90 capsule 3  . predniSONE (DELTASONE) 5 MG tablet Take 5 mg by mouth every morning.     . prochlorperazine (COMPAZINE) 10 MG tablet TAKE 1 TABLET BY MOUTH EVERY 6 HOURS AS NEEDED FOR NAUSEA OR VOMITING (Patient taking differently: TAKE 10 MG BY MOUTH EVERY 6 HOURS AS NEEDED FOR NAUSEA OR VOMITING) 385 tablet 1  . SENSIPAR 60 MG tablet Take 60 mg by mouth 3 (three) times a week.     . tacrolimus (PROGRAF) 1 MG capsule Take 3 mg by mouth 2 (two) times daily.     No current facility-administered medications for this visit.     OBJECTIVE: middle-aged African-American womanExamined in the treatment area  There were no vitals filed for this visit.   There is no height or weight on file to calculate BMI.     ECOG FS:1 - Symptomatic but completely ambulatory  Sclerae unicteric, EOMs intact Oropharynx clear and moist No cervical or supraclavicular adenopathy Lungs no rales or rhonchi Heart regular rate and rhythm Abd soft, nontender, positive bowel sounds MSK no focal spinal  tenderness, no upper extremity lymphedema Neuro: nonfocal, well oriented, appropriate affect Breasts: Deferred   LAB RESULTS:  CMP     Component Value Date/Time   NA 141 08/02/2016 0859   K 3.2 (L) 08/02/2016 0859   CL 106 07/15/2016 2008   CO2 25 08/02/2016 0859   GLUCOSE 111 08/02/2016 0859   BUN 10.5 08/02/2016 0859   CREATININE 0.8 08/02/2016 0859   CALCIUM 9.5 08/02/2016 0859   PROT 6.2 (L) 08/02/2016 0859   ALBUMIN 3.7 08/02/2016 0859   AST 16 08/02/2016 0859   ALT 18 08/02/2016 0859   ALKPHOS 93 08/02/2016 0859   BILITOT 0.38 08/02/2016 0859   GFRNONAA >60 07/15/2016 2008   GFRAA >60 07/15/2016 2008    No results found for: TOTALPROTELP, ALBUMINELP, A1GS, A2GS, BETS, BETA2SER, GAMS, MSPIKE, SPEI  No results found for: Nils Pyle, [facility]  Lab Results  Component Value Date   WBC 4.5 08/02/2016   NEUTROABS 3.7 08/02/2016   HGB 10.8 (L) 08/02/2016   HCT 33.4 (L) 08/02/2016   MCV 85.9 08/02/2016   PLT 247 08/02/2016      Chemistry      Component Value Date/Time   NA 141 08/02/2016 0859   K 3.2 (L) 08/02/2016 0859   CL 106 07/15/2016 2008   CO2 25 08/02/2016 0859   BUN 10.5 08/02/2016 0859   CREATININE 0.8 08/02/2016 0859      Component Value Date/Time   CALCIUM 9.5 08/02/2016 0859   ALKPHOS 93 08/02/2016 0859   AST 16 08/02/2016 0859   ALT 18 08/02/2016 0859   BILITOT 0.38 08/02/2016 0859       No results found for: LABCA2  No components found for: [id number]  No results for input(s): INR in the last 168 hours.  Urinalysis    Component Value Date/Time   COLORURINE YELLOW 11/24/2006 1539   APPEARANCEUR CLEAR 11/24/2006 1539   LABSPEC 1.018 11/24/2006 1539   PHURINE 6.0 11/24/2006 1539   GLUCOSEU NEGATIVE 11/24/2006 1539   HGBUR SMALL (A) 11/24/2006 1539   BILIRUBINUR NEGATIVE 11/24/2006 1539   KETONESUR NEGATIVE 11/24/2006 1539   PROTEINUR 100 (A) 11/24/2006 1539   UROBILINOGEN 1.0 11/24/2006 1539   NITRITE NEGATIVE  11/24/2006 1539   LEUKOCYTESUR MODERATE (A) 11/24/2006 1539     STUDIES: No results found.  ELIGIBLE FOR AVAILABLE RESEARCH PROTOCOL: no  ASSESSMENT: 56 y.o. Erika Freeman,  Erika Freeman 05/10/2016 for a clinical  T2 N0, prognostic stage IIB invaside ductal carcinoma, grade 3, weekly estrogen receptor positive, progesterone receptor negative, with no HER-2 amplification, and the MIB-1 at 90%  (a) Freeman of the 0.6 mm satellite at the 6:30 o'clock radiant in the left breast 06/02/2016 showed invasive ductal carcinoma, grade 3, estrogen receptor 20% positive, with weak staining intensity, progesterone receptor negative with no HER-2 amplification, and an MIB-1 of 70% (identical to larger mass)   (1) neoadjuvant chemotherapy consisting of doxorubicin and cyclophosphamide in dose dense fashion 4 started 06/01/2016, completed 07/12/2016  followed by Abraxane weekly 12 started 07/26/2016  (2) definitive surgery to follow, with breast conservation preferred if feasible   (a) satellite lesion noted on [place] to be included in final surgery sample  (3) adjuvant radiation as appropriate  (4) anti-estrogens to follow the completion of local treatment  PLAN: Erika Freeman Is tolerating her paclitaxel well and specifically denies any peripheral neuropathy issues.  We discussed what she is able and not able to do. She has not been going out to church or to read or to visit relatives or to movies. I think visiting people in nursing homes and hospitals is not a good idea for her at this point, but she certainly can go to church or 2 a movie or 2 a restaurant. What she needs to do is bring her pure L with her and does sterilize her hands after everybody shakes or hands. She may also want to wear a mass not so much because it protective but because people avoid giving her high axilla she does wear a mask  Otherwise the plan continues to be for her to receive weekly  paclitaxel 12, and I will see her again with her dose next week  She knows to call for any other problems admitted developed before that visit. [name], MD   08/02/2016 9:33 AM Medical Oncology and Hematology [facility] [address] [place], [place] [address] Tel. [phone]    Fax. [phone]

## 2016-08-02 NOTE — Patient Instructions (Signed)
[facility] Discharge Instructions for Patients Receiving Chemotherapy  Today you received the following chemotherapy agents Taxol   To help prevent nausea and vomiting after your treatment, we encourage you to take your nausea medication as directed.   If you develop nausea and vomiting that is not controlled by your nausea medication, call the clinic.   BELOW ARE SYMPTOMS THAT SHOULD BE REPORTED IMMEDIATELY:  *FEVER GREATER THAN 100.5 F  *CHILLS WITH OR WITHOUT FEVER  NAUSEA AND VOMITING THAT IS NOT CONTROLLED WITH YOUR NAUSEA MEDICATION  *UNUSUAL SHORTNESS OF BREATH  *UNUSUAL BRUISING OR BLEEDING  TENDERNESS IN MOUTH AND THROAT WITH OR WITHOUT PRESENCE OF ULCERS  *URINARY PROBLEMS  *BOWEL PROBLEMS  UNUSUAL RASH Items with * indicate a potential emergency and should be followed up as soon as possible.  Feel free to call the clinic you have any questions or concerns. The clinic phone number is [phone].  Please show the CHEMO ALERT CARD at check-in to the Emergency Department and triage nurse.   

## 2016-08-08 NOTE — Progress Notes (Signed)
[place]  [phone]) [phone] Fax:([phone]) [phone]     ID: Erika Freeman DOB: June 10, 1960  MR#: [id number]  BOF#:[id number]  Patient Care Team: Erika Fus, MD as PCP - General (Obstetrics and Gynecology) Erika Emms, MD as Consulting Physician (Nephrology) Erika Fus, MD as Consulting Physician (Obstetrics and Gynecology) Erika Bookbinder, MD as Consulting Physician (General Surgery) Magrinat, Erika Dad, MD as Consulting Physician (Oncology) Erika Pray, MD as Consulting Physician (Radiation Oncology) Erika Minks, MD as Referring Physician (Surgery) Erika Lack, DO (Internal Medicine) Erika Dock, NP OTHER MD:  CHIEF COMPLAINT: Estrogen receptor positive breast cancer  CURRENT TREATMENT: Neoadjuvant chemotherapy  INTERVAL HISTORY: Erika Freeman returns today for follow-up of her estrogen receptor positive breast cancer accompanied by her husband Erika Freeman. Today is day 1 cycle 3 of 12 weekly doses of paclitaxel.  She is doing very well in regards to tolerating the chemotherapy.  She does have some hyperpigmentation to her knuckles and her upper thighs.  She denies peripheral neuropathy.    REVIEW OF SYSTEMS: She is wanting to drop the IV benadryl from her treatment plan due to nausea and vomiting during her last treatment.  A detailed review of systems today was otherwise stable  BREAST CANCER HISTORY: From the original intake note:  "Erika Freeman" herself noted a mass in her left breast. She ignored it because she thought it was related to menstruation. However as it persisted through 1. She brought it to Dr. [name] attention and on 05/10/2016 she underwent left diagnostic mammography with tomography and [facility] The breast density was category C. In the left breast lower central area there was a new circumscribed mass. This was firm and fixed in the lower inner left breast at middle depth.  Ultrasonography confirmed a 2.6 cm hypoechoic irregular mass at the 6:30 o'clock radiant 3 cm from the nipple. There was also a 0.5 cm nodule 1.4 cm medial to the mass just described.  Biopsy of the left breast mass in question 05/10/2016 showed([id number]) invasive ductal carcinoma, grade 3, estrogen receptor 70% positive with weak staining intensity, progesterone receptor negative, with no HER-2 amplification, the signals ratio being 1.52 and the number per cell 2.35. The proliferation marker was 90%.  Her subsequent history is as detailed below  OF NOTE: The patient has a history of focal segmental glomerular sclerosis with end-stage renal disease and is status post unrelated donor renal transplant 04/15/2013 as part of appeared donor exchange with her husband donating one kidney, the other harvested from a 55 year old [place] man. She continues on immunosuppression with mycophenolate and tacrolimus.  PAST MEDICAL HISTORY: Past Medical History:  Diagnosis Date  . Anemia   . Cancer [facility])    breast cancer  . Chills   . Chronic kidney disease    resolved kidney transplant   . Cough   . Fibroid tumor   . GERD (gastroesophageal reflux disease)   . Heart murmur   . Hypertension    DX  30 YRS AGO  . Umbilical hernia s/p primary repair 01/10/2012 01/24/2012  . Vomiting     PAST SURGICAL HISTORY: Past Surgical History:  Procedure Laterality Date  . BREAST SURGERY  2012   left - fibroadenoma  . CAPD INSERTION  01/10/2012   Procedure: LAPAROSCOPIC INSERTION CONTINUOUS AMBULATORY PERITONEAL DIALYSIS  (CAPD) CATHETER;  Surgeon: Adin Hector, MD;  Location: [place];  Service: General;  Laterality: N/A;  LAPAROSCOPIC CAPD CATHETER PLACEMENT OMENTOPEXY  . CESAREAN  SECTION  1996  . DILATION AND CURETTAGE OF UTERUS    . KIDNEY TRANSPLANT    . PARATHYROIDECTOMY  2000 - approximate  . PORTACATH PLACEMENT Right 05/24/2016   Procedure: INSERTION PORT-A-CATH WITH [place];  Surgeon: Erika Bookbinder,  MD;  Location: [place];  Service: General;  Laterality: Right;  . RENAL BIOPSY  2011  . UMBILICAL HERNIA REPAIR  01/10/2012   Procedure: HERNIA REPAIR UMBILICAL ADULT;  Surgeon: Adin Hector, MD;  Location: [place];  Service: General;  Laterality: N/A;    FAMILY HISTORY Family History  Problem Relation Age of Onset  . Cancer Maternal Uncle        lung  . Cancer Cousin        breast  . Cancer Cousin        breast  The patient has very little information regarding her father. Her mother is living, 60 years old as of March 2018. The patient had one full brother, diagnosed with prostate cancer in his 53s. The patient has 2 half-brothers and 1 half-sister. There is no history of breast or ovarian cancer in the family to the patient's knowledge   GYNECOLOGIC HISTORY:  No LMP recorded.  Menarche age 59, first live birth age 44, the patient is GX P3. Her periods are regular, the last 5 or 6 days, with no heavy days. 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 Her husband white works at home [employer]. Son Erika Freeman lives in [place] and as Freight forwarder of a Therapist, art. Son Erika Freeman lives in [place] and is a Air traffic controller. Son Erika Freeman lives in [place] and is studying based trombone. The patient has 2 grandchildren. She attends a local [employer]:  not in place    HEALTH MAINTENANCE: Social History  Substance Use Topics  . Smoking status: Never Smoker  . Smokeless tobacco: Never Used  . Alcohol use No     Colonoscopy:  PAP:  Bone density:   Allergies  Allergen Reactions  . No Known Allergies     Current Outpatient Prescriptions  Medication Sig Dispense Refill  . aspirin 81 MG tablet Take 81 mg by mouth every morning.     . Iron, Ferrous Sulfate, 142 (45 Fe) MG TBCR Take 1 tablet by mouth every morning.     .  lidocaine-prilocaine (EMLA) cream Apply to affected area once 30 g 3  . loratadine (CLARITIN) 10 MG tablet Take 1 tablet (10 mg total) by mouth daily. 100 tablet 0  . lovastatin (MEVACOR) 20 MG tablet Take 20 mg by mouth every Monday, Wednesday, and Friday.     . nystatin cream (MYCOSTATIN) Apply 1 application topically 2 (two) times daily. 30 g 0  . predniSONE (DELTASONE) 5 MG tablet Take 5 mg by mouth every morning.     . prochlorperazine (COMPAZINE) 10 MG tablet TAKE 1 TABLET BY MOUTH EVERY 6 HOURS AS NEEDED FOR NAUSEA OR VOMITING (Patient taking differently: TAKE 10 MG BY MOUTH EVERY 6 HOURS AS NEEDED FOR NAUSEA OR VOMITING) 385 tablet 1  . SENSIPAR 60 MG tablet Take 60 mg by mouth 3 (three) times a week.     . tacrolimus (PROGRAF) 1 MG capsule Take 3 mg by mouth 2 (two) times daily.    . cholestyramine (QUESTRAN) 4 g packet Take 1 packet (4 g total) by mouth 3 (three) times  daily with meals. (Patient taking differently: Take 4 g by mouth 4 (four) times daily as needed. ) 60 each 12  . diphenhydrAMINE (BENADRYL) 25 MG tablet Take 25 mg by mouth daily as needed for allergies.    . fluconazole (DIFLUCAN) 100 MG tablet Take 100 mg by mouth daily.  0  . LORazepam (ATIVAN) 0.5 MG tablet Take 1 tablet (0.5 mg total) by mouth at bedtime. (Patient not taking: Reported on 08/09/2016) 30 tablet 0  . potassium chloride (MICRO-K) 10 MEQ CR capsule TAKE ONE CAPSULE BY MOUTH DAILY (Patient not taking: Reported on 08/09/2016) 90 capsule 3   No current facility-administered medications for this visit.     OBJECTIVE:   Vitals:   08/09/16 0840  BP: 120/84  Pulse: 84  Resp: 18  Temp: 97.9 F (36.6 C)     Body mass index is 24.08 kg/m.     ECOG FS:1 - Symptomatic but completely ambulatory GENERAL: Patient is a well appearing female in no acute distress HEENT:  Sclerae anicteric.  Oropharynx clear and moist. No ulcerations or evidence of oropharyngeal candidiasis. Neck is supple.  NODES:  No cervical,  supraclavicular, or axillary lymphadenopathy palpated.  BREAST EXAM:  Deferred. LUNGS:  Clear to auscultation bilaterally.  No wheezes or rhonchi. HEART:  Regular rate and rhythm. No murmur appreciated. ABDOMEN:  Soft, nontender.  Positive, normoactive bowel sounds. No organomegaly palpated. MSK:  No focal spinal tenderness to palpation. Full range of motion bilaterally in the upper extremities. EXTREMITIES:  No peripheral edema.   SKIN:  Clear with no obvious rashes or skin changes. No nail dyscrasia. NEURO:  Nonfocal. Well oriented.  Appropriate affect.     LAB RESULTS:  CMP     Component Value Date/Time   NA 141 08/02/2016 0859   K 3.2 (L) 08/02/2016 0859   CL 106 07/15/2016 2008   CO2 25 08/02/2016 0859   GLUCOSE 111 08/02/2016 0859   BUN 10.5 08/02/2016 0859   CREATININE 0.8 08/02/2016 0859   CALCIUM 9.5 08/02/2016 0859   PROT 6.2 (L) 08/02/2016 0859   ALBUMIN 3.7 08/02/2016 0859   AST 16 08/02/2016 0859   ALT 18 08/02/2016 0859   ALKPHOS 93 08/02/2016 0859   BILITOT 0.38 08/02/2016 0859   GFRNONAA >60 07/15/2016 2008   GFRAA >60 07/15/2016 2008    No results found for: TOTALPROTELP, ALBUMINELP, A1GS, A2GS, BETS, BETA2SER, GAMS, MSPIKE, SPEI  No results found for: Nils Pyle, [facility]  Lab Results  Component Value Date   WBC 2.1 (L) 08/09/2016   NEUTROABS 1.3 (L) 08/09/2016   HGB 11.1 (L) 08/09/2016   HCT 35.0 08/09/2016   MCV 86.1 08/09/2016   PLT 251 08/09/2016      Chemistry      Component Value Date/Time   NA 141 08/02/2016 0859   K 3.2 (L) 08/02/2016 0859   CL 106 07/15/2016 2008   CO2 25 08/02/2016 0859   BUN 10.5 08/02/2016 0859   CREATININE 0.8 08/02/2016 0859      Component Value Date/Time   CALCIUM 9.5 08/02/2016 0859   ALKPHOS 93 08/02/2016 0859   AST 16 08/02/2016 0859   ALT 18 08/02/2016 0859   BILITOT 0.38 08/02/2016 0859       No results found for: LABCA2  No components found for: SEGBTD176  No results for  input(s): INR in the last 168 hours.  Urinalysis    Component Value Date/Time   COLORURINE YELLOW 11/24/2006 [place] 11/24/2006 1539  LABSPEC 1.018 11/24/2006 1539   PHURINE 6.0 11/24/2006 1539   GLUCOSEU NEGATIVE 11/24/2006 1539   HGBUR SMALL (A) 11/24/2006 1539   BILIRUBINUR NEGATIVE 11/24/2006 1539   KETONESUR NEGATIVE 11/24/2006 1539   PROTEINUR 100 (A) 11/24/2006 1539   UROBILINOGEN 1.0 11/24/2006 1539   NITRITE NEGATIVE 11/24/2006 1539   LEUKOCYTESUR MODERATE (A) 11/24/2006 1539     STUDIES: No results found.  ELIGIBLE FOR AVAILABLE RESEARCH PROTOCOL: no  ASSESSMENT: 56 y.o. [place], [place] woman status post left breast lower inner quadrant biopsy 05/10/2016 for a clinical  T2 N0, prognostic stage IIB invaside ductal carcinoma, grade 3, weekly estrogen receptor positive, progesterone receptor negative, with no HER-2 amplification, and the MIB-1 at 90%  (a) biopsy of the 0.6 mm satellite at the 6:30 o'clock radiant in the left breast 06/02/2016 showed invasive ductal carcinoma, grade 3, estrogen receptor 20% positive, with weak staining intensity, progesterone receptor negative with no HER-2 amplification, and an MIB-1 of 70% (identical to larger mass)   (1) neoadjuvant chemotherapy consisting of doxorubicin and cyclophosphamide in dose dense fashion 4 started 06/01/2016, completed 07/12/2016  followed by Abraxane weekly 12 started 07/26/2016  (2) definitive surgery to follow, with breast conservation preferred if feasible   (a) satellite lesion noted on [place] to be included in final surgery sample  (3) adjuvant radiation as appropriate  (4) anti-estrogens to follow the completion of local treatment  PLAN: Erika Freeman is doing well today.  She is tolerating Paclitaxel well.  I reviewed her labs with her in detail and her [place] is 1300.  I reviewed this with Dr. Jana Hakim and she will proceed with treatment today and will not need any Neupogen injections.  I reviewed  the above with the patient.  She verbalized understanding of the plan and is in agreement with it.  She knows to call for any questions or concerns prior to her next appointment here.    A total of (30) minutes of face-to-face time was spent with this patient with greater than 50% of that time in counseling and care-coordination.   Erika Dock, NP   08/09/2016 8:54 AM Medical Oncology and Hematology [facility] [address] [place], [place] [address] Tel. [phone]    Fax. [phone]

## 2016-08-09 ENCOUNTER — Other Ambulatory Visit (HOSPITAL_BASED_OUTPATIENT_CLINIC_OR_DEPARTMENT_OTHER): Payer: Medicare Other

## 2016-08-09 ENCOUNTER — Ambulatory Visit (HOSPITAL_BASED_OUTPATIENT_CLINIC_OR_DEPARTMENT_OTHER): Payer: Medicare Other | Admitting: Adult Health

## 2016-08-09 ENCOUNTER — Ambulatory Visit (HOSPITAL_BASED_OUTPATIENT_CLINIC_OR_DEPARTMENT_OTHER): Payer: Medicare Other

## 2016-08-09 ENCOUNTER — Encounter: Payer: Self-pay | Admitting: Adult Health

## 2016-08-09 VITALS — BP 120/84 | HR 84 | Temp 97.9°F | Resp 18 | Ht 65.0 in | Wt 144.7 lb

## 2016-08-09 DIAGNOSIS — N269 Renal sclerosis, unspecified: Secondary | ICD-10-CM

## 2016-08-09 DIAGNOSIS — Z170 Estrogen receptor positive status [ER+]: Principal | ICD-10-CM

## 2016-08-09 DIAGNOSIS — C50312 Malignant neoplasm of lower-inner quadrant of left female breast: Secondary | ICD-10-CM | POA: Diagnosis not present

## 2016-08-09 DIAGNOSIS — N186 End stage renal disease: Secondary | ICD-10-CM

## 2016-08-09 DIAGNOSIS — N051 Unspecified nephritic syndrome with focal and segmental glomerular lesions: Secondary | ICD-10-CM

## 2016-08-09 DIAGNOSIS — N185 Chronic kidney disease, stage 5: Secondary | ICD-10-CM

## 2016-08-09 DIAGNOSIS — Z5112 Encounter for antineoplastic immunotherapy: Secondary | ICD-10-CM | POA: Diagnosis not present

## 2016-08-09 LAB — CBC WITH DIFFERENTIAL/PLATELET
BASO%: 1.4 % (ref 0.0–2.0)
Basophils Absolute: 0 10*3/uL (ref 0.0–0.1)
EOS ABS: 0 10*3/uL (ref 0.0–0.5)
EOS%: 1.3 % (ref 0.0–7.0)
HEMATOCRIT: 35 % (ref 34.8–46.6)
HEMOGLOBIN: 11.1 g/dL — AB (ref 11.6–15.9)
LYMPH%: 22 % (ref 14.0–49.7)
MCH: 27.3 pg (ref 25.1–34.0)
MCHC: 31.7 g/dL (ref 31.5–36.0)
MCV: 86.1 fL (ref 79.5–101.0)
MONO#: 0.3 10*3/uL (ref 0.1–0.9)
MONO%: 13.3 % (ref 0.0–14.0)
NEUT%: 62 % (ref 38.4–76.8)
NEUTROS ABS: 1.3 10*3/uL — AB (ref 1.5–6.5)
PLATELETS: 251 10*3/uL (ref 145–400)
RBC: 4.06 10*6/uL (ref 3.70–5.45)
RDW: 21.5 % — AB (ref 11.2–14.5)
WBC: 2.1 10*3/uL — AB (ref 3.9–10.3)
lymph#: 0.5 10*3/uL — ABNORMAL LOW (ref 0.9–3.3)

## 2016-08-09 LAB — COMPREHENSIVE METABOLIC PANEL
ALK PHOS: 79 U/L (ref 40–150)
ALT: 19 U/L (ref 0–55)
ANION GAP: 11 meq/L (ref 3–11)
AST: 19 U/L (ref 5–34)
Albumin: 3.8 g/dL (ref 3.5–5.0)
BILIRUBIN TOTAL: 0.35 mg/dL (ref 0.20–1.20)
BUN: 13 mg/dL (ref 7.0–26.0)
CO2: 25 mEq/L (ref 22–29)
CREATININE: 0.8 mg/dL (ref 0.6–1.1)
Calcium: 9.7 mg/dL (ref 8.4–10.4)
Chloride: 108 mEq/L (ref 98–109)
Glucose: 102 mg/dl (ref 70–140)
Potassium: 3.4 mEq/L — ABNORMAL LOW (ref 3.5–5.1)
Sodium: 145 mEq/L (ref 136–145)
TOTAL PROTEIN: 6.3 g/dL — AB (ref 6.4–8.3)

## 2016-08-09 MED ORDER — FAMOTIDINE IN NACL 20-0.9 MG/50ML-% IV SOLN
20.0000 mg | Freq: Once | INTRAVENOUS | Status: AC
  Administered 2016-08-09: 20 mg via INTRAVENOUS

## 2016-08-09 MED ORDER — PACLITAXEL CHEMO INJECTION 300 MG/50ML
80.0000 mg/m2 | Freq: Once | INTRAVENOUS | Status: AC
  Administered 2016-08-09: 144 mg via INTRAVENOUS
  Filled 2016-08-09: qty 24

## 2016-08-09 MED ORDER — FAMOTIDINE IN NACL 20-0.9 MG/50ML-% IV SOLN
INTRAVENOUS | Status: AC
  Filled 2016-08-09: qty 50

## 2016-08-09 MED ORDER — DEXAMETHASONE SODIUM PHOSPHATE 10 MG/ML IJ SOLN
INTRAMUSCULAR | Status: AC
Start: 2016-08-09 — End: 2016-08-09
  Filled 2016-08-09: qty 1

## 2016-08-09 MED ORDER — DIPHENHYDRAMINE HCL 25 MG PO CAPS
25.0000 mg | ORAL_CAPSULE | Freq: Once | ORAL | Status: AC
  Administered 2016-08-09: 25 mg via ORAL

## 2016-08-09 MED ORDER — DIPHENHYDRAMINE HCL 25 MG PO CAPS
ORAL_CAPSULE | ORAL | Status: AC
  Filled 2016-08-09: qty 1

## 2016-08-09 MED ORDER — SODIUM CHLORIDE 0.9 % IV SOLN
Freq: Once | INTRAVENOUS | Status: AC
  Administered 2016-08-09: 10:00:00 via INTRAVENOUS

## 2016-08-09 MED ORDER — DEXAMETHASONE SODIUM PHOSPHATE 10 MG/ML IJ SOLN
4.0000 mg | Freq: Once | INTRAMUSCULAR | Status: AC
Start: 2016-08-09 — End: 2016-08-09
  Administered 2016-08-09: 4 mg via INTRAVENOUS

## 2016-08-09 MED ORDER — HEPARIN SOD (PORK) LOCK FLUSH 100 UNIT/ML IV SOLN
500.0000 [IU] | Freq: Once | INTRAVENOUS | Status: AC | PRN
  Administered 2016-08-09: 500 [IU]
  Filled 2016-08-09: qty 5

## 2016-08-09 MED ORDER — SODIUM CHLORIDE 0.9% FLUSH
10.0000 mL | INTRAVENOUS | Status: DC | PRN
  Administered 2016-08-09: 10 mL
  Filled 2016-08-09: qty 10

## 2016-08-09 MED ORDER — LIDOCAINE-PRILOCAINE 2.5-2.5 % EX CREA: TOPICAL_CREAM | CUTANEOUS | 3 refills | Status: DC

## 2016-08-09 NOTE — Progress Notes (Signed)
VO given and read back/ Lindsey Cornetto-OK to treat today with ANC of 1.3

## 2016-08-09 NOTE — Patient Instructions (Signed)
[facility] Discharge Instructions for Patients Receiving Chemotherapy  Today you received the following chemotherapy agents Taxol   To help prevent nausea and vomiting after your treatment, we encourage you to take your nausea medication as directed.   If you develop nausea and vomiting that is not controlled by your nausea medication, call the clinic.   BELOW ARE SYMPTOMS THAT SHOULD BE REPORTED IMMEDIATELY:  *FEVER GREATER THAN 100.5 F  *CHILLS WITH OR WITHOUT FEVER  NAUSEA AND VOMITING THAT IS NOT CONTROLLED WITH YOUR NAUSEA MEDICATION  *UNUSUAL SHORTNESS OF BREATH  *UNUSUAL BRUISING OR BLEEDING  TENDERNESS IN MOUTH AND THROAT WITH OR WITHOUT PRESENCE OF ULCERS  *URINARY PROBLEMS  *BOWEL PROBLEMS  UNUSUAL RASH Items with * indicate a potential emergency and should be followed up as soon as possible.  Feel free to call the clinic you have any questions or concerns. The clinic phone number is [phone].  Please show the CHEMO ALERT CARD at check-in to the Emergency Department and triage nurse.   

## 2016-08-12 DIAGNOSIS — Z4822 Encounter for aftercare following kidney transplant: Secondary | ICD-10-CM | POA: Diagnosis not present

## 2016-08-12 DIAGNOSIS — E212 Other hyperparathyroidism: Secondary | ICD-10-CM | POA: Diagnosis not present

## 2016-08-12 DIAGNOSIS — B379 Candidiasis, unspecified: Secondary | ICD-10-CM | POA: Diagnosis not present

## 2016-08-12 DIAGNOSIS — D899 Disorder involving the immune mechanism, unspecified: Secondary | ICD-10-CM | POA: Diagnosis not present

## 2016-08-12 DIAGNOSIS — Z7952 Long term (current) use of systemic steroids: Secondary | ICD-10-CM | POA: Diagnosis not present

## 2016-08-12 DIAGNOSIS — N051 Unspecified nephritic syndrome with focal and segmental glomerular lesions: Secondary | ICD-10-CM | POA: Diagnosis not present

## 2016-08-12 DIAGNOSIS — I10 Essential (primary) hypertension: Secondary | ICD-10-CM | POA: Diagnosis not present

## 2016-08-12 DIAGNOSIS — Z5181 Encounter for therapeutic drug level monitoring: Secondary | ICD-10-CM | POA: Diagnosis not present

## 2016-08-12 DIAGNOSIS — Z48288 Encounter for aftercare following multiple organ transplant: Secondary | ICD-10-CM | POA: Diagnosis not present

## 2016-08-12 DIAGNOSIS — Z940 Kidney transplant status: Secondary | ICD-10-CM | POA: Diagnosis not present

## 2016-08-12 DIAGNOSIS — Z79899 Other long term (current) drug therapy: Secondary | ICD-10-CM | POA: Diagnosis not present

## 2016-08-12 DIAGNOSIS — Z7982 Long term (current) use of aspirin: Secondary | ICD-10-CM | POA: Diagnosis not present

## 2016-08-12 DIAGNOSIS — E869 Volume depletion, unspecified: Secondary | ICD-10-CM | POA: Diagnosis not present

## 2016-08-12 DIAGNOSIS — N186 End stage renal disease: Secondary | ICD-10-CM | POA: Diagnosis not present

## 2016-08-12 DIAGNOSIS — I120 Hypertensive chronic kidney disease with stage 5 chronic kidney disease or end stage renal disease: Secondary | ICD-10-CM | POA: Diagnosis not present

## 2016-08-12 DIAGNOSIS — C50919 Malignant neoplasm of unspecified site of unspecified female breast: Secondary | ICD-10-CM | POA: Diagnosis not present

## 2016-08-16 ENCOUNTER — Encounter: Payer: Self-pay | Admitting: *Deleted

## 2016-08-16 ENCOUNTER — Ambulatory Visit (HOSPITAL_BASED_OUTPATIENT_CLINIC_OR_DEPARTMENT_OTHER): Payer: Medicare Other

## 2016-08-16 ENCOUNTER — Ambulatory Visit (HOSPITAL_BASED_OUTPATIENT_CLINIC_OR_DEPARTMENT_OTHER): Payer: Medicare Other | Admitting: Oncology

## 2016-08-16 ENCOUNTER — Other Ambulatory Visit (HOSPITAL_BASED_OUTPATIENT_CLINIC_OR_DEPARTMENT_OTHER): Payer: Medicare Other

## 2016-08-16 VITALS — BP 128/84 | HR 83 | Temp 98.1°F | Resp 18

## 2016-08-16 DIAGNOSIS — N185 Chronic kidney disease, stage 5: Secondary | ICD-10-CM

## 2016-08-16 DIAGNOSIS — N186 End stage renal disease: Secondary | ICD-10-CM | POA: Diagnosis not present

## 2016-08-16 DIAGNOSIS — C50312 Malignant neoplasm of lower-inner quadrant of left female breast: Secondary | ICD-10-CM

## 2016-08-16 DIAGNOSIS — N051 Unspecified nephritic syndrome with focal and segmental glomerular lesions: Secondary | ICD-10-CM

## 2016-08-16 DIAGNOSIS — Z5111 Encounter for antineoplastic chemotherapy: Secondary | ICD-10-CM | POA: Diagnosis not present

## 2016-08-16 DIAGNOSIS — Z170 Estrogen receptor positive status [ER+]: Secondary | ICD-10-CM | POA: Diagnosis not present

## 2016-08-16 DIAGNOSIS — N269 Renal sclerosis, unspecified: Secondary | ICD-10-CM

## 2016-08-16 LAB — CBC WITH DIFFERENTIAL/PLATELET
BASO%: 1 % (ref 0.0–2.0)
BASOS ABS: 0 10*3/uL (ref 0.0–0.1)
EOS%: 3.3 % (ref 0.0–7.0)
Eosinophils Absolute: 0.1 10*3/uL (ref 0.0–0.5)
HCT: 37.1 % (ref 34.8–46.6)
HEMOGLOBIN: 11.4 g/dL — AB (ref 11.6–15.9)
LYMPH%: 19.6 % (ref 14.0–49.7)
MCH: 27.3 pg (ref 25.1–34.0)
MCHC: 30.7 g/dL — ABNORMAL LOW (ref 31.5–36.0)
MCV: 89 fL (ref 79.5–101.0)
MONO#: 0.7 10*3/uL (ref 0.1–0.9)
MONO%: 23.9 % — AB (ref 0.0–14.0)
NEUT#: 1.6 10*3/uL (ref 1.5–6.5)
NEUT%: 52.2 % (ref 38.4–76.8)
Platelets: 219 10*3/uL (ref 145–400)
RBC: 4.17 10*6/uL (ref 3.70–5.45)
RDW: 18.5 % — ABNORMAL HIGH (ref 11.2–14.5)
WBC: 3 10*3/uL — ABNORMAL LOW (ref 3.9–10.3)
lymph#: 0.6 10*3/uL — ABNORMAL LOW (ref 0.9–3.3)
nRBC: 0 % (ref 0–0)

## 2016-08-16 LAB — COMPREHENSIVE METABOLIC PANEL
ALBUMIN: 3.9 g/dL (ref 3.5–5.0)
ALK PHOS: 79 U/L (ref 40–150)
ALT: 18 U/L (ref 0–55)
ANION GAP: 10 meq/L (ref 3–11)
AST: 13 U/L (ref 5–34)
BUN: 10.8 mg/dL (ref 7.0–26.0)
CO2: 28 mEq/L (ref 22–29)
Calcium: 10.2 mg/dL (ref 8.4–10.4)
Chloride: 107 mEq/L (ref 98–109)
Creatinine: 0.8 mg/dL (ref 0.6–1.1)
GLUCOSE: 98 mg/dL (ref 70–140)
POTASSIUM: 3.7 meq/L (ref 3.5–5.1)
SODIUM: 145 meq/L (ref 136–145)
Total Bilirubin: 0.42 mg/dL (ref 0.20–1.20)
Total Protein: 6.5 g/dL (ref 6.4–8.3)

## 2016-08-16 LAB — TECHNOLOGIST REVIEW

## 2016-08-16 MED ORDER — SODIUM CHLORIDE 0.9 % IV SOLN
Freq: Once | INTRAVENOUS | Status: AC
  Administered 2016-08-16: 10:00:00 via INTRAVENOUS

## 2016-08-16 MED ORDER — DIPHENHYDRAMINE HCL 25 MG PO CAPS
ORAL_CAPSULE | ORAL | Status: AC
  Filled 2016-08-16: qty 1

## 2016-08-16 MED ORDER — HEPARIN SOD (PORK) LOCK FLUSH 100 UNIT/ML IV SOLN
500.0000 [IU] | Freq: Once | INTRAVENOUS | Status: AC | PRN
  Administered 2016-08-16: 500 [IU]
  Filled 2016-08-16: qty 5

## 2016-08-16 MED ORDER — DEXAMETHASONE SODIUM PHOSPHATE 10 MG/ML IJ SOLN
INTRAMUSCULAR | Status: AC
  Filled 2016-08-16: qty 1

## 2016-08-16 MED ORDER — FAMOTIDINE IN NACL 20-0.9 MG/50ML-% IV SOLN
INTRAVENOUS | Status: AC
  Filled 2016-08-16: qty 50

## 2016-08-16 MED ORDER — DEXTROSE 5 % IV SOLN
80.0000 mg/m2 | Freq: Once | INTRAVENOUS | Status: AC
  Administered 2016-08-16: 144 mg via INTRAVENOUS
  Filled 2016-08-16: qty 24

## 2016-08-16 MED ORDER — DIPHENHYDRAMINE HCL 25 MG PO CAPS
25.0000 mg | ORAL_CAPSULE | Freq: Once | ORAL | Status: AC
  Administered 2016-08-16: 25 mg via ORAL

## 2016-08-16 MED ORDER — DEXAMETHASONE SODIUM PHOSPHATE 10 MG/ML IJ SOLN
4.0000 mg | Freq: Once | INTRAMUSCULAR | Status: AC
  Administered 2016-08-16: 4 mg via INTRAVENOUS

## 2016-08-16 MED ORDER — SODIUM CHLORIDE 0.9% FLUSH
10.0000 mL | INTRAVENOUS | Status: DC | PRN
  Administered 2016-08-16: 10 mL
  Filled 2016-08-16: qty 10

## 2016-08-16 MED ORDER — FAMOTIDINE IN NACL 20-0.9 MG/50ML-% IV SOLN
20.0000 mg | Freq: Once | INTRAVENOUS | Status: AC
  Administered 2016-08-16: 20 mg via INTRAVENOUS

## 2016-08-16 NOTE — Progress Notes (Signed)
[facility]  Telephone:([phone]) [phone] Fax:(336) [phone]     ID: Erika [facility] DOB: 22-Feb-1961  MR#: [id number]  RSW#:[id number]  Patient Care Team: [name], MD as PCP - General (Obstetrics and Gynecology) [name], MD as Consulting Physician (Nephrology) [name], MD as Consulting Physician (Obstetrics and Gynecology) [name], MD as Consulting Physician (General Surgery) Audree Schrecengost, [name], MD as Consulting Physician (Oncology) [name], MD as Consulting Physician (Radiation Oncology) [name], MD as Referring Physician (Surgery) [name], DO (Internal Medicine) [name], MD OTHER MD:  CHIEF COMPLAINT: Estrogen receptor positive breast cancer  CURRENT TREATMENT: Neoadjuvant chemotherapy  INTERVAL HISTORY: [facility] returns today for follow-up and treatment of her estrogen receptor positive breast cancer, accompanied by her husband Erika Freeman. Today is day 1 cycle 4 of 12 planned cycles of paclitaxel. So far she is tolerating this well, and in particular she has had no problems with peripheral neuropathy.  She was a little bit mixed today because of some scheduling confusions and delays but she is really very nice inpatient and "cut [place] a little slack".   REVIEW OF SYSTEMS: She denies any peripheral neuropathy. She has not had any problems with unusual headaches or visual changes. She denies cough, phlegm production or pleurisy. There have been no intercurrent infections. A detailed review of systems today was otherwise noncontributory  BREAST CANCER HISTORY: From the original intake note:  "Erika [facility]" herself noted a mass in her left breast. She ignored it because she thought it was related to menstruation. However as it persisted through 1. She brought it to Dr. [name] attention and on 05/10/2016 she underwent left diagnostic mammography with tomography [facility] The  breast density was category C. In the left breast lower central area there was a new circumscribed mass. This was firm and fixed in the lower inner left breast at middle depth. Ultrasonography confirmed a 2.6 cm hypoechoic irregular mass at the 6:30 o'clock radiant 3 cm from the nipple. There was also a 0.5 cm nodule 1.4 cm medial to the mass just described.  Biopsy of the left breast mass in question 05/10/2016 showed([id number]) invasive ductal carcinoma, grade 3, estrogen receptor 70% positive with weak staining intensity, progesterone receptor negative, with no HER-2 amplification, the signals ratio being 1.52 and the number per cell 2.35. The proliferation marker was 90%.  Her subsequent history is as detailed below  OF NOTE: The patient has a history of focal segmental glomerular sclerosis with end-stage renal disease and is status post unrelated donor renal transplant 04/15/2013 as part of appeared donor exchange with her husband donating one kidney, the other harvested from a 55 year old [place] man. She continues on immunosuppression with mycophenolate and tacrolimus.  PAST MEDICAL HISTORY: Past Medical History:  Diagnosis Date  . Anemia   . Cancer [facility])    breast cancer  . Chills   . Chronic kidney disease    resolved kidney transplant   . Cough   . Fibroid tumor   . GERD (gastroesophageal reflux disease)   . Heart murmur   . Hypertension    DX  30 YRS AGO  . Umbilical hernia s/p primary repair 01/10/2012 01/24/2012  . Vomiting     PAST SURGICAL HISTORY: Past Surgical History:  Procedure Laterality Date  . BREAST SURGERY  2012   left - fibroadenoma  . CAPD INSERTION  01/10/2012   Procedure: LAPAROSCOPIC INSERTION CONTINUOUS AMBULATORY PERITONEAL DIALYSIS  (CAPD) CATHETER;  Surgeon: Adin Hector, MD;  Location: [facility];  Service: General;  Laterality: N/A;  LAPAROSCOPIC CAPD CATHETER PLACEMENT OMENTOPEXY  . CESAREAN SECTION  1996  . DILATION AND CURETTAGE OF UTERUS    .  KIDNEY TRANSPLANT    . PARATHYROIDECTOMY  2000 - approximate  . PORTACATH PLACEMENT Right 05/24/2016   Procedure: INSERTION PORT-A-CATH WITH [place];  Surgeon: [name], MD;  Location: [facility];  Service: General;  Laterality: Right;  . RENAL BIOPSY  2011  . UMBILICAL HERNIA REPAIR  01/10/2012   Procedure: HERNIA REPAIR UMBILICAL ADULT;  Surgeon: Adin Hector, MD;  Location: [facility];  Service: General;  Laterality: N/A;    FAMILY HISTORY Family History  Problem Relation Age of Onset  . Cancer Maternal Uncle        lung  . Cancer Cousin        breast  . Cancer Cousin        breast  The patient has very little information regarding her father. Her mother is living, 77 years old as of March 2018. The patient had one full brother, diagnosed with prostate cancer in his 58s. The patient has 2 half-brothers and 1 half-sister. There is no history of breast or ovarian cancer in the family to the patient's knowledge   GYNECOLOGIC HISTORY:  No LMP recorded.  Menarche age 39, first live birth age 77, the patient is GX P3. Her periods are regular, the last 5 or 6 days, with no heavy days. 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 Her husband Erika Freeman works at home [employer]. Son [name] lives in [place] and as Freight forwarder of a Therapist, art. Son [name] lives in nights [place] and is a Air traffic controller. Son [name] lives in [place] and is studying based trombone. The patient has 2 grandchildren. She attends a local [employer]:  not in place    HEALTH MAINTENANCE: Social History  Substance Use Topics  . Smoking status: Never Smoker  . Smokeless tobacco: Never Used  . Alcohol use No     Colonoscopy:  PAP:  Bone density:   Allergies  Allergen Reactions  . No Known Allergies     Current Outpatient Prescriptions  Medication Sig Dispense  Refill  . aspirin 81 MG tablet Take 81 mg by mouth every morning.     . cholestyramine (QUESTRAN) 4 g packet Take 1 packet (4 g total) by mouth 3 (three) times daily with meals. (Patient taking differently: Take 4 g by mouth 4 (four) times daily as needed. ) 60 each 12  . diphenhydrAMINE (BENADRYL) 25 MG tablet Take 25 mg by mouth daily as needed for allergies.    . fluconazole (DIFLUCAN) 100 MG tablet Take 100 mg by mouth daily.  0  . Iron, Ferrous Sulfate, 142 (45 Fe) MG TBCR Take 1 tablet by mouth every morning.     . lidocaine-prilocaine (EMLA) cream Apply to affected area once 30 g 3  . loratadine (CLARITIN) 10 MG tablet Take 1 tablet (10 mg total) by mouth daily. 100 tablet 0  . LORazepam (ATIVAN) 0.5 MG tablet Take 1 tablet (0.5 mg total) by mouth at bedtime. (Patient not taking: Reported on 08/09/2016) 30 tablet 0  . lovastatin (MEVACOR) 20 MG tablet Take 20 mg by mouth every Monday, Wednesday, and Friday.     . nystatin cream (MYCOSTATIN) Apply  1 application topically 2 (two) times daily. 30 g 0  . potassium chloride (MICRO-K) 10 MEQ CR capsule TAKE ONE CAPSULE BY MOUTH DAILY (Patient not taking: Reported on 08/09/2016) 90 capsule 3  . predniSONE (DELTASONE) 5 MG tablet Take 5 mg by mouth every morning.     . prochlorperazine (COMPAZINE) 10 MG tablet TAKE 1 TABLET BY MOUTH EVERY 6 HOURS AS NEEDED FOR NAUSEA OR VOMITING (Patient taking differently: TAKE 10 MG BY MOUTH EVERY 6 HOURS AS NEEDED FOR NAUSEA OR VOMITING) 385 tablet 1  . SENSIPAR 60 MG tablet Take 60 mg by mouth 3 (three) times a week.     . tacrolimus (PROGRAF) 1 MG capsule Take 3 mg by mouth 2 (two) times daily.     No current facility-administered medications for this visit.     OBJECTIVE: Middle-aged African-American woman examined on the treating chair  Vitals:     There is no height or weight on file to calculate BMI.  Vitals 08/16/2016 as recorded in the treatment area     ECOG FS:1 - Symptomatic but completely  ambulatory  Sclerae unicteric, EOMs intact Oropharynx clear and moist No cervical or supraclavicular adenopathy Lungs no rales or rhonchi Heart regular rate and rhythm Abd soft, nontender, positive bowel sounds MSK no focal spinal tenderness, no upper extremity lymphedema Neuro: nonfocal, well oriented, appropriate affect Breasts: Deferred     LAB RESULTS:  CMP     Component Value Date/Time   NA 145 08/16/2016 0842   K 3.7 08/16/2016 0842   CL 106 07/15/2016 2008   CO2 28 08/16/2016 0842   GLUCOSE 98 08/16/2016 0842   BUN 10.8 08/16/2016 0842   CREATININE 0.8 08/16/2016 0842   CALCIUM 10.2 08/16/2016 0842   PROT 6.5 08/16/2016 0842   ALBUMIN 3.9 08/16/2016 0842   AST 13 08/16/2016 0842   ALT 18 08/16/2016 0842   ALKPHOS 79 08/16/2016 0842   BILITOT 0.42 08/16/2016 0842   GFRNONAA >60 07/15/2016 2008   GFRAA >60 07/15/2016 2008    No results found for: Ronnald Ramp, A1GS, A2GS, BETS, BETA2SER, GAMS, MSPIKE, SPEI  No results found for: Nils Pyle, [facility]  Lab Results  Component Value Date   WBC 3.0 (L) 08/16/2016   NEUTROABS 1.6 08/16/2016   HGB 11.4 (L) 08/16/2016   HCT 37.1 08/16/2016   MCV 89.0 08/16/2016   PLT 219 08/16/2016      Chemistry      Component Value Date/Time   NA 145 08/16/2016 0842   K 3.7 08/16/2016 0842   CL 106 07/15/2016 2008   CO2 28 08/16/2016 0842   BUN 10.8 08/16/2016 0842   CREATININE 0.8 08/16/2016 0842      Component Value Date/Time   CALCIUM 10.2 08/16/2016 0842   ALKPHOS 79 08/16/2016 0842   AST 13 08/16/2016 0842   ALT 18 08/16/2016 0842   BILITOT 0.42 08/16/2016 0842       No results found for: LABCA2  No components found for: [id number]  No results for input(s): INR in the last 168 hours.  Urinalysis    Component Value Date/Time   COLORURINE YELLOW 11/24/2006 [place] 11/24/2006 1539   LABSPEC 1.018 11/24/2006 1539   PHURINE 6.0 11/24/2006 1539   GLUCOSEU  NEGATIVE 11/24/2006 1539   HGBUR SMALL (A) 11/24/2006 1539   BILIRUBINUR NEGATIVE 11/24/2006 1539   KETONESUR NEGATIVE 11/24/2006 1539   PROTEINUR 100 (A) 11/24/2006 1539   UROBILINOGEN 1.0 11/24/2006 1539   NITRITE  NEGATIVE 11/24/2006 1539   LEUKOCYTESUR MODERATE (A) 11/24/2006 1539     STUDIES: No results found.  ELIGIBLE FOR AVAILABLE RESEARCH PROTOCOL: no  ASSESSMENT: 56 y.o. [place], [place] woman status post left breast lower inner quadrant biopsy 05/10/2016 for a clinical  T2 N0, prognostic stage IIB invaside ductal carcinoma, grade 3, weekly estrogen receptor positive, progesterone receptor negative, with no HER-2 amplification, and the MIB-1 at 90%  (a) biopsy of the 0.6 mm satellite at the 6:30 o'clock radiant in the left breast 06/02/2016 showed invasive ductal carcinoma, grade 3, estrogen receptor 20% positive, with weak staining intensity, progesterone receptor negative with no HER-2 amplification, and an MIB-1 of 70% (identical to larger mass)   (1) neoadjuvant chemotherapy consisting of doxorubicin and cyclophosphamide in dose dense fashion 4 started 06/01/2016, completed 07/12/2016  followed by Abraxane weekly 12 started 07/26/2016  (2) definitive surgery to follow, with breast conservation preferred if feasible   (a) satellite lesion noted on [place] to be included in final surgery sample  (3) adjuvant radiation as appropriate  (4) anti-estrogens to follow the completion of local treatment  PLAN: Erika Freeman Is proceeding to her fourth cycle of weekly paclitaxel today, with excellent counts and excellent tolerance. Most importantly she has not had any peripheral neuropathy to date.  We're going to continue to see her on a weekly basis and stop treatment at the first sign of neuropathy or other significant toxicity.  She knows to call for any other issues that may develop before her next visit.   [name], MD   08/16/2016 8:34 PM Medical Oncology and Hematology [facility] [address] [place], [place] [address] Tel. [phone]    Fax. [phone]

## 2016-08-16 NOTE — Patient Instructions (Signed)
[facility] Discharge Instructions for Patients Receiving Chemotherapy  Today you received the following chemotherapy agents Taxol   To help prevent nausea and vomiting after your treatment, we encourage you to take your nausea medication as directed.   If you develop nausea and vomiting that is not controlled by your nausea medication, call the clinic.   BELOW ARE SYMPTOMS THAT SHOULD BE REPORTED IMMEDIATELY:  *FEVER GREATER THAN 100.5 F  *CHILLS WITH OR WITHOUT FEVER  NAUSEA AND VOMITING THAT IS NOT CONTROLLED WITH YOUR NAUSEA MEDICATION  *UNUSUAL SHORTNESS OF BREATH  *UNUSUAL BRUISING OR BLEEDING  TENDERNESS IN MOUTH AND THROAT WITH OR WITHOUT PRESENCE OF ULCERS  *URINARY PROBLEMS  *BOWEL PROBLEMS  UNUSUAL RASH Items with * indicate a potential emergency and should be followed up as soon as possible.  Feel free to call the clinic you have any questions or concerns. The clinic phone number is [phone].  Please show the CHEMO ALERT CARD at check-in to the Emergency Department and triage nurse.   

## 2016-08-23 ENCOUNTER — Encounter: Payer: Self-pay | Admitting: *Deleted

## 2016-08-23 ENCOUNTER — Ambulatory Visit (HOSPITAL_BASED_OUTPATIENT_CLINIC_OR_DEPARTMENT_OTHER): Payer: Medicare Other

## 2016-08-23 ENCOUNTER — Other Ambulatory Visit (HOSPITAL_BASED_OUTPATIENT_CLINIC_OR_DEPARTMENT_OTHER): Payer: Medicare Other

## 2016-08-23 VITALS — BP 123/89 | HR 83 | Temp 98.3°F | Resp 19

## 2016-08-23 DIAGNOSIS — Z5111 Encounter for antineoplastic chemotherapy: Secondary | ICD-10-CM | POA: Diagnosis not present

## 2016-08-23 DIAGNOSIS — N051 Unspecified nephritic syndrome with focal and segmental glomerular lesions: Secondary | ICD-10-CM

## 2016-08-23 DIAGNOSIS — Z170 Estrogen receptor positive status [ER+]: Principal | ICD-10-CM

## 2016-08-23 DIAGNOSIS — C50312 Malignant neoplasm of lower-inner quadrant of left female breast: Secondary | ICD-10-CM

## 2016-08-23 DIAGNOSIS — N185 Chronic kidney disease, stage 5: Secondary | ICD-10-CM

## 2016-08-23 LAB — CBC WITH DIFFERENTIAL/PLATELET
BASO%: 0.4 % (ref 0.0–2.0)
Basophils Absolute: 0 10*3/uL (ref 0.0–0.1)
EOS ABS: 0.1 10*3/uL (ref 0.0–0.5)
EOS%: 1.4 % (ref 0.0–7.0)
HCT: 37.3 % (ref 34.8–46.6)
HGB: 12.1 g/dL (ref 11.6–15.9)
LYMPH%: 8.9 % — AB (ref 14.0–49.7)
MCH: 27.8 pg (ref 25.1–34.0)
MCHC: 32.3 g/dL (ref 31.5–36.0)
MCV: 86.2 fL (ref 79.5–101.0)
MONO#: 0.5 10*3/uL (ref 0.1–0.9)
MONO%: 6.6 % (ref 0.0–14.0)
NEUT%: 82.7 % — ABNORMAL HIGH (ref 38.4–76.8)
NEUTROS ABS: 5.7 10*3/uL (ref 1.5–6.5)
Platelets: 176 10*3/uL (ref 145–400)
RBC: 4.33 10*6/uL (ref 3.70–5.45)
RDW: 20.2 % — ABNORMAL HIGH (ref 11.2–14.5)
WBC: 6.9 10*3/uL (ref 3.9–10.3)
lymph#: 0.6 10*3/uL — ABNORMAL LOW (ref 0.9–3.3)

## 2016-08-23 LAB — COMPREHENSIVE METABOLIC PANEL
ALT: 19 U/L (ref 0–55)
AST: 17 U/L (ref 5–34)
Albumin: 4 g/dL (ref 3.5–5.0)
Alkaline Phosphatase: 78 U/L (ref 40–150)
Anion Gap: 10 mEq/L (ref 3–11)
BUN: 11.2 mg/dL (ref 7.0–26.0)
CO2: 27 meq/L (ref 22–29)
Calcium: 9.6 mg/dL (ref 8.4–10.4)
Chloride: 107 mEq/L (ref 98–109)
Creatinine: 0.8 mg/dL (ref 0.6–1.1)
GLUCOSE: 102 mg/dL (ref 70–140)
POTASSIUM: 3.7 meq/L (ref 3.5–5.1)
SODIUM: 144 meq/L (ref 136–145)
Total Bilirubin: 0.38 mg/dL (ref 0.20–1.20)
Total Protein: 6.4 g/dL (ref 6.4–8.3)

## 2016-08-23 MED ORDER — DEXAMETHASONE SODIUM PHOSPHATE 10 MG/ML IJ SOLN
4.0000 mg | Freq: Once | INTRAMUSCULAR | Status: AC
  Administered 2016-08-23: 4 mg via INTRAVENOUS

## 2016-08-23 MED ORDER — DIPHENHYDRAMINE HCL 25 MG PO CAPS
25.0000 mg | ORAL_CAPSULE | Freq: Once | ORAL | Status: AC
  Administered 2016-08-23: 25 mg via ORAL

## 2016-08-23 MED ORDER — SODIUM CHLORIDE 0.9% FLUSH
10.0000 mL | INTRAVENOUS | Status: DC | PRN
  Administered 2016-08-23: 10 mL
  Filled 2016-08-23: qty 10

## 2016-08-23 MED ORDER — FAMOTIDINE IN NACL 20-0.9 MG/50ML-% IV SOLN
INTRAVENOUS | Status: AC
  Filled 2016-08-23: qty 50

## 2016-08-23 MED ORDER — PACLITAXEL CHEMO INJECTION 300 MG/50ML
80.0000 mg/m2 | Freq: Once | INTRAVENOUS | Status: AC
  Administered 2016-08-23: 144 mg via INTRAVENOUS
  Filled 2016-08-23: qty 24

## 2016-08-23 MED ORDER — DIPHENHYDRAMINE HCL 25 MG PO CAPS
ORAL_CAPSULE | ORAL | Status: AC
  Filled 2016-08-23: qty 1

## 2016-08-23 MED ORDER — DEXAMETHASONE SODIUM PHOSPHATE 10 MG/ML IJ SOLN
INTRAMUSCULAR | Status: AC
  Filled 2016-08-23: qty 1

## 2016-08-23 MED ORDER — HEPARIN SOD (PORK) LOCK FLUSH 100 UNIT/ML IV SOLN
500.0000 [IU] | Freq: Once | INTRAVENOUS | Status: AC | PRN
  Administered 2016-08-23: 500 [IU]
  Filled 2016-08-23: qty 5

## 2016-08-23 MED ORDER — FAMOTIDINE IN NACL 20-0.9 MG/50ML-% IV SOLN
20.0000 mg | Freq: Once | INTRAVENOUS | Status: AC
  Administered 2016-08-23: 20 mg via INTRAVENOUS

## 2016-08-23 MED ORDER — SODIUM CHLORIDE 0.9 % IV SOLN
Freq: Once | INTRAVENOUS | Status: AC
  Administered 2016-08-23: 11:00:00 via INTRAVENOUS

## 2016-08-23 NOTE — Patient Instructions (Signed)
[facility] Discharge Instructions for Patients Receiving Chemotherapy  Today you received the following chemotherapy agents Taxol   To help prevent nausea and vomiting after your treatment, we encourage you to take your nausea medication as directed.   If you develop nausea and vomiting that is not controlled by your nausea medication, call the clinic.   BELOW ARE SYMPTOMS THAT SHOULD BE REPORTED IMMEDIATELY:  *FEVER GREATER THAN 100.5 F  *CHILLS WITH OR WITHOUT FEVER  NAUSEA AND VOMITING THAT IS NOT CONTROLLED WITH YOUR NAUSEA MEDICATION  *UNUSUAL SHORTNESS OF BREATH  *UNUSUAL BRUISING OR BLEEDING  TENDERNESS IN MOUTH AND THROAT WITH OR WITHOUT PRESENCE OF ULCERS  *URINARY PROBLEMS  *BOWEL PROBLEMS  UNUSUAL RASH Items with * indicate a potential emergency and should be followed up as soon as possible.  Feel free to call the clinic you have any questions or concerns. The clinic phone number is [phone].  Please show the CHEMO ALERT CARD at check-in to the Emergency Department and triage nurse.   

## 2016-08-29 NOTE — Progress Notes (Signed)
[facility]  [phone] [phone]     ID: Erika Freeman DOB: 1960-08-02  MR#: [id number]  LDJ#:[id number]  Patient Care Team: [name], MD as PCP - General (Obstetrics and Gynecology) [name], MD as Consulting Physician (Nephrology) [name], MD as Consulting Physician (Obstetrics and Gynecology) [name], MD as Consulting Physician (General Surgery) Magrinat, [name], MD as Consulting Physician (Oncology) [name], MD as Consulting Physician (Radiation Oncology) [name], MD as Referring Physician (Surgery) [name], DO (Internal Medicine) Scot Dock, NP OTHER MD:  CHIEF COMPLAINT: Estrogen receptor positive breast cancer  CURRENT TREATMENT: Neoadjuvant chemotherapy  INTERVAL HISTORY: Erika Freeman is here today for week 6 of neoadjuvant Paclitaxel.  She continues to tolerate the treatment well.  She is feeling more energized and has not developed any numbness or tingling in her fingertips.  She can no longer feel her breast mass.  She is continuing to take Questran for some diarrhea related to the chemotherapy.   REVIEW OF SYSTEMS: Erika Freeman is starting to develop bruising on her thumb nails that she noticed this week.  She says her nails feel sore towards the nail bed.  She denies any fevers, chills, mucositis, rash, or any further concerns.  A detailed ROS was conducted and is otherwise negative.   BREAST CANCER HISTORY: From the original intake note:  "Erika Freeman" herself noted a mass in her left breast. She ignored it because she thought it was related to menstruation. However as it persisted through 1. She brought it to Dr. [name] attention and on 05/10/2016 she underwent left diagnostic mammography with tomography and [facility] The breast density was category C. In the left breast lower central area there was a new circumscribed mass. This was firm  and fixed in the lower inner left breast at middle depth. Ultrasonography confirmed a 2.6 cm hypoechoic irregular mass at the 6:30 o'clock radiant 3 cm from the nipple. There was also a 0.5 cm nodule 1.4 cm medial to the mass just described.  Biopsy of the left breast mass in question 05/10/2016 showed([id number] [id number]) invasive ductal carcinoma, grade 3, estrogen receptor 70% positive with weak staining intensity, progesterone receptor negative, with no HER-2 amplification, the signals ratio being 1.52 and the number per cell 2.35. The proliferation marker was 90%.  Her subsequent history is as detailed below  OF NOTE: The patient has a history of focal segmental glomerular sclerosis with end-stage renal disease and is status post unrelated donor renal transplant 04/15/2013 as part of appeared donor exchange with her husband donating one kidney, the other harvested from a 55 year old [place] man. She continues on immunosuppression with mycophenolate and tacrolimus.  PAST MEDICAL HISTORY: Past Medical History:  Diagnosis Date  . Anemia   . Cancer [facility])    breast cancer  . Chills   . Chronic kidney disease    resolved kidney transplant   . Cough   . Fibroid tumor   . GERD (gastroesophageal reflux disease)   . Heart murmur   . Hypertension    DX  30 YRS AGO  . Umbilical hernia s/p primary repair 01/10/2012 01/24/2012  . Vomiting     PAST SURGICAL HISTORY: Past Surgical History:  Procedure Laterality Date  . BREAST SURGERY  2012   left - fibroadenoma  . CAPD INSERTION  01/10/2012   Procedure: LAPAROSCOPIC INSERTION CONTINUOUS AMBULATORY PERITONEAL DIALYSIS  (CAPD) CATHETER;  Surgeon: Adin Hector, MD;  Location: [facility]  OR;  Service: General;  Laterality: N/A;  LAPAROSCOPIC CAPD CATHETER PLACEMENT OMENTOPEXY  . CESAREAN SECTION  1996  . DILATION AND CURETTAGE OF UTERUS    . KIDNEY TRANSPLANT    . PARATHYROIDECTOMY  2000 - approximate  . PORTACATH PLACEMENT Right 05/24/2016   Procedure:  INSERTION PORT-A-CATH WITH [place];  Surgeon: [name], MD;  Location: [facility];  Service: General;  Laterality: Right;  . RENAL BIOPSY  2011  . UMBILICAL HERNIA REPAIR  01/10/2012   Procedure: HERNIA REPAIR UMBILICAL ADULT;  Surgeon: Adin Hector, MD;  Location: [place];  Service: General;  Laterality: N/A;    FAMILY HISTORY Family History  Problem Relation Age of Onset  . Cancer Maternal Uncle        lung  . Cancer Cousin        breast  . Cancer Cousin        breast  The patient has very little information regarding her father. Her mother is living, 1 years old as of March 2018. The patient had one full brother, diagnosed with prostate cancer in his 10s. The patient has 2 half-brothers and 1 half-sister. There is no history of breast or ovarian cancer in the family to the patient's knowledge   GYNECOLOGIC HISTORY:  No LMP recorded.  Menarche age 37, first live birth age 50, the patient is GX P3. Her periods are regular, the last 5 or 6 days, with no heavy days. 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 Her husband white works at home [employer]. Son [name] lives in [place] and as Freight forwarder of a Therapist, art. Son [name] lives in [place] and is a Air traffic controller. Son [name] lives in [place] and is studying based trombone. The patient has 2 grandchildren. She attends a local [place]:  not in place    HEALTH MAINTENANCE: Social History  Substance Use Topics  . Smoking status: Never Smoker  . Smokeless tobacco: Never Used  . Alcohol use No     Colonoscopy:  PAP:  Bone density:   Allergies  Allergen Reactions  . No Known Allergies     Current Outpatient Prescriptions  Medication Sig Dispense Refill  . aspirin 81 MG tablet Take 81 mg by mouth every morning.     . cholestyramine (QUESTRAN) 4 g packet Take  1 packet (4 g total) by mouth 3 (three) times daily with meals. (Patient taking differently: Take 4 g by mouth 4 (four) times daily as needed. ) 60 each 12  . diphenhydrAMINE (BENADRYL) 25 MG tablet Take 25 mg by mouth daily as needed for allergies.    . fluconazole (DIFLUCAN) 100 MG tablet Take 100 mg by mouth daily.  0  . Iron, Ferrous Sulfate, 142 (45 Fe) MG TBCR Take 1 tablet by mouth every morning.     . lidocaine-prilocaine (EMLA) cream Apply to affected area once 30 g 3  . loratadine (CLARITIN) 10 MG tablet Take 1 tablet (10 mg total) by mouth daily. 100 tablet 0  . LORazepam (ATIVAN) 0.5 MG tablet Take 1 tablet (0.5 mg total) by mouth at bedtime. (Patient not taking: Reported on 08/09/2016) 30 tablet 0  . lovastatin (MEVACOR) 20 MG tablet Take 20 mg by mouth every Monday, Wednesday, and Friday.     . nystatin cream (MYCOSTATIN) Apply 1 application topically 2 (two) times daily. [place]  g 0  . potassium chloride (MICRO-K) 10 MEQ CR capsule TAKE ONE CAPSULE BY MOUTH DAILY (Patient not taking: Reported on 08/09/2016) 90 capsule 3  . predniSONE (DELTASONE) 5 MG tablet Take 5 mg by mouth every morning.     . prochlorperazine (COMPAZINE) 10 MG tablet TAKE 1 TABLET BY MOUTH EVERY 6 HOURS AS NEEDED FOR NAUSEA OR VOMITING (Patient taking differently: TAKE 10 MG BY MOUTH EVERY 6 HOURS AS NEEDED FOR NAUSEA OR VOMITING) 385 tablet 1  . SENSIPAR 60 MG tablet Take 60 mg by mouth 3 (three) times a week.     . tacrolimus (PROGRAF) 1 MG capsule Take 3 mg by mouth 2 (two) times daily.     No current facility-administered medications for this visit.     OBJECTIVE:   Vitals:   08/30/16 0952  BP: 125/77  Pulse: 80  Resp: 20  Temp: 97.6 F (36.4 C)     Body mass index is 23.95 kg/m.  GENERAL: Patient is a well appearing female in no acute distress HEENT:  Sclerae anicteric.  Oropharynx clear and moist. No ulcerations or evidence of oropharyngeal candidiasis. Neck is supple.  NODES:  No cervical,  supraclavicular, or axillary lymphadenopathy palpated.  BREAST EXAM:  Unable to palpate left breast mass. LUNGS:  Clear to auscultation bilaterally.  No wheezes or rhonchi. HEART:  Regular rate and rhythm. No murmur appreciated. ABDOMEN:  Soft, nontender.  Positive, normoactive bowel sounds. No organomegaly palpated. MSK:  No focal spinal tenderness to palpation. Full range of motion bilaterally in the upper extremities. EXTREMITIES:  No peripheral edema.   SKIN:  Clear with no obvious rashes or skin changes. First digit nails with bruising near the nail bed, nails are intact.   NEURO:  Nonfocal. Well oriented.  Appropriate affect.       ECOG FS:1 - Symptomatic but completely ambulatory      LAB RESULTS:  CMP     Component Value Date/Time   NA 144 08/23/2016 0928   K 3.7 08/23/2016 0928   CL 106 07/15/2016 2008   CO2 27 08/23/2016 0928   GLUCOSE 102 08/23/2016 0928   BUN 11.2 08/23/2016 0928   CREATININE 0.8 08/23/2016 0928   CALCIUM 9.6 08/23/2016 0928   PROT 6.4 08/23/2016 0928   ALBUMIN 4.0 08/23/2016 0928   AST 17 08/23/2016 0928   ALT 19 08/23/2016 0928   ALKPHOS 78 08/23/2016 0928   BILITOT 0.38 08/23/2016 0928   GFRNONAA >60 07/15/2016 2008   GFRAA >60 07/15/2016 2008    No results found for: Ronnald Ramp, A1GS, A2GS, BETS, BETA2SER, GAMS, MSPIKE, SPEI  No results found for: Nils Pyle, [facility]  Lab Results  Component Value Date   WBC 7.7 08/30/2016   NEUTROABS 6.3 08/30/2016   HGB 12.0 08/30/2016   HCT 37.1 08/30/2016   MCV 87.5 08/30/2016   PLT 179 08/30/2016      Chemistry      Component Value Date/Time   NA 144 08/23/2016 0928   K 3.7 08/23/2016 0928   CL 106 07/15/2016 2008   CO2 27 08/23/2016 0928   BUN 11.2 08/23/2016 0928   CREATININE 0.8 08/23/2016 0928      Component Value Date/Time   CALCIUM 9.6 08/23/2016 0928   ALKPHOS 78 08/23/2016 0928   AST 17 08/23/2016 0928   ALT 19 08/23/2016 0928   BILITOT  0.38 08/23/2016 0928       No results found for: LABCA2  No components found for: [id number]  No results for input(s): INR in the last 168 hours.  Urinalysis    Component Value Date/Time   COLORURINE YELLOW 11/24/2006 1539   APPEARANCEUR CLEAR 11/24/2006 1539   LABSPEC 1.018 11/24/2006 1539   PHURINE 6.0 11/24/2006 1539   GLUCOSEU NEGATIVE 11/24/2006 1539   HGBUR SMALL (A) 11/24/2006 1539   BILIRUBINUR NEGATIVE 11/24/2006 1539   KETONESUR NEGATIVE 11/24/2006 1539   PROTEINUR 100 (A) 11/24/2006 1539   UROBILINOGEN 1.0 11/24/2006 1539   NITRITE NEGATIVE 11/24/2006 1539   LEUKOCYTESUR MODERATE (A) 11/24/2006 1539     STUDIES: No results found.  ELIGIBLE FOR AVAILABLE RESEARCH PROTOCOL: no  ASSESSMENT: 56 y.o. [place], [place] woman status post left breast lower inner quadrant biopsy 05/10/2016 for a clinical  T2 N0, prognostic stage IIB invaside ductal carcinoma, grade 3, weekly estrogen receptor positive, progesterone receptor negative, with no HER-2 amplification, and the MIB-1 at 90%  (a) biopsy of the 0.6 mm satellite at the 6:30 o'clock radiant in the left breast 06/02/2016 showed invasive ductal carcinoma, grade 3, estrogen receptor 20% positive, with weak staining intensity, progesterone receptor negative with no HER-2 amplification, and an MIB-1 of 70% (identical to larger mass)   (1) neoadjuvant chemotherapy consisting of doxorubicin and cyclophosphamide in dose dense fashion 4 started 06/01/2016, completed 07/12/2016  followed by Paclitaxel weekly 12 started 07/26/2016  (2) definitive surgery to follow, with breast conservation preferred if feasible   (a) satellite lesion noted on [place] to be included in final surgery sample  (3) adjuvant radiation as appropriate  (4) anti-estrogens to follow the completion of local treatment  PLAN: Erika Freeman is doing well today.  I reviewed her labs with her and they were normal.  She will proceed with her sixth cycle of Paclitaxel.  She  and I reviewed that she is developing a nail dyscrasia.  We reviewed the use of cuticle creams, wearing gloves while doing the dishes, and cutting her nails shorter.    The above plan was reviewed with the patient in detail.  She is in agreement with it.  She knows to call for any questions or concerns prior to her next appointment here.    A total of (30) minutes of face-to-face time was spent with this patient with greater than 50% of that time in counseling and care-coordination.   Scot Dock, NP   08/30/2016 10:01 AM Medical Oncology and Hematology [facility] [address] [place], [place] [address] Tel. [phone]    Fax. [phone]

## 2016-08-30 ENCOUNTER — Other Ambulatory Visit (HOSPITAL_BASED_OUTPATIENT_CLINIC_OR_DEPARTMENT_OTHER): Payer: Medicare Other

## 2016-08-30 ENCOUNTER — Ambulatory Visit (HOSPITAL_BASED_OUTPATIENT_CLINIC_OR_DEPARTMENT_OTHER): Payer: Medicare Other | Admitting: Adult Health

## 2016-08-30 ENCOUNTER — Ambulatory Visit (HOSPITAL_BASED_OUTPATIENT_CLINIC_OR_DEPARTMENT_OTHER): Payer: Medicare Other

## 2016-08-30 VITALS — BP 125/77 | HR 80 | Temp 97.6°F | Resp 20 | Ht 65.0 in | Wt 143.9 lb

## 2016-08-30 DIAGNOSIS — N051 Unspecified nephritic syndrome with focal and segmental glomerular lesions: Secondary | ICD-10-CM

## 2016-08-30 DIAGNOSIS — Z170 Estrogen receptor positive status [ER+]: Principal | ICD-10-CM

## 2016-08-30 DIAGNOSIS — C50312 Malignant neoplasm of lower-inner quadrant of left female breast: Secondary | ICD-10-CM | POA: Diagnosis not present

## 2016-08-30 DIAGNOSIS — Z5111 Encounter for antineoplastic chemotherapy: Secondary | ICD-10-CM

## 2016-08-30 DIAGNOSIS — N185 Chronic kidney disease, stage 5: Secondary | ICD-10-CM

## 2016-08-30 LAB — COMPREHENSIVE METABOLIC PANEL
ALT: 19 U/L (ref 0–55)
AST: 16 U/L (ref 5–34)
Albumin: 4 g/dL (ref 3.5–5.0)
Alkaline Phosphatase: 85 U/L (ref 40–150)
Anion Gap: 15 mEq/L — ABNORMAL HIGH (ref 3–11)
BUN: 12.5 mg/dL (ref 7.0–26.0)
CALCIUM: 9.9 mg/dL (ref 8.4–10.4)
CHLORIDE: 107 meq/L (ref 98–109)
CO2: 25 mEq/L (ref 22–29)
CREATININE: 0.8 mg/dL (ref 0.6–1.1)
EGFR: >90 mL/min/{1.73_m2} (ref >90)
Glucose: 100 mg/dl (ref 70–140)
Potassium: 3.2 mEq/L — ABNORMAL LOW (ref 3.5–5.1)
Sodium: 146 mEq/L — ABNORMAL HIGH (ref 136–145)
Total Bilirubin: 0.41 mg/dL (ref 0.20–1.20)
Total Protein: 6.6 g/dL (ref 6.4–8.3)

## 2016-08-30 LAB — CBC WITH DIFFERENTIAL/PLATELET
BASO%: 0.2 % (ref 0.0–2.0)
BASOS ABS: 0 10*3/uL (ref 0.0–0.1)
EOS%: 1.6 % (ref 0.0–7.0)
Eosinophils Absolute: 0.1 10*3/uL (ref 0.0–0.5)
HCT: 37.1 % (ref 34.8–46.6)
HEMOGLOBIN: 12 g/dL (ref 11.6–15.9)
LYMPH%: 8.4 % — ABNORMAL LOW (ref 14.0–49.7)
MCH: 28.4 pg (ref 25.1–34.0)
MCHC: 32.4 g/dL (ref 31.5–36.0)
MCV: 87.5 fL (ref 79.5–101.0)
MONO#: 0.6 10*3/uL (ref 0.1–0.9)
MONO%: 7.5 % (ref 0.0–14.0)
NEUT#: 6.3 10*3/uL (ref 1.5–6.5)
NEUT%: 82.3 % — ABNORMAL HIGH (ref 38.4–76.8)
Platelets: 179 10*3/uL (ref 145–400)
RBC: 4.24 10*6/uL (ref 3.70–5.45)
RDW: 19.6 % — AB (ref 11.2–14.5)
WBC: 7.7 10*3/uL (ref 3.9–10.3)
lymph#: 0.6 10*3/uL — ABNORMAL LOW (ref 0.9–3.3)

## 2016-08-30 MED ORDER — HEPARIN SOD (PORK) LOCK FLUSH 100 UNIT/ML IV SOLN
500.0000 [IU] | Freq: Once | INTRAVENOUS | Status: AC | PRN
  Administered 2016-08-30: 500 [IU]
  Filled 2016-08-30: qty 5

## 2016-08-30 MED ORDER — FAMOTIDINE IN NACL 20-0.9 MG/50ML-% IV SOLN
INTRAVENOUS | Status: AC
  Filled 2016-08-30: qty 50

## 2016-08-30 MED ORDER — FAMOTIDINE IN NACL 20-0.9 MG/50ML-% IV SOLN
20.0000 mg | Freq: Once | INTRAVENOUS | Status: AC
Start: 2016-08-30 — End: 2016-08-30
  Administered 2016-08-30: 20 mg via INTRAVENOUS

## 2016-08-30 MED ORDER — SODIUM CHLORIDE 0.9 % IV SOLN
Freq: Once | INTRAVENOUS | Status: AC
  Administered 2016-08-30: 10:00:00 via INTRAVENOUS

## 2016-08-30 MED ORDER — DEXAMETHASONE SODIUM PHOSPHATE 10 MG/ML IJ SOLN
4.0000 mg | Freq: Once | INTRAMUSCULAR | Status: AC
  Administered 2016-08-30: 4 mg via INTRAVENOUS

## 2016-08-30 MED ORDER — SODIUM CHLORIDE 0.9% FLUSH
10.0000 mL | INTRAVENOUS | Status: DC | PRN
  Administered 2016-08-30: 10 mL
  Filled 2016-08-30: qty 10

## 2016-08-30 MED ORDER — DIPHENHYDRAMINE HCL 25 MG PO CAPS
ORAL_CAPSULE | ORAL | Status: AC
  Filled 2016-08-30: qty 1

## 2016-08-30 MED ORDER — DEXAMETHASONE SODIUM PHOSPHATE 10 MG/ML IJ SOLN
INTRAMUSCULAR | Status: AC
  Filled 2016-08-30: qty 1

## 2016-08-30 MED ORDER — DIPHENHYDRAMINE HCL 25 MG PO CAPS
25.0000 mg | ORAL_CAPSULE | Freq: Once | ORAL | Status: AC
  Administered 2016-08-30: 25 mg via ORAL

## 2016-08-30 MED ORDER — DEXTROSE 5 % IV SOLN
80.0000 mg/m2 | Freq: Once | INTRAVENOUS | Status: AC
  Administered 2016-08-30: 144 mg via INTRAVENOUS
  Filled 2016-08-30: qty 24

## 2016-08-30 NOTE — Patient Instructions (Signed)
[facility] Discharge Instructions for Patients Receiving Chemotherapy  Today you received the following chemotherapy agents Taxol   To help prevent nausea and vomiting after your treatment, we encourage you to take your nausea medication as directed.   If you develop nausea and vomiting that is not controlled by your nausea medication, call the clinic.   BELOW ARE SYMPTOMS THAT SHOULD BE REPORTED IMMEDIATELY:  *FEVER GREATER THAN 100.5 F  *CHILLS WITH OR WITHOUT FEVER  NAUSEA AND VOMITING THAT IS NOT CONTROLLED WITH YOUR NAUSEA MEDICATION  *UNUSUAL SHORTNESS OF BREATH  *UNUSUAL BRUISING OR BLEEDING  TENDERNESS IN MOUTH AND THROAT WITH OR WITHOUT PRESENCE OF ULCERS  *URINARY PROBLEMS  *BOWEL PROBLEMS  UNUSUAL RASH Items with * indicate a potential emergency and should be followed up as soon as possible.  Feel free to call the clinic you have any questions or concerns. The clinic phone number is [phone].  Please show the CHEMO ALERT CARD at check-in to the Emergency Department and triage nurse.   

## 2016-08-31 ENCOUNTER — Encounter: Payer: Self-pay | Admitting: Adult Health

## 2016-09-06 ENCOUNTER — Other Ambulatory Visit (HOSPITAL_BASED_OUTPATIENT_CLINIC_OR_DEPARTMENT_OTHER): Payer: Medicare Other

## 2016-09-06 ENCOUNTER — Encounter: Payer: Medicare Other | Admitting: Oncology

## 2016-09-06 ENCOUNTER — Ambulatory Visit (HOSPITAL_BASED_OUTPATIENT_CLINIC_OR_DEPARTMENT_OTHER): Payer: Medicare Other

## 2016-09-06 VITALS — BP 133/74 | HR 94 | Temp 98.6°F | Resp 20

## 2016-09-06 DIAGNOSIS — N185 Chronic kidney disease, stage 5: Secondary | ICD-10-CM

## 2016-09-06 DIAGNOSIS — Z170 Estrogen receptor positive status [ER+]: Principal | ICD-10-CM

## 2016-09-06 DIAGNOSIS — C50312 Malignant neoplasm of lower-inner quadrant of left female breast: Secondary | ICD-10-CM | POA: Diagnosis not present

## 2016-09-06 DIAGNOSIS — N051 Unspecified nephritic syndrome with focal and segmental glomerular lesions: Secondary | ICD-10-CM

## 2016-09-06 DIAGNOSIS — Z5111 Encounter for antineoplastic chemotherapy: Secondary | ICD-10-CM | POA: Diagnosis not present

## 2016-09-06 LAB — CBC WITH DIFFERENTIAL/PLATELET
BASO%: 0.5 % (ref 0.0–2.0)
Basophils Absolute: 0 10*3/uL (ref 0.0–0.1)
EOS%: 1.4 % (ref 0.0–7.0)
Eosinophils Absolute: 0.1 10*3/uL (ref 0.0–0.5)
HEMATOCRIT: 39.7 % (ref 34.8–46.6)
HEMOGLOBIN: 12.9 g/dL (ref 11.6–15.9)
LYMPH#: 0.6 10*3/uL — AB (ref 0.9–3.3)
LYMPH%: 10.8 % — ABNORMAL LOW (ref 14.0–49.7)
MCH: 28.6 pg (ref 25.1–34.0)
MCHC: 32.4 g/dL (ref 31.5–36.0)
MCV: 88.2 fL (ref 79.5–101.0)
MONO#: 0.4 10*3/uL (ref 0.1–0.9)
MONO%: 6.6 % (ref 0.0–14.0)
NEUT#: 4.5 10*3/uL (ref 1.5–6.5)
NEUT%: 80.7 % — ABNORMAL HIGH (ref 38.4–76.8)
Platelets: 210 10*3/uL (ref 145–400)
RBC: 4.51 10*6/uL (ref 3.70–5.45)
RDW: 18.4 % — AB (ref 11.2–14.5)
WBC: 5.5 10*3/uL (ref 3.9–10.3)

## 2016-09-06 LAB — COMPREHENSIVE METABOLIC PANEL
ALBUMIN: 4.4 g/dL (ref 3.5–5.0)
ALK PHOS: 78 U/L (ref 40–150)
ALT: 19 U/L (ref 0–55)
AST: 16 U/L (ref 5–34)
Anion Gap: 12 mEq/L — ABNORMAL HIGH (ref 3–11)
BUN: 11.1 mg/dL (ref 7.0–26.0)
CALCIUM: 10.4 mg/dL (ref 8.4–10.4)
CO2: 29 mEq/L (ref 22–29)
CREATININE: 0.8 mg/dL (ref 0.6–1.1)
Chloride: 105 mEq/L (ref 98–109)
EGFR: >90 mL/min/{1.73_m2} (ref >90)
Glucose: 113 mg/dl (ref 70–140)
Potassium: 3.4 mEq/L — ABNORMAL LOW (ref 3.5–5.1)
Sodium: 146 mEq/L — ABNORMAL HIGH (ref 136–145)
TOTAL PROTEIN: 7.2 g/dL (ref 6.4–8.3)
Total Bilirubin: 0.47 mg/dL (ref 0.20–1.20)

## 2016-09-06 MED ORDER — HEPARIN SOD (PORK) LOCK FLUSH 100 UNIT/ML IV SOLN
500.0000 [IU] | Freq: Once | INTRAVENOUS | Status: AC | PRN
  Administered 2016-09-06: 500 [IU]
  Filled 2016-09-06: qty 5

## 2016-09-06 MED ORDER — DEXAMETHASONE SODIUM PHOSPHATE 10 MG/ML IJ SOLN
4.0000 mg | Freq: Once | INTRAMUSCULAR | Status: AC
  Administered 2016-09-06: 4 mg via INTRAVENOUS

## 2016-09-06 MED ORDER — FAMOTIDINE IN NACL 20-0.9 MG/50ML-% IV SOLN
INTRAVENOUS | Status: AC
Start: 2016-09-06 — End: 2016-09-06
  Filled 2016-09-06: qty 50

## 2016-09-06 MED ORDER — SODIUM CHLORIDE 0.9 % IV SOLN
Freq: Once | INTRAVENOUS | Status: AC
  Administered 2016-09-06: 11:00:00 via INTRAVENOUS

## 2016-09-06 MED ORDER — SODIUM CHLORIDE 0.9% FLUSH
10.0000 mL | INTRAVENOUS | Status: DC | PRN
  Administered 2016-09-06: 10 mL
  Filled 2016-09-06: qty 10

## 2016-09-06 MED ORDER — FAMOTIDINE IN NACL 20-0.9 MG/50ML-% IV SOLN
20.0000 mg | Freq: Once | INTRAVENOUS | Status: AC
  Administered 2016-09-06: 20 mg via INTRAVENOUS

## 2016-09-06 MED ORDER — DIPHENHYDRAMINE HCL 50 MG/ML IJ SOLN
INTRAMUSCULAR | Status: AC
Start: 2016-09-06 — End: 2016-09-06
  Filled 2016-09-06: qty 1

## 2016-09-06 MED ORDER — PACLITAXEL CHEMO INJECTION 300 MG/50ML
80.0000 mg/m2 | Freq: Once | INTRAVENOUS | Status: AC
  Administered 2016-09-06: 144 mg via INTRAVENOUS
  Filled 2016-09-06: qty 24

## 2016-09-06 MED ORDER — DIPHENHYDRAMINE HCL 25 MG PO CAPS
25.0000 mg | ORAL_CAPSULE | Freq: Once | ORAL | Status: AC
  Administered 2016-09-06: 25 mg via ORAL

## 2016-09-06 MED ORDER — DIPHENHYDRAMINE HCL 25 MG PO CAPS
ORAL_CAPSULE | ORAL | Status: AC
  Filled 2016-09-06: qty 1

## 2016-09-06 MED ORDER — DEXAMETHASONE SODIUM PHOSPHATE 10 MG/ML IJ SOLN
INTRAMUSCULAR | Status: AC
  Filled 2016-09-06: qty 1

## 2016-09-06 NOTE — Progress Notes (Signed)
[place]  [phone] [phone]     ID: Erika Freeman DOB: May 29, 1960  MR#: [id number]  TFT#:[id number]  Patient Care Team: [name], MD as PCP - General (Obstetrics and Gynecology) [name], MD as Consulting Physician (Nephrology) [name], MD as Consulting Physician (Obstetrics and Gynecology) [name], MD as Consulting Physician (General Surgery) Zerrick Hanssen, [name], MD as Consulting Physician (Oncology) [name], MD as Consulting Physician (Radiation Oncology) [name], MD as Referring Physician (Surgery) [name], DO (Internal Medicine) [name], MD OTHER MD:  CHIEF COMPLAINT: Estrogen receptor positive breast cancer  CURRENT TREATMENT: Neoadjuvant chemotherapy  INTERVAL HISTORY: Erika Freeman is here today for week 6 of neoadjuvant Paclitaxel.  She continues to tolerate the treatment well.  She is feeling more energized and has not developed any numbness or tingling in her fingertips.  She can no longer feel her breast mass.  She is continuing to take Questran for some diarrhea related to the chemotherapy.   REVIEW OF SYSTEMS: Erika Freeman is starting to develop bruising on her thumb nails that she noticed this week.  She says her nails feel sore towards the nail bed.  She denies any fevers, chills, mucositis, rash, or any further concerns.  A detailed ROS was conducted and is otherwise negative.   BREAST CANCER HISTORY: From the original intake note:  "Erika Freeman" herself noted a mass in her left breast. She ignored it because she thought it was related to menstruation. However as it persisted through 1. She brought it to Dr. [name] attention and on 05/10/2016 she underwent left diagnostic mammography with tomography and [facility] The breast density was category C. In the left breast lower central area there was a new circumscribed mass. This was firm  and fixed in the lower inner left breast at middle depth. Ultrasonography confirmed a 2.6 cm hypoechoic irregular mass at the 6:30 o'clock radiant 3 cm from the nipple. There was also a 0.5 cm nodule 1.4 cm medial to the mass just described.  Biopsy of the left breast mass in question 05/10/2016 showed([id number]) invasive ductal carcinoma, grade 3, estrogen receptor 70% positive with weak staining intensity, progesterone receptor negative, with no HER-2 amplification, the signals ratio being 1.52 and the number per cell 2.35. The proliferation marker was 90%.  Her subsequent history is as detailed below  OF NOTE: The patient has a history of focal segmental glomerular sclerosis with end-stage renal disease and is status post unrelated donor renal transplant 04/15/2013 as part of appeared donor exchange with her husband donating one kidney, the other harvested from a 55 year old [place] man. She continues on immunosuppression with mycophenolate and tacrolimus.  PAST MEDICAL HISTORY: Past Medical History:  Diagnosis Date  . Anemia   . Cancer [facility])    breast cancer  . Chills   . Chronic kidney disease    resolved kidney transplant   . Cough   . Fibroid tumor   . GERD (gastroesophageal reflux disease)   . Heart murmur   . Hypertension    DX  30 YRS AGO  . Umbilical hernia s/p primary repair 01/10/2012 01/24/2012  . Vomiting     PAST SURGICAL HISTORY: Past Surgical History:  Procedure Laterality Date  . BREAST SURGERY  2012   left - fibroadenoma  . CAPD INSERTION  01/10/2012   Procedure: LAPAROSCOPIC INSERTION CONTINUOUS AMBULATORY PERITONEAL DIALYSIS  (CAPD) CATHETER;  Surgeon: Adin Hector, MD;  Location: [place];  Service: General;  Laterality: N/A;  LAPAROSCOPIC CAPD CATHETER PLACEMENT OMENTOPEXY  . CESAREAN SECTION  1996  . DILATION AND CURETTAGE OF UTERUS    . KIDNEY TRANSPLANT    . PARATHYROIDECTOMY  2000 - approximate  . PORTACATH PLACEMENT Right 05/24/2016   Procedure:  INSERTION PORT-A-CATH WITH [place];  Surgeon: [name], MD;  Location: [place];  Service: General;  Laterality: Right;  . RENAL BIOPSY  2011  . UMBILICAL HERNIA REPAIR  01/10/2012   Procedure: HERNIA REPAIR UMBILICAL ADULT;  Surgeon: Adin Hector, MD;  Location: [place];  Service: General;  Laterality: N/A;    FAMILY HISTORY Family History  Problem Relation Age of Onset  . Cancer Maternal Uncle        lung  . Cancer Cousin        breast  . Cancer Cousin        breast  The patient has very little information regarding her father. Her mother is living, 27 years old as of March 2018. The patient had one full brother, diagnosed with prostate cancer in his 29s. The patient has 2 half-brothers and 1 half-sister. There is no history of breast or ovarian cancer in the family to the patient's knowledge   GYNECOLOGIC HISTORY:  No LMP recorded.  Menarche age 70, first live birth age 77, the patient is GX P3. Her periods are regular, the last 5 or 6 days, with no heavy days. 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 Her husband white works at home [employer]. Son [name] lives in [place] and as Freight forwarder of a Therapist, art. Son [name] lives in [place] and is a Air traffic controller. Son [name] lives in [place] and is studying based trombone. The patient has 2 grandchildren. She attends a local [employer]:  not in place    HEALTH MAINTENANCE: Social History  Substance Use Topics  . Smoking status: Never Smoker  . Smokeless tobacco: Never Used  . Alcohol use No     Colonoscopy:  PAP:  Bone density:   Allergies  Allergen Reactions  . No Known Allergies     Current Outpatient Prescriptions  Medication Sig Dispense Refill  . aspirin 81 MG tablet Take 81 mg by mouth every morning.     . cholestyramine (QUESTRAN) 4 g packet Take  1 packet (4 g total) by mouth 3 (three) times daily with meals. (Patient taking differently: Take 4 g by mouth 4 (four) times daily as needed. ) 60 each 12  . diphenhydrAMINE (BENADRYL) 25 MG tablet Take 25 mg by mouth daily as needed for allergies.    . fluconazole (DIFLUCAN) 100 MG tablet Take 100 mg by mouth daily.  0  . Iron, Ferrous Sulfate, 142 (45 Fe) MG TBCR Take 1 tablet by mouth every morning.     . lidocaine-prilocaine (EMLA) cream Apply to affected area once 30 g 3  . loratadine (CLARITIN) 10 MG tablet Take 1 tablet (10 mg total) by mouth daily. 100 tablet 0  . LORazepam (ATIVAN) 0.5 MG tablet Take 1 tablet (0.5 mg total) by mouth at bedtime. (Patient not taking: Reported on 08/09/2016) 30 tablet 0  . lovastatin (MEVACOR) 20 MG tablet Take 20 mg by mouth every Monday, Wednesday, and Friday.     . nystatin cream (MYCOSTATIN) Apply 1 application topically 2 (two) times daily. 30 g 0  .  potassium chloride (MICRO-K) 10 MEQ CR capsule TAKE ONE CAPSULE BY MOUTH DAILY (Patient not taking: Reported on 08/09/2016) 90 capsule 3  . predniSONE (DELTASONE) 5 MG tablet Take 5 mg by mouth every morning.     . prochlorperazine (COMPAZINE) 10 MG tablet TAKE 1 TABLET BY MOUTH EVERY 6 HOURS AS NEEDED FOR NAUSEA OR VOMITING (Patient taking differently: TAKE 10 MG BY MOUTH EVERY 6 HOURS AS NEEDED FOR NAUSEA OR VOMITING) 385 tablet 1  . SENSIPAR 60 MG tablet Take 60 mg by mouth 3 (three) times a week.     . tacrolimus (PROGRAF) 1 MG capsule Take 3 mg by mouth 2 (two) times daily.     No current facility-administered medications for this visit.    Facility-Administered Medications Ordered in Other Visits  Medication Dose Route Frequency Provider Last Rate Last Dose  . famotidine (PEPCID) IVPB 20 mg premix  20 mg Intravenous Once Raynald Rouillard, [name], MD   20 mg at 09/06/16 1118  . heparin lock flush 100 unit/mL  500 Units Intracatheter Once PRN Nozomi Mettler, [name], MD      . PACLitaxel (TAXOL) 144 mg in  dextrose 5 % 250 mL chemo infusion (</= 55m/m2)  80 mg/m2 (Treatment Plan Recorded) Intravenous Once Daizha Anand, GVirgie Dad MD      . sodium chloride flush (NS) 0.9 % injection 10 mL  10 mL Intracatheter PRN Fruma Africa, GVirgie Dad MD        OBJECTIVE:   Vitals:   09/06/16 1110  BP: 133/74  Pulse: 94  Resp: 18  Temp: 98.6 F (37 C)     There is no height or weight on file to calculate BMI.  GENERAL: Patient is a well appearing female in no acute distress HEENT:  Sclerae anicteric.  Oropharynx clear and moist. No ulcerations or evidence of oropharyngeal candidiasis. Neck is supple.  NODES:  No cervical, supraclavicular, or axillary lymphadenopathy palpated.  BREAST EXAM:  Unable to palpate left breast mass. LUNGS:  Clear to auscultation bilaterally.  No wheezes or rhonchi. HEART:  Regular rate and rhythm. No murmur appreciated. ABDOMEN:  Soft, nontender.  Positive, normoactive bowel sounds. No organomegaly palpated. MSK:  No focal spinal tenderness to palpation. Full range of motion bilaterally in the upper extremities. EXTREMITIES:  No peripheral edema.   SKIN:  Clear with no obvious rashes or skin changes. First digit nails with bruising near the nail bed, nails are intact.   NEURO:  Nonfocal. Well oriented.  Appropriate affect.       ECOG FS:1 - Symptomatic but completely ambulatory      LAB RESULTS:  CMP     Component Value Date/Time   NA 146 (H) 09/06/2016 0928   K 3.4 (L) 09/06/2016 0928   CL 106 07/15/2016 2008   CO2 29 09/06/2016 0928   GLUCOSE 113 09/06/2016 0928   BUN 11.1 09/06/2016 0928   CREATININE 0.8 09/06/2016 0928   CALCIUM 10.4 09/06/2016 0928   PROT 7.2 09/06/2016 0928   ALBUMIN 4.4 09/06/2016 0928   AST 16 09/06/2016 0928   ALT 19 09/06/2016 0928   ALKPHOS 78 09/06/2016 0928   BILITOT 0.47 09/06/2016 0928   GFRNONAA >60 07/15/2016 2008   GFRAA >60 07/15/2016 2008    No results found for: TOTALPROTELP, ALBUMINELP, A1GS, A2GS, BETS, BETA2SER, GAMS,  MSPIKE, SPEI  No results found for: KPAFRELGTCHN, LAMBDASER, KAPLAMBRATIO  Lab Results  Component Value Date   WBC 5.5 09/06/2016   NEUTROABS 4.5 09/06/2016   HGB 12.9  09/06/2016   HCT 39.7 09/06/2016   MCV 88.2 09/06/2016   PLT 210 09/06/2016      Chemistry      Component Value Date/Time   NA 146 (H) 09/06/2016 0928   K 3.4 (L) 09/06/2016 0928   CL 106 07/15/2016 2008   CO2 29 09/06/2016 0928   BUN 11.1 09/06/2016 0928   CREATININE 0.8 09/06/2016 0928      Component Value Date/Time   CALCIUM 10.4 09/06/2016 0928   ALKPHOS 78 09/06/2016 0928   AST 16 09/06/2016 0928   ALT 19 09/06/2016 0928   BILITOT 0.47 09/06/2016 0928       No results found for: LABCA2  No components found for: [id number]  No results for input(s): INR in the last 168 hours.  Urinalysis    Component Value Date/Time   COLORURINE YELLOW 11/24/2006 1539   APPEARANCEUR CLEAR 11/24/2006 1539   LABSPEC 1.018 11/24/2006 1539   PHURINE 6.0 11/24/2006 1539   GLUCOSEU NEGATIVE 11/24/2006 1539   HGBUR SMALL (A) 11/24/2006 1539   BILIRUBINUR NEGATIVE 11/24/2006 1539   KETONESUR NEGATIVE 11/24/2006 1539   PROTEINUR 100 (A) 11/24/2006 1539   UROBILINOGEN 1.0 11/24/2006 1539   NITRITE NEGATIVE 11/24/2006 1539   LEUKOCYTESUR MODERATE (A) 11/24/2006 1539     STUDIES: No results found.  ELIGIBLE FOR AVAILABLE RESEARCH PROTOCOL: no  ASSESSMENT: 56 y.o. [place], [place] woman status post left breast lower inner quadrant biopsy 05/10/2016 for a clinical  T2 N0, prognostic stage IIB invaside ductal carcinoma, grade 3, weekly estrogen receptor positive, progesterone receptor negative, with no HER-2 amplification, and the MIB-1 at 90%  (a) biopsy of the 0.6 mm satellite at the 6:30 o'clock radiant in the left breast 06/02/2016 showed invasive ductal carcinoma, grade 3, estrogen receptor 20% positive, with weak staining intensity, progesterone receptor negative with no HER-2 amplification, and an MIB-1 of 70%  (identical to larger mass)   (1) neoadjuvant chemotherapy consisting of doxorubicin and cyclophosphamide in dose dense fashion 4 started 06/01/2016, completed 07/12/2016  followed by Paclitaxel weekly 12 started 07/26/2016  (2) definitive surgery to follow, with breast conservation preferred if feasible   (a) satellite lesion noted on [place] to be included in final surgery sample  (3) adjuvant radiation as appropriate  (4) anti-estrogens to follow the completion of local treatment  PLAN: Erika Freeman is doing well today.  I reviewed her labs with her and they were normal.  She will proceed with her sixth cycle of Paclitaxel.  She and I reviewed that she is developing a nail dyscrasia.  We reviewed the use of cuticle creams, wearing gloves while doing the dishes, and cutting her nails shorter.    The above plan was reviewed with the patient in detail.  She is in agreement with it.  She knows to call for any questions or concerns prior to her next appointment here.    A total of (30) minutes of face-to-face time was spent with this patient with greater than 50% of that time in counseling and care-coordination.   [name], MD   09/06/2016 11:21 AM Medical Oncology and Hematology [facility] [address] [place], [place] [address] Tel. [phone]    Fax. [phone] This encounter was created in error - please disregard.

## 2016-09-06 NOTE — Patient Instructions (Signed)
[facility] Discharge Instructions for Patients Receiving Chemotherapy  Today you received the following chemotherapy agents Taxol  To help prevent nausea and vomiting after your treatment, we encourage you to take your nausea medication   If you develop nausea and vomiting that is not controlled by your nausea medication, call the clinic.   BELOW ARE SYMPTOMS THAT SHOULD BE REPORTED IMMEDIATELY:  *FEVER GREATER THAN 100.5 F  *CHILLS WITH OR WITHOUT FEVER  NAUSEA AND VOMITING THAT IS NOT CONTROLLED WITH YOUR NAUSEA MEDICATION  *UNUSUAL SHORTNESS OF BREATH  *UNUSUAL BRUISING OR BLEEDING  TENDERNESS IN MOUTH AND THROAT WITH OR WITHOUT PRESENCE OF ULCERS  *URINARY PROBLEMS  *BOWEL PROBLEMS  UNUSUAL RASH Items with * indicate a potential emergency and should be followed up as soon as possible.  Feel free to call the clinic you have any questions or concerns. The clinic phone number is [phone].  Please show the CHEMO ALERT CARD at check-in to the Emergency Department and triage nurse.   

## 2016-09-13 ENCOUNTER — Ambulatory Visit (HOSPITAL_BASED_OUTPATIENT_CLINIC_OR_DEPARTMENT_OTHER): Payer: Medicare Other

## 2016-09-13 ENCOUNTER — Other Ambulatory Visit: Payer: Self-pay | Admitting: Oncology

## 2016-09-13 ENCOUNTER — Other Ambulatory Visit (HOSPITAL_BASED_OUTPATIENT_CLINIC_OR_DEPARTMENT_OTHER): Payer: Medicare Other

## 2016-09-13 VITALS — BP 126/85 | HR 85 | Temp 98.9°F | Resp 18

## 2016-09-13 DIAGNOSIS — C50312 Malignant neoplasm of lower-inner quadrant of left female breast: Secondary | ICD-10-CM

## 2016-09-13 DIAGNOSIS — N051 Unspecified nephritic syndrome with focal and segmental glomerular lesions: Secondary | ICD-10-CM

## 2016-09-13 DIAGNOSIS — Z170 Estrogen receptor positive status [ER+]: Principal | ICD-10-CM

## 2016-09-13 DIAGNOSIS — Z5111 Encounter for antineoplastic chemotherapy: Secondary | ICD-10-CM | POA: Diagnosis not present

## 2016-09-13 DIAGNOSIS — N185 Chronic kidney disease, stage 5: Secondary | ICD-10-CM

## 2016-09-13 LAB — CBC WITH DIFFERENTIAL/PLATELET
BASO%: 0.3 % (ref 0.0–2.0)
BASOS ABS: 0 10*3/uL (ref 0.0–0.1)
EOS ABS: 0.1 10*3/uL (ref 0.0–0.5)
EOS%: 1.9 % (ref 0.0–7.0)
HCT: 38.9 % (ref 34.8–46.6)
HEMOGLOBIN: 12.2 g/dL (ref 11.6–15.9)
LYMPH%: 18.8 % (ref 14.0–49.7)
MCH: 28.6 pg (ref 25.1–34.0)
MCHC: 31.4 g/dL — ABNORMAL LOW (ref 31.5–36.0)
MCV: 91.3 fL (ref 79.5–101.0)
MONO#: 0.3 10*3/uL (ref 0.1–0.9)
MONO%: 8.4 % (ref 0.0–14.0)
NEUT%: 70.6 % (ref 38.4–76.8)
NEUTROS ABS: 2.6 10*3/uL (ref 1.5–6.5)
PLATELETS: 207 10*3/uL (ref 145–400)
RBC: 4.26 10*6/uL (ref 3.70–5.45)
RDW: 16.2 % — AB (ref 11.2–14.5)
WBC: 3.7 10*3/uL — AB (ref 3.9–10.3)
lymph#: 0.7 10*3/uL — ABNORMAL LOW (ref 0.9–3.3)

## 2016-09-13 LAB — COMPREHENSIVE METABOLIC PANEL
ALBUMIN: 4.1 g/dL (ref 3.5–5.0)
ALK PHOS: 79 U/L (ref 40–150)
ALT: 18 U/L (ref 0–55)
ANION GAP: 11 meq/L (ref 3–11)
AST: 14 U/L (ref 5–34)
BILIRUBIN TOTAL: 0.49 mg/dL (ref 0.20–1.20)
BUN: 13.7 mg/dL (ref 7.0–26.0)
CO2: 26 mEq/L (ref 22–29)
Calcium: 10.2 mg/dL (ref 8.4–10.4)
Chloride: 106 mEq/L (ref 98–109)
Creatinine: 0.9 mg/dL (ref 0.6–1.1)
EGFR: 84 mL/min/{1.73_m2} — AB (ref >90)
Glucose: 118 mg/dl (ref 70–140)
POTASSIUM: 3.2 meq/L — AB (ref 3.5–5.1)
SODIUM: 143 meq/L (ref 136–145)
TOTAL PROTEIN: 6.7 g/dL (ref 6.4–8.3)

## 2016-09-13 MED ORDER — HEPARIN SOD (PORK) LOCK FLUSH 100 UNIT/ML IV SOLN
500.0000 [IU] | Freq: Once | INTRAVENOUS | Status: AC | PRN
  Administered 2016-09-13: 500 [IU]
  Filled 2016-09-13: qty 5

## 2016-09-13 MED ORDER — FAMOTIDINE IN NACL 20-0.9 MG/50ML-% IV SOLN
20.0000 mg | Freq: Once | INTRAVENOUS | Status: AC
  Administered 2016-09-13: 20 mg via INTRAVENOUS

## 2016-09-13 MED ORDER — DIPHENHYDRAMINE HCL 25 MG PO CAPS
25.0000 mg | ORAL_CAPSULE | Freq: Once | ORAL | Status: AC
  Administered 2016-09-13: 25 mg via ORAL

## 2016-09-13 MED ORDER — FAMOTIDINE IN NACL 20-0.9 MG/50ML-% IV SOLN
INTRAVENOUS | Status: AC
  Filled 2016-09-13: qty 50

## 2016-09-13 MED ORDER — SODIUM CHLORIDE 0.9 % IV SOLN
Freq: Once | INTRAVENOUS | Status: AC
  Administered 2016-09-13: 10:00:00 via INTRAVENOUS

## 2016-09-13 MED ORDER — DEXAMETHASONE SODIUM PHOSPHATE 10 MG/ML IJ SOLN
INTRAMUSCULAR | Status: AC
  Filled 2016-09-13: qty 1

## 2016-09-13 MED ORDER — DEXTROSE 5 % IV SOLN
80.0000 mg/m2 | Freq: Once | INTRAVENOUS | Status: AC
  Administered 2016-09-13: 144 mg via INTRAVENOUS
  Filled 2016-09-13: qty 24

## 2016-09-13 MED ORDER — SODIUM CHLORIDE 0.9% FLUSH
10.0000 mL | INTRAVENOUS | Status: DC | PRN
  Administered 2016-09-13: 10 mL
  Filled 2016-09-13: qty 10

## 2016-09-13 MED ORDER — DEXAMETHASONE SODIUM PHOSPHATE 10 MG/ML IJ SOLN
4.0000 mg | Freq: Once | INTRAMUSCULAR | Status: AC
  Administered 2016-09-13: 4 mg via INTRAVENOUS

## 2016-09-13 MED ORDER — DIPHENHYDRAMINE HCL 25 MG PO CAPS
ORAL_CAPSULE | ORAL | Status: AC
  Filled 2016-09-13: qty 1

## 2016-09-13 NOTE — Progress Notes (Signed)
Dr. Jana Hakim aware of potassium 3.2, no new orders at this time, okay to proceed with treatment.

## 2016-09-13 NOTE — Patient Instructions (Signed)
[facility] Discharge Instructions for Patients Receiving Chemotherapy  Today you received the following chemotherapy agents Taxol   To help prevent nausea and vomiting after your treatment, we encourage you to take your nausea medication as directed.   If you develop nausea and vomiting that is not controlled by your nausea medication, call the clinic.   BELOW ARE SYMPTOMS THAT SHOULD BE REPORTED IMMEDIATELY:  *FEVER GREATER THAN 100.5 F  *CHILLS WITH OR WITHOUT FEVER  NAUSEA AND VOMITING THAT IS NOT CONTROLLED WITH YOUR NAUSEA MEDICATION  *UNUSUAL SHORTNESS OF BREATH  *UNUSUAL BRUISING OR BLEEDING  TENDERNESS IN MOUTH AND THROAT WITH OR WITHOUT PRESENCE OF ULCERS  *URINARY PROBLEMS  *BOWEL PROBLEMS  UNUSUAL RASH Items with * indicate a potential emergency and should be followed up as soon as possible.  Feel free to call the clinic you have any questions or concerns. The clinic phone number is [phone].  Please show the CHEMO ALERT CARD at check-in to the Emergency Department and triage nurse.   

## 2016-09-19 NOTE — Progress Notes (Signed)
[place]  [phone] [phone]     ID: Erika Freeman DOB: 03/02/1960  MR#: [id number]  ENI#:[id number]  Patient Care Team: [name], MD as PCP - General (Obstetrics and Gynecology) [name], MD as Consulting Physician (Nephrology) [name], MD as Consulting Physician (Obstetrics and Gynecology) [name], MD as Consulting Physician (General Surgery) Kiara Mcdowell, [name], MD as Consulting Physician (Oncology) [name], MD as Consulting Physician (Radiation Oncology) [name], MD as Referring Physician (Surgery) [name], DO (Internal Medicine) [name], MD OTHER MD:  CHIEF COMPLAINT: Estrogen receptor positive breast cancer  CURRENT TREATMENT: Neoadjuvant chemotherapy  INTERVAL HISTORY: Erika Freeman returns today for follow-up and treatment of her estrogen receptor positive breast cancer, accompanied by her husband. Today is day 1 cycle 9 of 12 planned weekly cycles of paclitaxel.   REVIEW OF SYSTEMS: Erika Freeman i continues to do remarkably well with her treatment and specifically denies any symptoms of peripheral neuropathy. She is having mild nail dyscrasias. She spent a week hanging wallpaper at home cooking and doing other activities. A detailed review of systems today was otherwise stable.  BREAST CANCER HISTORY: From the original intake note:  "Erika Freeman" herself noted a mass in her left breast. She ignored it because she thought it was related to menstruation. However as it persisted through 1. She brought it to Dr. [name] attention and on 05/10/2016 she underwent left diagnostic mammography with tomography and [facility] The breast density was category C. In the left breast lower central area there was a new circumscribed mass. This was firm and fixed in the lower inner left breast at middle depth. Ultrasonography confirmed a 2.6 cm hypoechoic  irregular mass at the 6:30 o'clock radiant 3 cm from the nipple. There was also a 0.5 cm nodule 1.4 cm medial to the mass just described.  Biopsy of the left breast mass in question 05/10/2016 showed([id number]) invasive ductal carcinoma, grade 3, estrogen receptor 70% positive with weak staining intensity, progesterone receptor negative, with no HER-2 amplification, the signals ratio being 1.52 and the number per cell 2.35. The proliferation marker was 90%.  Her subsequent history is as detailed below  OF NOTE: The patient has a history of focal segmental glomerular sclerosis with end-stage renal disease and is status post unrelated donor renal transplant 04/15/2013 as part of appeared donor exchange with her husband donating one kidney, the other harvested from a 55 year old [place] man. She continues on immunosuppression with mycophenolate and tacrolimus.  PAST MEDICAL HISTORY: Past Medical History:  Diagnosis Date  . Anemia   . Cancer [facility])    breast cancer  . Chills   . Chronic kidney disease    resolved kidney transplant   . Cough   . Fibroid tumor   . GERD (gastroesophageal reflux disease)   . Heart murmur   . Hypertension    DX  30 YRS AGO  . Umbilical hernia s/p primary repair 01/10/2012 01/24/2012  . Vomiting     PAST SURGICAL HISTORY: Past Surgical History:  Procedure Laterality Date  . BREAST SURGERY  2012   left - fibroadenoma  . CAPD INSERTION  01/10/2012   Procedure: LAPAROSCOPIC INSERTION CONTINUOUS AMBULATORY PERITONEAL DIALYSIS  (CAPD) CATHETER;  Surgeon: Adin Hector, MD;  Location: [place];  Service: General;  Laterality: N/A;  LAPAROSCOPIC CAPD CATHETER PLACEMENT OMENTOPEXY  . CESAREAN SECTION  1996  . DILATION AND CURETTAGE OF UTERUS    . KIDNEY  TRANSPLANT    . PARATHYROIDECTOMY  2000 - approximate  . PORTACATH PLACEMENT Right 05/24/2016   Procedure: INSERTION PORT-A-CATH WITH [place];  Surgeon: [name], MD;  Location: [place];  Service: General;   Laterality: Right;  . RENAL BIOPSY  2011  . UMBILICAL HERNIA REPAIR  01/10/2012   Procedure: HERNIA REPAIR UMBILICAL ADULT;  Surgeon: Adin Hector, MD;  Location: [place];  Service: General;  Laterality: N/A;    FAMILY HISTORY Family History  Problem Relation Age of Onset  . Cancer Maternal Uncle        lung  . Cancer Cousin        breast  . Cancer Cousin        breast  The patient has very little information regarding her father. Her mother is living, 57 years old as of March 2018. The patient had one full brother, diagnosed with prostate cancer in his 26s. The patient has 2 half-brothers and 1 half-sister. There is no history of breast or ovarian cancer in the family to the patient's knowledge   GYNECOLOGIC HISTORY:  No LMP recorded.  Menarche age 40, first live birth age 49, the patient is GX P3. Her periods are regular, the last 5 or 6 days, with no heavy days. 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 Her husband white works at home [employer]. Son [name] lives in [place] and as Freight forwarder of a Therapist, art. Son [name] lives in [place] and is a Air traffic controller. Son [name] lives in [place] and is studying based trombone. The patient has 2 grandchildren. She attends a local [employer]:  not in place    HEALTH MAINTENANCE: Social History  Substance Use Topics  . Smoking status: Never Smoker  . Smokeless tobacco: Never Used  . Alcohol use No     Colonoscopy:  PAP:  Bone density:   Allergies  Allergen Reactions  . No Known Allergies     Current Outpatient Prescriptions  Medication Sig Dispense Refill  . aspirin 81 MG tablet Take 81 mg by mouth every morning.     . diphenhydrAMINE (BENADRYL) 25 MG tablet Take 25 mg by mouth daily as needed for allergies.    . Iron, Ferrous Sulfate, 142 (45 Fe) MG TBCR Take  1 tablet by mouth every morning.     . lidocaine-prilocaine (EMLA) cream Apply to affected area once 30 g 3  . loratadine (CLARITIN) 10 MG tablet Take 1 tablet (10 mg total) by mouth daily. 100 tablet 0  . lovastatin (MEVACOR) 20 MG tablet Take 20 mg by mouth every Monday, Wednesday, and Friday.     . potassium chloride (MICRO-K) 10 MEQ CR capsule TAKE ONE CAPSULE BY MOUTH DAILY (Patient not taking: Reported on 08/09/2016) 90 capsule 3  . predniSONE (DELTASONE) 5 MG tablet Take 5 mg by mouth every morning.     . prochlorperazine (COMPAZINE) 10 MG tablet TAKE 1 TABLET BY MOUTH EVERY 6 HOURS AS NEEDED FOR NAUSEA OR VOMITING (Patient taking differently: TAKE 10 MG BY MOUTH EVERY 6 HOURS AS NEEDED FOR NAUSEA OR VOMITING) 385 tablet 1  . SENSIPAR 60 MG tablet Take 60 mg by mouth 3 (three) times a week.     . tacrolimus (PROGRAF) 1 MG capsule Take 3 mg by mouth 2 (two) times daily.     No current facility-administered medications  for this visit.     OBJECTIVE: Middle-aged African-American woman in no acute distress  Vitals:   09/20/16 0953  BP: 130/84  Pulse: 84  Resp: 18  Temp: 98.5 F (36.9 C)     Body mass index is 24.4 kg/m.   Sclerae unicteric, EOMs intact Oropharynx clear and moist No cervical or supraclavicular adenopathy Lungs no rales or rhonchi Heart regular rate and rhythm Abd soft, nontender, positive bowel sounds MSK no focal spinal tenderness, no upper extremity lymphedema Neuro: nonfocal, well oriented, appropriate affect Breasts: Deferred        ECOG FS:1 - Symptomatic but completely ambulatory      LAB RESULTS:  CMP     Component Value Date/Time   NA 144 09/20/2016 0919   K 3.3 (L) 09/20/2016 0919   CL 106 07/15/2016 2008   CO2 27 09/20/2016 0919   GLUCOSE 94 09/20/2016 0919   BUN 8.9 09/20/2016 0919   CREATININE 0.8 09/20/2016 0919   CALCIUM 10.2 09/20/2016 0919   PROT 6.6 09/20/2016 0919   ALBUMIN 4.1 09/20/2016 0919   AST 16 09/20/2016 0919    ALT 19 09/20/2016 0919   ALKPHOS 72 09/20/2016 0919   BILITOT 0.51 09/20/2016 0919   GFRNONAA >60 07/15/2016 2008   GFRAA >60 07/15/2016 2008    No results found for: TOTALPROTELP, ALBUMINELP, A1GS, A2GS, BETS, BETA2SER, GAMS, MSPIKE, SPEI  No results found for: Nils Pyle, [facility]  Lab Results  Component Value Date   WBC 2.5 (L) 09/20/2016   NEUTROABS 1.5 09/20/2016   HGB 12.2 09/20/2016   HCT 38.7 09/20/2016   MCV 91.3 09/20/2016   PLT 201 09/20/2016      Chemistry      Component Value Date/Time   NA 144 09/20/2016 0919   K 3.3 (L) 09/20/2016 0919   CL 106 07/15/2016 2008   CO2 27 09/20/2016 0919   BUN 8.9 09/20/2016 0919   CREATININE 0.8 09/20/2016 0919      Component Value Date/Time   CALCIUM 10.2 09/20/2016 0919   ALKPHOS 72 09/20/2016 0919   AST 16 09/20/2016 0919   ALT 19 09/20/2016 0919   BILITOT 0.51 09/20/2016 0919       No results found for: LABCA2  No components found for: [id number]  No results for input(s): INR in the last 168 hours.  Urinalysis    Component Value Date/Time   COLORURINE YELLOW 11/24/2006 1539   APPEARANCEUR CLEAR 11/24/2006 1539   LABSPEC 1.018 11/24/2006 1539   PHURINE 6.0 11/24/2006 1539   GLUCOSEU NEGATIVE 11/24/2006 1539   HGBUR SMALL (A) 11/24/2006 1539   BILIRUBINUR NEGATIVE 11/24/2006 1539   KETONESUR NEGATIVE 11/24/2006 1539   PROTEINUR 100 (A) 11/24/2006 1539   UROBILINOGEN 1.0 11/24/2006 1539   NITRITE NEGATIVE 11/24/2006 1539   LEUKOCYTESUR MODERATE (A) 11/24/2006 1539     STUDIES: No results found.  ELIGIBLE FOR AVAILABLE RESEARCH PROTOCOL: no  ASSESSMENT: 56 y.o. [place] woman status post left breast lower inner quadrant biopsy 05/10/2016 for a clinical  T2 N0, prognostic stage IIB invaside ductal carcinoma, grade 3, weekly estrogen receptor positive, progesterone receptor negative, with no HER-2 amplification, and the MIB-1 at 90%  (a) biopsy of the 0.6 mm satellite at the 6:30  o'clock radiant in the left breast 06/02/2016 showed invasive ductal carcinoma, grade 3, estrogen receptor 20% positive, with weak staining intensity, progesterone receptor negative with no HER-2 amplification, and an MIB-1 of 70% (identical to larger mass)   (1) neoadjuvant chemotherapy consisting  of doxorubicin and cyclophosphamide in dose dense fashion 4 started 06/01/2016, completed 07/12/2016  followed by Paclitaxel weekly 12 started 07/26/2016  (2) definitive surgery to follow, with breast conservation preferred if feasible   (a) satellite lesion noted on [place] to be included in final surgery sample  (3) adjuvant radiation as appropriate  (4) anti-estrogens to follow the completion of local treatment  PLAN: Erika Freeman continues to tolerate her chemotherapy remarkably well and she is proceeding to her ninth of 12 planned treatments today.  I have gone ahead and intercurrent repeat appointments here in 2 weeks and then with her final dose, and also for an MRI of the breast 10/18/2016. I have alerted her surgeon, Dr. Donne Hazel, so he can review that shortly thereafter and then plan the definitive surgery.  She knows to call for any problems that may develop before the next visit.    [name], MD   09/20/2016 9:52 PM Medical Oncology and Hematology [facility] [address] [place], [place] [address] Tel. [phone]    Fax. [phone]

## 2016-09-20 ENCOUNTER — Ambulatory Visit (HOSPITAL_BASED_OUTPATIENT_CLINIC_OR_DEPARTMENT_OTHER): Payer: Medicare Other

## 2016-09-20 ENCOUNTER — Encounter: Payer: Self-pay | Admitting: *Deleted

## 2016-09-20 ENCOUNTER — Ambulatory Visit (HOSPITAL_BASED_OUTPATIENT_CLINIC_OR_DEPARTMENT_OTHER): Payer: Medicare Other | Admitting: Oncology

## 2016-09-20 ENCOUNTER — Other Ambulatory Visit (HOSPITAL_BASED_OUTPATIENT_CLINIC_OR_DEPARTMENT_OTHER): Payer: Medicare Other

## 2016-09-20 VITALS — BP 130/84 | HR 84 | Temp 98.5°F | Resp 18 | Ht 65.0 in | Wt 146.6 lb

## 2016-09-20 DIAGNOSIS — Z5111 Encounter for antineoplastic chemotherapy: Secondary | ICD-10-CM | POA: Diagnosis not present

## 2016-09-20 DIAGNOSIS — N051 Unspecified nephritic syndrome with focal and segmental glomerular lesions: Secondary | ICD-10-CM

## 2016-09-20 DIAGNOSIS — C50312 Malignant neoplasm of lower-inner quadrant of left female breast: Secondary | ICD-10-CM

## 2016-09-20 DIAGNOSIS — Z170 Estrogen receptor positive status [ER+]: Secondary | ICD-10-CM | POA: Diagnosis not present

## 2016-09-20 DIAGNOSIS — N185 Chronic kidney disease, stage 5: Secondary | ICD-10-CM

## 2016-09-20 LAB — COMPREHENSIVE METABOLIC PANEL
ALBUMIN: 4.1 g/dL (ref 3.5–5.0)
ALK PHOS: 72 U/L (ref 40–150)
ALT: 19 U/L (ref 0–55)
AST: 16 U/L (ref 5–34)
Anion Gap: 11 mEq/L (ref 3–11)
BILIRUBIN TOTAL: 0.51 mg/dL (ref 0.20–1.20)
BUN: 8.9 mg/dL (ref 7.0–26.0)
CO2: 27 meq/L (ref 22–29)
CREATININE: 0.8 mg/dL (ref 0.6–1.1)
Calcium: 10.2 mg/dL (ref 8.4–10.4)
Chloride: 106 mEq/L (ref 98–109)
GLUCOSE: 94 mg/dL (ref 70–140)
Potassium: 3.3 mEq/L — ABNORMAL LOW (ref 3.5–5.1)
SODIUM: 144 meq/L (ref 136–145)
TOTAL PROTEIN: 6.6 g/dL (ref 6.4–8.3)

## 2016-09-20 LAB — CBC WITH DIFFERENTIAL/PLATELET
BASO%: 0.4 % (ref 0.0–2.0)
Basophils Absolute: 0 10*3/uL (ref 0.0–0.1)
EOS ABS: 0 10*3/uL (ref 0.0–0.5)
EOS%: 1.6 % (ref 0.0–7.0)
HCT: 38.7 % (ref 34.8–46.6)
HEMOGLOBIN: 12.2 g/dL (ref 11.6–15.9)
LYMPH%: 20.5 % (ref 14.0–49.7)
MCH: 28.8 pg (ref 25.1–34.0)
MCHC: 31.5 g/dL (ref 31.5–36.0)
MCV: 91.3 fL (ref 79.5–101.0)
MONO#: 0.4 10*3/uL (ref 0.1–0.9)
MONO%: 17.3 % — AB (ref 0.0–14.0)
NEUT%: 60.2 % (ref 38.4–76.8)
NEUTROS ABS: 1.5 10*3/uL (ref 1.5–6.5)
Platelets: 201 10*3/uL (ref 145–400)
RBC: 4.24 10*6/uL (ref 3.70–5.45)
RDW: 15.6 % — AB (ref 11.2–14.5)
WBC: 2.5 10*3/uL — AB (ref 3.9–10.3)
lymph#: 0.5 10*3/uL — ABNORMAL LOW (ref 0.9–3.3)

## 2016-09-20 MED ORDER — HEPARIN SOD (PORK) LOCK FLUSH 100 UNIT/ML IV SOLN
500.0000 [IU] | Freq: Once | INTRAVENOUS | Status: AC | PRN
  Administered 2016-09-20: 500 [IU]
  Filled 2016-09-20: qty 5

## 2016-09-20 MED ORDER — DEXAMETHASONE SODIUM PHOSPHATE 10 MG/ML IJ SOLN
INTRAMUSCULAR | Status: AC
  Filled 2016-09-20: qty 1

## 2016-09-20 MED ORDER — PACLITAXEL CHEMO INJECTION 300 MG/50ML
80.0000 mg/m2 | Freq: Once | INTRAVENOUS | Status: AC
  Administered 2016-09-20: 144 mg via INTRAVENOUS
  Filled 2016-09-20: qty 24

## 2016-09-20 MED ORDER — SODIUM CHLORIDE 0.9 % IV SOLN
Freq: Once | INTRAVENOUS | Status: AC
  Administered 2016-09-20: 10:00:00 via INTRAVENOUS

## 2016-09-20 MED ORDER — DIPHENHYDRAMINE HCL 25 MG PO CAPS
ORAL_CAPSULE | ORAL | Status: AC
  Filled 2016-09-20: qty 1

## 2016-09-20 MED ORDER — DEXAMETHASONE SODIUM PHOSPHATE 10 MG/ML IJ SOLN
4.0000 mg | Freq: Once | INTRAMUSCULAR | Status: AC
  Administered 2016-09-20: 4 mg via INTRAVENOUS

## 2016-09-20 MED ORDER — FAMOTIDINE IN NACL 20-0.9 MG/50ML-% IV SOLN
INTRAVENOUS | Status: AC
  Filled 2016-09-20: qty 50

## 2016-09-20 MED ORDER — SODIUM CHLORIDE 0.9% FLUSH
10.0000 mL | INTRAVENOUS | Status: DC | PRN
  Administered 2016-09-20: 10 mL
  Filled 2016-09-20: qty 10

## 2016-09-20 MED ORDER — FAMOTIDINE IN NACL 20-0.9 MG/50ML-% IV SOLN
20.0000 mg | Freq: Once | INTRAVENOUS | Status: AC
  Administered 2016-09-20: 20 mg via INTRAVENOUS

## 2016-09-20 MED ORDER — DIPHENHYDRAMINE HCL 25 MG PO CAPS
25.0000 mg | ORAL_CAPSULE | Freq: Once | ORAL | Status: AC
  Administered 2016-09-20: 25 mg via ORAL

## 2016-09-20 NOTE — Patient Instructions (Signed)
[facility] Discharge Instructions for Patients Receiving Chemotherapy  Today you received the following chemotherapy agents Taxol   To help prevent nausea and vomiting after your treatment, we encourage you to take your nausea medication as directed.   If you develop nausea and vomiting that is not controlled by your nausea medication, call the clinic.   BELOW ARE SYMPTOMS THAT SHOULD BE REPORTED IMMEDIATELY:  *FEVER GREATER THAN 100.5 F  *CHILLS WITH OR WITHOUT FEVER  NAUSEA AND VOMITING THAT IS NOT CONTROLLED WITH YOUR NAUSEA MEDICATION  *UNUSUAL SHORTNESS OF BREATH  *UNUSUAL BRUISING OR BLEEDING  TENDERNESS IN MOUTH AND THROAT WITH OR WITHOUT PRESENCE OF ULCERS  *URINARY PROBLEMS  *BOWEL PROBLEMS  UNUSUAL RASH Items with * indicate a potential emergency and should be followed up as soon as possible.  Feel free to call the clinic you have any questions or concerns. The clinic phone number is [phone].  Please show the CHEMO ALERT CARD at check-in to the Emergency Department and triage nurse.   

## 2016-09-23 ENCOUNTER — Other Ambulatory Visit: Payer: Self-pay | Admitting: *Deleted

## 2016-09-23 DIAGNOSIS — C50312 Malignant neoplasm of lower-inner quadrant of left female breast: Secondary | ICD-10-CM

## 2016-09-23 DIAGNOSIS — Z170 Estrogen receptor positive status [ER+]: Principal | ICD-10-CM

## 2016-09-27 ENCOUNTER — Other Ambulatory Visit (HOSPITAL_BASED_OUTPATIENT_CLINIC_OR_DEPARTMENT_OTHER): Payer: Medicare Other

## 2016-09-27 ENCOUNTER — Ambulatory Visit (HOSPITAL_BASED_OUTPATIENT_CLINIC_OR_DEPARTMENT_OTHER): Payer: Medicare Other

## 2016-09-27 VITALS — BP 118/74 | HR 87 | Temp 98.6°F | Resp 18

## 2016-09-27 DIAGNOSIS — Z5111 Encounter for antineoplastic chemotherapy: Secondary | ICD-10-CM

## 2016-09-27 DIAGNOSIS — C50312 Malignant neoplasm of lower-inner quadrant of left female breast: Secondary | ICD-10-CM | POA: Diagnosis not present

## 2016-09-27 DIAGNOSIS — Z170 Estrogen receptor positive status [ER+]: Principal | ICD-10-CM

## 2016-09-27 DIAGNOSIS — N185 Chronic kidney disease, stage 5: Secondary | ICD-10-CM

## 2016-09-27 DIAGNOSIS — N051 Unspecified nephritic syndrome with focal and segmental glomerular lesions: Secondary | ICD-10-CM

## 2016-09-27 LAB — CBC WITH DIFFERENTIAL/PLATELET
BASO%: 0.4 % (ref 0.0–2.0)
BASOS ABS: 0 10*3/uL (ref 0.0–0.1)
EOS ABS: 0 10*3/uL (ref 0.0–0.5)
EOS%: 1.3 % (ref 0.0–7.0)
HCT: 36.9 % (ref 34.8–46.6)
HEMOGLOBIN: 12.1 g/dL (ref 11.6–15.9)
LYMPH%: 19.6 % (ref 14.0–49.7)
MCH: 29.3 pg (ref 25.1–34.0)
MCHC: 32.7 g/dL (ref 31.5–36.0)
MCV: 89.5 fL (ref 79.5–101.0)
MONO#: 0.4 10*3/uL (ref 0.1–0.9)
MONO%: 14.6 % — AB (ref 0.0–14.0)
NEUT#: 1.7 10*3/uL (ref 1.5–6.5)
NEUT%: 64.1 % (ref 38.4–76.8)
Platelets: 193 10*3/uL (ref 145–400)
RBC: 4.12 10*6/uL (ref 3.70–5.45)
RDW: 16.3 % — ABNORMAL HIGH (ref 11.2–14.5)
WBC: 2.7 10*3/uL — ABNORMAL LOW (ref 3.9–10.3)
lymph#: 0.5 10*3/uL — ABNORMAL LOW (ref 0.9–3.3)

## 2016-09-27 LAB — COMPREHENSIVE METABOLIC PANEL
ALBUMIN: 4 g/dL (ref 3.5–5.0)
ALK PHOS: 67 U/L (ref 40–150)
ALT: 18 U/L (ref 0–55)
ANION GAP: 9 meq/L (ref 3–11)
AST: 16 U/L (ref 5–34)
BILIRUBIN TOTAL: 0.52 mg/dL (ref 0.20–1.20)
BUN: 11.8 mg/dL (ref 7.0–26.0)
CO2: 28 mEq/L (ref 22–29)
Calcium: 10.3 mg/dL (ref 8.4–10.4)
Chloride: 106 mEq/L (ref 98–109)
Creatinine: 0.9 mg/dL (ref 0.6–1.1)
EGFR: 88 mL/min/{1.73_m2} — AB (ref >90)
GLUCOSE: 98 mg/dL (ref 70–140)
POTASSIUM: 3.4 meq/L — AB (ref 3.5–5.1)
SODIUM: 142 meq/L (ref 136–145)
Total Protein: 6.4 g/dL (ref 6.4–8.3)

## 2016-09-27 LAB — TECHNOLOGIST REVIEW: Technologist Review: 2

## 2016-09-27 MED ORDER — SODIUM CHLORIDE 0.9 % IV SOLN
Freq: Once | INTRAVENOUS | Status: AC
  Administered 2016-09-27: 10:00:00 via INTRAVENOUS

## 2016-09-27 MED ORDER — HEPARIN SOD (PORK) LOCK FLUSH 100 UNIT/ML IV SOLN
500.0000 [IU] | Freq: Once | INTRAVENOUS | Status: AC | PRN
  Administered 2016-09-27: 500 [IU]
  Filled 2016-09-27: qty 5

## 2016-09-27 MED ORDER — SODIUM CHLORIDE 0.9% FLUSH
10.0000 mL | INTRAVENOUS | Status: DC | PRN
  Administered 2016-09-27: 10 mL
  Filled 2016-09-27: qty 10

## 2016-09-27 MED ORDER — FAMOTIDINE IN NACL 20-0.9 MG/50ML-% IV SOLN
INTRAVENOUS | Status: AC
  Filled 2016-09-27: qty 50

## 2016-09-27 MED ORDER — DEXTROSE 5 % IV SOLN
80.0000 mg/m2 | Freq: Once | INTRAVENOUS | Status: AC
  Administered 2016-09-27: 144 mg via INTRAVENOUS
  Filled 2016-09-27: qty 24

## 2016-09-27 MED ORDER — FAMOTIDINE IN NACL 20-0.9 MG/50ML-% IV SOLN
20.0000 mg | Freq: Once | INTRAVENOUS | Status: AC
  Administered 2016-09-27: 20 mg via INTRAVENOUS

## 2016-09-27 MED ORDER — DIPHENHYDRAMINE HCL 25 MG PO CAPS
25.0000 mg | ORAL_CAPSULE | Freq: Once | ORAL | Status: AC
  Administered 2016-09-27: 25 mg via ORAL

## 2016-09-27 MED ORDER — DEXAMETHASONE SODIUM PHOSPHATE 10 MG/ML IJ SOLN
INTRAMUSCULAR | Status: AC
  Filled 2016-09-27: qty 1

## 2016-09-27 MED ORDER — DEXAMETHASONE SODIUM PHOSPHATE 10 MG/ML IJ SOLN
4.0000 mg | Freq: Once | INTRAMUSCULAR | Status: AC
  Administered 2016-09-27: 4 mg via INTRAVENOUS

## 2016-09-27 MED ORDER — DIPHENHYDRAMINE HCL 25 MG PO CAPS
ORAL_CAPSULE | ORAL | Status: AC
  Filled 2016-09-27: qty 2

## 2016-09-27 NOTE — Patient Instructions (Signed)
[facility] Discharge Instructions for Patients Receiving Chemotherapy  Today you received the following chemotherapy agents Taxol   To help prevent nausea and vomiting after your treatment, we encourage you to take your nausea medication as directed.   If you develop nausea and vomiting that is not controlled by your nausea medication, call the clinic.   BELOW ARE SYMPTOMS THAT SHOULD BE REPORTED IMMEDIATELY:  *FEVER GREATER THAN 100.5 F  *CHILLS WITH OR WITHOUT FEVER  NAUSEA AND VOMITING THAT IS NOT CONTROLLED WITH YOUR NAUSEA MEDICATION  *UNUSUAL SHORTNESS OF BREATH  *UNUSUAL BRUISING OR BLEEDING  TENDERNESS IN MOUTH AND THROAT WITH OR WITHOUT PRESENCE OF ULCERS  *URINARY PROBLEMS  *BOWEL PROBLEMS  UNUSUAL RASH Items with * indicate a potential emergency and should be followed up as soon as possible.  Feel free to call the clinic you have any questions or concerns. The clinic phone number is [phone].  Please show the CHEMO ALERT CARD at check-in to the Emergency Department and triage nurse.   

## 2016-09-30 DIAGNOSIS — Z940 Kidney transplant status: Secondary | ICD-10-CM | POA: Diagnosis not present

## 2016-09-30 DIAGNOSIS — Z7982 Long term (current) use of aspirin: Secondary | ICD-10-CM | POA: Diagnosis not present

## 2016-09-30 DIAGNOSIS — Z992 Dependence on renal dialysis: Secondary | ICD-10-CM | POA: Diagnosis not present

## 2016-09-30 DIAGNOSIS — I151 Hypertension secondary to other renal disorders: Secondary | ICD-10-CM | POA: Diagnosis not present

## 2016-09-30 DIAGNOSIS — E139 Other specified diabetes mellitus without complications: Secondary | ICD-10-CM | POA: Diagnosis not present

## 2016-09-30 DIAGNOSIS — Z9089 Acquired absence of other organs: Secondary | ICD-10-CM | POA: Diagnosis not present

## 2016-09-30 DIAGNOSIS — E212 Other hyperparathyroidism: Secondary | ICD-10-CM | POA: Diagnosis not present

## 2016-09-30 DIAGNOSIS — Z79899 Other long term (current) drug therapy: Secondary | ICD-10-CM | POA: Diagnosis not present

## 2016-09-30 DIAGNOSIS — Z4822 Encounter for aftercare following kidney transplant: Secondary | ICD-10-CM | POA: Diagnosis not present

## 2016-09-30 DIAGNOSIS — Z9889 Other specified postprocedural states: Secondary | ICD-10-CM | POA: Diagnosis not present

## 2016-09-30 DIAGNOSIS — I120 Hypertensive chronic kidney disease with stage 5 chronic kidney disease or end stage renal disease: Secondary | ICD-10-CM | POA: Diagnosis not present

## 2016-09-30 DIAGNOSIS — Z8619 Personal history of other infectious and parasitic diseases: Secondary | ICD-10-CM | POA: Diagnosis not present

## 2016-09-30 DIAGNOSIS — E869 Volume depletion, unspecified: Secondary | ICD-10-CM | POA: Diagnosis not present

## 2016-09-30 DIAGNOSIS — C50919 Malignant neoplasm of unspecified site of unspecified female breast: Secondary | ICD-10-CM | POA: Diagnosis not present

## 2016-09-30 DIAGNOSIS — N186 End stage renal disease: Secondary | ICD-10-CM | POA: Diagnosis not present

## 2016-09-30 DIAGNOSIS — D899 Disorder involving the immune mechanism, unspecified: Secondary | ICD-10-CM | POA: Diagnosis not present

## 2016-10-03 ENCOUNTER — Telehealth: Payer: Self-pay | Admitting: *Deleted

## 2016-10-03 NOTE — Telephone Encounter (Signed)
Left vm for Lattie Haw -[facility] transplant coordinator notifying final chemo will be on 8/14. Contact information provided.

## 2016-10-04 ENCOUNTER — Encounter: Payer: Self-pay | Admitting: Adult Health

## 2016-10-04 ENCOUNTER — Ambulatory Visit (HOSPITAL_BASED_OUTPATIENT_CLINIC_OR_DEPARTMENT_OTHER): Payer: Medicare Other

## 2016-10-04 ENCOUNTER — Other Ambulatory Visit (HOSPITAL_BASED_OUTPATIENT_CLINIC_OR_DEPARTMENT_OTHER): Payer: Medicare Other

## 2016-10-04 ENCOUNTER — Ambulatory Visit (HOSPITAL_BASED_OUTPATIENT_CLINIC_OR_DEPARTMENT_OTHER): Payer: Medicare Other | Admitting: Adult Health

## 2016-10-04 ENCOUNTER — Encounter: Payer: Self-pay | Admitting: *Deleted

## 2016-10-04 VITALS — BP 128/80 | HR 84 | Temp 97.8°F | Resp 18 | Ht 65.0 in | Wt 147.1 lb

## 2016-10-04 DIAGNOSIS — C50312 Malignant neoplasm of lower-inner quadrant of left female breast: Secondary | ICD-10-CM | POA: Diagnosis not present

## 2016-10-04 DIAGNOSIS — Z940 Kidney transplant status: Secondary | ICD-10-CM

## 2016-10-04 DIAGNOSIS — Z170 Estrogen receptor positive status [ER+]: Principal | ICD-10-CM

## 2016-10-04 DIAGNOSIS — Z5111 Encounter for antineoplastic chemotherapy: Secondary | ICD-10-CM | POA: Diagnosis not present

## 2016-10-04 DIAGNOSIS — N051 Unspecified nephritic syndrome with focal and segmental glomerular lesions: Secondary | ICD-10-CM

## 2016-10-04 DIAGNOSIS — N185 Chronic kidney disease, stage 5: Secondary | ICD-10-CM

## 2016-10-04 LAB — COMPREHENSIVE METABOLIC PANEL
ALBUMIN: 4.1 g/dL (ref 3.5–5.0)
ALK PHOS: 73 U/L (ref 40–150)
ALT: 18 U/L (ref 0–55)
ANION GAP: 8 meq/L (ref 3–11)
AST: 16 U/L (ref 5–34)
BILIRUBIN TOTAL: 0.46 mg/dL (ref 0.20–1.20)
BUN: 16.9 mg/dL (ref 7.0–26.0)
CO2: 29 meq/L (ref 22–29)
CREATININE: 0.9 mg/dL (ref 0.6–1.1)
Calcium: 10.3 mg/dL (ref 8.4–10.4)
Chloride: 107 mEq/L (ref 98–109)
EGFR: 84 mL/min/{1.73_m2} — ABNORMAL LOW (ref >90)
Glucose: 101 mg/dl (ref 70–140)
Potassium: 3.7 mEq/L (ref 3.5–5.1)
Sodium: 145 mEq/L (ref 136–145)
TOTAL PROTEIN: 6.7 g/dL (ref 6.4–8.3)

## 2016-10-04 LAB — CBC WITH DIFFERENTIAL/PLATELET
BASO%: 0.6 % (ref 0.0–2.0)
Basophils Absolute: 0 10*3/uL (ref 0.0–0.1)
EOS%: 0.7 % (ref 0.0–7.0)
Eosinophils Absolute: 0 10*3/uL (ref 0.0–0.5)
HCT: 37.6 % (ref 34.8–46.6)
HGB: 12.2 g/dL (ref 11.6–15.9)
LYMPH%: 13.7 % — AB (ref 14.0–49.7)
MCH: 29.2 pg (ref 25.1–34.0)
MCHC: 32.4 g/dL (ref 31.5–36.0)
MCV: 90.1 fL (ref 79.5–101.0)
MONO#: 0.5 10*3/uL (ref 0.1–0.9)
MONO%: 9.2 % (ref 0.0–14.0)
NEUT%: 75.8 % (ref 38.4–76.8)
NEUTROS ABS: 3.8 10*3/uL (ref 1.5–6.5)
PLATELETS: 217 10*3/uL (ref 145–400)
RBC: 4.18 10*6/uL (ref 3.70–5.45)
RDW: 16.5 % — ABNORMAL HIGH (ref 11.2–14.5)
WBC: 5 10*3/uL (ref 3.9–10.3)
lymph#: 0.7 10*3/uL — ABNORMAL LOW (ref 0.9–3.3)

## 2016-10-04 LAB — TECHNOLOGIST REVIEW

## 2016-10-04 MED ORDER — PACLITAXEL CHEMO INJECTION 300 MG/50ML
80.0000 mg/m2 | Freq: Once | INTRAVENOUS | Status: AC
  Administered 2016-10-04: 144 mg via INTRAVENOUS
  Filled 2016-10-04: qty 24

## 2016-10-04 MED ORDER — HEPARIN SOD (PORK) LOCK FLUSH 100 UNIT/ML IV SOLN
500.0000 [IU] | Freq: Once | INTRAVENOUS | Status: AC | PRN
  Administered 2016-10-04: 500 [IU]
  Filled 2016-10-04: qty 5

## 2016-10-04 MED ORDER — SODIUM CHLORIDE 0.9 % IV SOLN
Freq: Once | INTRAVENOUS | Status: AC
  Administered 2016-10-04: 10:00:00 via INTRAVENOUS

## 2016-10-04 MED ORDER — FAMOTIDINE IN NACL 20-0.9 MG/50ML-% IV SOLN
INTRAVENOUS | Status: AC
  Filled 2016-10-04: qty 50

## 2016-10-04 MED ORDER — DEXAMETHASONE SODIUM PHOSPHATE 10 MG/ML IJ SOLN
4.0000 mg | Freq: Once | INTRAMUSCULAR | Status: AC
  Administered 2016-10-04: 4 mg via INTRAVENOUS

## 2016-10-04 MED ORDER — SODIUM CHLORIDE 0.9% FLUSH
10.0000 mL | INTRAVENOUS | Status: DC | PRN
  Administered 2016-10-04: 10 mL
  Filled 2016-10-04: qty 10

## 2016-10-04 MED ORDER — DEXAMETHASONE SODIUM PHOSPHATE 10 MG/ML IJ SOLN
INTRAMUSCULAR | Status: AC
  Filled 2016-10-04: qty 1

## 2016-10-04 MED ORDER — DIPHENHYDRAMINE HCL 25 MG PO CAPS
ORAL_CAPSULE | ORAL | Status: AC
  Filled 2016-10-04: qty 1

## 2016-10-04 MED ORDER — FAMOTIDINE IN NACL 20-0.9 MG/50ML-% IV SOLN
20.0000 mg | Freq: Once | INTRAVENOUS | Status: AC
  Administered 2016-10-04: 20 mg via INTRAVENOUS

## 2016-10-04 MED ORDER — DIPHENHYDRAMINE HCL 25 MG PO CAPS
25.0000 mg | ORAL_CAPSULE | Freq: Once | ORAL | Status: AC
  Administered 2016-10-04: 25 mg via ORAL

## 2016-10-04 NOTE — Patient Instructions (Signed)
[facility] Discharge Instructions for Patients Receiving Chemotherapy  Today you received the following chemotherapy agents Taxol   To help prevent nausea and vomiting after your treatment, we encourage you to take your nausea medication as directed.   If you develop nausea and vomiting that is not controlled by your nausea medication, call the clinic.   BELOW ARE SYMPTOMS THAT SHOULD BE REPORTED IMMEDIATELY:  *FEVER GREATER THAN 100.5 F  *CHILLS WITH OR WITHOUT FEVER  NAUSEA AND VOMITING THAT IS NOT CONTROLLED WITH YOUR NAUSEA MEDICATION  *UNUSUAL SHORTNESS OF BREATH  *UNUSUAL BRUISING OR BLEEDING  TENDERNESS IN MOUTH AND THROAT WITH OR WITHOUT PRESENCE OF ULCERS  *URINARY PROBLEMS  *BOWEL PROBLEMS  UNUSUAL RASH Items with * indicate a potential emergency and should be followed up as soon as possible.  Feel free to call the clinic you have any questions or concerns. The clinic phone number is [phone].  Please show the CHEMO ALERT CARD at check-in to the Emergency Department and triage nurse.   

## 2016-10-04 NOTE — Progress Notes (Signed)
[facility]  Telephone:(336) [phone] Fax:(336) [phone]     ID: DIASIA HENKEN DOB: 1960/06/27  MR#: [id number]  OYD#:[id number]  Patient Care Team: [name], MD as PCP - General (Obstetrics and Gynecology) [name], MD as Consulting Physician (Nephrology) [name], MD as Consulting Physician (Obstetrics and Gynecology) [name], MD as Consulting Physician (General Surgery) Magrinat, [name], MD as Consulting Physician (Oncology) [name], MD as Consulting Physician (Radiation Oncology) [name], MD as Referring Physician (Surgery) [name], DO (Internal Medicine) Scot Dock, NP OTHER MD:  CHIEF COMPLAINT: Estrogen receptor positive breast cancer  CURRENT TREATMENT: Neoadjuvant chemotherapy  INTERVAL HISTORY: Erika Freeman returns today for follow-up and treatment of her estrogen receptor positive breast cancer, accompanied by her husband. Today is day 1 cycle 11 of 12 planned weekly cycles of paclitaxel.  She denies peripheral neuropathy.  She continues to tolerate the chemotherapy very well.     REVIEW OF SYSTEMS: Erika Freeman denies fevers, chills, nausea, vomiting, constipation, diarrhea, mucositis, fatigue, or any other concerns.    BREAST CANCER HISTORY: From the original intake note:  "Erika Freeman" herself noted a mass in her left breast. She ignored it because she thought it was related to menstruation. However as it persisted through 1. She brought it to Dr. [name] attention and on 05/10/2016 she underwent left diagnostic mammography with tomography [facility] The breast density was category C. In the left breast lower central area there was a new circumscribed mass. This was firm and fixed in the lower inner left breast at middle depth. Ultrasonography confirmed a 2.6 cm hypoechoic irregular mass at the 6:30 o'clock radiant 3 cm from the nipple. There was also a 0.5 cm  nodule 1.4 cm medial to the mass just described.  Biopsy of the left breast mass in question 05/10/2016 showed([id number]) invasive ductal carcinoma, grade 3, estrogen receptor 70% positive with weak staining intensity, progesterone receptor negative, with no HER-2 amplification, the signals ratio being 1.52 and the number per cell 2.35. The proliferation marker was 90%.  Her subsequent history is as detailed below  OF NOTE: The patient has a history of focal segmental glomerular sclerosis with end-stage renal disease and is status post unrelated donor renal transplant 04/15/2013 as part of appeared donor exchange with her husband donating one kidney, the other harvested from a 55 year old [place] man. She continues on immunosuppression with mycophenolate and tacrolimus.  PAST MEDICAL HISTORY: Past Medical History:  Diagnosis Date  . Anemia   . Cancer [facility])    breast cancer  . Chills   . Chronic kidney disease    resolved kidney transplant   . Cough   . Fibroid tumor   . GERD (gastroesophageal reflux disease)   . Heart murmur   . Hypertension    DX  30 YRS AGO  . Umbilical hernia s/p primary repair 01/10/2012 01/24/2012  . Vomiting     PAST SURGICAL HISTORY: Past Surgical History:  Procedure Laterality Date  . BREAST SURGERY  2012   left - fibroadenoma  . CAPD INSERTION  01/10/2012   Procedure: LAPAROSCOPIC INSERTION CONTINUOUS AMBULATORY PERITONEAL DIALYSIS  (CAPD) CATHETER;  Surgeon: Adin Hector, MD;  Location: [place];  Service: General;  Laterality: N/A;  LAPAROSCOPIC CAPD CATHETER PLACEMENT OMENTOPEXY  . CESAREAN SECTION  1996  . DILATION AND CURETTAGE OF UTERUS    . KIDNEY TRANSPLANT    . PARATHYROIDECTOMY  2000 - approximate  . PORTACATH  PLACEMENT Right 05/24/2016   Procedure: INSERTION PORT-A-CATH WITH [place];  Surgeon: [name], MD;  Location: [facility];  Service: General;  Laterality: Right;  . RENAL BIOPSY  2011  . UMBILICAL HERNIA REPAIR  01/10/2012   Procedure:  HERNIA REPAIR UMBILICAL ADULT;  Surgeon: Adin Hector, MD;  Location: [place];  Service: General;  Laterality: N/A;    FAMILY HISTORY Family History  Problem Relation Age of Onset  . Cancer Maternal Uncle        lung  . Cancer Cousin        breast  . Cancer Cousin        breast  The patient has very little information regarding her father. Her mother is living, 81 years old as of March 2018. The patient had one full brother, diagnosed with prostate cancer in his 56s. The patient has 2 half-brothers and 1 half-sister. There is no history of breast or ovarian cancer in the family to the patient's knowledge   GYNECOLOGIC HISTORY:  No LMP recorded.  Menarche age 48, first live birth age 72, the patient is GX P3. Her periods are regular, the last 5 or 6 days, with no heavy days. 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 Her husband white works at home [employer]. Son [name] lives in [place] and as Freight forwarder of a Therapist, art. Son [name] lives in [place] and is a Air traffic controller. Son [name] lives in [place] and is studying based trombone. The patient has 2 grandchildren. She attends a local [employer]:  not in place    HEALTH MAINTENANCE: Social History  Substance Use Topics  . Smoking status: Never Smoker  . Smokeless tobacco: Never Used  . Alcohol use No     Colonoscopy:  PAP:  Bone density:   Allergies  Allergen Reactions  . No Known Allergies     Current Outpatient Prescriptions  Medication Sig Dispense Refill  . aspirin 81 MG tablet Take 81 mg by mouth every morning.     . diphenhydrAMINE (BENADRYL) 25 MG tablet Take 25 mg by mouth daily as needed for allergies.    . Iron, Ferrous Sulfate, 142 (45 Fe) MG TBCR Take 1 tablet by mouth every morning.     . lidocaine-prilocaine (EMLA) cream Apply to affected  area once 30 g 3  . loratadine (CLARITIN) 10 MG tablet Take 1 tablet (10 mg total) by mouth daily. 100 tablet 0  . lovastatin (MEVACOR) 20 MG tablet Take 20 mg by mouth every Monday, Wednesday, and Friday.     . potassium chloride (MICRO-K) 10 MEQ CR capsule TAKE ONE CAPSULE BY MOUTH DAILY 90 capsule 3  . predniSONE (DELTASONE) 5 MG tablet Take 5 mg by mouth every morning.     . prochlorperazine (COMPAZINE) 10 MG tablet TAKE 1 TABLET BY MOUTH EVERY 6 HOURS AS NEEDED FOR NAUSEA OR VOMITING (Patient taking differently: TAKE 10 MG BY MOUTH EVERY 6 HOURS AS NEEDED FOR NAUSEA OR VOMITING) 385 tablet 1  . SENSIPAR 60 MG tablet Take 60 mg by mouth 3 (three) times a week.     . tacrolimus (PROGRAF) 1 MG capsule Take 3 mg by mouth 2 (two) times daily.     No current facility-administered medications for this visit.     OBJECTIVE:   Vitals:   10/04/16 0902  BP: 128/80  Pulse: 84  Resp: 18  Temp: 97.8 F (36.6 C)     Body mass index is 24.48 kg/m.  GENERAL: Patient is a well appearing female in no acute distress HEENT:  Sclerae anicteric.  Oropharynx clear and moist. No ulcerations or evidence of oropharyngeal candidiasis. Neck is supple.  NODES:  No cervical, supraclavicular, or axillary lymphadenopathy palpated.  BREAST EXAM:  Deferred. LUNGS:  Clear to auscultation bilaterally.  No wheezes or rhonchi. HEART:  Regular rate and rhythm. No murmur appreciated. ABDOMEN:  Soft, nontender.  Positive, normoactive bowel sounds. No organomegaly palpated. MSK:  No focal spinal tenderness to palpation. Full range of motion bilaterally in the upper extremities. EXTREMITIES:  No peripheral edema.   SKIN:  Clear with no obvious rashes or skin changes. No nail dyscrasia. NEURO:  Nonfocal. Well oriented.  Appropriate affect. ECOG FS:1 - Symptomatic but completely ambulatory      LAB RESULTS:  CMP     Component Value Date/Time   NA 142 09/27/2016 0849   K 3.4 (L) 09/27/2016 0849   CL 106  07/15/2016 2008   CO2 28 09/27/2016 0849   GLUCOSE 98 09/27/2016 0849   BUN 11.8 09/27/2016 0849   CREATININE 0.9 09/27/2016 0849   CALCIUM 10.3 09/27/2016 0849   PROT 6.4 09/27/2016 0849   ALBUMIN 4.0 09/27/2016 0849   AST 16 09/27/2016 0849   ALT 18 09/27/2016 0849   ALKPHOS 67 09/27/2016 0849   BILITOT 0.52 09/27/2016 0849   GFRNONAA >60 07/15/2016 2008   GFRAA >60 07/15/2016 2008    No results found for: Ronnald Ramp, A1GS, A2GS, BETS, BETA2SER, GAMS, MSPIKE, SPEI  No results found for: KPAFRELGTCHN, LAMBDASER, [facility]  Lab Results  Component Value Date   WBC 5.0 10/04/2016   NEUTROABS 3.8 10/04/2016   HGB 12.2 10/04/2016   HCT 37.6 10/04/2016   MCV 90.1 10/04/2016   PLT 217 10/04/2016      Chemistry      Component Value Date/Time   NA 142 09/27/2016 0849   K 3.4 (L) 09/27/2016 0849   CL 106 07/15/2016 2008   CO2 28 09/27/2016 0849   BUN 11.8 09/27/2016 0849   CREATININE 0.9 09/27/2016 0849      Component Value Date/Time   CALCIUM 10.3 09/27/2016 0849   ALKPHOS 67 09/27/2016 0849   AST 16 09/27/2016 0849   ALT 18 09/27/2016 0849   BILITOT 0.52 09/27/2016 0849       No results found for: LABCA2  No components found for: ACZYSA630  No results for input(s): INR in the last 168 hours.  Urinalysis    Component Value Date/Time   COLORURINE YELLOW 11/24/2006 1539   APPEARANCEUR CLEAR 11/24/2006 1539   LABSPEC 1.018 11/24/2006 1539   PHURINE 6.0 11/24/2006 1539   GLUCOSEU NEGATIVE 11/24/2006 1539   HGBUR SMALL (A) 11/24/2006 1539   BILIRUBINUR NEGATIVE 11/24/2006 1539   KETONESUR NEGATIVE 11/24/2006 1539   PROTEINUR 100 (A) 11/24/2006 1539   UROBILINOGEN 1.0 11/24/2006 1539   NITRITE NEGATIVE 11/24/2006 1539   LEUKOCYTESUR MODERATE (A) 11/24/2006 1539     STUDIES: No results found.  ELIGIBLE FOR AVAILABLE RESEARCH PROTOCOL: no  ASSESSMENT: 56 y.o. [place], [place] woman status post left breast lower inner quadrant biopsy 05/10/2016  for a clinical  T2 N0, prognostic stage IIB invaside ductal carcinoma, grade 3, weekly estrogen receptor positive, progesterone receptor negative, with no HER-2 amplification, and the MIB-1 at 90%  (a) biopsy of the 0.6 mm satellite at the 6:30 o'clock  radiant in the left breast 06/02/2016 showed invasive ductal carcinoma, grade 3, estrogen receptor 20% positive, with weak staining intensity, progesterone receptor negative with no HER-2 amplification, and an MIB-1 of 70% (identical to larger mass)   (1) neoadjuvant chemotherapy consisting of doxorubicin and cyclophosphamide in dose dense fashion 4 started 06/01/2016, completed 07/12/2016  followed by Paclitaxel weekly 12 started 07/26/2016  (2) definitive surgery to follow, with breast conservation preferred if feasible   (a) satellite lesion noted on [place] to be included in final surgery sample  (3) adjuvant radiation as appropriate  (4) anti-estrogens to follow the completion of local treatment  PLAN:  Erika Freeman is doing well today.  Her CBC is normal and I reviewed this with her in detail.  Her CMP is pending.   She will proceed with cycle 11 of Paclitaxel today.  As she is nearing the end of her neoadjuvant chemotherapy.  I will order a mammogram and ultrasound to evaluate her response to neoadjuvant chemotherapy.  Due to her h/o kidney transplant she cannot undergo a MRI.    Erika Freeman will return on Tuesday, August, 14 for her final chemotherapy and an appointment with Dr. Jana Hakim.   She has an appointment with Dr. Donne Hazel on Thursday, October 13, 2016.    Erika Freeman knows to call between now and her next appointment for any questions or concerns she may have.    A total of (30) minutes of face-to-face time was spent with this patient with greater than 50% of that time in counseling and care-coordination.   Scot Dock, NP   10/04/2016 9:19 AM Medical Oncology and Hematology [facility] [address] [place],  [place] [address] Tel. [phone]    Fax. [phone]

## 2016-10-06 DIAGNOSIS — Z86000 Personal history of in-situ neoplasm of breast: Secondary | ICD-10-CM | POA: Diagnosis not present

## 2016-10-06 DIAGNOSIS — Z01419 Encounter for gynecological examination (general) (routine) without abnormal findings: Secondary | ICD-10-CM | POA: Diagnosis not present

## 2016-10-06 DIAGNOSIS — Z6824 Body mass index (BMI) 24.0-24.9, adult: Secondary | ICD-10-CM | POA: Diagnosis not present

## 2016-10-06 DIAGNOSIS — Z779 Other contact with and (suspected) exposures hazardous to health: Secondary | ICD-10-CM | POA: Diagnosis not present

## 2016-10-11 ENCOUNTER — Ambulatory Visit (HOSPITAL_BASED_OUTPATIENT_CLINIC_OR_DEPARTMENT_OTHER): Payer: Medicare Other | Admitting: Oncology

## 2016-10-11 ENCOUNTER — Ambulatory Visit (HOSPITAL_BASED_OUTPATIENT_CLINIC_OR_DEPARTMENT_OTHER): Payer: Medicare Other

## 2016-10-11 ENCOUNTER — Other Ambulatory Visit (HOSPITAL_BASED_OUTPATIENT_CLINIC_OR_DEPARTMENT_OTHER): Payer: Medicare Other

## 2016-10-11 VITALS — BP 128/78 | HR 85 | Temp 98.3°F | Resp 18 | Ht 65.0 in | Wt 146.4 lb

## 2016-10-11 DIAGNOSIS — Z170 Estrogen receptor positive status [ER+]: Secondary | ICD-10-CM

## 2016-10-11 DIAGNOSIS — C50312 Malignant neoplasm of lower-inner quadrant of left female breast: Secondary | ICD-10-CM

## 2016-10-11 DIAGNOSIS — N051 Unspecified nephritic syndrome with focal and segmental glomerular lesions: Secondary | ICD-10-CM

## 2016-10-11 DIAGNOSIS — N185 Chronic kidney disease, stage 5: Secondary | ICD-10-CM

## 2016-10-11 DIAGNOSIS — Z5111 Encounter for antineoplastic chemotherapy: Secondary | ICD-10-CM

## 2016-10-11 LAB — CBC WITH DIFFERENTIAL/PLATELET
BASO%: 0.3 % (ref 0.0–2.0)
BASOS ABS: 0 10*3/uL (ref 0.0–0.1)
EOS ABS: 0 10*3/uL (ref 0.0–0.5)
EOS%: 0.5 % (ref 0.0–7.0)
HCT: 36.9 % (ref 34.8–46.6)
HGB: 11.8 g/dL (ref 11.6–15.9)
LYMPH%: 8.9 % — AB (ref 14.0–49.7)
MCH: 29.2 pg (ref 25.1–34.0)
MCHC: 32 g/dL (ref 31.5–36.0)
MCV: 91.3 fL (ref 79.5–101.0)
MONO#: 0.5 10*3/uL (ref 0.1–0.9)
MONO%: 6.5 % (ref 0.0–14.0)
NEUT#: 6.4 10*3/uL (ref 1.5–6.5)
NEUT%: 83.8 % — ABNORMAL HIGH (ref 38.4–76.8)
PLATELETS: 208 10*3/uL (ref 145–400)
RBC: 4.04 10*6/uL (ref 3.70–5.45)
RDW: 15.1 % — ABNORMAL HIGH (ref 11.2–14.5)
WBC: 7.7 10*3/uL (ref 3.9–10.3)
lymph#: 0.7 10*3/uL — ABNORMAL LOW (ref 0.9–3.3)
nRBC: 0 % (ref 0–0)

## 2016-10-11 LAB — COMPREHENSIVE METABOLIC PANEL
ALT: 19 U/L (ref 0–55)
ANION GAP: 8 meq/L (ref 3–11)
AST: 17 U/L (ref 5–34)
Albumin: 4 g/dL (ref 3.5–5.0)
Alkaline Phosphatase: 68 U/L (ref 40–150)
BILIRUBIN TOTAL: 0.58 mg/dL (ref 0.20–1.20)
BUN: 12.2 mg/dL (ref 7.0–26.0)
CHLORIDE: 107 meq/L (ref 98–109)
CO2: 28 meq/L (ref 22–29)
Calcium: 10.4 mg/dL (ref 8.4–10.4)
Creatinine: 0.9 mg/dL (ref 0.6–1.1)
EGFR: 84 mL/min/{1.73_m2} — ABNORMAL LOW (ref >90)
Glucose: 104 mg/dl (ref 70–140)
POTASSIUM: 3.6 meq/L (ref 3.5–5.1)
Sodium: 143 mEq/L (ref 136–145)
Total Protein: 6.5 g/dL (ref 6.4–8.3)

## 2016-10-11 MED ORDER — SODIUM CHLORIDE 0.9% FLUSH
10.0000 mL | INTRAVENOUS | Status: DC | PRN
  Administered 2016-10-11: 10 mL
  Filled 2016-10-11: qty 10

## 2016-10-11 MED ORDER — DEXAMETHASONE SODIUM PHOSPHATE 10 MG/ML IJ SOLN
4.0000 mg | Freq: Once | INTRAMUSCULAR | Status: AC
  Administered 2016-10-11: 4 mg via INTRAVENOUS

## 2016-10-11 MED ORDER — PACLITAXEL CHEMO INJECTION 300 MG/50ML
80.0000 mg/m2 | Freq: Once | INTRAVENOUS | Status: AC
  Administered 2016-10-11: 144 mg via INTRAVENOUS
  Filled 2016-10-11: qty 24

## 2016-10-11 MED ORDER — SODIUM CHLORIDE 0.9 % IV SOLN
Freq: Once | INTRAVENOUS | Status: AC
  Administered 2016-10-11: 14:00:00 via INTRAVENOUS

## 2016-10-11 MED ORDER — DEXAMETHASONE SODIUM PHOSPHATE 10 MG/ML IJ SOLN
INTRAMUSCULAR | Status: AC
  Filled 2016-10-11: qty 1

## 2016-10-11 MED ORDER — DIPHENHYDRAMINE HCL 25 MG PO CAPS
ORAL_CAPSULE | ORAL | Status: AC
  Filled 2016-10-11: qty 1

## 2016-10-11 MED ORDER — DIPHENHYDRAMINE HCL 25 MG PO CAPS
25.0000 mg | ORAL_CAPSULE | Freq: Once | ORAL | Status: AC
  Administered 2016-10-11: 25 mg via ORAL

## 2016-10-11 MED ORDER — FAMOTIDINE IN NACL 20-0.9 MG/50ML-% IV SOLN
20.0000 mg | Freq: Once | INTRAVENOUS | Status: AC
  Administered 2016-10-11: 20 mg via INTRAVENOUS

## 2016-10-11 MED ORDER — FAMOTIDINE IN NACL 20-0.9 MG/50ML-% IV SOLN
INTRAVENOUS | Status: AC
  Filled 2016-10-11: qty 50

## 2016-10-11 MED ORDER — HEPARIN SOD (PORK) LOCK FLUSH 100 UNIT/ML IV SOLN
500.0000 [IU] | Freq: Once | INTRAVENOUS | Status: AC | PRN
  Administered 2016-10-11: 500 [IU]
  Filled 2016-10-11: qty 5

## 2016-10-11 NOTE — Patient Instructions (Addendum)
[facility] Discharge Instructions for Patients Receiving Chemotherapy  Today you received the following chemotherapy agents Taxol   To help prevent nausea and vomiting after your treatment, we encourage you to take your nausea medication as directed.   If you develop nausea and vomiting that is not controlled by your nausea medication, call the clinic.   BELOW ARE SYMPTOMS THAT SHOULD BE REPORTED IMMEDIATELY:  *FEVER GREATER THAN 100.5 F  *CHILLS WITH OR WITHOUT FEVER  NAUSEA AND VOMITING THAT IS NOT CONTROLLED WITH YOUR NAUSEA MEDICATION  *UNUSUAL SHORTNESS OF BREATH  *UNUSUAL BRUISING OR BLEEDING  TENDERNESS IN MOUTH AND THROAT WITH OR WITHOUT PRESENCE OF ULCERS  *URINARY PROBLEMS  *BOWEL PROBLEMS  UNUSUAL RASH Items with * indicate a potential emergency and should be followed up as soon as possible.  Feel free to call the clinic you have any questions or concerns. The clinic phone number is [phone].  Please show the CHEMO ALERT CARD at check-in to the Emergency Department and triage nurse.   

## 2016-10-11 NOTE — Progress Notes (Signed)
[name]  Telephone:(336) [phone] Fax:(336) [phone]     ID: [name] DOB: [birth year]  MR#: [id number]  KGU#:[id number]  Patient Care Team: [name], MD as PCP - General (Obstetrics and Gynecology) [name], MD as Consulting Physician (Nephrology) [name], MD as Consulting Physician (Obstetrics and Gynecology) [name], MD as Consulting Physician (General Surgery) Magrinat, [name], MD as Consulting Physician (Oncology) [name], MD as Consulting Physician (Radiation Oncology) [name], MD as Referring Physician (Surgery) [name], DO (Internal Medicine) [name], MD OTHER MD:  CHIEF COMPLAINT: Estrogen receptor positive breast cancer  CURRENT TREATMENT: Completing neoadjuvant chemotherapy  INTERVAL HISTORY: Erika Freeman returns today for follow-up and treatment of her estrogen receptor positive breast cancer, accompanied by her husband and mother. Today is day 1 cycle 12 of 12 planned cycles of weekly paclitaxel. She completes her treatments today.  She has tolerated paclitaxel remarkably well and has developed no symptoms of peripheral neuropathy. She is minimally fatigued, but still manages to do all her normal daily activities.  REVIEW OF SYSTEMS: A detailed review of systems today was otherwise noncontributory  BREAST CANCER HISTORY: From the original intake note:  "[name]" herself noted a mass in her left breast. She ignored it because she thought it was related to menstruation. However as it persisted through 1. She brought it to Dr. [name] attention and on 05/10/2016 she underwent left diagnostic mammography with tomography and [facility]. The breast density was category C. In the left breast lower central area there was a new circumscribed mass. This was firm and fixed in the lower inner left breast at middle depth. Ultrasonography confirmed a 2.6 cm  hypoechoic irregular mass at the 6:30 o'clock radiant 3 cm from the nipple. There was also a 0.5 cm nodule 1.4 cm medial to the mass just described.  Biopsy of the left breast mass in question 05/10/2016 showed([id number]) invasive ductal carcinoma, grade 3, estrogen receptor 70% positive with weak staining intensity, progesterone receptor negative, with no HER-2 amplification, the signals ratio being 1.52 and the number per cell 2.35. The proliferation marker was 90%.  Her subsequent history is as detailed below  OF NOTE: The patient has a history of focal segmental glomerular sclerosis with end-stage renal disease and is status post unrelated donor renal transplant 04/15/2013 as part of appeared donor exchange with her husband donating one kidney, the other harvested from a 55 year old [place] man. She continues on immunosuppression with mycophenolate and tacrolimus.  PAST MEDICAL HISTORY: Past Medical History:  Diagnosis Date  . Anemia   . Cancer [facility])    breast cancer  . Chills   . Chronic kidney disease    resolved kidney transplant   . Cough   . Fibroid tumor   . GERD (gastroesophageal reflux disease)   . Heart murmur   . Hypertension    DX  30 YRS AGO  . Umbilical hernia s/p primary repair 01/10/2012 01/24/2012  . Vomiting     PAST SURGICAL HISTORY: Past Surgical History:  Procedure Laterality Date  . BREAST SURGERY  2012   left - fibroadenoma  . CAPD INSERTION  01/10/2012   Procedure: LAPAROSCOPIC INSERTION CONTINUOUS AMBULATORY PERITONEAL DIALYSIS  (CAPD) CATHETER;  Surgeon: Adin Hector, MD;  Location: [facility];  Service: General;  Laterality: N/A;  LAPAROSCOPIC CAPD CATHETER PLACEMENT OMENTOPEXY  . CESAREAN SECTION  1996  . DILATION AND CURETTAGE OF UTERUS    . KIDNEY TRANSPLANT    .  PARATHYROIDECTOMY  2000 - approximate  . PORTACATH PLACEMENT Right 05/24/2016   Procedure: INSERTION PORT-A-CATH WITH [place];  Surgeon: [name], MD;  Location: [place];  Service:  General;  Laterality: Right;  . RENAL BIOPSY  2011  . UMBILICAL HERNIA REPAIR  01/10/2012   Procedure: HERNIA REPAIR UMBILICAL ADULT;  Surgeon: Adin Hector, MD;  Location: [facility];  Service: General;  Laterality: N/A;    FAMILY HISTORY Family History  Problem Relation Age of Onset  . Cancer Maternal Uncle        lung  . Cancer Cousin        breast  . Cancer Cousin        breast  The patient has very little information regarding her father. Her mother is living, 55 years old as of March 2018. The patient had one full brother, diagnosed with prostate cancer in his 56s. The patient has 2 half-brothers and 1 half-sister. There is no history of breast or ovarian cancer in the family to the patient's knowledge   GYNECOLOGIC HISTORY:  No LMP recorded.  Menarche age 5, first live birth age 56, the patient is GX P3. Her periods are regular, the last 5 or 6 days, with no heavy days. 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 Her husband white works at home [employer]. Son Erika Freeman lives in [place] and as Freight forwarder of a Therapist, art. Son Erika Freeman lives in [place] and is a Air traffic controller. Son Erika Freeman lives in [place] and is studying based trombone. The patient has 2 grandchildren. She attends a local [employer]:  not in place    HEALTH MAINTENANCE: Social History  Substance Use Topics  . Smoking status: Never Smoker  . Smokeless tobacco: Never Used  . Alcohol use No     Colonoscopy:  PAP:  Bone density:   Allergies  Allergen Reactions  . No Known Allergies     Current Outpatient Prescriptions  Medication Sig Dispense Refill  . aspirin 81 MG tablet Take 81 mg by mouth every morning.     . diphenhydrAMINE (BENADRYL) 25 MG tablet Take 25 mg by mouth daily as needed for allergies.    . Iron, Ferrous Sulfate, 142 (45 Fe) MG  TBCR Take 1 tablet by mouth every morning.     . lidocaine-prilocaine (EMLA) cream Apply to affected area once 30 g 3  . loratadine (CLARITIN) 10 MG tablet Take 1 tablet (10 mg total) by mouth daily. 100 tablet 0  . lovastatin (MEVACOR) 20 MG tablet Take 20 mg by mouth every Monday, Wednesday, and Friday.     . potassium chloride (MICRO-K) 10 MEQ CR capsule TAKE ONE CAPSULE BY MOUTH DAILY 90 capsule 3  . predniSONE (DELTASONE) 5 MG tablet Take 5 mg by mouth every morning.     . prochlorperazine (COMPAZINE) 10 MG tablet TAKE 1 TABLET BY MOUTH EVERY 6 HOURS AS NEEDED FOR NAUSEA OR VOMITING (Patient taking differently: TAKE 10 MG BY MOUTH EVERY 6 HOURS AS NEEDED FOR NAUSEA OR VOMITING) 385 tablet 1  . SENSIPAR 60 MG tablet Take 60 mg by mouth 3 (three) times a week.     . tacrolimus (PROGRAF) 1 MG capsule Take 3 mg by mouth 2 (two) times daily.     No current facility-administered medications for this visit.     OBJECTIVE: Middle-aged African-American woman  who appears well  Vitals:   10/11/16 1040  BP: 128/78  Pulse: 85  Resp: 18  Temp: 98.3 F (36.8 C)  SpO2: 100%     Body mass index is 24.36 kg/m.   ECOG FS:1 - Symptomatic but completely ambulatory   Sclerae unicteric, pupils round and equal Oropharynx clear and moist No cervical or supraclavicular adenopathy Lungs no rales or rhonchi Heart regular rate and rhythm Abd soft, nontender, positive bowel sounds MSK no focal spinal tenderness, no upper extremity lymphedema Neuro: nonfocal, well oriented, appropriate affect Breasts: The right breast is benign. I do not palpate a mass in the left breast. There are no skin or nipple changes of concern. Both axillae are benign.  LAB RESULTS:  CMP     Component Value Date/Time   NA 145 10/04/2016 0849   K 3.7 10/04/2016 0849   CL 106 07/15/2016 2008   CO2 29 10/04/2016 0849   GLUCOSE 101 10/04/2016 0849   BUN 16.9 10/04/2016 0849   CREATININE 0.9 10/04/2016 0849   CALCIUM 10.3  10/04/2016 0849   PROT 6.7 10/04/2016 0849   ALBUMIN 4.1 10/04/2016 0849   AST 16 10/04/2016 0849   ALT 18 10/04/2016 0849   ALKPHOS 73 10/04/2016 0849   BILITOT 0.46 10/04/2016 0849   GFRNONAA >60 07/15/2016 2008   GFRAA >60 07/15/2016 2008    No results found for: Ronnald Ramp, A1GS, A2GS, BETS, BETA2SER, GAMS, MSPIKE, SPEI  No results found for: Nils Pyle, [facility]  Lab Results  Component Value Date   WBC 7.7 10/11/2016   NEUTROABS 6.4 10/11/2016   HGB 11.8 10/11/2016   HCT 36.9 10/11/2016   MCV 91.3 10/11/2016   PLT 208 10/11/2016      Chemistry      Component Value Date/Time   NA 145 10/04/2016 0849   K 3.7 10/04/2016 0849   CL 106 07/15/2016 2008   CO2 29 10/04/2016 0849   BUN 16.9 10/04/2016 0849   CREATININE 0.9 10/04/2016 0849      Component Value Date/Time   CALCIUM 10.3 10/04/2016 0849   ALKPHOS 73 10/04/2016 0849   AST 16 10/04/2016 0849   ALT 18 10/04/2016 0849   BILITOT 0.46 10/04/2016 0849       No results found for: LABCA2  No components found for: [id number]  No results for input(s): INR in the last 168 hours.  Urinalysis    Component Value Date/Time   COLORURINE YELLOW 11/24/2006 1539   APPEARANCEUR CLEAR 11/24/2006 1539   LABSPEC 1.018 11/24/2006 1539   PHURINE 6.0 11/24/2006 1539   GLUCOSEU NEGATIVE 11/24/2006 1539   HGBUR SMALL (A) 11/24/2006 1539   BILIRUBINUR NEGATIVE 11/24/2006 1539   KETONESUR NEGATIVE 11/24/2006 1539   PROTEINUR 100 (A) 11/24/2006 1539   UROBILINOGEN 1.0 11/24/2006 1539   NITRITE NEGATIVE 11/24/2006 1539   LEUKOCYTESUR MODERATE (A) 11/24/2006 1539     STUDIES: No results found.  ELIGIBLE FOR AVAILABLE RESEARCH PROTOCOL: no  ASSESSMENT: 56 y.o. [place], [place] woman status post left breast lower inner quadrant biopsy 05/10/2016 for a clinical  T2 N0, prognostic stage IIB invaside ductal carcinoma, grade 3, weekly estrogen receptor positive, progesterone receptor negative, with  no HER-2 amplification, and the MIB-1 at 90%  (a) biopsy of the 0.6 mm satellite at the 6:30 o'clock radiant in the left breast 06/02/2016 showed invasive ductal carcinoma, grade 3, estrogen receptor 20% positive, with weak staining intensity, progesterone receptor negative with no HER-2 amplification, and an MIB-1 of 70% (identical to  larger mass)   (1) neoadjuvant chemotherapy consisting of doxorubicin and cyclophosphamide in dose dense fashion 4 started 06/01/2016, completed 07/12/2016  followed by Paclitaxel weekly 12 started 07/26/2016, completed 10/11/2016  (2) definitive surgery to follow, with breast conservation preferred if feasible   (a) satellite lesion noted on [place] to be included in final surgery sample  (3) adjuvant radiation as appropriate  (4) anti-estrogens to follow the completion of local treatment  PLAN:  [name] got to "ring the Bell" today. I think it is remarkable how well she has done with her chemotherapy, with no dose reductions or delays and receiving the full course.  She is already scheduled for repeat mammography and ultrasonography 10/13/2016 and she will see Dr. Donne Hazel shortly after those studies. She will then Proceed to definitive surgery. She may have her port removed at that time  After that of course she will start her radiation. She does have a vacation planned for November and it might be necessary to postpone the radiation until after that. Accordingly I am seeing them again October 9. We will very likely started anastrozole at that visit, with radiation to follow.  She knows to call for any problems that may develop before the next visit here.  [name], MD   10/11/2016 11:01 AM Medical Oncology and Hematology [facility] [address] [place],  [address] Tel. [phone]    Fax. [phone]

## 2016-10-13 ENCOUNTER — Ambulatory Visit
Admission: RE | Admit: 2016-10-13 | Discharge: 2016-10-13 | Disposition: A | Discharge status: Home / Self Care | Payer: Medicare Other | Source: Ambulatory Visit | Attending: Adult Health | Admitting: Adult Health

## 2016-10-13 ENCOUNTER — Other Ambulatory Visit: Payer: Self-pay | Admitting: General Surgery

## 2016-10-13 ENCOUNTER — Other Ambulatory Visit: Payer: Self-pay | Admitting: Adult Health

## 2016-10-13 DIAGNOSIS — C50312 Malignant neoplasm of lower-inner quadrant of left female breast: Secondary | ICD-10-CM

## 2016-10-13 DIAGNOSIS — Z170 Estrogen receptor positive status [ER+]: Principal | ICD-10-CM

## 2016-10-13 DIAGNOSIS — C50512 Malignant neoplasm of lower-outer quadrant of left female breast: Secondary | ICD-10-CM | POA: Diagnosis not present

## 2016-10-13 DIAGNOSIS — R922 Inconclusive mammogram: Secondary | ICD-10-CM | POA: Diagnosis not present

## 2016-10-13 DIAGNOSIS — N6324 Unspecified lump in the left breast, lower inner quadrant: Secondary | ICD-10-CM | POA: Diagnosis not present

## 2016-10-13 HISTORY — DX: Personal history of antineoplastic chemotherapy: Z92.21

## 2016-10-13 HISTORY — DX: Malignant neoplasm of unspecified site of unspecified female breast: C50.919

## 2016-10-17 ENCOUNTER — Encounter: Payer: Self-pay | Admitting: Radiation Oncology

## 2016-10-17 ENCOUNTER — Other Ambulatory Visit: Payer: Self-pay | Admitting: *Deleted

## 2016-10-17 DIAGNOSIS — C50312 Malignant neoplasm of lower-inner quadrant of left female breast: Secondary | ICD-10-CM

## 2016-10-17 DIAGNOSIS — Z170 Estrogen receptor positive status [ER+]: Principal | ICD-10-CM

## 2016-10-18 ENCOUNTER — Other Ambulatory Visit: Payer: Medicare Other

## 2016-10-18 ENCOUNTER — Ambulatory Visit: Payer: Medicare Other

## 2016-10-18 ENCOUNTER — Telehealth: Payer: Self-pay | Admitting: *Deleted

## 2016-10-18 NOTE — Telephone Encounter (Signed)
  Oncology Nurse Navigator Documentation  Navigator Location: [facility] (10/18/16 1100)   )Navigator Encounter Type: Telephone (10/18/16 1100) Telephone: [place] Call (10/18/16 1100)     Surgery Date: 11/14/16 (10/18/16 1100)             Patient Visit Type: MedOnc (10/18/16 1100) Treatment Phase: Final Chemo TX (10/11/16) (10/18/16 1100)  Called to congratulate on completion of chemo                          Time Spent with Patient: 15 (10/18/16 1100)

## 2016-10-20 ENCOUNTER — Encounter: Payer: Self-pay | Admitting: *Deleted

## 2016-10-28 DIAGNOSIS — D8989 Other specified disorders involving the immune mechanism, not elsewhere classified: Secondary | ICD-10-CM | POA: Diagnosis not present

## 2016-10-28 DIAGNOSIS — E212 Other hyperparathyroidism: Secondary | ICD-10-CM | POA: Diagnosis not present

## 2016-10-28 DIAGNOSIS — Z9221 Personal history of antineoplastic chemotherapy: Secondary | ICD-10-CM | POA: Diagnosis not present

## 2016-10-28 DIAGNOSIS — Z992 Dependence on renal dialysis: Secondary | ICD-10-CM | POA: Diagnosis not present

## 2016-10-28 DIAGNOSIS — Z131 Encounter for screening for diabetes mellitus: Secondary | ICD-10-CM | POA: Diagnosis not present

## 2016-10-28 DIAGNOSIS — Z7982 Long term (current) use of aspirin: Secondary | ICD-10-CM | POA: Diagnosis not present

## 2016-10-28 DIAGNOSIS — C50912 Malignant neoplasm of unspecified site of left female breast: Secondary | ICD-10-CM | POA: Diagnosis not present

## 2016-10-28 DIAGNOSIS — C50919 Malignant neoplasm of unspecified site of unspecified female breast: Secondary | ICD-10-CM | POA: Diagnosis not present

## 2016-10-28 DIAGNOSIS — Z4822 Encounter for aftercare following kidney transplant: Secondary | ICD-10-CM | POA: Diagnosis not present

## 2016-10-28 DIAGNOSIS — D899 Disorder involving the immune mechanism, unspecified: Secondary | ICD-10-CM | POA: Diagnosis not present

## 2016-10-28 DIAGNOSIS — N186 End stage renal disease: Secondary | ICD-10-CM | POA: Diagnosis not present

## 2016-10-28 DIAGNOSIS — Z79899 Other long term (current) drug therapy: Secondary | ICD-10-CM | POA: Diagnosis not present

## 2016-10-28 DIAGNOSIS — Z940 Kidney transplant status: Secondary | ICD-10-CM | POA: Diagnosis not present

## 2016-10-28 DIAGNOSIS — I120 Hypertensive chronic kidney disease with stage 5 chronic kidney disease or end stage renal disease: Secondary | ICD-10-CM | POA: Diagnosis not present

## 2016-10-28 DIAGNOSIS — I10 Essential (primary) hypertension: Secondary | ICD-10-CM | POA: Diagnosis not present

## 2016-10-28 DIAGNOSIS — E213 Hyperparathyroidism, unspecified: Secondary | ICD-10-CM | POA: Diagnosis not present

## 2016-11-07 NOTE — Pre-Procedure Instructions (Addendum)
Erika Freeman  11/07/2016      [facility] [place], [place] - [name] AT [place] [name] [place] [address] Phone: [phone] Fax: [phone]  [place] [address] [place] - [place] [address] [place] [address] Phone: [phone] Fax: [phone]    Your procedure is scheduled on September 17  Report to [facility] at 0730 A.M.  Call this number if you have problems the morning of surgery:  [phone]   Remember:  Do not eat food or drink liquids after midnight.  Please complete your 8oz of Boost Breeze or Water that was given to you at your preadmission appointment by 0530 on the day of your surgery   Continue all other medications as directed by your physician except follow these medication instructions before surgery   Take these medicines the morning of surgery with A SIP OF WATER predniSONE (DELTASONE), tacrolimus (PROGRAF)  7 days prior to surgery STOP taking any Aleve, Naproxen, Ibuprofen, Motrin, Advil, Goody's, BC's, all herbal medications, fish oil, and all vitamins  Follow your doctors instructions regarding your Aspirin.  If no instructions were given by the doctor you will need to call the office to get instructions.  Your pre admission RN will also call for those instructions    Do not wear jewelry, make-up or nail polish.  Do not wear lotions, powders, or perfumes, or deoderant.  Do not shave 48 hours prior to surgery.  Men may shave face and neck.  Do not bring valuables to the hospital.  [facility] is not responsible for any belongings or valuables.  Contacts, dentures or bridgework may not be worn into surgery.  Leave your suitcase in the car.  After surgery it may be brought to your room.  For patients admitted to the hospital, discharge time will be determined by your treatment team.  Patients discharged the day of surgery will not be  allowed to drive home.    Special instructions:   [place]- Preparing For Surgery  Before surgery, you can play an important role. Because skin is not sterile, your skin needs to be as free of germs as possible. You can reduce the number of germs on your skin by washing with CHG (chlorahexidine gluconate) Soap before surgery.  CHG is an antiseptic cleaner which kills germs and bonds with the skin to continue killing germs even after washing.  Please do not use if you have an allergy to CHG or antibacterial soaps. If your skin becomes reddened/irritated stop using the CHG.  Do not shave (including legs and underarms) for at least 48 hours prior to first CHG shower. It is OK to shave your face.  Please follow these instructions carefully.   1. Shower the NIGHT BEFORE SURGERY and the MORNING OF SURGERY with CHG.   2. If you chose to wash your hair, wash your hair first as usual with your normal shampoo.  3. After you shampoo, rinse your hair and body thoroughly to remove the shampoo.  4. Use CHG as you would any other liquid soap. You can apply CHG directly to the skin and wash gently with a scrungie or a clean washcloth.   5. Apply the CHG Soap to your body ONLY FROM THE NECK DOWN.  Do not use on open wounds or open sores. Avoid contact with your eyes, ears, mouth and genitals (private parts). Wash genitals (  private parts) with your normal soap.  6. Wash thoroughly, paying special attention to the area where your surgery will be performed.  7. Thoroughly rinse your body with warm water from the neck down.  8. DO NOT shower/wash with your normal soap after using and rinsing off the CHG Soap.  9. Pat yourself dry with a CLEAN TOWEL.   10. Wear CLEAN PAJAMAS   11. Place CLEAN SHEETS on your bed the night of your first shower and DO NOT SLEEP WITH PETS.    Day of Surgery: Do not apply any deodorants/lotions. Please wear clean clothes to the hospital/surgery center.      Please  read over the following fact sheets that you were given.

## 2016-11-08 ENCOUNTER — Encounter (HOSPITAL_COMMUNITY): Payer: Self-pay

## 2016-11-08 ENCOUNTER — Encounter (HOSPITAL_COMMUNITY)
Admission: RE | Admit: 2016-11-08 | Discharge: 2016-11-08 | Disposition: A | Discharge status: Home / Self Care | Payer: Medicare Other | Source: Ambulatory Visit | Attending: General Surgery | Admitting: General Surgery

## 2016-11-08 DIAGNOSIS — C50912 Malignant neoplasm of unspecified site of left female breast: Secondary | ICD-10-CM | POA: Insufficient documentation

## 2016-11-08 DIAGNOSIS — Z01818 Encounter for other preprocedural examination: Secondary | ICD-10-CM | POA: Insufficient documentation

## 2016-11-08 LAB — CBC
HCT: 40.1 % (ref 36.0–46.0)
HEMOGLOBIN: 12.6 g/dL (ref 12.0–15.0)
MCH: 28.6 pg (ref 26.0–34.0)
MCHC: 31.4 g/dL (ref 30.0–36.0)
MCV: 90.9 fL (ref 78.0–100.0)
Platelets: 175 10*3/uL (ref 150–400)
RBC: 4.41 MIL/uL (ref 3.87–5.11)
RDW: 14.2 % (ref 11.5–15.5)
WBC: 11.2 10*3/uL — ABNORMAL HIGH (ref 4.0–10.5)

## 2016-11-08 LAB — BASIC METABOLIC PANEL
ANION GAP: 8 (ref 5–15)
BUN: 12 mg/dL (ref 6–20)
CALCIUM: 9.9 mg/dL (ref 8.9–10.3)
CHLORIDE: 103 mmol/L (ref 101–111)
CO2: 26 mmol/L (ref 22–32)
Creatinine, Ser: 0.98 mg/dL (ref 0.44–1.00)
GFR calc Af Amer: >60 mL/min (ref >60)
GFR calc non Af Amer: >60 mL/min (ref >60)
Glucose, Bld: 99 mg/dL (ref 65–99)
POTASSIUM: 3 mmol/L — AB (ref 3.5–5.1)
SODIUM: 137 mmol/L (ref 135–145)

## 2016-11-08 NOTE — Progress Notes (Signed)
Pt. Denies all chest concerns., Pt. Followed with transplant clinic at [facility]. Echo done - 2018 prior to chemotherapy. Will have chart reviewed by Anesth. Team due to transplant history.

## 2016-11-09 NOTE — Progress Notes (Signed)
Anesthesia Chart Review:   Pt is a 56 year old female scheduled for L breast lumpectomy with radioactive seed localization and sentinel lymph node biopsy on 11/14/2016 with [name], MD  - Nephrologist is Erling Cruz, MD - Oncologist is Lurline Del, MD  PMH includes:  HTN, heart murmur (unspecified), CKD (s/p kidney transplant 2015 at [facility]), anemia, GERD. Never smoker. BMI 25  Medications include: ASA 81 mg, iron, lovastatin, prednisone, Prograf  BP (!) 139/93   Pulse 83   Temp 36.7 C (Oral)   Resp 18   Ht 5\' 5"  (1.651 m)   Wt 151 lb 3 oz (68.6 kg)   LMP 04/27/2016 (Exact Date)   SpO2 99%   BMI 25.16 kg/m   Preoperative labs reviewed.    1 view CXR 05/24/16.   EKG 05/24/16: NSR. Nonspecific T wave abnormality. Prolonged QT  Echo 05/31/16:  - Left ventricle: The cavity size was normal. There was mild concentric hypertrophy. Systolic function was vigorous. The estimated ejection fraction was in the range of 65% to 70%. Wall motion was normal; there were no regional wall motion abnormalities. Doppler parameters are consistent with abnormal left ventricular relaxation (grade 1 diastolic dysfunction). There was no evidence of elevated ventricular filling pressure by Doppler parameters. - Aortic valve: There was no regurgitation. - Aortic root: The aortic root was normal in size. - Mitral valve: There was no regurgitation. - Right ventricle: The cavity size was normal. Wall thickness was normal. Systolic function was normal. - Right atrium: The atrium was normal in size. - Pulmonary arteries: Systolic pressure was within the normal range. - Inferior vena cava: The vessel was normal in size. - Pericardium, extracardiac: A mild pericardial effusion was identified posterior to the heart. Features were not consistent with tamponade physiology. IMPRESSIONS: - Hyperdynamic LVEF with intracavitary gradient 29 mmHg at rest and 74 mmHg with Valsalva maneuver. Normal strain  parameters: Global longitudinal strain: -18%. Lateral S prime: 9 cm/sec.  Stress echo 07/11/13 (care everywhere):  - The patient had no chest pain. - The patient achieved 91 % of maximum predicted heart rate. - The METS achieved was 9. - Exercise capacity was average. - Normal left ventricular function and global wall motion with stress. - Negative exercise echocardiography for inducible ischemia at target heart  rate. - Negative stress ECG for inducible ischemia at target heart rate.  If no changes, I anticipate pt can proceed with surgery as scheduled.   Willeen Cass, FNP-BC [facility]/Anesthesiology Phone: [phone] 11/09/2016 12:20 PM

## 2016-11-10 ENCOUNTER — Ambulatory Visit
Admission: RE | Admit: 2016-11-10 | Discharge: 2016-11-10 | Disposition: A | Discharge status: Home / Self Care | Payer: Medicare Other | Source: Ambulatory Visit | Attending: General Surgery | Admitting: General Surgery

## 2016-11-10 DIAGNOSIS — Z170 Estrogen receptor positive status [ER+]: Principal | ICD-10-CM

## 2016-11-10 DIAGNOSIS — C50312 Malignant neoplasm of lower-inner quadrant of left female breast: Secondary | ICD-10-CM

## 2016-11-10 DIAGNOSIS — C50311 Malignant neoplasm of lower-inner quadrant of right female breast: Secondary | ICD-10-CM | POA: Diagnosis not present

## 2016-11-13 NOTE — Anesthesia Preprocedure Evaluation (Signed)
Anesthesia Evaluation  Patient identified by MRN, date of birth, ID band Patient awake    Reviewed: Allergy & Precautions, NPO status , Patient's Chart, lab work & pertinent test results  Airway Mallampati: II  TM Distance: >3 FB     Dental  (+) Dental Advisory Given   Pulmonary neg pulmonary ROS,    breath sounds clear to auscultation       Cardiovascular hypertension,  Rhythm:Regular Rate:Normal  TTE 05/2016 - Left ventricle: Mild concentric hypertrophy. Estimated ejection fraction was in the range of 65% to 70%. Grade 1 diastolic dysfunction. - Pericardium, extracardiac: A mild pericardial effusion was identified posterior to the heart. Features were not consistent with tamponade physiology.   Neuro/Psych    GI/Hepatic GERD  ,  Endo/Other  negative endocrine ROS  Renal/GU CRFRenal diseaseHx renal transplant     Musculoskeletal   Abdominal   Peds  Hematology  (+) anemia ,   Anesthesia Other Findings Breast cancer  Reproductive/Obstetrics                            Anesthesia Physical  Anesthesia Plan  ASA: III  Anesthesia Plan: General   Post-op Pain Management:  Regional for Post-op pain   Induction: Intravenous  PONV Risk Score and Plan: 4 or greater and Ondansetron, Dexamethasone, Midazolam, Scopolamine patch - Pre-op, Propofol infusion and Treatment may vary due to age or medical condition  Airway Management Planned: LMA  Additional Equipment: None  Intra-op Plan:   Post-operative Plan: Extubation in OR  Informed Consent: I have reviewed the patients History and Physical, chart, labs and discussed the procedure including the risks, benefits and alternatives for the proposed anesthesia with the patient or authorized representative who has indicated his/her understanding and acceptance.   Dental advisory given  Plan Discussed with: CRNA  Anesthesia Plan Comments:         Anesthesia Quick Evaluation

## 2016-11-14 ENCOUNTER — Encounter (HOSPITAL_COMMUNITY)
Admission: RE | Disposition: A | Discharge status: Home / Self Care | Payer: Self-pay | Source: Ambulatory Visit | Attending: General Surgery

## 2016-11-14 ENCOUNTER — Ambulatory Visit (HOSPITAL_COMMUNITY): Payer: Medicare Other | Admitting: Emergency Medicine

## 2016-11-14 ENCOUNTER — Ambulatory Visit (HOSPITAL_COMMUNITY)
Admission: RE | Admit: 2016-11-14 | Discharge: 2016-11-14 | Disposition: A | Discharge status: Home / Self Care | Payer: Medicare Other | Source: Ambulatory Visit | Attending: General Surgery | Admitting: General Surgery

## 2016-11-14 ENCOUNTER — Encounter (HOSPITAL_COMMUNITY): Payer: Self-pay | Admitting: Certified Registered"

## 2016-11-14 ENCOUNTER — Ambulatory Visit
Admission: RE | Admit: 2016-11-14 | Discharge: 2016-11-14 | Disposition: A | Discharge status: Home / Self Care | Payer: Medicare Other | Source: Ambulatory Visit | Attending: General Surgery | Admitting: General Surgery

## 2016-11-14 ENCOUNTER — Ambulatory Visit (HOSPITAL_COMMUNITY): Payer: Medicare Other | Admitting: Anesthesiology

## 2016-11-14 DIAGNOSIS — R928 Other abnormal and inconclusive findings on diagnostic imaging of breast: Secondary | ICD-10-CM | POA: Diagnosis not present

## 2016-11-14 DIAGNOSIS — C50312 Malignant neoplasm of lower-inner quadrant of left female breast: Secondary | ICD-10-CM

## 2016-11-14 DIAGNOSIS — Z79899 Other long term (current) drug therapy: Secondary | ICD-10-CM | POA: Insufficient documentation

## 2016-11-14 DIAGNOSIS — C50512 Malignant neoplasm of lower-outer quadrant of left female breast: Secondary | ICD-10-CM | POA: Insufficient documentation

## 2016-11-14 DIAGNOSIS — D649 Anemia, unspecified: Secondary | ICD-10-CM | POA: Insufficient documentation

## 2016-11-14 DIAGNOSIS — Z7982 Long term (current) use of aspirin: Secondary | ICD-10-CM | POA: Insufficient documentation

## 2016-11-14 DIAGNOSIS — Z452 Encounter for adjustment and management of vascular access device: Secondary | ICD-10-CM | POA: Diagnosis not present

## 2016-11-14 DIAGNOSIS — Z170 Estrogen receptor positive status [ER+]: Principal | ICD-10-CM

## 2016-11-14 DIAGNOSIS — N184 Chronic kidney disease, stage 4 (severe): Secondary | ICD-10-CM | POA: Diagnosis not present

## 2016-11-14 DIAGNOSIS — I129 Hypertensive chronic kidney disease with stage 1 through stage 4 chronic kidney disease, or unspecified chronic kidney disease: Secondary | ICD-10-CM | POA: Diagnosis not present

## 2016-11-14 DIAGNOSIS — Z940 Kidney transplant status: Secondary | ICD-10-CM | POA: Insufficient documentation

## 2016-11-14 DIAGNOSIS — I10 Essential (primary) hypertension: Secondary | ICD-10-CM | POA: Insufficient documentation

## 2016-11-14 DIAGNOSIS — G8918 Other acute postprocedural pain: Secondary | ICD-10-CM | POA: Diagnosis not present

## 2016-11-14 DIAGNOSIS — C50912 Malignant neoplasm of unspecified site of left female breast: Secondary | ICD-10-CM | POA: Diagnosis not present

## 2016-11-14 HISTORY — PX: PORT-A-CATH REMOVAL: SHX5289

## 2016-11-14 HISTORY — PX: BREAST LUMPECTOMY WITH RADIOACTIVE SEED AND SENTINEL LYMPH NODE BIOPSY: SHX6550

## 2016-11-14 SURGERY — BREAST LUMPECTOMY WITH RADIOACTIVE SEED AND SENTINEL LYMPH NODE BIOPSY
Anesthesia: Regional | Site: Chest | Laterality: Right

## 2016-11-14 MED ORDER — BUPIVACAINE-EPINEPHRINE (PF) 0.25% -1:200000 IJ SOLN
INTRAMUSCULAR | Status: AC
  Filled 2016-11-14: qty 30

## 2016-11-14 MED ORDER — SODIUM CHLORIDE 0.9 % IV SOLN: 250.0000 mL | INTRAVENOUS | Status: DC | PRN

## 2016-11-14 MED ORDER — 0.9 % SODIUM CHLORIDE (POUR BTL) OPTIME
TOPICAL | Status: DC | PRN
  Administered 2016-11-14: 1000 mL

## 2016-11-14 MED ORDER — BUPIVACAINE-EPINEPHRINE 0.25% -1:200000 IJ SOLN
INTRAMUSCULAR | Status: DC | PRN
  Administered 2016-11-14: 7 mL

## 2016-11-14 MED ORDER — ACETAMINOPHEN 500 MG PO TABS
1000.0000 mg | ORAL_TABLET | ORAL | Status: AC
  Administered 2016-11-14: 1000 mg via ORAL
  Filled 2016-11-14: qty 2

## 2016-11-14 MED ORDER — PROPOFOL 10 MG/ML IV BOLUS
INTRAVENOUS | Status: AC
  Filled 2016-11-14: qty 20

## 2016-11-14 MED ORDER — TECHNETIUM TC 99M SULFUR COLLOID FILTERED
1.0000 | Freq: Once | INTRAVENOUS | Status: AC | PRN
  Administered 2016-11-14: 1 via INTRADERMAL

## 2016-11-14 MED ORDER — ACETAMINOPHEN 500 MG PO TABS: 1000.0000 mg | ORAL_TABLET | Freq: Four times a day (QID) | ORAL | Status: DC

## 2016-11-14 MED ORDER — LIDOCAINE 2% (20 MG/ML) 5 ML SYRINGE
INTRAMUSCULAR | Status: DC | PRN
  Administered 2016-11-14: 60 mg via INTRAVENOUS

## 2016-11-14 MED ORDER — EPHEDRINE 5 MG/ML INJ
INTRAVENOUS | Status: AC
  Filled 2016-11-14: qty 10

## 2016-11-14 MED ORDER — MIDAZOLAM HCL 2 MG/2ML IJ SOLN
INTRAMUSCULAR | Status: AC
  Filled 2016-11-14: qty 2

## 2016-11-14 MED ORDER — FENTANYL CITRATE (PF) 100 MCG/2ML IJ SOLN
INTRAMUSCULAR | Status: DC | PRN
  Administered 2016-11-14 (×2): 50 ug via INTRAVENOUS

## 2016-11-14 MED ORDER — FENTANYL CITRATE (PF) 100 MCG/2ML IJ SOLN
25.0000 ug | INTRAMUSCULAR | Status: DC | PRN
  Administered 2016-11-14: 25 ug via INTRAVENOUS

## 2016-11-14 MED ORDER — FENTANYL CITRATE (PF) 250 MCG/5ML IJ SOLN
INTRAMUSCULAR | Status: AC
  Filled 2016-11-14: qty 5

## 2016-11-14 MED ORDER — MORPHINE SULFATE (PF) 2 MG/ML IV SOLN: 2.0000 mg | INTRAVENOUS | Status: DC | PRN

## 2016-11-14 MED ORDER — SODIUM CHLORIDE 0.9 % IV SOLN: INTRAVENOUS | Status: DC

## 2016-11-14 MED ORDER — SODIUM CHLORIDE 0.9 % IJ SOLN
INTRAMUSCULAR | Status: AC
  Filled 2016-11-14: qty 10

## 2016-11-14 MED ORDER — DEXAMETHASONE SODIUM PHOSPHATE 4 MG/ML IJ SOLN
INTRAMUSCULAR | Status: DC | PRN
  Administered 2016-11-14: 8 mg via INTRAVENOUS

## 2016-11-14 MED ORDER — FENTANYL CITRATE (PF) 100 MCG/2ML IJ SOLN
INTRAMUSCULAR | Status: AC
  Administered 2016-11-14: 50 ug via INTRAVENOUS
  Filled 2016-11-14: qty 2

## 2016-11-14 MED ORDER — PROMETHAZINE HCL 25 MG/ML IJ SOLN: 6.2500 mg | INTRAMUSCULAR | Status: DC | PRN

## 2016-11-14 MED ORDER — ONDANSETRON HCL 4 MG/2ML IJ SOLN
INTRAMUSCULAR | Status: AC
  Filled 2016-11-14: qty 2

## 2016-11-14 MED ORDER — PHENYLEPHRINE HCL 10 MG/ML IJ SOLN
INTRAMUSCULAR | Status: DC | PRN
  Administered 2016-11-14: 40 ug/min via INTRAVENOUS

## 2016-11-14 MED ORDER — SODIUM CHLORIDE 0.9% FLUSH: 3.0000 mL | INTRAVENOUS | Status: DC | PRN

## 2016-11-14 MED ORDER — ONDANSETRON HCL 4 MG/2ML IJ SOLN
INTRAMUSCULAR | Status: DC | PRN
  Administered 2016-11-14: 4 mg via INTRAVENOUS

## 2016-11-14 MED ORDER — BUPIVACAINE-EPINEPHRINE (PF) 0.5% -1:200000 IJ SOLN
INTRAMUSCULAR | Status: DC | PRN
  Administered 2016-11-14: 30 mL

## 2016-11-14 MED ORDER — FENTANYL CITRATE (PF) 100 MCG/2ML IJ SOLN
50.0000 ug | Freq: Once | INTRAMUSCULAR | Status: AC
  Administered 2016-11-14: 50 ug via INTRAVENOUS

## 2016-11-14 MED ORDER — OXYCODONE HCL 5 MG PO TABS: 5.0000 mg | ORAL_TABLET | ORAL | Status: DC | PRN

## 2016-11-14 MED ORDER — EPHEDRINE SULFATE-NACL 50-0.9 MG/10ML-% IV SOSY
PREFILLED_SYRINGE | INTRAVENOUS | Status: DC | PRN
  Administered 2016-11-14 (×4): 5 mg via INTRAVENOUS

## 2016-11-14 MED ORDER — METHYLENE BLUE 0.5 % INJ SOLN
INTRAVENOUS | Status: AC
  Filled 2016-11-14: qty 10

## 2016-11-14 MED ORDER — FENTANYL CITRATE (PF) 100 MCG/2ML IJ SOLN
INTRAMUSCULAR | Status: AC
  Filled 2016-11-14: qty 2

## 2016-11-14 MED ORDER — MIDAZOLAM HCL 5 MG/5ML IJ SOLN
INTRAMUSCULAR | Status: DC | PRN
  Administered 2016-11-14: 2 mg via INTRAVENOUS

## 2016-11-14 MED ORDER — PHENYLEPHRINE 40 MCG/ML (10ML) SYRINGE FOR IV PUSH (FOR BLOOD PRESSURE SUPPORT)
PREFILLED_SYRINGE | INTRAVENOUS | Status: DC | PRN
  Administered 2016-11-14 (×2): 80 ug via INTRAVENOUS
  Administered 2016-11-14: 40 ug via INTRAVENOUS
  Administered 2016-11-14: 80 ug via INTRAVENOUS

## 2016-11-14 MED ORDER — LACTATED RINGERS IV SOLN
INTRAVENOUS | Status: DC
  Administered 2016-11-14 (×2): via INTRAVENOUS

## 2016-11-14 MED ORDER — CEFAZOLIN SODIUM-DEXTROSE 2-4 GM/100ML-% IV SOLN
2.0000 g | INTRAVENOUS | Status: AC
  Administered 2016-11-14: 2 g via INTRAVENOUS
  Filled 2016-11-14: qty 100

## 2016-11-14 MED ORDER — METHYLENE BLUE 0.5 % INJ SOLN
INTRAVENOUS | Status: DC | PRN
  Administered 2016-11-14: 5 mL

## 2016-11-14 MED ORDER — LIDOCAINE 2% (20 MG/ML) 5 ML SYRINGE
INTRAMUSCULAR | Status: AC
  Filled 2016-11-14: qty 5

## 2016-11-14 MED ORDER — DEXAMETHASONE SODIUM PHOSPHATE 10 MG/ML IJ SOLN
INTRAMUSCULAR | Status: AC
  Filled 2016-11-14: qty 1

## 2016-11-14 MED ORDER — OXYCODONE HCL 5 MG PO TABS: 5.0000 mg | ORAL_TABLET | Freq: Four times a day (QID) | ORAL | 0 refills | Status: DC | PRN

## 2016-11-14 MED ORDER — SODIUM CHLORIDE 0.9% FLUSH: 3.0000 mL | Freq: Two times a day (BID) | INTRAVENOUS | Status: DC

## 2016-11-14 MED ORDER — PROPOFOL 10 MG/ML IV BOLUS
INTRAVENOUS | Status: DC | PRN
Start: 2016-11-14 — End: 2016-11-14
  Administered 2016-11-14: 180 mg via INTRAVENOUS
  Administered 2016-11-14: 20 mg via INTRAVENOUS

## 2016-11-14 MED ORDER — PHENYLEPHRINE 40 MCG/ML (10ML) SYRINGE FOR IV PUSH (FOR BLOOD PRESSURE SUPPORT)
PREFILLED_SYRINGE | INTRAVENOUS | Status: AC
Start: 2016-11-14 — End: ?
  Filled 2016-11-14: qty 10

## 2016-11-14 SURGICAL SUPPLY — 60 items
APPLIER CLIP 9.375 MED OPEN (MISCELLANEOUS) ×3
BINDER BREAST LRG (GAUZE/BANDAGES/DRESSINGS) ×3 IMPLANT
BINDER BREAST XLRG (GAUZE/BANDAGES/DRESSINGS) IMPLANT
BLADE SURG 15 STRL LF DISP TIS (BLADE) ×2 IMPLANT
BLADE SURG 15 STRL SS (BLADE) ×1
CANISTER SUCT 3000ML PPV (MISCELLANEOUS) ×3 IMPLANT
CHLORAPREP W/TINT 10.5 ML (MISCELLANEOUS) IMPLANT
CHLORAPREP W/TINT 26ML (MISCELLANEOUS) ×3 IMPLANT
CLIP APPLIE 9.375 MED OPEN (MISCELLANEOUS) ×2 IMPLANT
COVER PROBE W GEL 5X96 (DRAPES) ×3 IMPLANT
COVER SURGICAL LIGHT HANDLE (MISCELLANEOUS) ×3 IMPLANT
DECANTER SPIKE VIAL GLASS SM (MISCELLANEOUS) ×3 IMPLANT
DERMABOND ADVANCED (GAUZE/BANDAGES/DRESSINGS) ×1
DERMABOND ADVANCED .7 DNX12 (GAUZE/BANDAGES/DRESSINGS) ×2 IMPLANT
DEVICE DUBIN SPECIMEN MAMMOGRA (MISCELLANEOUS) ×3 IMPLANT
DRAPE CHEST BREAST 15X10 FENES (DRAPES) ×3 IMPLANT
DRAPE LAPAROTOMY 100X72 PEDS (DRAPES) IMPLANT
DRAPE UTILITY XL STRL (DRAPES) ×3 IMPLANT
ELECT CAUTERY BLADE 6.4 (BLADE) IMPLANT
ELECT COATED BLADE 2.86 ST (ELECTRODE) ×3 IMPLANT
ELECT REM PT RETURN 9FT ADLT (ELECTROSURGICAL) ×3
ELECTRODE REM PT RTRN 9FT ADLT (ELECTROSURGICAL) ×2 IMPLANT
GAUZE SPONGE 4X4 16PLY XRAY LF (GAUZE/BANDAGES/DRESSINGS) IMPLANT
GLOVE BIO SURGEON STRL SZ7 (GLOVE) ×3 IMPLANT
GLOVE BIOGEL PI IND STRL 6.5 (GLOVE) ×2 IMPLANT
GLOVE BIOGEL PI IND STRL 7.5 (GLOVE) ×2 IMPLANT
GLOVE BIOGEL PI INDICATOR 6.5 (GLOVE) ×1
GLOVE BIOGEL PI INDICATOR 7.5 (GLOVE) ×1
GLOVE SURG SS PI 6.0 STRL IVOR (GLOVE) ×3 IMPLANT
GOWN STRL REUS W/ TWL LRG LVL3 (GOWN DISPOSABLE) ×4 IMPLANT
GOWN STRL REUS W/TWL LRG LVL3 (GOWN DISPOSABLE) ×2
ILLUMINATOR WAVEGUIDE N/F (MISCELLANEOUS) ×3 IMPLANT
KIT BASIN OR (CUSTOM PROCEDURE TRAY) ×3 IMPLANT
KIT MARKER MARGIN INK (KITS) ×3 IMPLANT
KIT ROOM TURNOVER OR (KITS) ×3 IMPLANT
MARKER SKIN DUAL TIP RULER LAB (MISCELLANEOUS) ×3 IMPLANT
NDL SAFETY ECLIPSE 18X1.5 (NEEDLE) ×2 IMPLANT
NEEDLE FILTER BLUNT 18X 1/2SAF (NEEDLE) ×1
NEEDLE FILTER BLUNT 18X1 1/2 (NEEDLE) ×2 IMPLANT
NEEDLE HYPO 18GX1.5 SHARP (NEEDLE) ×1
NEEDLE HYPO 25GX1X1/2 BEV (NEEDLE) ×6 IMPLANT
NS IRRIG 1000ML POUR BTL (IV SOLUTION) ×3 IMPLANT
PACK SURGICAL SETUP 50X90 (CUSTOM PROCEDURE TRAY) ×3 IMPLANT
PAD ARMBOARD 7.5X6 YLW CONV (MISCELLANEOUS) ×3 IMPLANT
PENCIL BUTTON HOLSTER BLD 10FT (ELECTRODE) ×3 IMPLANT
SPONGE LAP 18X18 X RAY DECT (DISPOSABLE) ×3 IMPLANT
STRIP CLOSURE SKIN 1/2X4 (GAUZE/BANDAGES/DRESSINGS) ×3 IMPLANT
SUT MNCRL AB 4-0 PS2 18 (SUTURE) ×6 IMPLANT
SUT SILK 2 0 SH (SUTURE) IMPLANT
SUT VIC AB 2-0 SH 27 (SUTURE) ×3
SUT VIC AB 2-0 SH 27XBRD (SUTURE) ×6 IMPLANT
SUT VIC AB 3-0 SH 27 (SUTURE) ×2
SUT VIC AB 3-0 SH 27X BRD (SUTURE) ×4 IMPLANT
SYR BULB 3OZ (MISCELLANEOUS) ×3 IMPLANT
SYR CONTROL 10ML LL (SYRINGE) ×3 IMPLANT
TOWEL OR 17X24 6PK STRL BLUE (TOWEL DISPOSABLE) ×3 IMPLANT
TOWEL OR 17X26 10 PK STRL BLUE (TOWEL DISPOSABLE) ×3 IMPLANT
TUBE CONNECTING 12X1/4 (SUCTIONS) ×3 IMPLANT
WATER STERILE IRR 1000ML POUR (IV SOLUTION) IMPLANT
YANKAUER SUCT BULB TIP NO VENT (SUCTIONS) ×3 IMPLANT

## 2016-11-14 NOTE — H&P (Signed)
46 yof referred by Dr Dory Horn for newly diagnosed left breast cancer several months ago. she has prior history of renal tx for fsgs and is followed by Dr Florene Glen. she is doing well from that. she has prior history of removing left fa in [place] and has cosmetic defect with that. she noted a left breast mass near prior excisional biopsy. she underwent mm that showed a left breast mass. on [place] this is a 2.6x1.8x1.5 cm mass. her axilla is negative. she has a 5 mm mass that is about 1.4 cm away that has a clip next to it. biopsy of the mass is a grade III IDC that is er positive, pr negative, her 2 negative, and Ki is 90%. she was recommended chemotherapy for this cancer and has completed that as of Tuesday. she has tolerated that well. she has repeat mm today (she cannot get mri due to renal tx. this shows larger mass with marked reduction in size and no discrete mass at satellitelocation. US shows a residual poorly defined mass measuring 7x5x8 mm and no smaller satellite mass. she is here with her husband to discuss options   Past Surgical History  Breast Biopsy  Left. multiple Cesarean Section - 1  Thyroid Surgery  Other Surgery  renal transplant  Diagnostic Studies History  Colonoscopy  1-5 years ago Mammogram  within last year Pap Smear  1-5 years ago  Allergies  No Known Allergies   Medication History  Medications Reconciled Lovastatin (20MG  Tablet, Oral) Active. Sensipar (60MG  Tablet, Oral) Active. Aspirin (81MG  Tablet DR, Oral) Active. Iron (45MG  Tablet, Oral) Active. Magnesium Lactate (84 MG (7MEQ) Tablet ER, Oral) Active. Myfortic (180MG  Tablet DR, Oral) Active. Sensipar (30MG  Tablet, Oral) Active. Colace (100MG  Capsule, Oral) Active. Prograf (1MG  Capsule, Oral) Active.  Social History  Alcohol use  Occasional alcohol use. Caffeine use  Carbonated beverages. No drug use  Tobacco use  Never smoker.  Vitals  Weight: 146 lb Height:  65in Body Surface Area: 1.73 m Body Mass Index: 24.3 kg/m  Pulse: 92 (Regular)  BP: 108/70 (Sitting, Left Arm, Standard) Physical Exam  General Mental Status-Alert. Orientation-Oriented X3. Chest and Lung Exam Chest and lung exam reveals -quiet, even and easy respiratory effort with no use of accessory muscles and on auscultation, normal breath sounds, no adventitious sounds and normal vocal resonance. Breast Nipples-No Discharge. Breast Lump-No Palpable Breast Mass. Cardiovascular Cardiovascular examination reveals -normal heart sounds, regular rate and rhythm with no murmurs. Lymphatic Head & Neck General Head & Neck Lymphatics: Bilateral - Description - Normal. Axillary General Axillary Region: Bilateral - Description - Normal. Note: no [place] adenopathy  Assessment & Plan BREAST CANCER OF LOWER-OUTER QUADRANT OF LEFT FEMALE BREAST (C50.512) Story: left breast seed bracketed lumpectomy, left axillary sn biopsy, port removal We discussed the staging and pathophysiology of breast cancer. We discussed all of the different options for treatment for breast cancer including surgery, chemotherapy, radiation therapy, Herceptin, and antiestrogen therapy. We discussed a sentinel lymph node biopsy as she does not appear to having lymph node involvement right now. We discussed the performance of that with injection of radioactive tracer and blue dye. We discussed that there is a chance of having a positive node with a sentinel lymph node biopsy and we will await the permanent pathology to make any other first further decisions in terms of her treatment. We discussed up to a 5% risk lifetime of chronic shoulder pain as well as lymphedema associated with a sentinel lymph node biopsy. We  discussed the options for treatment of the breast cancer which included lumpectomy versus a mastectomy. We discussed the performance of the lumpectomy with radioactive seeds. We discussed a 10-20%  chance of a positive margin requiring reexcision in the operating room. We also discussed that she will need radiation therapy if she undergoes lumpectomy. We discussed the mastectomy (removal of whole breast) and the postoperative care for that as well. Mastectomy can be followed by reconstruction. This is a more extensive surgery and requires more recovery. Most mastectomy patients will not need radiation therapy. We discussed that there is no difference in her survival whether she undergoes lumpectomy with radiation therapy or antiestrogen therapy versus a mastectomy. There is also no real difference between her recurrence in the breast. We discussed the risks of operation including bleeding, infection, possible reoperation. She understands her further therapy will be based on what her stages at the time of her operation.

## 2016-11-14 NOTE — Transfer of Care (Signed)
Immediate Anesthesia Transfer of Care Note  Patient: Erika Freeman  Procedure(s) Performed: Procedure(s): LEFT BREAST RADIOACTIVE SEED BRACKETED LUMPECTOMY, LEFT AXILLARY SENTINEL LYMPH NODE BIOPSY WITH BLUE DYE INJECTION (Left) REMOVAL PORT-A-CATH (Right)  Patient Location: PACU  Anesthesia Type:General and Regional  Level of Consciousness: unresponsive and drowsy  Airway & Oxygen Therapy: Patient Spontanous Breathing and Patient connected to face mask oxygen  Post-op Assessment: Report given to RN and Post -op Vital signs reviewed and stable  Post vital signs: Reviewed and stable  Last Vitals:  Vitals:   11/14/16 0752 11/14/16 1107  BP: (!) 150/93 (!) 141/64  Pulse: 82 89  Resp: 18 14  Temp: 36.8 C 36.4 C  SpO2: 98% 99%    Last Pain:  Vitals:   11/14/16 0752  TempSrc: Oral         Complications: No apparent anesthesia complications

## 2016-11-14 NOTE — Discharge Instructions (Signed)
[facility] °Office Phone Number [phone] ° °BREAST BIOPSY/ PARTIAL MASTECTOMY: POST OP INSTRUCTIONS ° °Always review your discharge instruction sheet given to you by the facility where your surgery was performed. ° °IF YOU HAVE DISABILITY OR FAMILY LEAVE FORMS, YOU MUST BRING THEM TO THE OFFICE FOR PROCESSING.  DO NOT GIVE THEM TO YOUR DOCTOR. ° °1. A prescription for pain medication may be given to you upon discharge.  Take your pain medication as prescribed, if needed.  If narcotic pain medicine is not needed, then you may take acetaminophen (Tylenol), naprosyn (Alleve) or ibuprofen (Advil) as needed. °2. Take your usually prescribed medications unless otherwise directed °3. If you need a refill on your pain medication, please contact your pharmacy.  They will contact our office to request authorization.  Prescriptions will not be filled after 5pm or on week-ends. °4. You should eat very light the first 24 hours after surgery, such as soup, crackers, pudding, etc.  Resume your normal diet the day after surgery. °5. Most patients will experience some swelling and bruising in the breast.  Ice packs and a good support bra will help.  Wear the breast binder provided or a sports bra for 72 hours day and night.  After that wear a sports bra during the day until you return to the office. Swelling and bruising can take several days to resolve.  °6. It is common to experience some constipation if taking pain medication after surgery.  Increasing fluid intake and taking a stool softener will usually help or prevent this problem from occurring.  A mild laxative (Milk of Magnesia or Miralax) should be taken according to package directions if there are no bowel movements after 48 hours. °7. Unless discharge instructions indicate otherwise, you may remove your bandages 48 hours after surgery and you may shower at that time.  You may have steri-strips (small skin tapes) in place directly over the incision.   These strips should be left on the skin for 7-10 days and will come off on their own.  If your surgeon used skin glue on the incision, you may shower in 24 hours.  The glue will flake off over the next 2-3 weeks.  Any sutures or staples will be removed at the office during your follow-up visit. °8. ACTIVITIES:  You may resume regular daily activities (gradually increasing) beginning the next day.  Wearing a good support bra or sports bra minimizes pain and swelling.  You may have sexual intercourse when it is comfortable. °a. You may drive when you no longer are taking prescription pain medication, you can comfortably wear a seatbelt, and you can safely maneuver your car and apply brakes. °b. RETURN TO WORK:  ______________________________________________________________________________________ °9. You should see your doctor in the office for a follow-up appointment approximately two weeks after your surgery.  Your doctor’s nurse will typically make your follow-up appointment when she calls you with your pathology report.  Expect your pathology report 3-4 business days after your surgery.  You may call to check if you do not hear from us after three days. °10. OTHER INSTRUCTIONS: _______________________________________________________________________________________________ _____________________________________________________________________________________________________________________________________ °_____________________________________________________________________________________________________________________________________ °_____________________________________________________________________________________________________________________________________ ° °WHEN TO CALL DR Lashea Goda: °1. Fever over 101.0 °2. Nausea and/or vomiting. °3. Extreme swelling or bruising. °4. Continued bleeding from incision. °5. Increased pain, redness, or drainage from the incision. ° °The clinic staff is available to  answer your questions during regular business hours.  Please don’t hesitate to call and ask to speak to one of the nurses for   clinical concerns.  If you have a medical emergency, go to the nearest emergency room or call 911.  A surgeon from [facility] is always on call at the hospital. ° °For further questions, please visit [email or web address] ° °

## 2016-11-14 NOTE — Anesthesia Procedure Notes (Signed)
Anesthesia Regional Block: Pectoralis block   Pre-Anesthetic Checklist: ,, timeout performed, Correct Patient, Correct Site, Correct Laterality, Correct Procedure, Correct Position, site marked, Risks and benefits discussed,  Surgical consent,  Pre-op evaluation,  At surgeon's request and post-op pain management  Laterality: Left  Prep: chloraprep       Needles:  Injection technique: Single-shot  Needle Type: Echogenic Needle     Needle Length: 9cm  Needle Gauge: 21     Additional Needles:   Narrative:  Start time: 11/14/2016 8:52 AM End time: 11/14/2016 8:58 AM Injection made incrementally with aspirations every 5 mL.  Performed by: Personally  Anesthesiologist: Renold Don E  Additional Notes: No pain on injection. No increased resistance to injection. Injection made in 5cc increments. Good needle visualization. Patient tolerated the procedure well.

## 2016-11-14 NOTE — Op Note (Signed)
5:03 PM       Preoperative diagnosis: clinical stage II left breast cancer s/p primary chemotherapy Postoperative diagnosis: same as above Procedure: Leftbreast seed bracketed guided lumpectomy Leftdeep axillary sentinel node biopsy Injection blue dye for sentinel node identification Port removal Surgeon: Dr Serita Grammes EBL: 30 cc Anes: general  Specimens  1. Left breast tissue marked with paint containing one seed and both clips 2. Leftaxillary sentinel nodes with highest count 1139, one with blue dye also 3. Second radioactive seed separate Complications none Drains none Sponge count correct Dispo to pacu stable  Indications: This is a 46 yof with clinical stage II left breast cancer who has undergone primary chemotherapy.  She has good response by mm (no mri due to renal transplant).  We discussed attempt at breast conservation therapy with seed bracketed lumpectomy and sentinel node biopsy. She would also like port removed.   Procedure: After informed consent was obtained the patient was taken to the operating room. She first was given technetium in standard periareolar fashion. She had a pectoral block. She was given antibiotics. Sequential compression devices were on her legs. She was then placed under general anesthesia with an LMA. Then she was prepped and draped in the standard sterile surgical fashion. Surgical timeout was then performed.  I first infiltrated marcaine in the right upper chest. I reentered this incision and removed the port and the line in their entirety.  Hemostasis was observed. All suture material was removed.  I then closed with 3-0 vicryl, 4-0 monocryl and dermabond. I then infiltrated a mixture of blue dye and saline in the periareolar area of the left breast and massaged it for later sentinel node identification. I then located the seed in the lower inner leftbreast. I infiltrated marcaine in the skin and then made an inframammary incision in  an attempt to hide the scar later. I tunneled to the seed and the mass using the lighted retractor. The deep margin is the muscle. I then used the neoprobe to remove the seeds and the surrounding tissue with attempt to get clear margins. One seed was right at margin of excision and I took this out separately to maintain control. I marked this with paint. MM confirmed removal of seeds and clips.This was confirmed by radiology. I placed clips in the cavity.I then obtained hemostasis. I closed this with 2-0 vicryl, 3-0 vicryl and 4-0 monocryl. Glue and steristrips were applied.   I then made an incision in the axilla and carried this through the axillary fascia. I then located the sentinel nodes. I excised the radioactive nodes. One of these was also blue. There were no enlarged nodes. The background radioactivity was zero. I then obtained hemostasis. I closed the fascia with 2-0 vicryl. The skin was closed with 3-0 vicryl and 4-0 monocryl. Glue and steristrips were applied. A binder was placed. She was extubated and transferred to PACU.

## 2016-11-14 NOTE — Anesthesia Postprocedure Evaluation (Signed)
Anesthesia Post Note  Patient: Erika Freeman  Procedure(s) Performed: Procedure(s) (LRB): LEFT BREAST RADIOACTIVE SEED BRACKETED LUMPECTOMY, LEFT AXILLARY SENTINEL LYMPH NODE BIOPSY WITH BLUE DYE INJECTION (Left) REMOVAL PORT-A-CATH (Right)     Patient location during evaluation: PACU Anesthesia Type: General Level of consciousness: awake and alert Pain management: pain level controlled Vital Signs Assessment: post-procedure vital signs reviewed and stable Respiratory status: spontaneous breathing, nonlabored ventilation, respiratory function stable and patient connected to nasal cannula oxygen Cardiovascular status: blood pressure returned to baseline and stable Postop Assessment: no apparent nausea or vomiting Anesthetic complications: no    Last Vitals:  Vitals:   11/14/16 1230 11/14/16 1254  BP:  (!) 150/95  Pulse: 88 95  Resp: 10 16  Temp:    SpO2: 94% 97%    Last Pain:  Vitals:   11/14/16 1220  TempSrc:   PainSc: 2                  Audry Pili

## 2016-11-14 NOTE — Anesthesia Procedure Notes (Signed)
Procedure Name: LMA Insertion Date/Time: 11/14/2016 9:39 AM Performed by: Orlie Dakin Pre-anesthesia Checklist: Patient identified, Emergency Drugs available, Suction available, Patient being monitored and Timeout performed Patient Re-evaluated:Patient Re-evaluated prior to induction Oxygen Delivery Method: Circle system utilized Preoxygenation: Pre-oxygenation with 100% oxygen Induction Type: IV induction Ventilation: Mask ventilation without difficulty LMA: LMA inserted LMA Size: 4.0 Tube type: Oral Number of attempts: 1 Placement Confirmation: positive ETCO2 and breath sounds checked- equal and bilateral Tube secured with: Tape Dental Injury: Teeth and Oropharynx as per pre-operative assessment

## 2016-11-15 ENCOUNTER — Encounter (HOSPITAL_COMMUNITY): Payer: Self-pay | Admitting: General Surgery

## 2016-11-25 ENCOUNTER — Other Ambulatory Visit: Payer: Self-pay | Admitting: Oncology

## 2016-11-25 DIAGNOSIS — E212 Other hyperparathyroidism: Secondary | ICD-10-CM | POA: Diagnosis not present

## 2016-11-25 DIAGNOSIS — Z5181 Encounter for therapeutic drug level monitoring: Secondary | ICD-10-CM | POA: Diagnosis not present

## 2016-11-25 DIAGNOSIS — I120 Hypertensive chronic kidney disease with stage 5 chronic kidney disease or end stage renal disease: Secondary | ICD-10-CM | POA: Diagnosis not present

## 2016-11-25 DIAGNOSIS — Z853 Personal history of malignant neoplasm of breast: Secondary | ICD-10-CM | POA: Diagnosis not present

## 2016-11-25 DIAGNOSIS — Z9221 Personal history of antineoplastic chemotherapy: Secondary | ICD-10-CM | POA: Diagnosis not present

## 2016-11-25 DIAGNOSIS — Z940 Kidney transplant status: Secondary | ICD-10-CM | POA: Diagnosis not present

## 2016-11-25 DIAGNOSIS — Z79899 Other long term (current) drug therapy: Secondary | ICD-10-CM | POA: Diagnosis not present

## 2016-11-25 DIAGNOSIS — Z9889 Other specified postprocedural states: Secondary | ICD-10-CM | POA: Diagnosis not present

## 2016-11-25 DIAGNOSIS — Z992 Dependence on renal dialysis: Secondary | ICD-10-CM | POA: Diagnosis not present

## 2016-11-25 DIAGNOSIS — N186 End stage renal disease: Secondary | ICD-10-CM | POA: Diagnosis not present

## 2016-11-25 DIAGNOSIS — Z4822 Encounter for aftercare following kidney transplant: Secondary | ICD-10-CM | POA: Diagnosis not present

## 2016-11-25 DIAGNOSIS — I151 Hypertension secondary to other renal disorders: Secondary | ICD-10-CM | POA: Diagnosis not present

## 2016-11-25 DIAGNOSIS — I10 Essential (primary) hypertension: Secondary | ICD-10-CM | POA: Diagnosis not present

## 2016-11-25 DIAGNOSIS — D899 Disorder involving the immune mechanism, unspecified: Secondary | ICD-10-CM | POA: Diagnosis not present

## 2016-11-25 DIAGNOSIS — Z23 Encounter for immunization: Secondary | ICD-10-CM | POA: Diagnosis not present

## 2016-11-25 NOTE — Progress Notes (Signed)
Location of Breast Cancer: lower-inner quadrant of left breast   Histology per Pathology Report:   11/14/16 Diagnosis 1. Breast, lumpectomy, Left INVASIVE DUCTAL CARCINOMA, POST NEOADJUVANT THERAPY, 0.2 MM ALL MARGINS OF RESECTION ARE NEGATIVE FOR CARCINOMA PREVIOUS BIOPSY SITES PRESENT 2. Lymph node, sentinel, biopsy, Left axillary ONE BENIGN LYMPH NODE (0/1)  06/02/16 Diagnosis Breast, left, needle core biopsy, 6:30 o'clock - INVASIVE DUCTAL CARCINOMA. - DUCTAL CARCINOMA IN SITU. - SEE COMMENT. Microscopic Comment The carcinoma appears grade 3. A breast prognostic profile will be performed and the results reported separately. The results were called to [facility] on 06/03/2016. ([facility]:ah 06/03/16)  Receptor Status: ER(20%), PR (0%), Her2-neu (negative), Ki-(70%)  Did patient present with symptoms (if so, please note symptoms) or was this found on screening mammography?: patient felt a lump herself  Past/Anticipated interventions by surgeon, if any: 11/14/16 - Procedure: LEFT BREAST RADIOACTIVE SEED BRACKETED LUMPECTOMY, LEFT AXILLARY SENTINEL LYMPH NODE BIOPSY WITH BLUE DYE INJECTION;  Surgeon: [name], MD  Past/Anticipated interventions by medical oncology, if any: neoadjuvant chemotherapy consisting of doxorubicin and cyclophosphamide in dose dense fashion 4 started 06/01/2016, completed 07/12/2016  followed by Paclitaxel weekly 12 started 07/26/2016, completed 10/11/2016  Lymphedema issues, if any:  no   Pain issues, if any:  no   SAFETY ISSUES:  Prior radiation? no  Pacemaker/ICD? no  Possible current pregnancy?no  Is the patient on methotrexate? no  Current Complaints / other details:  Patient has had a kidney transplant.  Patient is here with her husband.  BP (!) 136/94 (BP Location: Right Arm, Patient Position: Sitting)   Pulse 79   Temp 98.8 F (37.1 C) (Oral)   Ht _0  (1.651 m)   Wt 154 lb (69.9 kg)   LMP 04/27/2016 (Exact  Date)   SpO2 99%   BMI 25.63 kg/m    Wt Readings from Last 3 Encounters:  11/28/16 154 lb (69.9 kg)  11/14/16 151 lb (68.5 kg)  11/08/16 151 lb 3 oz (68.6 kg)      Hess, Craige Cotta, RN 11/25/2016,8:31 AM

## 2016-11-28 ENCOUNTER — Ambulatory Visit
Admission: RE | Admit: 2016-11-28 | Discharge: 2016-11-28 | Disposition: A | Discharge status: Home / Self Care | Payer: Medicare Other | Source: Ambulatory Visit | Attending: Radiation Oncology | Admitting: Radiation Oncology

## 2016-11-28 ENCOUNTER — Encounter: Payer: Self-pay | Admitting: Radiation Oncology

## 2016-11-28 DIAGNOSIS — C50312 Malignant neoplasm of lower-inner quadrant of left female breast: Secondary | ICD-10-CM

## 2016-11-28 DIAGNOSIS — Z9221 Personal history of antineoplastic chemotherapy: Secondary | ICD-10-CM | POA: Insufficient documentation

## 2016-11-28 DIAGNOSIS — Z7982 Long term (current) use of aspirin: Secondary | ICD-10-CM | POA: Insufficient documentation

## 2016-11-28 DIAGNOSIS — Z170 Estrogen receptor positive status [ER+]: Secondary | ICD-10-CM | POA: Insufficient documentation

## 2016-11-28 DIAGNOSIS — Z9889 Other specified postprocedural states: Secondary | ICD-10-CM | POA: Diagnosis not present

## 2016-11-28 DIAGNOSIS — Z79899 Other long term (current) drug therapy: Secondary | ICD-10-CM | POA: Insufficient documentation

## 2016-11-28 DIAGNOSIS — Z510 Encounter for antineoplastic radiation therapy: Secondary | ICD-10-CM | POA: Diagnosis not present

## 2016-11-28 DIAGNOSIS — Z7952 Long term (current) use of systemic steroids: Secondary | ICD-10-CM | POA: Diagnosis not present

## 2016-11-28 NOTE — Progress Notes (Signed)
Radiation Oncology         [phone] ________________________________  Name: Erika Freeman MRN: [id number]  Date: 11/28/2016  DOB: Jan 03, 1961    Re-evaluation Note  CC: [name], MD  Magrinat, [name], MD    ICD-10-CM   1. Malignant neoplasm of lower-inner quadrant of left breast in female, estrogen receptor positive ([place]) C50.312    Z17.0     Diagnosis:   ypT1a, ypN0, cM0, G3, ER: Positive, PR: Negative, HER2: Negative,  Invasive ductal carcinoma of the left breast   Narrative:  The patient returns today for further evaluation. The patient was seen in the multidisciplinary breast clinic on a on 05/18/2016 along with Dr. Jana Hakim and Dr. Donne Hazel. Patient did undergo a biopsy of the satellite lesion in the inferior aspect of the breast confirming malignancy.  The Patient then proceeded to undergo neoadjuvant chemotherapy. The patient received4 cycles of doxorubicin and cyclophosphamide in a dose dense fashion. She then proceeded with weekly paclitaxel for 12 cycles. chemotherapy  was completed on 10/11/2016. Patient proceeded to undergo her definitive surgery with Dr. Donne Hazel on September 17th. The patient was able to undergo breast conservation surgery with a left lumpectomy and sentinel node procedure. Patient was found to have a residual 0.2 mm invasive ductal carcinoma. There is no metastatic spread to the sentinel lymph node. The surgical margins were clear with the closest margin being 1 cm that being the lateral margin. There was no evidence of noninvasive breast cancer on the excisional biopsy. Patient has been doing well since her surgery. She is now seen in radiation oncology for consideration for radiation therapy as part of breast conservation treatment.                           ALLERGIES:  has No Known Allergies.  Meds: Current Outpatient Prescriptions  Medication Sig Dispense Refill  . acetaminophen (TYLENOL) 500 MG tablet Take 1,000 mg by mouth every 6  (six) hours as needed.    [name] amLODipine (NORVASC) 5 MG tablet Take 5 mg by mouth daily. Takes 1/2 tablet daily    . aspirin 81 MG tablet Take 81 mg by mouth every morning.     . cinacalcet (SENSIPAR) 30 MG tablet Take 30 mg by mouth Monday through Friday (not on Saturday and Sunday)    . diphenhydrAMINE (BENADRYL) 25 MG tablet Take 25 mg by mouth daily as needed for allergies.    . Iron, Ferrous Sulfate, 142 (45 Fe) MG TBCR Take 1 tablet by mouth every morning.     . lovastatin (MEVACOR) 20 MG tablet Take 20 mg by mouth every Monday, Wednesday, and Friday. Takes at bedtime.    . predniSONE (DELTASONE) 5 MG tablet Take 5 mg by mouth every morning.     . tacrolimus (PROGRAF) 1 MG capsule Take 2-3 mg by mouth 2 (two) times daily. Takes 78m in the AM and 262min the PM.     No current facility-administered medications for this encounter.     Physical Findings: The patient is in no acute distress. Patient is alert and oriented.  Accompanied by her husband on evaluation today  height is 5' 5"  (1.651 m) and weight is 154 lb (69.9 kg). Her oral temperature is 98.8 F (37.1 C). Her blood pressure is 136/94 (abnormal) and her pulse is 79. Her oxygen saturation is 99%. .  Lungs are clear to auscultation bilaterally. Heart has regular rate and rhythm. No  palpable cervical, supraclavicular, or axillary adenopathy. Abdomen soft, non-tender, normal bowel sounds.  The right breast area shows no palpable mass or nipple discharge. There is a small scar in the upper right chest from her recent Port-A-Cath removal which is healing well without signs of drainage or infection. The left breast area shows a scar in the inframammary fold area which is healing well without signs of drainage or infection. A separate scar is noted in the left axillary area from her sentinel node procedure area.  surgical changes are noted along the inferior aspect of the the areolar from her prior benign surgery performed approximately 3-5 years  ago.  Lab Findings: Lab Results  Component Value Date   WBC 11.2 (H) 11/08/2016   HGB 12.6 11/08/2016   HCT 40.1 11/08/2016   MCV 90.9 11/08/2016   PLT 175 11/08/2016    Radiographic Findings: Mm Breast Surgical Specimen  Result Date: 11/14/2016 CLINICAL DATA:  Patient status post left breast lumpectomy, 2 sites. EXAM: SPECIMEN RADIOGRAPH OF THE LEFT BREAST COMPARISON:  Previous exam(s). FINDINGS: Status post excision of the left breast. The radioactive seed and biopsy marking clips (ribbon shaped and coil shaped) are present, completely intact, and were marked for pathology. Note the other radioactive seed is located on a different specimen. IMPRESSION: Specimen radiograph of the left breast. Electronically Signed   By: Lovey Newcomer M.D.   On: 11/14/2016 12:03   Mm Breast Surgical Specimen  Result Date: 11/14/2016 CLINICAL DATA:  Patient status post left breast lumpectomy. EXAM: SPECIMEN RADIOGRAPH OF THE LEFT BREAST COMPARISON:  Previous exam(s). FINDINGS: Status post excision of the left breast. The radioactive seed is present, completely intact, and were marked for pathology. The biopsy marking clip is located on the second specimen. IMPRESSION: Specimen radiograph of the left breast. Electronically Signed   By: Lovey Newcomer M.D.   On: 11/14/2016 12:02   Mm Lt Radioactive Seed Loc Mammo Guide  Result Date: 11/10/2016 CLINICAL DATA:  56 year old female for radioactive seed localization of the 2 left breast biopsy sites demonstrating invasive ductal carcinoma. EXAM: MAMMOGRAPHIC GUIDED RADIOACTIVE SEED LOCALIZATION OF THE LEFT BREAST COMPARISON:  Previous exam(s). FINDINGS: Patient presents for radioactive seed localization prior to left lumpectomy. I met with the patient and we discussed the procedure of seed localization including benefits and alternatives. We discussed the high likelihood of a successful procedure. We discussed the risks of the procedure including infection, bleeding,  tissue injury and further surgery. We discussed the low dose of radioactivity involved in the procedure. Informed, written consent was given. The usual time-out protocol was performed immediately prior to the procedure. 1) Using mammographic guidance, sterile technique, 1% lidocaine and an I-125 radioactive seed, the ribbon shaped biopsy marker in lower, inner left breast was localized using a medial to lateral approach. The follow-up mammogram images confirm the seed in the expected location and were marked for Dr. Donne Hazel. Follow-up survey of the patient confirms presence of the radioactive seed. Order number of I-125 seed:  [id number]. Total activity:  0.252 mCi  reference Date: October 13, 2016 2) Using mammographic guidance, sterile technique, 1% lidocaine and an I-125 radioactive seed, the coil shaped biopsy marker in the lower, inner left breast was localized using a medial to lateral approach. The follow-up mammogram images confirm the seed in the expected location and were marked for Dr. Donne Hazel. Follow-up survey of the patient confirms presence of the radioactive seed. Order number of I-125 seed:  [id number]. Total activity:  0.252 mCi  reference Date: October 13, 2016 The patient tolerated the procedure well and was released from the [facility]. She was given instructions regarding seed removal. IMPRESSION: Radioactive seed localization of the 2 left breast biopsy sites demonstrating invasive ductal carcinoma. No apparent complications. Electronically Signed   By: Pamelia Hoit M.D.   On: 11/10/2016 14:53   Mm Lt Rad Seed Ea Add Lesion Loc Mammo  Result Date: 11/10/2016 CLINICAL DATA:  56 year old female for radioactive seed localization of the 2 left breast biopsy sites demonstrating invasive ductal carcinoma. EXAM: MAMMOGRAPHIC GUIDED RADIOACTIVE SEED LOCALIZATION OF THE LEFT BREAST COMPARISON:  Previous exam(s). FINDINGS: Patient presents for radioactive seed localization prior to left lumpectomy.  I met with the patient and we discussed the procedure of seed localization including benefits and alternatives. We discussed the high likelihood of a successful procedure. We discussed the risks of the procedure including infection, bleeding, tissue injury and further surgery. We discussed the low dose of radioactivity involved in the procedure. Informed, written consent was given. The usual time-out protocol was performed immediately prior to the procedure. 1) Using mammographic guidance, sterile technique, 1% lidocaine and an I-125 radioactive seed, the ribbon shaped biopsy marker in lower, inner left breast was localized using a medial to lateral approach. The follow-up mammogram images confirm the seed in the expected location and were marked for Dr. Donne Hazel. Follow-up survey of the patient confirms presence of the radioactive seed. Order number of I-125 seed:  [id number]. Total activity:  0.252 mCi  reference Date: October 13, 2016 2) Using mammographic guidance, sterile technique, 1% lidocaine and an I-125 radioactive seed, the coil shaped biopsy marker in the lower, inner left breast was localized using a medial to lateral approach. The follow-up mammogram images confirm the seed in the expected location and were marked for Dr. Donne Hazel. Follow-up survey of the patient confirms presence of the radioactive seed. Order number of I-125 seed:  [id number]. Total activity:  0.252 mCi  reference Date: October 13, 2016 The patient tolerated the procedure well and was released from the [facility]. She was given instructions regarding seed removal. IMPRESSION: Radioactive seed localization of the 2 left breast biopsy sites demonstrating invasive ductal carcinoma. No apparent complications. Electronically Signed   By: Pamelia Hoit M.D.   On: 11/10/2016 14:53    Impression:  ypT1a, ypN0, cM0, G3, ER: Positive, PR: Negative, HER2: Negative, Invasive ductal carcinoma of the left breast. The patient has had an excellent  response to her neoadjuvant chemotherapy She was able to undergo breast conserving surgery with a partial mastectomy and sentinel node procedure.  She would be a good candidate for adjuvant radiation therapy. I discussed the course of treatment side effects and potential long-term toxicities of radiation therapy in this situation with the patient and her husband. She appears to understand and wishes to proceed with planned course of treatment.   Plan:  Patient is scheduled for CT simulation on October 15 at 8 AM. Her treatments will begin the week of October 22 approximately 5 weeks out from her surgery. Anticipate between 4 and 5.5 weeks of radiation therapy.  ____________________________________ [name], MD

## 2016-11-28 NOTE — Progress Notes (Signed)
Please see the Nurse Progress Note in the MD Initial Consult Encounter for this patient. 

## 2016-12-06 ENCOUNTER — Ambulatory Visit (HOSPITAL_BASED_OUTPATIENT_CLINIC_OR_DEPARTMENT_OTHER): Payer: Medicare Other | Admitting: Oncology

## 2016-12-06 ENCOUNTER — Other Ambulatory Visit (HOSPITAL_BASED_OUTPATIENT_CLINIC_OR_DEPARTMENT_OTHER): Payer: Medicare Other

## 2016-12-06 VITALS — BP 142/103 | HR 81 | Temp 98.5°F | Resp 18 | Ht 65.0 in | Wt 154.6 lb

## 2016-12-06 DIAGNOSIS — Z170 Estrogen receptor positive status [ER+]: Secondary | ICD-10-CM | POA: Diagnosis not present

## 2016-12-06 DIAGNOSIS — C50312 Malignant neoplasm of lower-inner quadrant of left female breast: Secondary | ICD-10-CM

## 2016-12-06 LAB — COMPREHENSIVE METABOLIC PANEL
ALBUMIN: 4.2 g/dL (ref 3.5–5.0)
ALK PHOS: 79 U/L (ref 40–150)
ALT: 14 U/L (ref 0–55)
AST: 16 U/L (ref 5–34)
Anion Gap: 9 mEq/L (ref 3–11)
BUN: 12.4 mg/dL (ref 7.0–26.0)
CALCIUM: 10.3 mg/dL (ref 8.4–10.4)
CHLORIDE: 105 meq/L (ref 98–109)
CO2: 31 mEq/L — ABNORMAL HIGH (ref 22–29)
Creatinine: 1 mg/dL (ref 0.6–1.1)
EGFR: 70 mL/min/{1.73_m2} — AB (ref >90)
GLUCOSE: 104 mg/dL (ref 70–140)
POTASSIUM: 3.2 meq/L — AB (ref 3.5–5.1)
SODIUM: 145 meq/L (ref 136–145)
Total Bilirubin: 0.38 mg/dL (ref 0.20–1.20)
Total Protein: 7.5 g/dL (ref 6.4–8.3)

## 2016-12-06 LAB — CBC WITH DIFFERENTIAL/PLATELET
BASO%: 0.4 % (ref 0.0–2.0)
BASOS ABS: 0 10*3/uL (ref 0.0–0.1)
EOS ABS: 0.4 10*3/uL (ref 0.0–0.5)
EOS%: 4.6 % (ref 0.0–7.0)
HCT: 40.8 % (ref 34.8–46.6)
HGB: 13.4 g/dL (ref 11.6–15.9)
LYMPH%: 7.7 % — ABNORMAL LOW (ref 14.0–49.7)
MCH: 28.5 pg (ref 25.1–34.0)
MCHC: 33 g/dL (ref 31.5–36.0)
MCV: 86.6 fL (ref 79.5–101.0)
MONO#: 0.7 10*3/uL (ref 0.1–0.9)
MONO%: 7.1 % (ref 0.0–14.0)
NEUT#: 7.7 10*3/uL — ABNORMAL HIGH (ref 1.5–6.5)
NEUT%: 80.2 % — AB (ref 38.4–76.8)
Platelets: 181 10*3/uL (ref 145–400)
RBC: 4.71 10*6/uL (ref 3.70–5.45)
RDW: 14 % (ref 11.2–14.5)
WBC: 9.6 10*3/uL (ref 3.9–10.3)
lymph#: 0.7 10*3/uL — ABNORMAL LOW (ref 0.9–3.3)

## 2016-12-06 NOTE — Progress Notes (Signed)
[facility]  [phone] [phone]     ID: Erika Freeman DOB: 02/18/1961  MR#: [id number]  CSN#:[id number]  Patient Care Team: Neal, W Ronald, MD as PCP - General (Obstetrics and Gynecology) Powell, Alvin C, MD as Consulting Physician (Nephrology) Neal, W Ronald, MD as Consulting Physician (Obstetrics and Gynecology) Wakefield, Matthew, MD as Consulting Physician (General Surgery) ,  C, MD as Consulting Physician (Oncology) Kinard, James, MD as Consulting Physician (Radiation Oncology) Farney, Alan, MD as Referring Physician (Surgery) Reeves-Daniel, Amber, DO (Internal Medicine) OTHER MD:  CHIEF COMPLAINT: Estrogen receptor positive breast cancer  CURRENT TREATMENT: Adjuvant radiation pending  INTERVAL HISTORY: Erika Freeman returns today with her husband, for follow-up and treatment of her estrogen receptor positive breast cancer. Since her last visit here she completed her radiation treatments, her final, 12th dose of paclitaxel being 10/11/2016. She then proceeded to definitive surgery 11/14/2016. The final pathology ([id number]) showed a 0.2 mm area of residual invasive ductal carcinoma, with negative margins. The single sentinel lymph node was clear.  She then met with radiation oncology and is scheduled for simulation 12/12/2016.   REVIEW OF SYSTEMS: Baylie reports her finger nails haven't quite normalized since ending chemo, however, her appetite has improved. Pt is staying well hydrated. She still endorses intermittent fatigue. Per pt's husband, she has been walking a little more, and she notes that once the weather cools down she will start to walk more. Her biggest concern is her elevated BP since ending chemo. She has ben only taking half of her prescribed blood pressure medication and she thinks it isn't enough. She denies unusual headaches, visual changes, nausea, vomiting, or dizziness. There has been no unusual cough,  phlegm production, or pleurisy. This been no change in bowel or bladder habits. She denies unexplained fatigue or unexplained weight loss, bleeding, rash, or fever. A detailed review of systems was otherwise negative.   BREAST CANCER HISTORY: From the original intake note:  "Erika Freeman" herself noted a mass in her left breast. She ignored it because she thought it was related to menstruation. However as it persisted through 1. She brought it to Dr. Neal's attention and on 05/10/2016 she underwent left diagnostic mammography with tomography and [facility] The breast density was category C. In the left breast lower central area there was a new circumscribed mass. This was firm and fixed in the lower inner left breast at middle depth. Ultrasonography confirmed a 2.6 cm hypoechoic irregular mass at the 6:30 o'clock radiant 3 cm from the nipple. There was also a 0.5 cm nodule 1.4 cm medial to the mass just described.  Biopsy of the left breast mass in question 05/10/2016 showed([id number]) invasive ductal carcinoma, grade 3, estrogen receptor 70% positive with weak staining intensity, progesterone receptor negative, with no HER-2 amplification, the signals ratio being 1.52 and the number per cell 2.35. The proliferation marker was 90%.  Her subsequent history is as detailed below  OF NOTE: The patient has a history of focal segmental glomerular sclerosis with end-stage renal disease and is status post unrelated donor renal transplant 04/15/2013 as part of appeared donor exchange with her husband donating one kidney, the other harvested from a 30-year-old [place] man. She continues on immunosuppression with mycophenolate and tacrolimus.  PAST MEDICAL HISTORY: Past Medical History:  Diagnosis Date  . Anemia   . Breast cancer (HCC)   . Cancer (HCC)    breast cancer  . Chills   . Chronic   kidney disease    resolved kidney transplant - [facility] - transplant -2015  .  Cough   . Fibroid tumor   . GERD (gastroesophageal reflux disease)    resolved   . Heart murmur   . Hypertension    DX  30 YRS AGO  . Personal history of chemotherapy   . Umbilical hernia s/p primary repair 01/10/2012 01/24/2012  . Vomiting     PAST SURGICAL HISTORY: Past Surgical History:  Procedure Laterality Date  . BREAST BIOPSY    . BREAST LUMPECTOMY WITH RADIOACTIVE SEED AND SENTINEL LYMPH NODE BIOPSY Left 11/14/2016   Procedure: LEFT BREAST RADIOACTIVE SEED BRACKETED LUMPECTOMY, LEFT AXILLARY SENTINEL LYMPH NODE BIOPSY WITH BLUE DYE INJECTION;  Surgeon: Wakefield, Matthew, MD;  Location: [facility] OR;  Service: General;  Laterality: Left;  . BREAST SURGERY  2012   left - fibroadenoma  . CAPD INSERTION  01/10/2012   Procedure: LAPAROSCOPIC INSERTION CONTINUOUS AMBULATORY PERITONEAL DIALYSIS  (CAPD) CATHETER;  Surgeon: Steven C. Gross, MD;  Location: [facility] OR;  Service: General;  Laterality: N/A;  LAPAROSCOPIC CAPD CATHETER PLACEMENT OMENTOPEXY  . CESAREAN SECTION  1996  . DILATION AND CURETTAGE OF UTERUS    . KIDNEY TRANSPLANT    . PARATHYROIDECTOMY  2000 - approximate  . PORT-A-CATH REMOVAL Right 11/14/2016   Procedure: REMOVAL PORT-A-CATH;  Surgeon: Wakefield, Matthew, MD;  Location: [facility] OR;  Service: General;  Laterality: Right;  . PORTACATH PLACEMENT Right 05/24/2016   Procedure: INSERTION PORT-A-CATH WITH US;  Surgeon: Wakefield, Matthew, MD;  Location: [facility] OR;  Service: General;  Laterality: Right;  . RENAL BIOPSY  2011  . TUBAL LIGATION    . UMBILICAL HERNIA REPAIR  01/10/2012   Procedure: HERNIA REPAIR UMBILICAL ADULT;  Surgeon: Steven C. Gross, MD;  Location: [facility] OR;  Service: General;  Laterality: N/A;    FAMILY HISTORY Family History  Problem Relation Age of Onset  . Cancer Maternal Uncle        lung  . Cancer Cousin        breast  . Cancer Cousin        colon  The patient has very little information regarding her father. Her mother is living, 72 years old as of March  2018. The patient had one full brother, diagnosed with prostate cancer in his 40s. The patient has 2 half-brothers and 1 half-sister. There is no history of breast or ovarian cancer in the family to the patient's knowledge   GYNECOLOGIC HISTORY:  Patient's last menstrual period was 04/27/2016 (exact date).  Menarche age 14, first live birth age 23, the patient is GX P3. Her periods are regular, the last 5 or 6 days, with no heavy days. 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 Her husband white works at home [employer]. Son Brandon lives in [place] and as manager of a student housing project. Son Ryan lives in [place] and is a maintenance technician. Son Jordan lives in [place] and is studying based trombone. The patient has 2 grandchildren. She attends a local [employer]     ADVANCED DIRECTIVES:  not in place    HEALTH MAINTENANCE: Social History  Substance Use Topics  . Smoking status: Never Smoker  . Smokeless tobacco: Never Used  . Alcohol use No     Colonoscopy:  PAP:  Bone density:   No Known Allergies  Current Outpatient Prescriptions  Medication Sig   Dispense Refill  . acetaminophen (TYLENOL) 500 MG tablet Take 1,000 mg by mouth every 6 (six) hours as needed.    [name] amLODipine (NORVASC) 5 MG tablet Take 5 mg by mouth daily. Takes 1/2 tablet daily    . aspirin 81 MG tablet Take 81 mg by mouth every morning.     . cinacalcet (SENSIPAR) 30 MG tablet Take 30 mg by mouth Monday through Friday (not on Saturday and Sunday)    . diphenhydrAMINE (BENADRYL) 25 MG tablet Take 25 mg by mouth daily as needed for allergies.    . Iron, Ferrous Sulfate, 142 (45 Fe) MG TBCR Take 1 tablet by mouth every morning.     . lovastatin (MEVACOR) 20 MG tablet Take 20 mg by mouth every Monday, Wednesday, and Friday. Takes at bedtime.    . predniSONE (DELTASONE) 5 MG tablet Take  5 mg by mouth every morning.     . tacrolimus (PROGRAF) 1 MG capsule Take 3 mg by mouth 2 (two) times daily. Takes 45m in the AM and 29min the PM.     No current facility-administered medications for this visit.     OBJECTIVE: Middle-aged African-American woman In no acute distress  Vitals:   12/06/16 1051  BP: (!) 142/103  Pulse: 81  Resp: 18  Temp: 98.5 F (36.9 C)  SpO2: 99%     Body mass index is 25.73 kg/m.   ECOG FS:1 - Symptomatic but completely ambulatory   Sclerae unicteric, EOMs intact Oropharynx clear and moist No cervical or supraclavicular adenopathy Lungs no rales or rhonchi Heart regular rate and rhythm Abd soft, nontender, positive bowel sounds MSK no focal spinal tenderness, no upper extremity lymphedema Neuro: nonfocal, well oriented, appropriate affect Breasts: The right breast is unremarkable. The left breast is status post recent lumpectomy. The incisions are healing very nicely. The cosmetic result is good. There is minimal subjacent induration. Both axillae are benign.   LAB RESULTS:  CMP     Component Value Date/Time   NA 145 12/06/2016 1015   K 3.2 (L) 12/06/2016 1015   CL 103 11/08/2016 1007   CO2 31 (H) 12/06/2016 1015   GLUCOSE 104 12/06/2016 1015   BUN 12.4 12/06/2016 1015   CREATININE 1.0 12/06/2016 1015   CALCIUM 10.3 12/06/2016 1015   PROT 7.5 12/06/2016 1015   ALBUMIN 4.2 12/06/2016 1015   AST 16 12/06/2016 1015   ALT 14 12/06/2016 1015   ALKPHOS 79 12/06/2016 1015   BILITOT 0.38 12/06/2016 1015   GFRNONAA >60 11/08/2016 1007   GFRAA >60 11/08/2016 1007    No results found for: TOTALPROTELP, ALBUMINELP, A1GS, A2GS, BETS, BETA2SER, GAMS, MSPIKE, SPEI  No results found for: [facility] Results  Component Value Date   WBC 9.6 12/06/2016   NEUTROABS 7.7 (H) 12/06/2016   HGB 13.4 12/06/2016   HCT 40.8 12/06/2016   MCV 86.6 12/06/2016   PLT 181 12/06/2016      Chemistry      Component  Value Date/Time   NA 145 12/06/2016 1015   K 3.2 (L) 12/06/2016 1015   CL 103 11/08/2016 1007   CO2 31 (H) 12/06/2016 1015   BUN 12.4 12/06/2016 1015   CREATININE 1.0 12/06/2016 1015      Component Value Date/Time   CALCIUM 10.3 12/06/2016 1015   ALKPHOS 79 12/06/2016 1015   AST 16 12/06/2016 1015   ALT 14 12/06/2016 1015   BILITOT 0.38 12/06/2016 1015  No results found for: LABCA2  No components found for: LABCAN125  No results for input(s): INR in the last 168 hours.  Urinalysis    Component Value Date/Time   COLORURINE YELLOW 11/24/2006 1539   APPEARANCEUR CLEAR 11/24/2006 1539   LABSPEC 1.018 11/24/2006 1539   PHURINE 6.0 11/24/2006 1539   GLUCOSEU NEGATIVE 11/24/2006 1539   HGBUR SMALL (A) 11/24/2006 1539   BILIRUBINUR NEGATIVE 11/24/2006 1539   KETONESUR NEGATIVE 11/24/2006 1539   PROTEINUR 100 (A) 11/24/2006 1539   UROBILINOGEN 1.0 11/24/2006 1539   NITRITE NEGATIVE 11/24/2006 1539   LEUKOCYTESUR MODERATE (A) 11/24/2006 1539     STUDIES: Mm Breast Surgical Specimen  Result Date: 11/14/2016 CLINICAL DATA:  Patient status post left breast lumpectomy, 2 sites. EXAM: SPECIMEN RADIOGRAPH OF THE LEFT BREAST COMPARISON:  Previous exam(s). FINDINGS: Status post excision of the left breast. The radioactive seed and biopsy marking clips (ribbon shaped and coil shaped) are present, completely intact, and were marked for pathology. Note the other radioactive seed is located on a different specimen. IMPRESSION: Specimen radiograph of the left breast. Electronically Signed   By: Drew  Davis M.D.   On: 11/14/2016 12:03   Mm Breast Surgical Specimen  Result Date: 11/14/2016 CLINICAL DATA:  Patient status post left breast lumpectomy. EXAM: SPECIMEN RADIOGRAPH OF THE LEFT BREAST COMPARISON:  Previous exam(s). FINDINGS: Status post excision of the left breast. The radioactive seed is present, completely intact, and were marked for pathology. The biopsy marking clip is  located on the second specimen. IMPRESSION: Specimen radiograph of the left breast. Electronically Signed   By: Drew  Davis M.D.   On: 11/14/2016 12:02   Mm Lt Radioactive Seed Loc Mammo Guide  Result Date: 11/10/2016 CLINICAL DATA:  56-year-old female for radioactive seed localization of the 2 left breast biopsy sites demonstrating invasive ductal carcinoma. EXAM: MAMMOGRAPHIC GUIDED RADIOACTIVE SEED LOCALIZATION OF THE LEFT BREAST COMPARISON:  Previous exam(s). FINDINGS: Patient presents for radioactive seed localization prior to left lumpectomy. I met with the patient and we discussed the procedure of seed localization including benefits and alternatives. We discussed the high likelihood of a successful procedure. We discussed the risks of the procedure including infection, bleeding, tissue injury and further surgery. We discussed the low dose of radioactivity involved in the procedure. Informed, written consent was given. The usual time-out protocol was performed immediately prior to the procedure. 1) Using mammographic guidance, sterile technique, 1% lidocaine and an I-125 radioactive seed, the ribbon shaped biopsy marker in lower, inner left breast was localized using a medial to lateral approach. The follow-up mammogram images confirm the seed in the expected location and were marked for Dr. Wakefield. Follow-up survey of the patient confirms presence of the radioactive seed. Order number of I-125 seed:  [id number]. Total activity:  0.252 mCi  reference Date: October 13, 2016 2) Using mammographic guidance, sterile technique, 1% lidocaine and an I-125 radioactive seed, the coil shaped biopsy marker in the lower, inner left breast was localized using a medial to lateral approach. The follow-up mammogram images confirm the seed in the expected location and were marked for Dr. Wakefield. Follow-up survey of the patient confirms presence of the radioactive seed. Order number of I-125 seed:  [id number]. Total  activity:  0.252 mCi  reference Date: October 13, 2016 The patient tolerated the procedure well and was [facility]. She was given instructions regarding seed removal. IMPRESSION: Radioactive seed localization of the 2 left breast biopsy sites demonstrating invasive ductal carcinoma.   No apparent complications. Electronically Signed   By: Pamelia Hoit M.D.   On: 11/10/2016 14:53   Mm Lt Rad Seed Ea Add Lesion Loc Mammo  Result Date: 11/10/2016 CLINICAL DATA:  56 year old female for radioactive seed localization of the 2 left breast biopsy sites demonstrating invasive ductal carcinoma. EXAM: MAMMOGRAPHIC GUIDED RADIOACTIVE SEED LOCALIZATION OF THE LEFT BREAST COMPARISON:  Previous exam(s). FINDINGS: Patient presents for radioactive seed localization prior to left lumpectomy. I met with the patient and we discussed the procedure of seed localization including benefits and alternatives. We discussed the high likelihood of a successful procedure. We discussed the risks of the procedure including infection, bleeding, tissue injury and further surgery. We discussed the low dose of radioactivity involved in the procedure. Informed, written consent was given. The usual time-out protocol was performed immediately prior to the procedure. 1) Using mammographic guidance, sterile technique, 1% lidocaine and an I-125 radioactive seed, the ribbon shaped biopsy marker in lower, inner left breast was localized using a medial to lateral approach. The follow-up mammogram images confirm the seed in the expected location and were marked for Dr. Donne Hazel. Follow-up survey of the patient confirms presence of the radioactive seed. Order number of I-125 seed:  [id number]. Total activity:  0.252 mCi  reference Date: October 13, 2016 2) Using mammographic guidance, sterile technique, 1% lidocaine and an I-125 radioactive seed, the coil shaped biopsy marker in the lower, inner left breast was localized using a medial to  lateral approach. The follow-up mammogram images confirm the seed in the expected location and were marked for Dr. Donne Hazel. Follow-up survey of the patient confirms presence of the radioactive seed. Order number of I-125 seed:  [id number]. Total activity:  0.252 mCi  reference Date: October 13, 2016 The patient tolerated the procedure well and was released from the [facility]. She was given instructions regarding seed removal. IMPRESSION: Radioactive seed localization of the 2 left breast biopsy sites demonstrating invasive ductal carcinoma. No apparent complications. Electronically Signed   By: Pamelia Hoit M.D.   On: 11/10/2016 14:53    ELIGIBLE FOR AVAILABLE RESEARCH PROTOCOL: no  ASSESSMENT: 56 y.o. [place], [place] woman status post left breast lower inner quadrant biopsy 05/10/2016 for a clinical  T2 N0, prognostic stage IIB invaside ductal carcinoma, grade 3, weekly estrogen receptor positive, progesterone receptor negative, with no HER-2 amplification, and the MIB-1 at 90%  (a) biopsy of the 0.6 mm satellite at the 6:30 o'clock radiant in the left breast 06/02/2016 showed invasive ductal carcinoma, grade 3, estrogen receptor 20% positive, with weak staining intensity, progesterone receptor negative with no HER-2 amplification, and an MIB-1 of 70% (identical to larger mass)   (1) neoadjuvant chemotherapy consisting of doxorubicin and cyclophosphamide in dose dense fashion 4 started 06/01/2016, completed 07/12/2016  followed by Paclitaxel weekly 12 started 07/26/2016, completed 10/11/2016  (2) definitive surgery 11/14/2016 found a residual pT1a pN0 area of invasive ductal carcinoma (0.2 mm), with negative margins.  (3) adjuvant radiation pending  (4) anti-estrogens to follow the completion of local treatment  (a) left heel DEXA scan 11/24/2004 was normal  PLAN:  Erika Freeman has almost completely recovered from her radiation treatments with no significant residuals. She had a very good surgical  outcome.  We discussed radiation today. She has a good understanding of the possible toxicities, side effects and complications of those treatments.  She is going to return to see me in December. Before that visit she will have a DEXA scan. That'll help [place] decide whether she  will be on anastrozole, which is most likely, or tamoxifen.  Her blood pressure is now returning to its baseline so I suggested she go ahead and return to her original medications with close follow-up from her nephrologist at [facility]  She knows to call for any other issues that may develop before the next visit.  [name], MD 12/06/16 11:11 AM Medical Oncology and Hematology [facility] [address] [place], [place] [address] Tel. [phone] Fax. [phone]  This document serves as a record of services personally performed by Lurline Del, MD. It was created on her behalf by Margit Banda, a trained medical scribe. The creation of this record is based on the scribe's personal observations and the provider's statements to them. This document has been checked and approved by the attending provider.

## 2016-12-07 ENCOUNTER — Telehealth: Payer: Self-pay | Admitting: Oncology

## 2016-12-07 NOTE — Telephone Encounter (Signed)
Spoke with patient regarding appt that was scheduled per 10/9 los

## 2016-12-12 ENCOUNTER — Ambulatory Visit
Admission: RE | Admit: 2016-12-12 | Discharge: 2016-12-12 | Disposition: A | Discharge status: Home / Self Care | Payer: Medicare Other | Source: Ambulatory Visit | Attending: Radiation Oncology | Admitting: Radiation Oncology

## 2016-12-12 DIAGNOSIS — Z7982 Long term (current) use of aspirin: Secondary | ICD-10-CM | POA: Diagnosis not present

## 2016-12-12 DIAGNOSIS — Z510 Encounter for antineoplastic radiation therapy: Secondary | ICD-10-CM | POA: Diagnosis not present

## 2016-12-12 DIAGNOSIS — C50312 Malignant neoplasm of lower-inner quadrant of left female breast: Secondary | ICD-10-CM | POA: Diagnosis not present

## 2016-12-12 DIAGNOSIS — Z9221 Personal history of antineoplastic chemotherapy: Secondary | ICD-10-CM | POA: Diagnosis not present

## 2016-12-12 DIAGNOSIS — Z170 Estrogen receptor positive status [ER+]: Secondary | ICD-10-CM | POA: Diagnosis not present

## 2016-12-12 DIAGNOSIS — Z79899 Other long term (current) drug therapy: Secondary | ICD-10-CM | POA: Diagnosis not present

## 2016-12-12 NOTE — Progress Notes (Signed)
  Radiation Oncology         (336) [phone] ________________________________  Name: Erika Freeman MRN: [id number]  Date: 12/12/2016  DOB: 1960-04-01  SIMULATION AND TREATMENT PLANNING NOTE   DIAGNOSIS:  ypT1a, ypN0, cM0, G3, ER: Positive, PR: Negative, HER2: Negative,  Invasive ductal carcinoma of the left breast  NARRATIVE:  The patient was brought to the [facility].  Identity was confirmed.  All relevant records and images related to the planned course of therapy were reviewed.  The patient freely provided informed written consent to proceed with treatment after reviewing the details related to the planned course of therapy. The consent form was witnessed and verified by the simulation staff.  Then, the patient was set-up in a stable reproducible  supine position for radiation therapy.  CT images were obtained.  Surface markings were placed.  The CT images were loaded into the planning software.  Then the target and avoidance structures were contoured.  Treatment planning then occurred.  The radiation prescription was entered and confirmed.  Then, I designed and supervised the construction of a total of 3 medically necessary complex treatment devices.  I have requested : 3D Simulation  I have requested a DVH of the following structures: lumpectomy cavity, heart, and lungs.  I have ordered: dose calc.  PLAN:  The patient will receive 50.4 Gy in 28 fractions. Given the 1 cm margin on the lumpectomy cavity, no boost is planned.  The patient did not wish to pursue hypo-fractionated accelerated radiation therapy.  -----------------------------------  Blair Promise, PhD, MD  This document serves as a record of services personally performed by [name], MD. It was created on his behalf by Reola Mosher, a trained medical scribe. The creation of this record is based on the scribe's personal observations and the provider's statements to them. This document has been  checked and approved by the attending provider.

## 2016-12-14 DIAGNOSIS — Z79899 Other long term (current) drug therapy: Secondary | ICD-10-CM | POA: Diagnosis not present

## 2016-12-14 DIAGNOSIS — Z7982 Long term (current) use of aspirin: Secondary | ICD-10-CM | POA: Diagnosis not present

## 2016-12-14 DIAGNOSIS — C50312 Malignant neoplasm of lower-inner quadrant of left female breast: Secondary | ICD-10-CM | POA: Diagnosis not present

## 2016-12-14 DIAGNOSIS — Z9221 Personal history of antineoplastic chemotherapy: Secondary | ICD-10-CM | POA: Diagnosis not present

## 2016-12-14 DIAGNOSIS — Z510 Encounter for antineoplastic radiation therapy: Secondary | ICD-10-CM | POA: Diagnosis not present

## 2016-12-14 DIAGNOSIS — Z170 Estrogen receptor positive status [ER+]: Secondary | ICD-10-CM | POA: Diagnosis not present

## 2016-12-19 ENCOUNTER — Ambulatory Visit
Admission: RE | Admit: 2016-12-19 | Discharge: 2016-12-19 | Disposition: A | Discharge status: Home / Self Care | Payer: Medicare Other | Source: Ambulatory Visit | Attending: Radiation Oncology | Admitting: Radiation Oncology

## 2016-12-19 DIAGNOSIS — Z510 Encounter for antineoplastic radiation therapy: Secondary | ICD-10-CM | POA: Diagnosis not present

## 2016-12-19 DIAGNOSIS — Z79899 Other long term (current) drug therapy: Secondary | ICD-10-CM | POA: Diagnosis not present

## 2016-12-19 DIAGNOSIS — C50312 Malignant neoplasm of lower-inner quadrant of left female breast: Secondary | ICD-10-CM | POA: Diagnosis not present

## 2016-12-19 DIAGNOSIS — Z7982 Long term (current) use of aspirin: Secondary | ICD-10-CM | POA: Diagnosis not present

## 2016-12-19 DIAGNOSIS — Z9221 Personal history of antineoplastic chemotherapy: Secondary | ICD-10-CM | POA: Diagnosis not present

## 2016-12-19 DIAGNOSIS — Z170 Estrogen receptor positive status [ER+]: Secondary | ICD-10-CM | POA: Diagnosis not present

## 2016-12-19 NOTE — Progress Notes (Signed)
  Radiation Oncology         (336) [phone] ________________________________  Name: Erika Freeman MRN: [id number]  Date: 12/19/2016  DOB: [birth year]  Simulation Verification Note    ICD-10-CM   1. Malignant neoplasm of lower-inner quadrant of left breast in female, estrogen receptor positive ([place]) C50.312    Z17.0     Status: outpatient  NARRATIVE: The patient was brought to the treatment unit and placed in the planned treatment position. The clinical setup was verified. Then port films were obtained and uploaded to the radiation oncology medical record software.  The treatment beams were carefully compared against the planned radiation fields. The position location and shape of the radiation fields was reviewed. They targeted volume of tissue appears to be appropriately covered by the radiation beams. Organs at risk appear to be excluded as planned.  Based on my personal review, I approved the simulation verification. The patient's treatment will proceed as planned.  -----------------------------------  Blair Promise, PhD, MD

## 2016-12-20 ENCOUNTER — Ambulatory Visit
Admission: RE | Admit: 2016-12-20 | Discharge: 2016-12-20 | Disposition: A | Discharge status: Home / Self Care | Payer: Medicare Other | Source: Ambulatory Visit | Attending: Radiation Oncology | Admitting: Radiation Oncology

## 2016-12-20 DIAGNOSIS — Z510 Encounter for antineoplastic radiation therapy: Secondary | ICD-10-CM | POA: Diagnosis not present

## 2016-12-20 DIAGNOSIS — Z79899 Other long term (current) drug therapy: Secondary | ICD-10-CM | POA: Diagnosis not present

## 2016-12-20 DIAGNOSIS — C50312 Malignant neoplasm of lower-inner quadrant of left female breast: Secondary | ICD-10-CM

## 2016-12-20 DIAGNOSIS — Z170 Estrogen receptor positive status [ER+]: Secondary | ICD-10-CM | POA: Diagnosis not present

## 2016-12-20 DIAGNOSIS — Z7982 Long term (current) use of aspirin: Secondary | ICD-10-CM | POA: Diagnosis not present

## 2016-12-20 DIAGNOSIS — Z9221 Personal history of antineoplastic chemotherapy: Secondary | ICD-10-CM | POA: Diagnosis not present

## 2016-12-20 MED ORDER — ALRA NON-METALLIC DEODORANT (RAD-ONC)
1.0000 "application " | Freq: Once | TOPICAL | Status: AC
  Administered 2016-12-20: 1 via TOPICAL

## 2016-12-20 MED ORDER — RADIAPLEXRX EX GEL
Freq: Once | CUTANEOUS | Status: AC
  Administered 2016-12-20: 12:00:00 via TOPICAL

## 2016-12-20 NOTE — Progress Notes (Signed)
Pt here for patient teaching.  Pt given Radiation and You booklet, skin care instructions, Alra deodorant and Radiaplex gel.  Reviewed areas of pertinence such as fatigue, skin changes, breast tenderness and breast swelling . Pt able to give teach back of to pat skin and use unscented/gentle soap,apply Radiaplex bid, avoid applying anything to skin within 4 hours of treatment and to use an electric razor if they must shave. Pt demonstrated understanding and verbalizes understanding of information given and will contact nursing with any questions or concerns.

## 2016-12-21 ENCOUNTER — Ambulatory Visit
Admission: RE | Admit: 2016-12-21 | Discharge: 2016-12-21 | Disposition: A | Discharge status: Home / Self Care | Payer: Medicare Other | Source: Ambulatory Visit | Attending: Radiation Oncology | Admitting: Radiation Oncology

## 2016-12-21 DIAGNOSIS — C50312 Malignant neoplasm of lower-inner quadrant of left female breast: Secondary | ICD-10-CM | POA: Diagnosis not present

## 2016-12-21 DIAGNOSIS — Z9221 Personal history of antineoplastic chemotherapy: Secondary | ICD-10-CM | POA: Diagnosis not present

## 2016-12-21 DIAGNOSIS — Z79899 Other long term (current) drug therapy: Secondary | ICD-10-CM | POA: Diagnosis not present

## 2016-12-21 DIAGNOSIS — Z170 Estrogen receptor positive status [ER+]: Secondary | ICD-10-CM | POA: Diagnosis not present

## 2016-12-21 DIAGNOSIS — Z7982 Long term (current) use of aspirin: Secondary | ICD-10-CM | POA: Diagnosis not present

## 2016-12-21 DIAGNOSIS — Z510 Encounter for antineoplastic radiation therapy: Secondary | ICD-10-CM | POA: Diagnosis not present

## 2016-12-22 ENCOUNTER — Ambulatory Visit
Admission: RE | Admit: 2016-12-22 | Discharge: 2016-12-22 | Disposition: A | Discharge status: Home / Self Care | Payer: Medicare Other | Source: Ambulatory Visit | Attending: Radiation Oncology | Admitting: Radiation Oncology

## 2016-12-22 DIAGNOSIS — Z7982 Long term (current) use of aspirin: Secondary | ICD-10-CM | POA: Diagnosis not present

## 2016-12-22 DIAGNOSIS — Z79899 Other long term (current) drug therapy: Secondary | ICD-10-CM | POA: Diagnosis not present

## 2016-12-22 DIAGNOSIS — Z510 Encounter for antineoplastic radiation therapy: Secondary | ICD-10-CM | POA: Diagnosis not present

## 2016-12-22 DIAGNOSIS — Z9221 Personal history of antineoplastic chemotherapy: Secondary | ICD-10-CM | POA: Diagnosis not present

## 2016-12-22 DIAGNOSIS — Z170 Estrogen receptor positive status [ER+]: Secondary | ICD-10-CM | POA: Diagnosis not present

## 2016-12-22 DIAGNOSIS — C50312 Malignant neoplasm of lower-inner quadrant of left female breast: Secondary | ICD-10-CM | POA: Diagnosis not present

## 2016-12-23 ENCOUNTER — Ambulatory Visit
Admission: RE | Admit: 2016-12-23 | Discharge: 2016-12-23 | Disposition: A | Discharge status: Home / Self Care | Payer: Medicare Other | Source: Ambulatory Visit | Attending: Radiation Oncology | Admitting: Radiation Oncology

## 2016-12-23 DIAGNOSIS — Z940 Kidney transplant status: Secondary | ICD-10-CM | POA: Diagnosis not present

## 2016-12-23 DIAGNOSIS — Z510 Encounter for antineoplastic radiation therapy: Secondary | ICD-10-CM | POA: Diagnosis not present

## 2016-12-23 DIAGNOSIS — Z5181 Encounter for therapeutic drug level monitoring: Secondary | ICD-10-CM | POA: Diagnosis not present

## 2016-12-23 DIAGNOSIS — Z7982 Long term (current) use of aspirin: Secondary | ICD-10-CM | POA: Diagnosis not present

## 2016-12-23 DIAGNOSIS — Z923 Personal history of irradiation: Secondary | ICD-10-CM | POA: Diagnosis not present

## 2016-12-23 DIAGNOSIS — Z9221 Personal history of antineoplastic chemotherapy: Secondary | ICD-10-CM | POA: Diagnosis not present

## 2016-12-23 DIAGNOSIS — Z170 Estrogen receptor positive status [ER+]: Secondary | ICD-10-CM | POA: Diagnosis not present

## 2016-12-23 DIAGNOSIS — Z9889 Other specified postprocedural states: Secondary | ICD-10-CM | POA: Diagnosis not present

## 2016-12-23 DIAGNOSIS — N051 Unspecified nephritic syndrome with focal and segmental glomerular lesions: Secondary | ICD-10-CM | POA: Diagnosis not present

## 2016-12-23 DIAGNOSIS — Z79899 Other long term (current) drug therapy: Secondary | ICD-10-CM | POA: Diagnosis not present

## 2016-12-23 DIAGNOSIS — C50312 Malignant neoplasm of lower-inner quadrant of left female breast: Secondary | ICD-10-CM | POA: Diagnosis not present

## 2016-12-23 DIAGNOSIS — Z853 Personal history of malignant neoplasm of breast: Secondary | ICD-10-CM | POA: Diagnosis not present

## 2016-12-23 DIAGNOSIS — I10 Essential (primary) hypertension: Secondary | ICD-10-CM | POA: Diagnosis not present

## 2016-12-23 DIAGNOSIS — D899 Disorder involving the immune mechanism, unspecified: Secondary | ICD-10-CM | POA: Diagnosis not present

## 2016-12-23 DIAGNOSIS — Z4822 Encounter for aftercare following kidney transplant: Secondary | ICD-10-CM | POA: Diagnosis not present

## 2016-12-26 ENCOUNTER — Ambulatory Visit
Admission: RE | Admit: 2016-12-26 | Discharge: 2016-12-26 | Disposition: A | Discharge status: Home / Self Care | Payer: Medicare Other | Source: Ambulatory Visit | Attending: Radiation Oncology | Admitting: Radiation Oncology

## 2016-12-26 DIAGNOSIS — Z510 Encounter for antineoplastic radiation therapy: Secondary | ICD-10-CM | POA: Diagnosis not present

## 2016-12-26 DIAGNOSIS — C50312 Malignant neoplasm of lower-inner quadrant of left female breast: Secondary | ICD-10-CM | POA: Diagnosis not present

## 2016-12-26 DIAGNOSIS — Z170 Estrogen receptor positive status [ER+]: Secondary | ICD-10-CM | POA: Diagnosis not present

## 2016-12-26 DIAGNOSIS — Z7982 Long term (current) use of aspirin: Secondary | ICD-10-CM | POA: Diagnosis not present

## 2016-12-26 DIAGNOSIS — Z9221 Personal history of antineoplastic chemotherapy: Secondary | ICD-10-CM | POA: Diagnosis not present

## 2016-12-26 DIAGNOSIS — Z79899 Other long term (current) drug therapy: Secondary | ICD-10-CM | POA: Diagnosis not present

## 2016-12-27 ENCOUNTER — Ambulatory Visit
Admission: RE | Admit: 2016-12-27 | Discharge: 2016-12-27 | Disposition: A | Discharge status: Home / Self Care | Payer: Medicare Other | Source: Ambulatory Visit | Attending: Radiation Oncology | Admitting: Radiation Oncology

## 2016-12-27 DIAGNOSIS — Z79899 Other long term (current) drug therapy: Secondary | ICD-10-CM | POA: Diagnosis not present

## 2016-12-27 DIAGNOSIS — Z170 Estrogen receptor positive status [ER+]: Secondary | ICD-10-CM | POA: Diagnosis not present

## 2016-12-27 DIAGNOSIS — Z510 Encounter for antineoplastic radiation therapy: Secondary | ICD-10-CM | POA: Diagnosis not present

## 2016-12-27 DIAGNOSIS — C50312 Malignant neoplasm of lower-inner quadrant of left female breast: Secondary | ICD-10-CM | POA: Diagnosis not present

## 2016-12-27 DIAGNOSIS — Z7982 Long term (current) use of aspirin: Secondary | ICD-10-CM | POA: Diagnosis not present

## 2016-12-27 DIAGNOSIS — Z9221 Personal history of antineoplastic chemotherapy: Secondary | ICD-10-CM | POA: Diagnosis not present

## 2016-12-28 ENCOUNTER — Ambulatory Visit
Admission: RE | Admit: 2016-12-28 | Discharge: 2016-12-28 | Disposition: A | Discharge status: Home / Self Care | Payer: Medicare Other | Source: Ambulatory Visit | Attending: Radiation Oncology | Admitting: Radiation Oncology

## 2016-12-28 DIAGNOSIS — Z7982 Long term (current) use of aspirin: Secondary | ICD-10-CM | POA: Diagnosis not present

## 2016-12-28 DIAGNOSIS — Z510 Encounter for antineoplastic radiation therapy: Secondary | ICD-10-CM | POA: Diagnosis not present

## 2016-12-28 DIAGNOSIS — Z79899 Other long term (current) drug therapy: Secondary | ICD-10-CM | POA: Diagnosis not present

## 2016-12-28 DIAGNOSIS — Z170 Estrogen receptor positive status [ER+]: Secondary | ICD-10-CM | POA: Diagnosis not present

## 2016-12-28 DIAGNOSIS — C50312 Malignant neoplasm of lower-inner quadrant of left female breast: Secondary | ICD-10-CM | POA: Diagnosis not present

## 2016-12-28 DIAGNOSIS — Z9221 Personal history of antineoplastic chemotherapy: Secondary | ICD-10-CM | POA: Diagnosis not present

## 2016-12-29 ENCOUNTER — Ambulatory Visit
Admission: RE | Admit: 2016-12-29 | Discharge: 2016-12-29 | Disposition: A | Discharge status: Home / Self Care | Payer: Medicare Other | Source: Ambulatory Visit | Attending: Radiation Oncology | Admitting: Radiation Oncology

## 2016-12-29 DIAGNOSIS — Z7982 Long term (current) use of aspirin: Secondary | ICD-10-CM | POA: Diagnosis not present

## 2016-12-29 DIAGNOSIS — Z170 Estrogen receptor positive status [ER+]: Secondary | ICD-10-CM | POA: Diagnosis not present

## 2016-12-29 DIAGNOSIS — C50312 Malignant neoplasm of lower-inner quadrant of left female breast: Secondary | ICD-10-CM | POA: Diagnosis not present

## 2016-12-29 DIAGNOSIS — Z79899 Other long term (current) drug therapy: Secondary | ICD-10-CM | POA: Diagnosis not present

## 2016-12-29 DIAGNOSIS — Z9221 Personal history of antineoplastic chemotherapy: Secondary | ICD-10-CM | POA: Diagnosis not present

## 2016-12-29 DIAGNOSIS — Z510 Encounter for antineoplastic radiation therapy: Secondary | ICD-10-CM | POA: Diagnosis not present

## 2016-12-30 ENCOUNTER — Ambulatory Visit
Admission: RE | Admit: 2016-12-30 | Discharge: 2016-12-30 | Disposition: A | Discharge status: Home / Self Care | Payer: Medicare Other | Source: Ambulatory Visit | Attending: Radiation Oncology | Admitting: Radiation Oncology

## 2016-12-30 DIAGNOSIS — Z170 Estrogen receptor positive status [ER+]: Secondary | ICD-10-CM | POA: Diagnosis not present

## 2016-12-30 DIAGNOSIS — Z510 Encounter for antineoplastic radiation therapy: Secondary | ICD-10-CM | POA: Diagnosis not present

## 2016-12-30 DIAGNOSIS — Z79899 Other long term (current) drug therapy: Secondary | ICD-10-CM | POA: Diagnosis not present

## 2016-12-30 DIAGNOSIS — Z7982 Long term (current) use of aspirin: Secondary | ICD-10-CM | POA: Diagnosis not present

## 2016-12-30 DIAGNOSIS — Z9221 Personal history of antineoplastic chemotherapy: Secondary | ICD-10-CM | POA: Diagnosis not present

## 2016-12-30 DIAGNOSIS — C50312 Malignant neoplasm of lower-inner quadrant of left female breast: Secondary | ICD-10-CM | POA: Diagnosis not present

## 2017-01-02 ENCOUNTER — Ambulatory Visit
Admission: RE | Admit: 2017-01-02 | Discharge: 2017-01-02 | Disposition: A | Discharge status: Home / Self Care | Payer: Medicare Other | Source: Ambulatory Visit | Attending: Radiation Oncology | Admitting: Radiation Oncology

## 2017-01-02 DIAGNOSIS — Z170 Estrogen receptor positive status [ER+]: Secondary | ICD-10-CM | POA: Diagnosis not present

## 2017-01-02 DIAGNOSIS — Z9221 Personal history of antineoplastic chemotherapy: Secondary | ICD-10-CM | POA: Diagnosis not present

## 2017-01-02 DIAGNOSIS — Z7982 Long term (current) use of aspirin: Secondary | ICD-10-CM | POA: Diagnosis not present

## 2017-01-02 DIAGNOSIS — C50312 Malignant neoplasm of lower-inner quadrant of left female breast: Secondary | ICD-10-CM | POA: Diagnosis not present

## 2017-01-02 DIAGNOSIS — Z510 Encounter for antineoplastic radiation therapy: Secondary | ICD-10-CM | POA: Diagnosis not present

## 2017-01-02 DIAGNOSIS — Z79899 Other long term (current) drug therapy: Secondary | ICD-10-CM | POA: Diagnosis not present

## 2017-01-03 ENCOUNTER — Ambulatory Visit
Admission: RE | Admit: 2017-01-03 | Discharge: 2017-01-03 | Disposition: A | Discharge status: Home / Self Care | Payer: Medicare Other | Source: Ambulatory Visit | Attending: Radiation Oncology | Admitting: Radiation Oncology

## 2017-01-03 DIAGNOSIS — C50312 Malignant neoplasm of lower-inner quadrant of left female breast: Secondary | ICD-10-CM | POA: Diagnosis not present

## 2017-01-03 DIAGNOSIS — Z170 Estrogen receptor positive status [ER+]: Secondary | ICD-10-CM | POA: Diagnosis not present

## 2017-01-03 DIAGNOSIS — Z510 Encounter for antineoplastic radiation therapy: Secondary | ICD-10-CM | POA: Diagnosis not present

## 2017-01-03 DIAGNOSIS — Z79899 Other long term (current) drug therapy: Secondary | ICD-10-CM | POA: Diagnosis not present

## 2017-01-03 DIAGNOSIS — Z7982 Long term (current) use of aspirin: Secondary | ICD-10-CM | POA: Diagnosis not present

## 2017-01-03 DIAGNOSIS — Z9221 Personal history of antineoplastic chemotherapy: Secondary | ICD-10-CM | POA: Diagnosis not present

## 2017-01-04 ENCOUNTER — Ambulatory Visit
Admission: RE | Admit: 2017-01-04 | Discharge: 2017-01-04 | Disposition: A | Discharge status: Home / Self Care | Payer: Medicare Other | Source: Ambulatory Visit | Attending: Radiation Oncology | Admitting: Radiation Oncology

## 2017-01-04 DIAGNOSIS — Z79899 Other long term (current) drug therapy: Secondary | ICD-10-CM | POA: Diagnosis not present

## 2017-01-04 DIAGNOSIS — Z7982 Long term (current) use of aspirin: Secondary | ICD-10-CM | POA: Diagnosis not present

## 2017-01-04 DIAGNOSIS — C50312 Malignant neoplasm of lower-inner quadrant of left female breast: Secondary | ICD-10-CM | POA: Diagnosis not present

## 2017-01-04 DIAGNOSIS — Z9221 Personal history of antineoplastic chemotherapy: Secondary | ICD-10-CM | POA: Diagnosis not present

## 2017-01-04 DIAGNOSIS — Z510 Encounter for antineoplastic radiation therapy: Secondary | ICD-10-CM | POA: Diagnosis not present

## 2017-01-04 DIAGNOSIS — Z170 Estrogen receptor positive status [ER+]: Secondary | ICD-10-CM | POA: Diagnosis not present

## 2017-01-05 ENCOUNTER — Ambulatory Visit
Admission: RE | Admit: 2017-01-05 | Discharge: 2017-01-05 | Disposition: A | Discharge status: Home / Self Care | Payer: Medicare Other | Source: Ambulatory Visit | Attending: Radiation Oncology | Admitting: Radiation Oncology

## 2017-01-05 DIAGNOSIS — Z510 Encounter for antineoplastic radiation therapy: Secondary | ICD-10-CM | POA: Diagnosis not present

## 2017-01-05 DIAGNOSIS — Z9221 Personal history of antineoplastic chemotherapy: Secondary | ICD-10-CM | POA: Diagnosis not present

## 2017-01-05 DIAGNOSIS — Z170 Estrogen receptor positive status [ER+]: Secondary | ICD-10-CM | POA: Diagnosis not present

## 2017-01-05 DIAGNOSIS — Z79899 Other long term (current) drug therapy: Secondary | ICD-10-CM | POA: Diagnosis not present

## 2017-01-05 DIAGNOSIS — Z7982 Long term (current) use of aspirin: Secondary | ICD-10-CM | POA: Diagnosis not present

## 2017-01-05 DIAGNOSIS — C50312 Malignant neoplasm of lower-inner quadrant of left female breast: Secondary | ICD-10-CM | POA: Diagnosis not present

## 2017-01-06 ENCOUNTER — Ambulatory Visit
Admission: RE | Admit: 2017-01-06 | Discharge: 2017-01-06 | Disposition: A | Discharge status: Home / Self Care | Payer: Medicare Other | Source: Ambulatory Visit | Attending: Radiation Oncology | Admitting: Radiation Oncology

## 2017-01-06 DIAGNOSIS — Z510 Encounter for antineoplastic radiation therapy: Secondary | ICD-10-CM | POA: Diagnosis not present

## 2017-01-06 DIAGNOSIS — Z9221 Personal history of antineoplastic chemotherapy: Secondary | ICD-10-CM | POA: Diagnosis not present

## 2017-01-06 DIAGNOSIS — Z170 Estrogen receptor positive status [ER+]: Secondary | ICD-10-CM | POA: Diagnosis not present

## 2017-01-06 DIAGNOSIS — Z7982 Long term (current) use of aspirin: Secondary | ICD-10-CM | POA: Diagnosis not present

## 2017-01-06 DIAGNOSIS — C50312 Malignant neoplasm of lower-inner quadrant of left female breast: Secondary | ICD-10-CM | POA: Diagnosis not present

## 2017-01-06 DIAGNOSIS — Z79899 Other long term (current) drug therapy: Secondary | ICD-10-CM | POA: Diagnosis not present

## 2017-01-09 ENCOUNTER — Ambulatory Visit
Admission: RE | Admit: 2017-01-09 | Discharge: 2017-01-09 | Disposition: A | Discharge status: Home / Self Care | Payer: Medicare Other | Source: Ambulatory Visit | Attending: Radiation Oncology | Admitting: Radiation Oncology

## 2017-01-09 DIAGNOSIS — C50312 Malignant neoplasm of lower-inner quadrant of left female breast: Secondary | ICD-10-CM | POA: Diagnosis not present

## 2017-01-09 DIAGNOSIS — Z79899 Other long term (current) drug therapy: Secondary | ICD-10-CM | POA: Diagnosis not present

## 2017-01-09 DIAGNOSIS — Z7982 Long term (current) use of aspirin: Secondary | ICD-10-CM | POA: Diagnosis not present

## 2017-01-09 DIAGNOSIS — Z170 Estrogen receptor positive status [ER+]: Secondary | ICD-10-CM | POA: Diagnosis not present

## 2017-01-09 DIAGNOSIS — Z510 Encounter for antineoplastic radiation therapy: Secondary | ICD-10-CM | POA: Diagnosis not present

## 2017-01-09 DIAGNOSIS — Z9221 Personal history of antineoplastic chemotherapy: Secondary | ICD-10-CM | POA: Diagnosis not present

## 2017-01-10 ENCOUNTER — Ambulatory Visit
Admission: RE | Admit: 2017-01-10 | Discharge: 2017-01-10 | Disposition: A | Discharge status: Home / Self Care | Payer: Medicare Other | Source: Ambulatory Visit | Attending: Radiation Oncology | Admitting: Radiation Oncology

## 2017-01-10 DIAGNOSIS — Z510 Encounter for antineoplastic radiation therapy: Secondary | ICD-10-CM | POA: Diagnosis not present

## 2017-01-10 DIAGNOSIS — Z170 Estrogen receptor positive status [ER+]: Secondary | ICD-10-CM | POA: Diagnosis not present

## 2017-01-10 DIAGNOSIS — C50312 Malignant neoplasm of lower-inner quadrant of left female breast: Secondary | ICD-10-CM | POA: Diagnosis not present

## 2017-01-10 DIAGNOSIS — Z7982 Long term (current) use of aspirin: Secondary | ICD-10-CM | POA: Diagnosis not present

## 2017-01-10 DIAGNOSIS — Z9221 Personal history of antineoplastic chemotherapy: Secondary | ICD-10-CM | POA: Diagnosis not present

## 2017-01-10 DIAGNOSIS — Z79899 Other long term (current) drug therapy: Secondary | ICD-10-CM | POA: Diagnosis not present

## 2017-01-11 ENCOUNTER — Ambulatory Visit
Admission: RE | Admit: 2017-01-11 | Discharge: 2017-01-11 | Disposition: A | Discharge status: Home / Self Care | Payer: Medicare Other | Source: Ambulatory Visit | Attending: Radiation Oncology | Admitting: Radiation Oncology

## 2017-01-11 DIAGNOSIS — Z510 Encounter for antineoplastic radiation therapy: Secondary | ICD-10-CM | POA: Diagnosis not present

## 2017-01-11 DIAGNOSIS — Z170 Estrogen receptor positive status [ER+]: Secondary | ICD-10-CM | POA: Diagnosis not present

## 2017-01-11 DIAGNOSIS — Z7982 Long term (current) use of aspirin: Secondary | ICD-10-CM | POA: Diagnosis not present

## 2017-01-11 DIAGNOSIS — Z9221 Personal history of antineoplastic chemotherapy: Secondary | ICD-10-CM | POA: Diagnosis not present

## 2017-01-11 DIAGNOSIS — C50312 Malignant neoplasm of lower-inner quadrant of left female breast: Secondary | ICD-10-CM | POA: Diagnosis not present

## 2017-01-11 DIAGNOSIS — Z79899 Other long term (current) drug therapy: Secondary | ICD-10-CM | POA: Diagnosis not present

## 2017-01-12 ENCOUNTER — Ambulatory Visit
Admission: RE | Admit: 2017-01-12 | Discharge: 2017-01-12 | Disposition: A | Discharge status: Home / Self Care | Payer: Medicare Other | Source: Ambulatory Visit | Attending: Radiation Oncology | Admitting: Radiation Oncology

## 2017-01-12 DIAGNOSIS — Z7982 Long term (current) use of aspirin: Secondary | ICD-10-CM | POA: Diagnosis not present

## 2017-01-12 DIAGNOSIS — Z9221 Personal history of antineoplastic chemotherapy: Secondary | ICD-10-CM | POA: Diagnosis not present

## 2017-01-12 DIAGNOSIS — Z79899 Other long term (current) drug therapy: Secondary | ICD-10-CM | POA: Diagnosis not present

## 2017-01-12 DIAGNOSIS — C50312 Malignant neoplasm of lower-inner quadrant of left female breast: Secondary | ICD-10-CM | POA: Diagnosis not present

## 2017-01-12 DIAGNOSIS — Z510 Encounter for antineoplastic radiation therapy: Secondary | ICD-10-CM | POA: Diagnosis not present

## 2017-01-12 DIAGNOSIS — Z170 Estrogen receptor positive status [ER+]: Secondary | ICD-10-CM | POA: Diagnosis not present

## 2017-01-13 ENCOUNTER — Ambulatory Visit
Admission: RE | Admit: 2017-01-13 | Discharge: 2017-01-13 | Disposition: A | Discharge status: Home / Self Care | Payer: Medicare Other | Source: Ambulatory Visit | Attending: Radiation Oncology | Admitting: Radiation Oncology

## 2017-01-13 DIAGNOSIS — C50312 Malignant neoplasm of lower-inner quadrant of left female breast: Secondary | ICD-10-CM | POA: Diagnosis not present

## 2017-01-13 DIAGNOSIS — Z170 Estrogen receptor positive status [ER+]: Secondary | ICD-10-CM | POA: Diagnosis not present

## 2017-01-13 DIAGNOSIS — Z79899 Other long term (current) drug therapy: Secondary | ICD-10-CM | POA: Diagnosis not present

## 2017-01-13 DIAGNOSIS — Z9221 Personal history of antineoplastic chemotherapy: Secondary | ICD-10-CM | POA: Diagnosis not present

## 2017-01-13 DIAGNOSIS — Z510 Encounter for antineoplastic radiation therapy: Secondary | ICD-10-CM | POA: Diagnosis not present

## 2017-01-13 DIAGNOSIS — Z7982 Long term (current) use of aspirin: Secondary | ICD-10-CM | POA: Diagnosis not present

## 2017-01-15 ENCOUNTER — Ambulatory Visit
Admission: RE | Admit: 2017-01-15 | Discharge: 2017-01-15 | Disposition: A | Discharge status: Home / Self Care | Payer: Medicare Other | Source: Ambulatory Visit | Attending: Radiation Oncology | Admitting: Radiation Oncology

## 2017-01-15 DIAGNOSIS — C50312 Malignant neoplasm of lower-inner quadrant of left female breast: Secondary | ICD-10-CM | POA: Diagnosis not present

## 2017-01-15 DIAGNOSIS — Z9221 Personal history of antineoplastic chemotherapy: Secondary | ICD-10-CM | POA: Diagnosis not present

## 2017-01-15 DIAGNOSIS — Z7982 Long term (current) use of aspirin: Secondary | ICD-10-CM | POA: Diagnosis not present

## 2017-01-15 DIAGNOSIS — Z170 Estrogen receptor positive status [ER+]: Secondary | ICD-10-CM | POA: Diagnosis not present

## 2017-01-15 DIAGNOSIS — Z510 Encounter for antineoplastic radiation therapy: Secondary | ICD-10-CM | POA: Diagnosis not present

## 2017-01-15 DIAGNOSIS — Z79899 Other long term (current) drug therapy: Secondary | ICD-10-CM | POA: Diagnosis not present

## 2017-01-16 ENCOUNTER — Ambulatory Visit
Admission: RE | Admit: 2017-01-16 | Discharge: 2017-01-16 | Disposition: A | Discharge status: Home / Self Care | Payer: Medicare Other | Source: Ambulatory Visit | Attending: Radiation Oncology | Admitting: Radiation Oncology

## 2017-01-16 DIAGNOSIS — Z170 Estrogen receptor positive status [ER+]: Secondary | ICD-10-CM | POA: Diagnosis not present

## 2017-01-16 DIAGNOSIS — C50312 Malignant neoplasm of lower-inner quadrant of left female breast: Secondary | ICD-10-CM | POA: Diagnosis not present

## 2017-01-16 DIAGNOSIS — Z510 Encounter for antineoplastic radiation therapy: Secondary | ICD-10-CM | POA: Diagnosis not present

## 2017-01-16 DIAGNOSIS — Z7982 Long term (current) use of aspirin: Secondary | ICD-10-CM | POA: Diagnosis not present

## 2017-01-16 DIAGNOSIS — Z79899 Other long term (current) drug therapy: Secondary | ICD-10-CM | POA: Diagnosis not present

## 2017-01-16 DIAGNOSIS — Z9221 Personal history of antineoplastic chemotherapy: Secondary | ICD-10-CM | POA: Diagnosis not present

## 2017-01-17 ENCOUNTER — Ambulatory Visit
Admission: RE | Admit: 2017-01-17 | Discharge: 2017-01-17 | Disposition: A | Discharge status: Home / Self Care | Payer: Medicare Other | Source: Ambulatory Visit | Attending: Radiation Oncology | Admitting: Radiation Oncology

## 2017-01-17 DIAGNOSIS — Z9221 Personal history of antineoplastic chemotherapy: Secondary | ICD-10-CM | POA: Diagnosis not present

## 2017-01-17 DIAGNOSIS — Z79899 Other long term (current) drug therapy: Secondary | ICD-10-CM | POA: Diagnosis not present

## 2017-01-17 DIAGNOSIS — Z170 Estrogen receptor positive status [ER+]: Secondary | ICD-10-CM | POA: Diagnosis not present

## 2017-01-17 DIAGNOSIS — Z510 Encounter for antineoplastic radiation therapy: Secondary | ICD-10-CM | POA: Diagnosis not present

## 2017-01-17 DIAGNOSIS — C50312 Malignant neoplasm of lower-inner quadrant of left female breast: Secondary | ICD-10-CM | POA: Diagnosis not present

## 2017-01-17 DIAGNOSIS — Z7982 Long term (current) use of aspirin: Secondary | ICD-10-CM | POA: Diagnosis not present

## 2017-01-18 ENCOUNTER — Ambulatory Visit
Admission: RE | Admit: 2017-01-18 | Discharge: 2017-01-18 | Disposition: A | Discharge status: Home / Self Care | Payer: Medicare Other | Source: Ambulatory Visit | Attending: Radiation Oncology | Admitting: Radiation Oncology

## 2017-01-18 DIAGNOSIS — Z7982 Long term (current) use of aspirin: Secondary | ICD-10-CM | POA: Diagnosis not present

## 2017-01-18 DIAGNOSIS — Z170 Estrogen receptor positive status [ER+]: Secondary | ICD-10-CM | POA: Diagnosis not present

## 2017-01-18 DIAGNOSIS — Z79899 Other long term (current) drug therapy: Secondary | ICD-10-CM | POA: Diagnosis not present

## 2017-01-18 DIAGNOSIS — Z9221 Personal history of antineoplastic chemotherapy: Secondary | ICD-10-CM | POA: Diagnosis not present

## 2017-01-18 DIAGNOSIS — Z510 Encounter for antineoplastic radiation therapy: Secondary | ICD-10-CM | POA: Diagnosis not present

## 2017-01-18 DIAGNOSIS — C50312 Malignant neoplasm of lower-inner quadrant of left female breast: Secondary | ICD-10-CM | POA: Diagnosis not present

## 2017-01-23 ENCOUNTER — Ambulatory Visit
Admission: RE | Admit: 2017-01-23 | Discharge: 2017-01-23 | Disposition: A | Discharge status: Home / Self Care | Payer: Medicare Other | Source: Ambulatory Visit | Attending: Radiation Oncology | Admitting: Radiation Oncology

## 2017-01-23 DIAGNOSIS — Z510 Encounter for antineoplastic radiation therapy: Secondary | ICD-10-CM | POA: Diagnosis not present

## 2017-01-23 DIAGNOSIS — Z7982 Long term (current) use of aspirin: Secondary | ICD-10-CM | POA: Diagnosis not present

## 2017-01-23 DIAGNOSIS — Z9221 Personal history of antineoplastic chemotherapy: Secondary | ICD-10-CM | POA: Diagnosis not present

## 2017-01-23 DIAGNOSIS — Z170 Estrogen receptor positive status [ER+]: Secondary | ICD-10-CM | POA: Diagnosis not present

## 2017-01-23 DIAGNOSIS — Z79899 Other long term (current) drug therapy: Secondary | ICD-10-CM | POA: Diagnosis not present

## 2017-01-23 DIAGNOSIS — Z940 Kidney transplant status: Secondary | ICD-10-CM | POA: Diagnosis not present

## 2017-01-23 DIAGNOSIS — C50312 Malignant neoplasm of lower-inner quadrant of left female breast: Secondary | ICD-10-CM | POA: Diagnosis not present

## 2017-01-24 ENCOUNTER — Ambulatory Visit: Payer: Medicare Other | Admitting: Radiation Oncology

## 2017-01-24 ENCOUNTER — Ambulatory Visit
Admission: RE | Admit: 2017-01-24 | Discharge: 2017-01-24 | Disposition: A | Discharge status: Home / Self Care | Payer: Medicare Other | Source: Ambulatory Visit | Attending: Radiation Oncology | Admitting: Radiation Oncology

## 2017-01-24 DIAGNOSIS — Z170 Estrogen receptor positive status [ER+]: Secondary | ICD-10-CM | POA: Diagnosis not present

## 2017-01-24 DIAGNOSIS — Z510 Encounter for antineoplastic radiation therapy: Secondary | ICD-10-CM | POA: Diagnosis not present

## 2017-01-24 DIAGNOSIS — Z79899 Other long term (current) drug therapy: Secondary | ICD-10-CM | POA: Diagnosis not present

## 2017-01-24 DIAGNOSIS — Z7982 Long term (current) use of aspirin: Secondary | ICD-10-CM | POA: Diagnosis not present

## 2017-01-24 DIAGNOSIS — Z9221 Personal history of antineoplastic chemotherapy: Secondary | ICD-10-CM | POA: Diagnosis not present

## 2017-01-24 DIAGNOSIS — C50312 Malignant neoplasm of lower-inner quadrant of left female breast: Secondary | ICD-10-CM | POA: Diagnosis not present

## 2017-01-25 ENCOUNTER — Ambulatory Visit
Admission: RE | Admit: 2017-01-25 | Discharge: 2017-01-25 | Disposition: A | Discharge status: Home / Self Care | Payer: Medicare Other | Source: Ambulatory Visit | Attending: Radiation Oncology | Admitting: Radiation Oncology

## 2017-01-25 DIAGNOSIS — C50312 Malignant neoplasm of lower-inner quadrant of left female breast: Secondary | ICD-10-CM | POA: Diagnosis not present

## 2017-01-25 DIAGNOSIS — Z170 Estrogen receptor positive status [ER+]: Secondary | ICD-10-CM | POA: Diagnosis not present

## 2017-01-25 DIAGNOSIS — Z510 Encounter for antineoplastic radiation therapy: Secondary | ICD-10-CM | POA: Diagnosis not present

## 2017-01-25 DIAGNOSIS — Z9221 Personal history of antineoplastic chemotherapy: Secondary | ICD-10-CM | POA: Diagnosis not present

## 2017-01-25 DIAGNOSIS — Z7982 Long term (current) use of aspirin: Secondary | ICD-10-CM | POA: Diagnosis not present

## 2017-01-25 DIAGNOSIS — Z79899 Other long term (current) drug therapy: Secondary | ICD-10-CM | POA: Diagnosis not present

## 2017-01-26 ENCOUNTER — Ambulatory Visit
Admission: RE | Admit: 2017-01-26 | Discharge: 2017-01-26 | Disposition: A | Discharge status: Home / Self Care | Payer: Medicare Other | Source: Ambulatory Visit | Attending: Radiation Oncology | Admitting: Radiation Oncology

## 2017-01-26 DIAGNOSIS — Z170 Estrogen receptor positive status [ER+]: Secondary | ICD-10-CM | POA: Diagnosis not present

## 2017-01-26 DIAGNOSIS — Z510 Encounter for antineoplastic radiation therapy: Secondary | ICD-10-CM | POA: Diagnosis not present

## 2017-01-26 DIAGNOSIS — Z9221 Personal history of antineoplastic chemotherapy: Secondary | ICD-10-CM | POA: Diagnosis not present

## 2017-01-26 DIAGNOSIS — C50312 Malignant neoplasm of lower-inner quadrant of left female breast: Secondary | ICD-10-CM | POA: Diagnosis not present

## 2017-01-26 DIAGNOSIS — Z7982 Long term (current) use of aspirin: Secondary | ICD-10-CM | POA: Diagnosis not present

## 2017-01-26 DIAGNOSIS — Z79899 Other long term (current) drug therapy: Secondary | ICD-10-CM | POA: Diagnosis not present

## 2017-01-27 ENCOUNTER — Ambulatory Visit
Admission: RE | Admit: 2017-01-27 | Discharge: 2017-01-27 | Disposition: A | Discharge status: Home / Self Care | Payer: Medicare Other | Source: Ambulatory Visit | Attending: Radiation Oncology | Admitting: Radiation Oncology

## 2017-01-27 ENCOUNTER — Ambulatory Visit: Payer: Medicare Other

## 2017-01-27 DIAGNOSIS — Z79899 Other long term (current) drug therapy: Secondary | ICD-10-CM | POA: Diagnosis not present

## 2017-01-27 DIAGNOSIS — Z9221 Personal history of antineoplastic chemotherapy: Secondary | ICD-10-CM | POA: Diagnosis not present

## 2017-01-27 DIAGNOSIS — Z170 Estrogen receptor positive status [ER+]: Secondary | ICD-10-CM | POA: Diagnosis not present

## 2017-01-27 DIAGNOSIS — Z7982 Long term (current) use of aspirin: Secondary | ICD-10-CM | POA: Diagnosis not present

## 2017-01-27 DIAGNOSIS — C50312 Malignant neoplasm of lower-inner quadrant of left female breast: Secondary | ICD-10-CM | POA: Diagnosis not present

## 2017-01-27 DIAGNOSIS — Z510 Encounter for antineoplastic radiation therapy: Secondary | ICD-10-CM | POA: Diagnosis not present

## 2017-01-30 ENCOUNTER — Encounter: Payer: Self-pay | Admitting: Radiation Oncology

## 2017-01-30 ENCOUNTER — Ambulatory Visit: Payer: Medicare Other

## 2017-01-30 NOTE — Progress Notes (Signed)
  Radiation Oncology         (336) [phone] ________________________________  Name: Erika Freeman MRN: [id number]  Date: 01/30/2017  DOB: 1961/02/05  End of Treatment Note  Diagnosis:  ypT1a, ypN0, cM0, G3, ER: Positive, PR: Negative, HER2: Negative, Invasive ductal carcinoma of the left breast  Indication for treatment: Curative, breast conservation therapy    Radiation treatment dates:   12/20/16-01/17/17  Site/dose:   Left Breast, 50.4 Gy total delivered in 28 fractions   Beams/energy:   3D, 6X  Narrative: The patient tolerated radiation treatment relatively well.  Initially, the patient did not develop any treatment related symptoms, however, as her treatment continued she developed some radiation-related skin changes to her treatment fields which was well controlled with radiaplex.   Plan: The patient has completed radiation treatment. The patient will return to radiation oncology clinic for routine followup in one month. I advised them to call or return sooner if they have any questions or concerns related to their recovery or treatment.  -----------------------------------  Blair Promise, PhD, MD  This document serves as a record of services personally performed by [name], MD. It was created on his behalf by Reola Mosher, a trained medical scribe. The creation of this record is based on the scribe's personal observations and the provider's statements to them. This document has been checked and approved by the attending provider.

## 2017-01-31 ENCOUNTER — Telehealth: Payer: Self-pay | Admitting: Oncology

## 2017-01-31 ENCOUNTER — Ambulatory Visit: Payer: Medicare Other

## 2017-01-31 NOTE — Telephone Encounter (Signed)
Patient called and asked if radiation can cause a cough.  She said she finished radiation on Friday and started to have a cough on Saturday.  She said the cough is occasionally productive with clear sputum.  She denies having a fever but said her husband thought her face looks flushed.  Advised her that radiation to the left breast would not cause a cough but will check with Dr. Sondra Come.  Also advised her to take Robitussin DM as needed for the cough.

## 2017-01-31 NOTE — Telephone Encounter (Signed)
Called patient back and advised her that Dr. Sondra Come does not think the cough is from radiation and that she should call her PCP in a few days if it does not clear up.  She verbalized understanding.

## 2017-02-01 ENCOUNTER — Ambulatory Visit: Payer: Medicare Other

## 2017-02-02 ENCOUNTER — Ambulatory Visit: Payer: Medicare Other

## 2017-02-03 ENCOUNTER — Ambulatory Visit: Payer: Medicare Other

## 2017-02-06 ENCOUNTER — Ambulatory Visit: Payer: Medicare Other

## 2017-02-14 ENCOUNTER — Telehealth: Payer: Self-pay | Admitting: Oncology

## 2017-02-14 ENCOUNTER — Encounter: Payer: Self-pay | Admitting: *Deleted

## 2017-02-14 ENCOUNTER — Ambulatory Visit (HOSPITAL_BASED_OUTPATIENT_CLINIC_OR_DEPARTMENT_OTHER): Payer: Medicare Other | Admitting: Oncology

## 2017-02-14 ENCOUNTER — Other Ambulatory Visit (HOSPITAL_BASED_OUTPATIENT_CLINIC_OR_DEPARTMENT_OTHER): Payer: Medicare Other

## 2017-02-14 VITALS — BP 133/86 | HR 95 | Temp 97.8°F | Resp 18 | Ht 65.0 in | Wt 164.8 lb

## 2017-02-14 DIAGNOSIS — C50312 Malignant neoplasm of lower-inner quadrant of left female breast: Secondary | ICD-10-CM

## 2017-02-14 DIAGNOSIS — Z170 Estrogen receptor positive status [ER+]: Secondary | ICD-10-CM | POA: Diagnosis not present

## 2017-02-14 DIAGNOSIS — Z940 Kidney transplant status: Secondary | ICD-10-CM

## 2017-02-14 DIAGNOSIS — Z79810 Long term (current) use of selective estrogen receptor modulators (SERMs): Secondary | ICD-10-CM

## 2017-02-14 LAB — COMPREHENSIVE METABOLIC PANEL
ALT: 15 U/L (ref 0–55)
AST: 15 U/L (ref 5–34)
Albumin: 4.4 g/dL (ref 3.5–5.0)
Alkaline Phosphatase: 72 U/L (ref 40–150)
Anion Gap: 10 mEq/L (ref 3–11)
BUN: 14.1 mg/dL (ref 7.0–26.0)
CALCIUM: 9.6 mg/dL (ref 8.4–10.4)
CHLORIDE: 103 meq/L (ref 98–109)
CO2: 30 meq/L — AB (ref 22–29)
CREATININE: 1.2 mg/dL — AB (ref 0.6–1.1)
EGFR: 59 mL/min/{1.73_m2} — ABNORMAL LOW (ref >60)
GLUCOSE: 104 mg/dL (ref 70–140)
POTASSIUM: 2.8 meq/L — AB (ref 3.5–5.1)
Sodium: 142 mEq/L (ref 136–145)
TOTAL PROTEIN: 7.8 g/dL (ref 6.4–8.3)
Total Bilirubin: 0.53 mg/dL (ref 0.20–1.20)

## 2017-02-14 LAB — CBC WITH DIFFERENTIAL/PLATELET
BASO%: 0.2 % (ref 0.0–2.0)
Basophils Absolute: 0 10*3/uL (ref 0.0–0.1)
EOS%: 1.9 % (ref 0.0–7.0)
Eosinophils Absolute: 0.2 10*3/uL (ref 0.0–0.5)
HEMATOCRIT: 41 % (ref 34.8–46.6)
HEMOGLOBIN: 13.1 g/dL (ref 11.6–15.9)
LYMPH#: 1 10*3/uL (ref 0.9–3.3)
LYMPH%: 9.5 % — ABNORMAL LOW (ref 14.0–49.7)
MCH: 28.1 pg (ref 25.1–34.0)
MCHC: 32 g/dL (ref 31.5–36.0)
MCV: 88 fL (ref 79.5–101.0)
MONO#: 0.7 10*3/uL (ref 0.1–0.9)
MONO%: 6.8 % (ref 0.0–14.0)
NEUT%: 81.6 % — AB (ref 38.4–76.8)
NEUTROS ABS: 8.4 10*3/uL — AB (ref 1.5–6.5)
PLATELETS: 183 10*3/uL (ref 145–400)
RBC: 4.66 10*6/uL (ref 3.70–5.45)
RDW: 14 % (ref 11.2–14.5)
WBC: 10.2 10*3/uL (ref 3.9–10.3)

## 2017-02-14 MED ORDER — ANASTROZOLE 1 MG PO TABS: 1.0000 mg | ORAL_TABLET | Freq: Every day | ORAL | 4 refills | Status: DC

## 2017-02-14 MED ORDER — VITAMIN D 1000 UNITS PO TABS: 1000.0000 [IU] | ORAL_TABLET | Freq: Every day | ORAL | 4 refills | Status: DC

## 2017-02-14 NOTE — Telephone Encounter (Signed)
Patient called and asked if she can get a refill of radiaplex.  Asked her if she is still experiencing skin irriation.  She said not really but was unsure how long to use the radiaplex.  Advised her that she can use any lotion with vitamin E in it since she is now a month out of treatment.  She verbalized understanding.

## 2017-02-14 NOTE — Addendum Note (Signed)
Addended by: [name] on: 02/14/2017 12:59 PM   Modules accepted: Orders

## 2017-02-14 NOTE — Progress Notes (Signed)
[place]  [phone] [phone]     ID: Erika Freeman DOB: 1961-01-25  MR#: [id number]  NIO#:[id number]  Patient Care Team: [name], MD as PCP - General (Obstetrics and Gynecology) [name], MD as Consulting Physician (Nephrology) [name], MD as Consulting Physician (Obstetrics and Gynecology) [name], MD as Consulting Physician (General Surgery) Nettye Flegal, [name], MD as Consulting Physician (Oncology) [name], MD as Consulting Physician (Radiation Oncology) [name], MD as Referring Physician (Surgery) [name], DO (Internal Medicine) OTHER MD:  CHIEF COMPLAINT: Estrogen receptor positive breast cancer  CURRENT TREATMENT: To start anastrozole 02/28/2017  INTERVAL HISTORY:   Erika Freeman returns today for follow-up and treatment of her estrogen receptor positive breast cancer, accompanied by her husband to light.  Since her last visit here she completed her radiation months.  She generally did well with them until the very end when she started to develop  itching and peeling.  She never had pain, with desquamation, or fever.  She did feel a bit more tired, but even so they were able to take a walk yesterday outside when the weather improved.  She had been scheduled for a bone density at the breast center but was unable to get it done because of the weather  REVIEW OF SYSTEMS: Erika Freeman feels pretty much back to normal other than the fact that she is just a little bit more tired than she normally would be.  She does have vaginal dryness issues.  She has hot flashes.  There have been no unusual headaches, visual changes, nausea, vomiting, cough, phlegm production, pleurisy, or change in bowel or bladder habits.  A detailed review of systems today was otherwise stable.  BREAST CANCER HISTORY: From the original intake note:  "Erika Freeman" herself noted a mass in her left breast. She ignored it  because she thought it was related to menstruation. However as it persisted through 1. She brought it to Dr. [name] attention and on 05/10/2016 she underwent left diagnostic mammography with tomography and left breast ultrasonography at the [facility]. The breast density was category C. In the left breast lower central area there was a new circumscribed mass. This was firm and fixed in the lower inner left breast at middle depth. Ultrasonography confirmed a 2.6 cm hypoechoic irregular mass at the 6:30 o'clock radiant 3 cm from the nipple. There was also a 0.5 cm nodule 1.4 cm medial to the mass just described.  Biopsy of the left breast mass in question 05/10/2016 showed([id number]) invasive ductal carcinoma, grade 3, estrogen receptor 70% positive with weak staining intensity, progesterone receptor negative, with no HER-2 amplification, the signals ratio being 1.52 and the number per cell 2.35. The proliferation marker was 90%.  Her subsequent history is as detailed below  OF NOTE: The patient has a history of focal segmental glomerular sclerosis with end-stage renal disease and is status post unrelated donor renal transplant 04/15/2013 as part of appeared donor exchange with her husband donating one kidney, the other harvested from a 55 year old [place] man. She continues on immunosuppression with mycophenolate and tacrolimus.  PAST MEDICAL HISTORY: Past Medical History:  Diagnosis Date  . Anemia   . Breast cancer ([place])   . Cancer [facility])    breast cancer  . Chills   . Chronic kidney disease    resolved kidney transplant - [facility] - transplant -2015  . Cough   . Fibroid tumor   . GERD (gastroesophageal reflux disease)  resolved   . Heart murmur   . Hypertension    DX  30 YRS AGO  . Personal history of chemotherapy   . Umbilical hernia s/p primary repair 01/10/2012 01/24/2012  . Vomiting     PAST SURGICAL HISTORY: Past Surgical History:  Procedure Laterality Date  . BREAST  BIOPSY    . BREAST LUMPECTOMY WITH RADIOACTIVE SEED AND SENTINEL LYMPH NODE BIOPSY Left 11/14/2016   Procedure: LEFT BREAST RADIOACTIVE SEED BRACKETED LUMPECTOMY, LEFT AXILLARY SENTINEL LYMPH NODE BIOPSY WITH BLUE DYE INJECTION;  Surgeon: [name], MD;  Location: [place];  Service: General;  Laterality: Left;  . BREAST SURGERY  2012   left - fibroadenoma  . CAPD INSERTION  01/10/2012   Procedure: LAPAROSCOPIC INSERTION CONTINUOUS AMBULATORY PERITONEAL DIALYSIS  (CAPD) CATHETER;  Surgeon: Adin Hector, MD;  Location: [place];  Service: General;  Laterality: N/A;  LAPAROSCOPIC CAPD CATHETER PLACEMENT OMENTOPEXY  . CESAREAN SECTION  1996  . DILATION AND CURETTAGE OF UTERUS    . KIDNEY TRANSPLANT    . PARATHYROIDECTOMY  2000 - approximate  . PORT-A-CATH REMOVAL Right 11/14/2016   Procedure: REMOVAL PORT-A-CATH;  Surgeon: [name], MD;  Location: [place];  Service: General;  Laterality: Right;  . PORTACATH PLACEMENT Right 05/24/2016   Procedure: INSERTION PORT-A-CATH WITH [place];  Surgeon: [name], MD;  Location: [place];  Service: General;  Laterality: Right;  . RENAL BIOPSY  2011  . TUBAL LIGATION    . UMBILICAL HERNIA REPAIR  01/10/2012   Procedure: HERNIA REPAIR UMBILICAL ADULT;  Surgeon: Adin Hector, MD;  Location: [place];  Service: General;  Laterality: N/A;    FAMILY HISTORY Family History  Problem Relation Age of Onset  . Cancer Maternal Uncle        lung  . Cancer Cousin        breast  . Cancer Cousin        colon  The patient has very little information regarding her father. Her mother is living, 5 years old as of March 2018. The patient had one full brother, diagnosed with prostate cancer in his 48s. The patient has 2 half-brothers and 1 half-sister. There is no history of breast or ovarian cancer in the family to the patient's knowledge   GYNECOLOGIC HISTORY:  Patient's last menstrual period was 04/27/2016 (exact date).  Menarche age 70, first live  birth age 65, the patient is GX P3. Her periods are regular, the last 5 or 6 days, with no heavy days. 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 Her husband white works at home [employer]. Son [name] lives in [place] and as Freight forwarder of a Therapist, art. Son [name] lives in [place] and is a Air traffic controller. Son [name] lives in [place] and is studying based trombone. The patient has 2 grandchildren. She attends a local [employer]:  not in place    HEALTH MAINTENANCE: Social History   Tobacco Use  . Smoking status: Never Smoker  . Smokeless tobacco: Never Used  Substance Use Topics  . Alcohol use: No  . Drug use: No     Colonoscopy:  PAP:  Bone density:   No Known Allergies  Current Outpatient Medications  Medication Sig Dispense Refill  . amLODipine (NORVASC) 10 MG tablet Take 10 mg by mouth daily.    [name] aspirin 81 MG tablet  Take 81 mg by mouth every morning.     . Ferrous Sulfate 134 MG TABS Take 1 tablet by mouth daily.    . hyaluronate sodium (RADIAPLEXRX) GEL Apply 1 application topically 2 (two) times daily.    [name] lovastatin (MEVACOR) 20 MG tablet Take 20 mg by mouth every Monday, Wednesday, and Friday. Takes at bedtime.    . non-metallic deodorant Jethro Poling) MISC Apply 1 application topically daily as needed.    . potassium chloride (K-DUR,KLOR-CON) 10 MEQ tablet Take 1 tablet by mouth daily.    . predniSONE (DELTASONE) 5 MG tablet Take 5 mg by mouth every morning.     . SENSIPAR 60 MG tablet Take 1 tablet by mouth daily. Monday-friday  5  . tacrolimus (PROGRAF) 1 MG capsule Take 3 mg by mouth 2 (two) times daily. Takes 71m in the AM and 235min the PM.    . acetaminophen (TYLENOL) 500 MG tablet Take 1,000 mg by mouth every 6 (six) hours as needed.    . diphenhydrAMINE (BENADRYL) 25 MG tablet Take 25 mg by mouth daily  as needed for allergies.     No current facility-administered medications for this visit.     OBJECTIVE: Middle-aged African-American woman who appears well  Vitals:   02/14/17 1022  BP: 133/86  Pulse: 95  Resp: 18  Temp: 97.8 F (36.6 C)  SpO2: 98%     Body mass index is 27.42 kg/m.   ECOG FS:1 - Symptomatic but completely ambulatory   Sclerae unicteric, pupils round and equal Oropharynx clear and moist No cervical or supraclavicular adenopathy Lungs no rales or rhonchi Heart regular rate and rhythm Abd soft, nontender, positive bowel sounds MSK no focal spinal tenderness, no upper extremity lymphedema Neuro: nonfocal, well oriented, appropriate affect Breasts: The right breast is benign.  The left breast is status post lumpectomy, with some distortion of the contour, and radiation, with some hyperpigmentation.  There is dry desquamation in the inframammary fold and minimally in the left axilla.  There is no evidence of disease recurrence or residual disease.  Both axillae  LAB RESULTS:  CMP     Component Value Date/Time   NA 145 12/06/2016 1015   K 3.2 (L) 12/06/2016 1015   CL 103 11/08/2016 1007   CO2 31 (H) 12/06/2016 1015   GLUCOSE 104 12/06/2016 1015   BUN 12.4 12/06/2016 1015   CREATININE 1.0 12/06/2016 1015   CALCIUM 10.3 12/06/2016 1015   PROT 7.5 12/06/2016 1015   ALBUMIN 4.2 12/06/2016 1015   AST 16 12/06/2016 1015   ALT 14 12/06/2016 1015   ALKPHOS 79 12/06/2016 1015   BILITOT 0.38 12/06/2016 1015   GFRNONAA >60 11/08/2016 1007   GFRAA >60 11/08/2016 1007    No results found for: TOTALPROTELP, ALBUMINELP, A1GS, A2GS, BETS, BETA2SER, GAMS, MSPIKE, SPEI  No results found for: KPAFRELGTCHN, LAMBDASER, KAPLAMBRATIO  Lab Results  Component Value Date   WBC 10.2 02/14/2017   NEUTROABS 8.4 (H) 02/14/2017   HGB 13.1 02/14/2017   HCT 41.0 02/14/2017   MCV 88.0 02/14/2017   PLT 183 02/14/2017      Chemistry      Component Value Date/Time   NA  145 12/06/2016 1015   K 3.2 (L) 12/06/2016 1015   CL 103 11/08/2016 1007   CO2 31 (H) 12/06/2016 1015   BUN 12.4 12/06/2016 1015   CREATININE 1.0 12/06/2016 1015      Component Value Date/Time   CALCIUM 10.3 12/06/2016 1015  ALKPHOS 79 12/06/2016 1015   AST 16 12/06/2016 1015   ALT 14 12/06/2016 1015   BILITOT 0.38 12/06/2016 1015       No results found for: LABCA2  No components found for: [id number]  No results for input(s): INR in the last 168 hours.  Urinalysis    Component Value Date/Time   COLORURINE YELLOW 11/24/2006 1539   APPEARANCEUR CLEAR 11/24/2006 1539   LABSPEC 1.018 11/24/2006 1539   PHURINE 6.0 11/24/2006 1539   GLUCOSEU NEGATIVE 11/24/2006 1539   HGBUR SMALL (A) 11/24/2006 1539   BILIRUBINUR NEGATIVE 11/24/2006 1539   KETONESUR NEGATIVE 11/24/2006 1539   PROTEINUR 100 (A) 11/24/2006 1539   UROBILINOGEN 1.0 11/24/2006 1539   NITRITE NEGATIVE 11/24/2006 1539   LEUKOCYTESUR MODERATE (A) 11/24/2006 1539     STUDIES: No results found.  ELIGIBLE FOR AVAILABLE RESEARCH PROTOCOL: no  ASSESSMENT: 56 y.o. [place], [place] woman status post left breast lower inner quadrant biopsy 05/10/2016 for a clinical  T2 N0, prognostic stage IIB invaside ductal carcinoma, grade 3, weekly estrogen receptor positive, progesterone receptor negative, with no HER-2 amplification, and the MIB-1 at 90%  (a) biopsy of the 0.6 mm satellite at the 6:30 o'clock radiant in the left breast 06/02/2016 showed invasive ductal carcinoma, grade 3, estrogen receptor 20% positive, with weak staining intensity, progesterone receptor negative with no HER-2 amplification, and an MIB-1 of 70% (identical to larger mass)   (1) neoadjuvant chemotherapy consisting of doxorubicin and cyclophosphamide in dose dense fashion 4 started 06/01/2016, completed 07/12/2016  followed by Paclitaxel weekly 12 started 07/26/2016, completed 10/11/2016  (2) definitive surgery 11/14/2016 found a residual pT1a pN0 area  of invasive ductal carcinoma (0.2 mm), with negative margins.  (3) adjuvant radiation 12/20/16-01/17/17 Site/dose:   Left Breast, 50.4 Gy total delivered in 28 fractions   (4) to start anastrozole 02/28/2017  (a) left heel DEXA scan 11/24/2004 was normal  PLAN:  Erika Freeman has completed local therapy for her breast cancer and is now ready to start the final part of her treatment, namely the systemic therapy with antiestrogens.  She understands this is more effective than chemotherapy, lowering the risk by about half.  We discussed the difference between tamoxifen and anastrozole in detail. She understands that anastrozole and the aromatase inhibitors in general work by blocking estrogen production. Accordingly vaginal dryness, decrease in bone density, and of course hot flashes can result. The aromatase inhibitors can also negatively affect the cholesterol profile, although that is a minor effect. One out of 5 women on aromatase inhibitors we will feel "old and achy". This arthralgia/myalgia syndrome, which resembles fibromyalgia clinically, does resolve with stopping the medications. Accordingly this is not a reason to not try an aromatase inhibitor but it is a frequent reason to stop it (in other words 20% of women will not be able to tolerate these medications).  Tamoxifen on the other hand does not block estrogen production. It does not "take away a woman's estrogen". It blocks the estrogen receptor in breast cells. Like anastrozole, it can also cause hot flashes. As opposed to anastrozole, tamoxifen has many estrogen-like effects. It is technically an estrogen receptor modulator. This means that in some tissues tamoxifen works like estrogen-- for example it helps strengthen the bones. It tends to improve the cholesterol profile. It can cause thickening of the endometrial lining, and even endometrial polyps or rarely cancer of the uterus.(The risk of uterine cancer due to tamoxifen is one additional  cancer per thousand women year). It can cause  vaginal wetness or stickiness. It can cause blood clots through this estrogen-like effect--the risk of blood clots with tamoxifen is exactly the same as with birth control pills or hormone replacement.  Neither of these agents causes mood changes or weight gain, despite the popular belief that they can have these side effects. We have data from studies comparing either of these drugs with placebo, and in those cases the control group had the same amount of weight gain and depression as the group that took the drug.  After this discussion we decided we would give anastrozole a try.  She will start 02/28/2017.  She will have her bone density sometime in January before she returns to see me in February.  In the meantime she will start vitamin D at 1000 units daily.  Assuming all is well at the next visit the plan will be to continue anastrozole for a total of 5 years.  She knows to call for any problems that may develop before then.   [name], MD 02/14/17 10:40 AM Medical Oncology and Hematology [facility] [address] [place], [place] [address] Tel. [phone] Fax. [phone]  This document serves as a record of services personally performed by Lurline Del, MD. It was created on her behalf by Margit Banda, a trained medical scribe. The creation of this record is based on the scribe's personal observations and the provider's statements to them. This document has been checked and approved by the attending provider.

## 2017-02-14 NOTE — Telephone Encounter (Signed)
Gave patient avs with appts per 12/18 los.

## 2017-02-15 DIAGNOSIS — Z940 Kidney transplant status: Secondary | ICD-10-CM | POA: Diagnosis not present

## 2017-02-15 DIAGNOSIS — D899 Disorder involving the immune mechanism, unspecified: Secondary | ICD-10-CM | POA: Diagnosis not present

## 2017-02-15 DIAGNOSIS — C50912 Malignant neoplasm of unspecified site of left female breast: Secondary | ICD-10-CM | POA: Diagnosis not present

## 2017-02-15 DIAGNOSIS — Z4822 Encounter for aftercare following kidney transplant: Secondary | ICD-10-CM | POA: Diagnosis not present

## 2017-02-15 DIAGNOSIS — E212 Other hyperparathyroidism: Secondary | ICD-10-CM | POA: Diagnosis not present

## 2017-02-15 DIAGNOSIS — I10 Essential (primary) hypertension: Secondary | ICD-10-CM | POA: Diagnosis not present

## 2017-02-15 DIAGNOSIS — Z9221 Personal history of antineoplastic chemotherapy: Secondary | ICD-10-CM | POA: Diagnosis not present

## 2017-02-15 DIAGNOSIS — Z79899 Other long term (current) drug therapy: Secondary | ICD-10-CM | POA: Diagnosis not present

## 2017-02-24 DIAGNOSIS — T8611 Kidney transplant rejection: Secondary | ICD-10-CM | POA: Diagnosis not present

## 2017-02-24 DIAGNOSIS — Z7952 Long term (current) use of systemic steroids: Secondary | ICD-10-CM | POA: Diagnosis not present

## 2017-02-24 DIAGNOSIS — N051 Unspecified nephritic syndrome with focal and segmental glomerular lesions: Secondary | ICD-10-CM | POA: Diagnosis not present

## 2017-02-24 DIAGNOSIS — Z792 Long term (current) use of antibiotics: Secondary | ICD-10-CM | POA: Diagnosis not present

## 2017-02-24 DIAGNOSIS — Z79899 Other long term (current) drug therapy: Secondary | ICD-10-CM | POA: Diagnosis not present

## 2017-02-24 DIAGNOSIS — Z4822 Encounter for aftercare following kidney transplant: Secondary | ICD-10-CM | POA: Diagnosis not present

## 2017-02-24 DIAGNOSIS — E876 Hypokalemia: Secondary | ICD-10-CM | POA: Diagnosis not present

## 2017-03-01 ENCOUNTER — Ambulatory Visit: Payer: Medicare Other | Admitting: Physical Therapy

## 2017-03-01 ENCOUNTER — Encounter: Payer: Self-pay | Admitting: Oncology

## 2017-03-02 ENCOUNTER — Encounter: Payer: Self-pay | Admitting: Radiation Oncology

## 2017-03-02 ENCOUNTER — Other Ambulatory Visit: Payer: Self-pay

## 2017-03-02 ENCOUNTER — Ambulatory Visit
Admission: RE | Admit: 2017-03-02 | Discharge: 2017-03-02 | Disposition: A | Discharge status: Home / Self Care | Payer: Medicare Other | Source: Ambulatory Visit | Attending: Radiation Oncology | Admitting: Radiation Oncology

## 2017-03-02 VITALS — BP 131/92 | HR 88 | Temp 98.1°F | Resp 18 | Ht 65.0 in | Wt 165.4 lb

## 2017-03-02 DIAGNOSIS — Z79899 Other long term (current) drug therapy: Secondary | ICD-10-CM | POA: Insufficient documentation

## 2017-03-02 DIAGNOSIS — Z7982 Long term (current) use of aspirin: Secondary | ICD-10-CM | POA: Diagnosis not present

## 2017-03-02 DIAGNOSIS — Z170 Estrogen receptor positive status [ER+]: Secondary | ICD-10-CM | POA: Diagnosis not present

## 2017-03-02 DIAGNOSIS — C50312 Malignant neoplasm of lower-inner quadrant of left female breast: Secondary | ICD-10-CM

## 2017-03-02 DIAGNOSIS — C50912 Malignant neoplasm of unspecified site of left female breast: Secondary | ICD-10-CM | POA: Insufficient documentation

## 2017-03-02 NOTE — Progress Notes (Signed)
Pt Erika Freeman is here for a follow-up of the left breast.  Pt denies having any pain or fatigue. Pt states that her skin is looking better after radiation treatment. Pt states that she is using the radiaplex gel as directed.   BP (!) 131/92 (BP Location: Right Arm)   Pulse 88   Temp 98.1 F (36.7 C) (Oral)   Resp 18   Ht 5\' 5"  (1.651 m)   Wt 165 lb 6.4 oz (75 kg)   LMP 04/27/2016 (Exact Date)   SpO2 99%   BMI 27.52 kg/m   Wt Readings from Last 3 Encounters:  03/02/17 165 lb 6.4 oz (75 kg)  02/14/17 164 lb 12.8 oz (74.8 kg)  12/06/16 154 lb 9.6 oz (70.1 kg)

## 2017-03-02 NOTE — Progress Notes (Signed)
Radiation Oncology         [phone] ________________________________  Name: Erika Freeman MRN: [id number]  Date: 03/02/2017  DOB: 06-18-1960  Follow-Up Visit Note  CC: [name], MD  [name], MD    ICD-10-CM   1. Malignant neoplasm of lower-inner quadrant of left breast in female, estrogen receptor positive ([place]) C50.312    Z17.0     Diagnosis:   ypT1a, ypN0, cM0, G3, ER: Positive, PR: Negative, HER2: Negative, Invasive ductal carcinoma of the left breast  Interval Since Last Radiation:  5 weeks  Narrative:  The patient returns today for routine follow-up. She states she is doing well overall and has no complaints at this time. She denies any pain or fatigue. She states she has been using the Radiaplex as directed and states her skin is improving at the site of radiation therapy. She has an appointment with Dr. Jana Hakim next month in February.   She is tolerating anastrozole well at this time.                            ALLERGIES:  has No Known Allergies.  Meds: Current Outpatient Medications  Medication Sig Dispense Refill  . acetaminophen (TYLENOL) 500 MG tablet Take 1,000 mg by mouth every 6 (six) hours as needed.    [name] amLODipine (NORVASC) 10 MG tablet Take 10 mg by mouth daily.    [name] anastrozole (ARIMIDEX) 1 MG tablet Take 1 tablet (1 mg total) by mouth daily. 90 tablet 4  . aspirin 81 MG tablet Take 81 mg by mouth every morning.     . cholecalciferol (VITAMIN D) 1000 units tablet Take 1 tablet (1,000 Units total) by mouth daily. 90 tablet 4  . diphenhydrAMINE (BENADRYL) 25 MG tablet Take 25 mg by mouth daily as needed for allergies.    . Ferrous Sulfate 134 MG TABS Take 1 tablet by mouth daily.    . hyaluronate sodium (RADIAPLEXRX) GEL Apply 1 application topically 2 (two) times daily.    [name] lovastatin (MEVACOR) 20 MG tablet Take 20 mg by mouth every Monday, Wednesday, and Friday. Takes at bedtime.    . non-metallic deodorant Jethro Poling) MISC Apply 1  application topically daily as needed.    . potassium chloride (K-DUR,KLOR-CON) 10 MEQ tablet Take 1 tablet by mouth daily.    . predniSONE (DELTASONE) 5 MG tablet Take 5 mg by mouth every morning.     . SENSIPAR 60 MG tablet Take 1 tablet by mouth daily. Monday-friday  5  . tacrolimus (PROGRAF) 1 MG capsule Take 3 mg by mouth 2 (two) times daily. Takes 63m in the AM and 262min the PM.     No current facility-administered medications for this encounter.     Physical Findings: The patient is in no acute distress. Patient is alert and oriented.  height is _0  (1.651 m) and weight is 165 lb 6.4 oz (75 kg). Her oral temperature is 98.1 F (36.7 C). Her blood pressure is 131/92 (abnormal) and her pulse is 88. Her respiration is 18 and oxygen saturation is 99%. .    Lungs are clear to auscultation bilaterally. Heart has regular rate and rhythm. No palpable cervical, supraclavicular, or axillary adenopathy. Abdomen soft, non-tender, normal bowel sounds. Right breast with no palpable mass or nipple discharge.  Left breast with hyperpigmentation changes noted. Mild edema in the breast area. No palpable mass. Skin is well-healed.   Lab Findings:  Lab Results  Component Value Date   WBC 10.2 02/14/2017   HGB 13.1 02/14/2017   HCT 41.0 02/14/2017   MCV 88.0 02/14/2017   PLT 183 02/14/2017    Radiographic Findings: No results found.  Impression:  ypT1a, ypN0, cM0, G3, ER: Positive, PR: Negative, HER2: Negative, Invasive ductal carcinoma of the left breast  The patient is recovering from the effects of radiation. No evidence of recurrence on clinical exam.  Plan:  PRN follow up in radiation oncology. Patient will continue close follow up with medical oncology and continue on Anastrozole.   ____________________________________  -----------------------------------  Blair Promise, PhD, MD   This document serves as a record of services personally performed by [name], MD. It was  created on his behalf by Marlowe Kays, a trained medical scribe. The creation of this record is based on the scribe's personal observations and the provider's statements to them. This document has been checked and approved by the attending provider.

## 2017-03-15 DIAGNOSIS — Z9889 Other specified postprocedural states: Secondary | ICD-10-CM | POA: Diagnosis not present

## 2017-03-15 DIAGNOSIS — Z79899 Other long term (current) drug therapy: Secondary | ICD-10-CM | POA: Diagnosis not present

## 2017-03-15 DIAGNOSIS — Z9221 Personal history of antineoplastic chemotherapy: Secondary | ICD-10-CM | POA: Diagnosis not present

## 2017-03-15 DIAGNOSIS — E212 Other hyperparathyroidism: Secondary | ICD-10-CM | POA: Diagnosis not present

## 2017-03-15 DIAGNOSIS — I10 Essential (primary) hypertension: Secondary | ICD-10-CM | POA: Diagnosis not present

## 2017-03-15 DIAGNOSIS — D899 Disorder involving the immune mechanism, unspecified: Secondary | ICD-10-CM | POA: Diagnosis not present

## 2017-03-15 DIAGNOSIS — Z5181 Encounter for therapeutic drug level monitoring: Secondary | ICD-10-CM | POA: Diagnosis not present

## 2017-03-15 DIAGNOSIS — Z48288 Encounter for aftercare following multiple organ transplant: Secondary | ICD-10-CM | POA: Diagnosis not present

## 2017-03-15 DIAGNOSIS — C50912 Malignant neoplasm of unspecified site of left female breast: Secondary | ICD-10-CM | POA: Diagnosis not present

## 2017-03-15 DIAGNOSIS — Z86018 Personal history of other benign neoplasm: Secondary | ICD-10-CM | POA: Diagnosis not present

## 2017-03-15 DIAGNOSIS — Z4822 Encounter for aftercare following kidney transplant: Secondary | ICD-10-CM | POA: Diagnosis not present

## 2017-03-15 DIAGNOSIS — Z940 Kidney transplant status: Secondary | ICD-10-CM | POA: Diagnosis not present

## 2017-04-03 ENCOUNTER — Telehealth: Payer: Self-pay

## 2017-04-03 NOTE — Telephone Encounter (Signed)
Attempted to return pt call, left VM for patient.  Cyndia Bent RN

## 2017-04-10 NOTE — Progress Notes (Signed)
[place]  [phone] [phone]     ID: Erika Freeman DOB: 1960-08-14  MR#: [id number]  NLZ#:[id number]  Patient Care Team: [name], MD as PCP - General (Obstetrics and Gynecology) [name], MD as Consulting Physician (Nephrology) [name], MD as Consulting Physician (Obstetrics and Gynecology) [name], MD as Consulting Physician (General Surgery) Yuleidy Rappleye, [name], MD as Consulting Physician (Oncology) [name], MD as Consulting Physician (Radiation Oncology) [name], MD as Referring Physician (Surgery) [name], DO (Internal Medicine) OTHER MD:  CHIEF COMPLAINT: Estrogen receptor positive breast cancer  CURRENT TREATMENT: anastrozole   INTERVAL HISTORY:   Erika Freeman returns today for follow-up and treatment of her estrogen receptor positive breast cancer, accompanied by her husband today. She reports that she is doing well overall.   She started anastrozole and also vitamin D supplementation 02/28/2017. She hasn't had any issues with the vitamin D supplement. She reports that on anastrozole, she has hot flashes at night and in close proximity with individuals. She denies any other issues while on anastrozole. She pays approximately $3   She notes that Dr. Violeta Gelinas at [facility] is concerned about her having a bone density scan in order to not affect the kidneys with radiation. She is not using the radiaplex lotion as much at this time but reports that she uses coco butter.   REVIEW OF SYSTEMS: Erika Freeman reports that for exercise, she walks with her mother, although she notes that this is not as consistent as she would like. She is now taking 20 mg of potassium as informed by her providers at [facility]. She denies unusual headaches, visual changes, nausea, vomiting, or dizziness. There has been no unusual cough, phlegm production, or pleurisy. This been no change in bowel or bladder habits.  She denies unexplained fatigue or unexplained weight loss, bleeding, rash, or fever. A detailed review of systems was otherwise stable.    BREAST CANCER HISTORY: From the original intake note:  "Erika Freeman" herself noted a mass in her left breast. She ignored it because she thought it was related to menstruation. However as it persisted through 1. She brought it to Dr. [name] attention and on 05/10/2016 she underwent left diagnostic mammography with tomography and [facility]. The breast density was category C. In the left breast lower central area there was a new circumscribed mass. This was firm and fixed in the lower inner left breast at middle depth. Ultrasonography confirmed a 2.6 cm hypoechoic irregular mass at the 6:30 o'clock radiant 3 cm from the nipple. There was also a 0.5 cm nodule 1.4 cm medial to the mass just described.  Biopsy of the left breast mass in question 05/10/2016 showed([id number]) invasive ductal carcinoma, grade 3, estrogen receptor 70% positive with weak staining intensity, progesterone receptor negative, with no HER-2 amplification, the signals ratio being 1.52 and the number per cell 2.35. The proliferation marker was 90%.  Her subsequent history is as detailed below  OF NOTE: The patient has a history of focal segmental glomerular sclerosis with end-stage renal disease and is status post unrelated donor renal transplant 04/15/2013 as part of appeared donor exchange with her husband donating one kidney, the other harvested from a 56 year old [place] man. She continues on immunosuppression with mycophenolate and tacrolimus.  PAST MEDICAL HISTORY: Past Medical History:  Diagnosis Date  . Anemia   . Breast cancer ([place])   . Cancer [facility])    breast cancer  .  Chills   . Chronic kidney disease    resolved kidney transplant - [facility] - transplant -2015  . Cough   . Fibroid tumor   . GERD (gastroesophageal reflux disease)     resolved   . Heart murmur   . History of radiation therapy 12/20/16-01/17/17   left breast 50.4 gy in 28 fractions  . Hypertension    DX  30 YRS AGO  . Personal history of chemotherapy   . Umbilical hernia s/p primary repair 01/10/2012 01/24/2012  . Vomiting     PAST SURGICAL HISTORY: Past Surgical History:  Procedure Laterality Date  . BREAST BIOPSY    . BREAST LUMPECTOMY WITH RADIOACTIVE SEED AND SENTINEL LYMPH NODE BIOPSY Left 11/14/2016   Procedure: LEFT BREAST RADIOACTIVE SEED BRACKETED LUMPECTOMY, LEFT AXILLARY SENTINEL LYMPH NODE BIOPSY WITH BLUE DYE INJECTION;  Surgeon: [name], MD;  Location: [place];  Service: General;  Laterality: Left;  . BREAST SURGERY  2012   left - fibroadenoma  . CAPD INSERTION  01/10/2012   Procedure: LAPAROSCOPIC INSERTION CONTINUOUS AMBULATORY PERITONEAL DIALYSIS  (CAPD) CATHETER;  Surgeon: Adin Hector, MD;  Location: [place];  Service: General;  Laterality: N/A;  LAPAROSCOPIC CAPD CATHETER PLACEMENT OMENTOPEXY  . CESAREAN SECTION  1996  . DILATION AND CURETTAGE OF UTERUS    . KIDNEY TRANSPLANT    . PARATHYROIDECTOMY  2000 - approximate  . PORT-A-CATH REMOVAL Right 11/14/2016   Procedure: REMOVAL PORT-A-CATH;  Surgeon: [name], MD;  Location: [place];  Service: General;  Laterality: Right;  . PORTACATH PLACEMENT Right 05/24/2016   Procedure: INSERTION PORT-A-CATH WITH [place];  Surgeon: [name], MD;  Location: [place];  Service: General;  Laterality: Right;  . RENAL BIOPSY  2011  . TUBAL LIGATION    . UMBILICAL HERNIA REPAIR  01/10/2012   Procedure: HERNIA REPAIR UMBILICAL ADULT;  Surgeon: Adin Hector, MD;  Location: [place];  Service: General;  Laterality: N/A;    FAMILY HISTORY Family History  Problem Relation Age of Onset  . Cancer Maternal Uncle        lung  . Cancer Cousin        breast  . Cancer Cousin        colon  The patient has very little information regarding her father. Her mother is living, 35 years  old as of March 2018. The patient had one full brother, diagnosed with prostate cancer in his 12s. The patient has 2 half-brothers and 1 half-sister. There is no history of breast or ovarian cancer in the family to the patient's knowledge   GYNECOLOGIC HISTORY:  Patient's last menstrual period was 04/27/2016 (exact date).  Menarche age 81, first live birth age 42, the patient is GX P3. Her periods are regular, the last 5 or 6 days, with no heavy days. 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 Her husband white works at home [employer]. Son [name] lives in [place] and as Freight forwarder of a Therapist, art. Son [name] lives in nights [place] and is a Air traffic controller. Son [name] lives in [place] and is studying based trombone. The patient has 2 grandchildren. She attends a local [employer]:  not in place    HEALTH MAINTENANCE: Social History   Tobacco Use  . Smoking status: Never Smoker  . Smokeless tobacco: Never Used  Substance Use Topics  .  Alcohol use: No  . Drug use: No     Colonoscopy:  PAP:  Bone density:   No Known Allergies  Current Outpatient Medications  Medication Sig Dispense Refill  . acetaminophen (TYLENOL) 500 MG tablet Take 1,000 mg by mouth every 6 (six) hours as needed.    [name] amLODipine (NORVASC) 10 MG tablet Take 10 mg by mouth daily.    [name] anastrozole (ARIMIDEX) 1 MG tablet Take 1 tablet (1 mg total) by mouth daily. 90 tablet 4  . aspirin 81 MG tablet Take 81 mg by mouth every morning.     . cholecalciferol (VITAMIN D) 1000 units tablet Take 1 tablet (1,000 Units total) by mouth daily. 90 tablet 4  . diphenhydrAMINE (BENADRYL) 25 MG tablet Take 25 mg by mouth daily as needed for allergies.    . Ferrous Sulfate 134 MG TABS Take 1 tablet by mouth daily.    . hyaluronate sodium (RADIAPLEXRX) GEL Apply 1 application  topically 2 (two) times daily.    [name] lovastatin (MEVACOR) 20 MG tablet Take 20 mg by mouth every Monday, Wednesday, and Friday. Takes at bedtime.    . non-metallic deodorant Jethro Poling) MISC Apply 1 application topically daily as needed.    . potassium chloride (K-DUR,KLOR-CON) 10 MEQ tablet Take 1 tablet by mouth daily.    . predniSONE (DELTASONE) 5 MG tablet Take 5 mg by mouth every morning.     . SENSIPAR 60 MG tablet Take 1 tablet by mouth daily. Monday-friday  5  . tacrolimus (PROGRAF) 1 MG capsule Take 3 mg by mouth 2 (two) times daily. Takes 35m in the AM and 22min the PM.     No current facility-administered medications for this visit.     OBJECTIVE: Middle-aged African-American woman in no acute distress  Vitals:   04/17/17 0905  BP: 139/90  Pulse: 84  Resp: 20  Temp: 98.6 F (37 C)  SpO2: 98%     Body mass index is 28.34 kg/m.   ECOG FS:0 - Asymptomatic   Sclerae unicteric, EOMs intact Oropharynx clear and moist No cervical or supraclavicular adenopathy Lungs no rales or rhonchi Heart regular rate and rhythm Abd soft, nontender, positive bowel sounds MSK no focal spinal tenderness, no upper extremity lymphedema Neuro: nonfocal, well oriented, appropriate affect Breasts: The right breast is unremarkable.  The left breast is status post lumpectomy.  There is still minimal hyperpigmentation.  The cosmetic result is excellent.  There is no evidence of disease recurrence.  Both axillae are benign.  LAB RESULTS:  CMP     Component Value Date/Time   NA 142 02/14/2017 1012   K 2.8 (LL) 02/14/2017 1012   CL 103 11/08/2016 1007   CO2 30 (H) 02/14/2017 1012   GLUCOSE 104 02/14/2017 1012   BUN 14.1 02/14/2017 1012   CREATININE 1.2 (H) 02/14/2017 1012   CALCIUM 9.6 02/14/2017 1012   PROT 7.8 02/14/2017 1012   ALBUMIN 4.4 02/14/2017 1012   AST 15 02/14/2017 1012   ALT 15 02/14/2017 1012   ALKPHOS 72 02/14/2017 1012   BILITOT 0.53 02/14/2017 1012   GFRNONAA >60 11/08/2016  1007   GFRAA >60 11/08/2016 1007    No results found for: TOTALPROTELP, ALBUMINELP, A1GS, A2GS, BETS, BETA2SER, GAMS, MSPIKE, SPEI  No results found for: KPAFRELGTCHN, LAMBDASER, KAPLAMBRATIO  Lab Results  Component Value Date   WBC 8.0 04/17/2017   NEUTROABS 6.0 04/17/2017   HGB 13.1 04/17/2017   HCT 39.8 04/17/2017   MCV 84.5 04/17/2017  PLT 191 04/17/2017      Chemistry      Component Value Date/Time   NA 142 02/14/2017 1012   K 2.8 (LL) 02/14/2017 1012   CL 103 11/08/2016 1007   CO2 30 (H) 02/14/2017 1012   BUN 14.1 02/14/2017 1012   CREATININE 1.2 (H) 02/14/2017 1012      Component Value Date/Time   CALCIUM 9.6 02/14/2017 1012   ALKPHOS 72 02/14/2017 1012   AST 15 02/14/2017 1012   ALT 15 02/14/2017 1012   BILITOT 0.53 02/14/2017 1012       No results found for: LABCA2  No components found for: QIHKVQ259  No results for input(s): INR in the last 168 hours.  Urinalysis    Component Value Date/Time   COLORURINE YELLOW 11/24/2006 [place] 11/24/2006 1539   LABSPEC 1.018 11/24/2006 1539   PHURINE 6.0 11/24/2006 1539   GLUCOSEU NEGATIVE 11/24/2006 1539   HGBUR SMALL (A) 11/24/2006 1539   BILIRUBINUR NEGATIVE 11/24/2006 1539   KETONESUR NEGATIVE 11/24/2006 1539   PROTEINUR 100 (A) 11/24/2006 1539   UROBILINOGEN 1.0 11/24/2006 1539   NITRITE NEGATIVE 11/24/2006 1539   LEUKOCYTESUR MODERATE (A) 11/24/2006 1539     STUDIES: She is due for mammography late April of this year.  She is also due for a bone density scan  ELIGIBLE FOR AVAILABLE RESEARCH PROTOCOL: no  ASSESSMENT: 57 y.o. Erika Freeman, [place] woman status post left breast lower inner quadrant biopsy 05/10/2016 for a clinical  T2 N0, prognostic stage IIB invaside ductal carcinoma, grade 3, weekly estrogen receptor positive, progesterone receptor negative, with no HER-2 amplification, and the MIB-1 at 90%  (a) biopsy of the 0.6 mm satellite at the 6:30 o'clock radiant in the left breast  06/02/2016 showed invasive ductal carcinoma, grade 3, estrogen receptor 20% positive, with weak staining intensity, progesterone receptor negative with no HER-2 amplification, and an MIB-1 of 70% (identical to larger mass)   (1) neoadjuvant chemotherapy consisting of doxorubicin and cyclophosphamide in dose dense fashion 4 started 06/01/2016, completed 07/12/2016  followed by Paclitaxel weekly 12 started 07/26/2016, completed 10/11/2016  (2) definitive surgery 11/14/2016 found a residual pT1a pN0 area of invasive ductal carcinoma (0.2 mm), with negative margins.  (3) adjuvant radiation 12/20/16-01/17/17 Site/dose:   Left Breast, 50.4 Gy total delivered in 28 fractions   (4) started anastrozole 02/28/2017  (a) left heel DEXA scan 11/24/2004 was normal  PLAN:  Erika Freeman has made an excellent recovery from her chemotherapy.  She is luckily tolerating anastrozole well.  The plan will be to continue that for a total of 5 years.  She is already on vitamin D supplementation.  I have encouraged her to increase her walking program.  She will need a baseline bone density, but there is some concern about possible radiation to the transplanted kidneys.  I have a note out to the breast center to see if we need to do any shielding or otherwise modified the procedure.  Erika Freeman is interested in the pelvic health program.  Hopefully she will get that accomplished within the next few weeks.  She had many questions regarding nails, hair, teeth cleaning, etc.  A good way to think about things is that she is now back to normal but not quite.  I would tend to "go natural" with her hair and nails and avoid harsh coloring or permanents or any pedicure or manicure.  She certainly can have her teeth cleaned at any point and her lab work today is excellent.  I gave her a copy so she can bring it to her dentist.  She will see Dr. Milta Deiters most likely in May.  She will see me again in August.  She knows to call for any  problems that may develop before the next visit here.    Ermine Stebbins, [name], MD  04/17/17 9:29 AM Medical Oncology and Hematology [facility] [address] [place], [place] [address] Tel. [phone]    Fax. [phone]    This document serves as a record of services personally performed by Lurline Del, MD. It was created on his behalf by Steva Colder, a trained medical scribe. The creation of this record is based on the scribe's personal observations and the provider's statements to them.   I have reviewed the above documentation for accuracy and completeness, and I agree with the above.

## 2017-04-12 DIAGNOSIS — E212 Other hyperparathyroidism: Secondary | ICD-10-CM | POA: Diagnosis not present

## 2017-04-12 DIAGNOSIS — E089 Diabetes mellitus due to underlying condition without complications: Secondary | ICD-10-CM | POA: Diagnosis not present

## 2017-04-12 DIAGNOSIS — C50919 Malignant neoplasm of unspecified site of unspecified female breast: Secondary | ICD-10-CM | POA: Diagnosis not present

## 2017-04-12 DIAGNOSIS — R300 Dysuria: Secondary | ICD-10-CM | POA: Insufficient documentation

## 2017-04-12 DIAGNOSIS — Z940 Kidney transplant status: Secondary | ICD-10-CM | POA: Diagnosis not present

## 2017-04-12 DIAGNOSIS — Z4822 Encounter for aftercare following kidney transplant: Secondary | ICD-10-CM | POA: Diagnosis not present

## 2017-04-12 DIAGNOSIS — Z9221 Personal history of antineoplastic chemotherapy: Secondary | ICD-10-CM | POA: Diagnosis not present

## 2017-04-12 DIAGNOSIS — E213 Hyperparathyroidism, unspecified: Secondary | ICD-10-CM | POA: Diagnosis not present

## 2017-04-12 DIAGNOSIS — Z79899 Other long term (current) drug therapy: Secondary | ICD-10-CM | POA: Diagnosis not present

## 2017-04-12 DIAGNOSIS — I10 Essential (primary) hypertension: Secondary | ICD-10-CM | POA: Diagnosis not present

## 2017-04-12 DIAGNOSIS — Z853 Personal history of malignant neoplasm of breast: Secondary | ICD-10-CM | POA: Diagnosis not present

## 2017-04-12 DIAGNOSIS — D8989 Other specified disorders involving the immune mechanism, not elsewhere classified: Secondary | ICD-10-CM | POA: Diagnosis not present

## 2017-04-17 ENCOUNTER — Inpatient Hospital Stay: Payer: Medicare Other

## 2017-04-17 ENCOUNTER — Telehealth: Payer: Self-pay | Admitting: Oncology

## 2017-04-17 ENCOUNTER — Inpatient Hospital Stay: Payer: Medicare Other | Attending: Oncology | Admitting: Oncology

## 2017-04-17 VITALS — BP 139/90 | HR 84 | Temp 98.6°F | Resp 20 | Ht 65.0 in | Wt 170.3 lb

## 2017-04-17 DIAGNOSIS — C50312 Malignant neoplasm of lower-inner quadrant of left female breast: Secondary | ICD-10-CM | POA: Insufficient documentation

## 2017-04-17 DIAGNOSIS — Z170 Estrogen receptor positive status [ER+]: Principal | ICD-10-CM

## 2017-04-17 DIAGNOSIS — Z940 Kidney transplant status: Secondary | ICD-10-CM | POA: Insufficient documentation

## 2017-04-17 DIAGNOSIS — Z79811 Long term (current) use of aromatase inhibitors: Secondary | ICD-10-CM

## 2017-04-17 DIAGNOSIS — N185 Chronic kidney disease, stage 5: Secondary | ICD-10-CM

## 2017-04-17 LAB — CBC WITH DIFFERENTIAL/PLATELET
Basophils Absolute: 0 10*3/uL (ref 0.0–0.1)
Basophils Relative: 1 %
EOS PCT: 3 %
Eosinophils Absolute: 0.2 10*3/uL (ref 0.0–0.5)
HCT: 39.8 % (ref 34.8–46.6)
Hemoglobin: 13.1 g/dL (ref 11.6–15.9)
LYMPHS ABS: 1.2 10*3/uL (ref 0.9–3.3)
LYMPHS PCT: 15 %
MCH: 27.8 pg (ref 25.1–34.0)
MCHC: 32.9 g/dL (ref 31.5–36.0)
MCV: 84.5 fL (ref 79.5–101.0)
MONO ABS: 0.6 10*3/uL (ref 0.1–0.9)
MONOS PCT: 7 %
Neutro Abs: 6 10*3/uL (ref 1.5–6.5)
Neutrophils Relative %: 74 %
PLATELETS: 191 10*3/uL (ref 145–400)
RBC: 4.71 MIL/uL (ref 3.70–5.45)
RDW: 13.6 % (ref 11.2–14.5)
WBC: 8 10*3/uL (ref 3.9–10.3)

## 2017-04-17 LAB — COMPREHENSIVE METABOLIC PANEL
ALBUMIN: 4.3 g/dL (ref 3.5–5.0)
ALT: 18 U/L (ref 0–55)
AST: 21 U/L (ref 5–34)
Alkaline Phosphatase: 74 U/L (ref 40–150)
Anion gap: 11 (ref 3–11)
BUN: 13 mg/dL (ref 7–26)
CHLORIDE: 104 mmol/L (ref 98–109)
CO2: 28 mmol/L (ref 22–29)
Calcium: 10.4 mg/dL (ref 8.4–10.4)
Creatinine, Ser: 1.12 mg/dL — ABNORMAL HIGH (ref 0.60–1.10)
GFR calc Af Amer: >60 mL/min (ref >60)
GFR calc non Af Amer: 54 mL/min — ABNORMAL LOW (ref >60)
GLUCOSE: 104 mg/dL (ref 70–140)
POTASSIUM: 2.9 mmol/L — AB (ref 3.5–5.1)
Sodium: 143 mmol/L (ref 136–145)
Total Bilirubin: 0.4 mg/dL (ref 0.2–1.2)
Total Protein: 8 g/dL (ref 6.4–8.3)

## 2017-04-17 NOTE — Telephone Encounter (Signed)
Gave avs and calendar for august  °

## 2017-04-18 NOTE — Progress Notes (Signed)
I checked with Dr. Rosana Hoes at the breast center regarding the amount of radiation involved in a DEXA scan.  I am copying his answer below.  Gus,  DXA scans are extremely low dose and most studies indicate they are equivalent to a day or so of natural background radiation. I don't see any reason to be concerned about the patient receiving a dxa exam. Let me know if there are additional questions. I also spoke with Dr. Owens Shark who oversees dxa for our group and she agrees.  Best,  Dian Situ    Accordingly I think it is safe for Erika Freeman to go ahead and have a bone density, which will be helpful as we treat her with aromatase inhibitors for the next several year I am copying her transplant doctors to make sure they are in the loop.

## 2017-05-03 ENCOUNTER — Encounter: Payer: Self-pay | Admitting: Physical Therapy

## 2017-05-03 ENCOUNTER — Other Ambulatory Visit: Payer: Self-pay

## 2017-05-03 ENCOUNTER — Ambulatory Visit: Payer: Medicare Other | Attending: Oncology | Admitting: Physical Therapy

## 2017-05-03 DIAGNOSIS — R278 Other lack of coordination: Secondary | ICD-10-CM | POA: Insufficient documentation

## 2017-05-03 DIAGNOSIS — M6281 Muscle weakness (generalized): Secondary | ICD-10-CM | POA: Diagnosis not present

## 2017-05-03 NOTE — Therapy (Signed)
[facility] [address] [place], [place], [address] Phone: [phone]   Fax:  [phone]  Physical Therapy Evaluation  Patient Details  Name: Erika Freeman MRN: [id number] Date of Birth: [birth year] Referring Provider: Dr. Lurline Del   Encounter Date: 05/03/2017  PT End of Session - 05/03/17 0915    Visit Number  1    PT Start Time  0830    PT Stop Time  0915    PT Time Calculation (min)  45 min    Activity Tolerance  Patient tolerated treatment well    Behavior During Therapy  [facility] for tasks assessed/performed       Past Medical History:  Diagnosis Date  . Anemia   . Breast cancer ([place])   . Cancer [facility])    breast cancer  . Chills   . Chronic kidney disease    resolved kidney transplant - [facility] - transplant -2015  . Cough   . Fibroid tumor   . GERD (gastroesophageal reflux disease)    resolved   . Heart murmur   . History of radiation therapy 12/20/16-01/17/17   left breast 50.4 gy in 28 fractions  . Hypertension    DX  30 YRS AGO  . Personal history of chemotherapy   . Umbilical hernia s/p primary repair 01/10/2012 01/24/2012  . Vomiting     Past Surgical History:  Procedure Laterality Date  . BREAST BIOPSY    . BREAST LUMPECTOMY WITH RADIOACTIVE SEED AND SENTINEL LYMPH NODE BIOPSY Left 11/14/2016   Procedure: LEFT BREAST RADIOACTIVE SEED BRACKETED LUMPECTOMY, LEFT AXILLARY SENTINEL LYMPH NODE BIOPSY WITH BLUE DYE INJECTION;  Surgeon: [name], MD;  Location: [place];  Service: General;  Laterality: Left;  . BREAST SURGERY  2012   left - fibroadenoma  . CAPD INSERTION  01/10/2012   Procedure: LAPAROSCOPIC INSERTION CONTINUOUS AMBULATORY PERITONEAL DIALYSIS  (CAPD) CATHETER;  Surgeon: Adin Hector, MD;  Location: [place];  Service: General;  Laterality: N/A;  LAPAROSCOPIC CAPD CATHETER PLACEMENT OMENTOPEXY  . CESAREAN SECTION  1996  . DILATION AND CURETTAGE OF UTERUS    .  KIDNEY TRANSPLANT    . PARATHYROIDECTOMY  2000 - approximate  . PORT-A-CATH REMOVAL Right 11/14/2016   Procedure: REMOVAL PORT-A-CATH;  Surgeon: [name], MD;  Location: [place];  Service: General;  Laterality: Right;  . PORTACATH PLACEMENT Right 05/24/2016   Procedure: INSERTION PORT-A-CATH WITH [place];  Surgeon: [name], MD;  Location: [place];  Service: General;  Laterality: Right;  . RENAL BIOPSY  2011  . TUBAL LIGATION    . UMBILICAL HERNIA REPAIR  01/10/2012   Procedure: HERNIA REPAIR UMBILICAL ADULT;  Surgeon: Adin Hector, MD;  Location: [place];  Service: General;  Laterality: N/A;    There were no vitals filed for this visit.   Subjective Assessment - 05/03/17 0832    Subjective  Patient was suggested to come to therapy due to being on the Anastorzole.  I wanted to learn about the vaginal dryness.     Patient Stated Goals  education on vaginal health    Currently in Pain?  Yes    Pain Score  6     Pain Location  Vagina    Pain Orientation  Mid    Pain Descriptors / Indicators  Sharp    Pain Type  Acute pain    Pain Onset  More than a month ago    Pain Frequency  Intermittent  Aggravating Factors   intercourse with initial penetration, urination after intercourse there is burning    Pain Relieving Factors  no intercoures    Multiple Pain Sites  No         [facility] PT Assessment - 05/03/17 0001      Assessment   Medical Diagnosis  C50.312, Z17. Malignant neoplasm of lower-inner quadrant of left breast in female , estrogen receptor positive    Referring Provider  Dr. Sarajane Jews Magrinat    Onset Date/Surgical Date  05/10/16    Prior Therapy  None      Precautions   Precautions  Other (comment)    Precaution Comments  Breast cancer estrogen positive      Restrictions   Weight Bearing Restrictions  No      Balance Screen   Has the patient fallen in the past 6 months  No    Has the patient had a decrease in activity level because of a fear of falling?   No     Is the patient reluctant to leave their home because of a fear of falling?   No      Home Film/video editor residence      Prior Function   Level of Independence  Independent    Leisure  not exercise      Cognition   Overall Cognitive Status  Within Functional Limits for tasks assessed      Observation/Other Assessments   Focus on Therapeutic Outcomes (FOTO)   patient is limited due to fatique      Posture/Postural Control   Posture/Postural Control  No significant limitations      ROM / Strength   AROM / PROM / Strength  AROM;PROM;Strength      AROM   Overall AROM Comments  Lumbar ROM is full      PROM   Overall PROM   Within functional limits for tasks performed      Strength   Overall Strength  Within functional limits for tasks performed      Transfers   Transfers  Not assessed      Ambulation/Gait   Ambulation/Gait  No             Objective measurements completed on examination: See above findings.    Pelvic Floor Special Questions - 05/03/17 0001    Currently Sexually Active  Yes    Is this Painful  Yes    Marinoff Scale  discomfort that does not affect completion    Urinary Leakage  Yes    Activities that cause leaking  With strong urge;Coughing    Urinary urgency  Yes due to medicine, has to drink alot of water    Skin Integrity  Intact dry    Pelvic Floor Internal Exam  Patient confirms identification and approves PT to assess muscles and treatment    Exam Type  Vaginal    Palpation  tightness at the posterior vulva               PT Education - 05/03/17 0905    Education provided  Yes    Education Details  soft tissue work to perineum, information on lubricants and moisturizers; programs to exercise for cancer survivors; pelvic floor ; hip stretches    Person(s) Educated  Patient    Methods  Explanation;Demonstration;Verbal cues;Handout    Comprehension  Returned demonstration;Verbalized understanding           PT Long Term Goals -  05/03/17 0923      PT LONG TERM GOAL #1   Title  understand how to use vaginal moisturizers and lubricants to work with vaginal dryness     Time  1    Period  Days    Status  Achieved    Target Date  05/03/17      PT LONG TERM GOAL #2   Title  understand how to perform pelvic exercises to reduce urinary leakage    Time  1    Period  Days    Status  Achieved    Target Date  05/03/17      PT LONG TERM GOAL #3   Title  understand how to massage the perineal area to reduce pain with initial penetration of the penis    Time  1    Period  Days    Status  Achieved    Target Date  05/03/17             Plan - 05/03/17 0916    Clinical Impression Statement  Patient is a 58 year old female s/p breast cancer estrogen positive 05/10/2016.  Patient is now taking Anastorzole and started 02/28/2017.  Patient reports 7/10 pain with initial penetration of the vaginal during intercourse.  Pelvic floor strength is 4/5.Patient will leak urine on occasion with cough or when she is rushing to the bathroom.  Patient has to drink alot of water due to being a kidney transplant recipient.  Patient reports she has not exercised since she was diagnosed  with breast cancer.  Patient was educated on hip stretches, where to go for exercise, vaginal lubricants and moisturizers and how to massage the perineal area.      History and Personal Factors relevant to plan of care:  Kidney transplant; s/p Breast cancer estrogen positive 05/10/2016; Anastrozole 02/28/17    Clinical Presentation  Stable    Clinical Presentation due to:  stable condition    Clinical Decision Making  Low    Rehab Potential  Excellent    Clinical Impairments Affecting Rehab Potential  Kidney transplant; s/p Breast cancer estrogen positive 05/10/2016; Anastrozole 02/28/17    PT Frequency  One time visit    PT Duration  Other (comment) 1 time visit with eval and treatment    PT Treatment/Interventions  Patient/family  education;Therapeutic exercise;Therapeutic activities    PT Next Visit Plan  HEP to inform patient on vaginal mositurizers, vaginal lubricants, perineal soft tissue work, education on the pelvic floor and how estrogen affects it; pelvic floor exercise    PT Home Exercise Plan  Current HEP that was given    Consulted and Agree with Plan of Care  Patient       Patient will benefit from skilled therapeutic intervention in order to improve the following deficits and impairments:  Pain, Decreased strength, Decreased coordination  Visit Diagnosis: Muscle weakness (generalized) - Plan: PT plan of care cert/re-cert  Other lack of coordination - Plan: PT plan of care cert/re-cert     Problem List Patient Active Problem List   Diagnosis Date Noted  . Malignant neoplasm of lower-inner quadrant of left breast in female, estrogen receptor positive ([place]) 05/16/2016  . Umbilical hernia s/p primary repair 01/10/2012 01/24/2012  . Constipation, chronic 01/24/2012  . FSGS (focal segmental glomerulosclerosis) 11/23/2011  . Uterine fibromyoma 11/23/2011  . Chronic kidney disease, stage V (very severe) ([place])   . Hypertension     Earlie Counts, PT 05/03/17 9:28 AM   [facility] [address], [place], [place], [address] Phone: [phone]   Fax:  [phone]  Name: Erika Freeman MRN: [id number] Date of Birth: [birth year]

## 2017-05-03 NOTE — Patient Instructions (Addendum)
[place] program at the [employer] with your stretching program every morning Return to the [employer], Supine    Lie supine, legs bent, feet flat. Raise one bent leg and, grasping ankle with both hands, pull leg toward opposite shoulder. Hold _30__ seconds.  Repeat _2__ times per session. Do _1__ sessions per day. Perform with other leg straight.  Copyright  VHI. All rights reserved.  Butterfly, Supine    Lie on back, feet together. Lower knees toward floor. Hold _30__ seconds. Repeat _2__ times per session. Do __1_ sessions per day.  Copyright  VHI. All rights reserved.  Supine Knee-to-Chest, Unilateral    Lie on back, hands clasped behind one knee. Pull knee in toward chest until a comfortable stretch is felt in lower back and buttocks. Hold _30__ seconds.  Repeat _2__ times per session. Do _1__ sessions per day.  Copyright  VHI. All rights reserved.  Supine With Rotation    Lie, back flat, legs bent, feet together. Rotate knees to one side. Hold _30__ seconds. Repeat to other side. Repeat __2_ times per session. Do _1__ sessions per day.  Copyright  VHI. All rights reserved.  Cat Back    Kneel on hands and knees. Tuck chin and tighten stomach. Exhale and round back upward. Inhale and arch back downward. Hold each position __1_ seconds. Repeat _15__ times per session. Do _1__ sessions per day.  Copyright  VHI. All rights reserved.  BACK: Child's Pose (Sciatica)    Sit in knee-chest position and reach arms forward. Separate knees for comfort. Hold position for _30__ breaths. Repeat _2__ times. Do __1_ times per day.  Copyright  VHI. All rights reserved.  Quick Contraction: Gravity Resisted (Sitting)    Sitting, quickly squeeze then fully relax pelvic floor. Perform __1_ sets of _5. Rest for _1__ seconds between sets. Do _3__ times a day.  Copyright  VHI. All rights reserved.  Slow Contraction:  Gravity Resisted (Sitting)    Sitting, slowly squeeze pelvic floor for _5__ seconds. Rest for __5_ seconds. Repeat _10__ times. Do _3__ times a day. Work toward 10 seconds Copyright  VHI. All rights reserved.  Moisturizers . They are used in the vagina to hydrate the mucous membrane that make up the vaginal canal. . Designed to keep a more normal acid balance (ph) . Once placed in the vagina, it will last between two to three days.  . Use 2-3 times per week at bedtime and last longer than 60 min. . Ingredients to avoid is glycerin and fragrance, can increase chance of infection . Should not be used just before sex due to causing irritation . Most are gels administered either in a tampon-shaped applicator or as a vaginal suppository. They are non-hormonal.   Types of Moisturizers . Samul Dada- drug store . Vitamin E vaginal suppositories- Whole foods, Amazon . Moist Again . Coconut oil- can break down condoms . Michail Jewels . Yes moisturizer- amazon . NeuEve Silk , NeuEve Silver for menopausal or over 65 (if have severe vaginal atrophy or cancer treatments use NeuEve Silk for  1 month than move to [employer])- [employer], MapleFlower.dk . Olive and Bee intimate cream- www.oliveandbee.com.au  Creams to use externally on the Vulva area  Albertson's (good for for cancer patients that had radiation to the area)- [place] or [employer].FlyingBasics.com.br  V-magic cream - amazon  Julva-amazon  Vital "V [place] salve ( help moisturize and help with thinning vulvar area, does  have [place]   Things to avoid in the vaginal area . Do not use things to irritate the vulvar area . No lotions just specialized creams for the vulva area- Neogyn, V-magic, No soaps; can use Aveeno or Calendula cleanser if needed. Must be gentle . No deodorants . No douches . Good to sleep without underwear to let the vaginal area to air  out . No scrubbing: spread the lips to let warm water rinse over labias and pat dry  Lubrication . Used for intercourse to reduce friction . Avoid ones that have glycerin, warming gels, tingling gels, icing or cooling gel, scented . Avoid parabens due to a preservative similar to female sex hormone . May need to be reapplied once or several times during sexual activity . Can be applied to both partners genitals prior to vaginal penetration to minimize friction or irritation . Prevent irritation and mucosal tears that cause post coital pain and increased the risk of vaginal and urinary tract infections . Oil-based lubricants cannot be used with condoms due to breaking them down.  Least likely to irritate vaginal tissue.  . Plant based-lubes are safe . Silicone-based lubrication are thicker and last long and used for post-menopausal women  Vaginal Lubricators Here is a list of some suggested lubricators you can use for intercourse. Use the most hypoallergenic product.  You can place on you or your partner.   Slippery Stuff  Sylk or Sliquid Natural H2O ( good  if frequent UTI's)  Blossom Organics (www.blossom-organics.com)  Luvena   Coconut oil  PJur Woman Nude- water based lubricant, amazon  Uberlube- Amazon  Aloe Vera  Yes lubricant- [employer] Platinum-Silicone, Target, [facility]reens  Olive and Bee intimate cream-  www.oliveandbee.com.au Things to avoid in lubricants are glycerin, warming gels, tingling gels, icing or cooling  gels, and scented gels.  Also avoid Vaseline. KY jelly, Replens, and Astroglide kills good bacteria(lactobacilli)  Things to avoid in the vaginal area . Do not use things to irritate the vulvar area . No lotions- see below . No soaps; can use Aveeno or Calendula cleanser if needed. Must be gentle . No deodorants . No douches . Good to sleep without underwear to let the vaginal area to air out . No scrubbing: spread the lips to let warm water rinse  over labias and pat dry  Creams that can be used on the [place] Releveum or [place] Harvest Gele    Massage the bottom of the vaginal opening by moving your thumb side to side to loosen up the tissue.  [facility] [address] [place], [place] [address] Phone # [phone] Fax [phone]

## 2017-05-10 DIAGNOSIS — Z79899 Other long term (current) drug therapy: Secondary | ICD-10-CM | POA: Diagnosis not present

## 2017-05-10 DIAGNOSIS — E876 Hypokalemia: Secondary | ICD-10-CM | POA: Insufficient documentation

## 2017-05-10 DIAGNOSIS — C50912 Malignant neoplasm of unspecified site of left female breast: Secondary | ICD-10-CM | POA: Diagnosis not present

## 2017-05-10 DIAGNOSIS — Z48288 Encounter for aftercare following multiple organ transplant: Secondary | ICD-10-CM | POA: Diagnosis not present

## 2017-05-10 DIAGNOSIS — T8611 Kidney transplant rejection: Secondary | ICD-10-CM | POA: Diagnosis not present

## 2017-05-10 DIAGNOSIS — I10 Essential (primary) hypertension: Secondary | ICD-10-CM | POA: Diagnosis not present

## 2017-05-10 DIAGNOSIS — D899 Disorder involving the immune mechanism, unspecified: Secondary | ICD-10-CM | POA: Diagnosis not present

## 2017-05-10 DIAGNOSIS — Z4822 Encounter for aftercare following kidney transplant: Secondary | ICD-10-CM | POA: Diagnosis not present

## 2017-05-10 DIAGNOSIS — Z9889 Other specified postprocedural states: Secondary | ICD-10-CM | POA: Diagnosis not present

## 2017-05-10 DIAGNOSIS — Z9221 Personal history of antineoplastic chemotherapy: Secondary | ICD-10-CM | POA: Diagnosis not present

## 2017-05-10 DIAGNOSIS — N051 Unspecified nephritic syndrome with focal and segmental glomerular lesions: Secondary | ICD-10-CM | POA: Diagnosis not present

## 2017-05-10 DIAGNOSIS — Z923 Personal history of irradiation: Secondary | ICD-10-CM | POA: Diagnosis not present

## 2017-05-10 DIAGNOSIS — Z940 Kidney transplant status: Secondary | ICD-10-CM | POA: Diagnosis not present

## 2017-05-10 DIAGNOSIS — E213 Hyperparathyroidism, unspecified: Secondary | ICD-10-CM | POA: Diagnosis not present

## 2017-05-10 DIAGNOSIS — Z5181 Encounter for therapeutic drug level monitoring: Secondary | ICD-10-CM | POA: Diagnosis not present

## 2017-06-07 DIAGNOSIS — E212 Other hyperparathyroidism: Secondary | ICD-10-CM | POA: Diagnosis not present

## 2017-06-07 DIAGNOSIS — Z9221 Personal history of antineoplastic chemotherapy: Secondary | ICD-10-CM | POA: Diagnosis not present

## 2017-06-07 DIAGNOSIS — E876 Hypokalemia: Secondary | ICD-10-CM | POA: Diagnosis not present

## 2017-06-07 DIAGNOSIS — Z5181 Encounter for therapeutic drug level monitoring: Secondary | ICD-10-CM | POA: Diagnosis not present

## 2017-06-07 DIAGNOSIS — I10 Essential (primary) hypertension: Secondary | ICD-10-CM | POA: Diagnosis not present

## 2017-06-07 DIAGNOSIS — D899 Disorder involving the immune mechanism, unspecified: Secondary | ICD-10-CM | POA: Diagnosis not present

## 2017-06-07 DIAGNOSIS — C50812 Malignant neoplasm of overlapping sites of left female breast: Secondary | ICD-10-CM | POA: Diagnosis not present

## 2017-06-07 DIAGNOSIS — Z940 Kidney transplant status: Secondary | ICD-10-CM | POA: Diagnosis not present

## 2017-06-07 DIAGNOSIS — Z79899 Other long term (current) drug therapy: Secondary | ICD-10-CM | POA: Diagnosis not present

## 2017-06-07 DIAGNOSIS — Z923 Personal history of irradiation: Secondary | ICD-10-CM | POA: Diagnosis not present

## 2017-06-07 DIAGNOSIS — C50912 Malignant neoplasm of unspecified site of left female breast: Secondary | ICD-10-CM | POA: Diagnosis not present

## 2017-06-07 DIAGNOSIS — Z4822 Encounter for aftercare following kidney transplant: Secondary | ICD-10-CM | POA: Diagnosis not present

## 2017-06-07 DIAGNOSIS — Z48288 Encounter for aftercare following multiple organ transplant: Secondary | ICD-10-CM | POA: Diagnosis not present

## 2017-06-07 DIAGNOSIS — Z9889 Other specified postprocedural states: Secondary | ICD-10-CM | POA: Diagnosis not present

## 2017-07-05 DIAGNOSIS — Z940 Kidney transplant status: Secondary | ICD-10-CM | POA: Diagnosis not present

## 2017-07-05 DIAGNOSIS — Z4822 Encounter for aftercare following kidney transplant: Secondary | ICD-10-CM | POA: Diagnosis not present

## 2017-08-02 DIAGNOSIS — Z940 Kidney transplant status: Secondary | ICD-10-CM | POA: Diagnosis not present

## 2017-08-02 DIAGNOSIS — Z4822 Encounter for aftercare following kidney transplant: Secondary | ICD-10-CM | POA: Diagnosis not present

## 2017-09-06 DIAGNOSIS — Z853 Personal history of malignant neoplasm of breast: Secondary | ICD-10-CM | POA: Diagnosis not present

## 2017-09-06 DIAGNOSIS — I10 Essential (primary) hypertension: Secondary | ICD-10-CM | POA: Diagnosis not present

## 2017-09-06 DIAGNOSIS — Z9221 Personal history of antineoplastic chemotherapy: Secondary | ICD-10-CM | POA: Diagnosis not present

## 2017-09-06 DIAGNOSIS — Z940 Kidney transplant status: Secondary | ICD-10-CM | POA: Diagnosis not present

## 2017-09-06 DIAGNOSIS — E891 Postprocedural hypoinsulinemia: Secondary | ICD-10-CM | POA: Diagnosis not present

## 2017-09-06 DIAGNOSIS — Z9010 Acquired absence of unspecified breast and nipple: Secondary | ICD-10-CM | POA: Diagnosis not present

## 2017-09-06 DIAGNOSIS — E876 Hypokalemia: Secondary | ICD-10-CM | POA: Diagnosis not present

## 2017-09-06 DIAGNOSIS — E139 Other specified diabetes mellitus without complications: Secondary | ICD-10-CM | POA: Diagnosis not present

## 2017-09-06 DIAGNOSIS — Z4822 Encounter for aftercare following kidney transplant: Secondary | ICD-10-CM | POA: Diagnosis not present

## 2017-09-20 DIAGNOSIS — Z940 Kidney transplant status: Secondary | ICD-10-CM | POA: Diagnosis not present

## 2017-09-20 DIAGNOSIS — Z4822 Encounter for aftercare following kidney transplant: Secondary | ICD-10-CM | POA: Diagnosis not present

## 2017-09-26 DIAGNOSIS — D2239 Melanocytic nevi of other parts of face: Secondary | ICD-10-CM | POA: Diagnosis not present

## 2017-09-26 DIAGNOSIS — D225 Melanocytic nevi of trunk: Secondary | ICD-10-CM | POA: Diagnosis not present

## 2017-09-26 DIAGNOSIS — L821 Other seborrheic keratosis: Secondary | ICD-10-CM | POA: Diagnosis not present

## 2017-09-28 ENCOUNTER — Encounter: Payer: Self-pay | Admitting: Gastroenterology

## 2017-10-06 NOTE — Progress Notes (Signed)
[place]  Telephone:([phone] Fax:([phone]) [phone]     ID: SABRIYAH WILCHER DOB: 07-02-1960  MR#: [id number]  VZC#:[id number]  Patient Care Team: [name], MD as PCP - General (Obstetrics and Gynecology) [name], MD as Consulting Physician (Nephrology) [name], MD as Consulting Physician (Obstetrics and Gynecology) [name], MD as Consulting Physician (General Surgery) Zelphia Glover, [name], MD as Consulting Physician (Oncology) [name], MD as Consulting Physician (Radiation Oncology) [name], MD as Referring Physician (Surgery) [name], DO (Internal Medicine) OTHER MD:  CHIEF COMPLAINT: Estrogen receptor positive breast cancer  CURRENT TREATMENT: anastrozole   INTERVAL HISTORY:  Erika Freeman returns today for follow-up and treatment of her estrogen receptor positive breast cancer, accompanied by her husband. She continues on anastrozole, with good tolerance. She has hot flashes that aren't intense. She also has some vaginal dryness, that she aids with Astroglide gel and Replens. She previously completed the pelvic health program here.      REVIEW OF SYSTEMS: Erika Freeman walks with her husband for exercise. She walks about 3-4 miles 3-4 times per week. She is concerned with weight gain. She notes that her potassium is low, and she has been eating potatoes. She denies unusual headaches, visual changes, nausea, vomiting, or dizziness. There has been no unusual cough, phlegm production, or pleurisy. There has been no change in bowel or bladder habits. She denies unexplained fatigue or unexplained weight loss, bleeding, rash, or fever. A detailed review of systems was otherwise stable.    BREAST CANCER HISTORY: From the original intake note:  "Erika Freeman" herself noted a mass in her left breast. She ignored it because she thought it was related to menstruation. However as it persisted through 1. She brought it to Dr. [name]  attention and on 05/10/2016 she underwent left diagnostic mammography with tomography [facility] The breast density was category C. In the left breast lower central area there was a new circumscribed mass. This was firm and fixed in the lower inner left breast at middle depth. Ultrasonography confirmed a 2.6 cm hypoechoic irregular mass at the 6:30 o'clock radiant 3 cm from the nipple. There was also a 0.5 cm nodule 1.4 cm medial to the mass just described.  Biopsy of the left breast mass in question 05/10/2016 showed([id number]) invasive ductal carcinoma, grade 3, estrogen receptor 70% positive with weak staining intensity, progesterone receptor negative, with no HER-2 amplification, the signals ratio being 1.52 and the number per cell 2.35. The proliferation marker was 90%.  Her subsequent history is as detailed below  OF NOTE: The patient has a history of focal segmental glomerular sclerosis with end-stage renal disease and is status post unrelated donor renal transplant 04/15/2013 as part of appeared donor exchange with her husband donating one kidney, the other harvested from a 56 year old [place] man. She continues on immunosuppression with mycophenolate and tacrolimus.  PAST MEDICAL HISTORY: Past Medical History:  Diagnosis Date  . Anemia   . Breast cancer ([place])   . Cancer [facility])    breast cancer  . Chills   . Chronic kidney disease    resolved kidney transplant - [facility] - transplant -2015  . Cough   . Fibroid tumor   . GERD (gastroesophageal reflux disease)    resolved   . Heart murmur   . History of radiation therapy 12/20/16-01/17/17   left breast 50.4 gy in 28 fractions  . Hypertension    DX  30 YRS  AGO  . Personal history of chemotherapy   . Umbilical hernia s/p primary repair 01/10/2012 01/24/2012  . Vomiting     PAST SURGICAL HISTORY: Past Surgical History:  Procedure Laterality Date  . BREAST BIOPSY    . BREAST LUMPECTOMY  WITH RADIOACTIVE SEED AND SENTINEL LYMPH NODE BIOPSY Left 11/14/2016   Procedure: LEFT BREAST RADIOACTIVE SEED BRACKETED LUMPECTOMY, LEFT AXILLARY SENTINEL LYMPH NODE BIOPSY WITH BLUE DYE INJECTION;  Surgeon: [name], MD;  Location: [facility];  Service: General;  Laterality: Left;  . BREAST SURGERY  2012   left - fibroadenoma  . CAPD INSERTION  01/10/2012   Procedure: LAPAROSCOPIC INSERTION CONTINUOUS AMBULATORY PERITONEAL DIALYSIS  (CAPD) CATHETER;  Surgeon: Adin Hector, MD;  Location: [place];  Service: General;  Laterality: N/A;  LAPAROSCOPIC CAPD CATHETER PLACEMENT OMENTOPEXY  . CESAREAN SECTION  1996  . DILATION AND CURETTAGE OF UTERUS    . KIDNEY TRANSPLANT    . PARATHYROIDECTOMY  2000 - approximate  . PORT-A-CATH REMOVAL Right 11/14/2016   Procedure: REMOVAL PORT-A-CATH;  Surgeon: [name], MD;  Location: [facility];  Service: General;  Laterality: Right;  . PORTACATH PLACEMENT Right 05/24/2016   Procedure: INSERTION PORT-A-CATH WITH [place];  Surgeon: [name], MD;  Location: [facility];  Service: General;  Laterality: Right;  . RENAL BIOPSY  2011  . TUBAL LIGATION    . UMBILICAL HERNIA REPAIR  01/10/2012   Procedure: HERNIA REPAIR UMBILICAL ADULT;  Surgeon: Adin Hector, MD;  Location: [facility];  Service: General;  Laterality: N/A;    FAMILY HISTORY Family History  Problem Relation Age of Onset  . Cancer Maternal Uncle        lung  . Cancer Cousin        breast  . Cancer Cousin        colon  The patient has very little information regarding her father. Her mother is living, 10 years old as of March 2018. The patient had one full brother, diagnosed with prostate cancer in his 56s. The patient has 2 half-brothers and 1 half-sister. There is no history of breast or ovarian cancer in the family to the patient's knowledge   GYNECOLOGIC HISTORY:  Patient's last menstrual period was 04/27/2016 (exact date).  Menarche age 69, first live birth age 75, the patient is GX P3.  Her periods are regular, the last 5 or 6 days, with no heavy days. 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 Her husband white works at home [employer]. Son [name] lives in [place] and as Freight forwarder of a Therapist, art. Son [name] lives in [place] and is a Air traffic controller. Son [name] lives in [place] and is studying based trombone. The patient has 2 grandchildren. She attends a local [employer]:  not in place    HEALTH MAINTENANCE: Social History   Tobacco Use  . Smoking status: Never Smoker  . Smokeless tobacco: Never Used  Substance Use Topics  . Alcohol use: No  . Drug use: No     Colonoscopy:  PAP:  Bone density:   No Known Allergies  Current Outpatient Medications  Medication Sig Dispense Refill  . acetaminophen (TYLENOL) 500 MG tablet Take 1,000 mg by mouth every 6 (six) hours as needed.    [name] amLODipine (NORVASC) 10 MG tablet Take 10 mg by mouth daily.    [name] anastrozole (  ARIMIDEX) 1 MG tablet Take 1 tablet (1 mg total) by mouth daily. 90 tablet 4  . aspirin 81 MG tablet Take 81 mg by mouth every morning.     . cholecalciferol (VITAMIN D) 1000 units tablet Take 1 tablet (1,000 Units total) by mouth daily. 90 tablet 4  . diphenhydrAMINE (BENADRYL) 25 MG tablet Take 25 mg by mouth daily as needed for allergies.    [name] lovastatin (MEVACOR) 20 MG tablet Take 20 mg by mouth every Monday, Wednesday, and Friday. Takes at bedtime.    . potassium chloride (K-DUR,KLOR-CON) 10 MEQ tablet Take 1 tablet by mouth daily.    . predniSONE (DELTASONE) 5 MG tablet Take 5 mg by mouth every morning.     . SENSIPAR 60 MG tablet Take 1 tablet by mouth daily. Monday-friday  5  . tacrolimus (PROGRAF) 1 MG capsule Take 3 mg by mouth 2 (two) times daily. Takes 64m in the AM and 23min the PM.     No current facility-administered medications  for this visit.     OBJECTIVE: Middle-aged African-American woman who appears well  Vitals:   10/09/17 0857  BP: (!) 126/97  Pulse: 81  Resp: 18  Temp: 98.4 F (36.9 C)  SpO2: 100%     Body mass index is 29.49 kg/m.   ECOG FS:0 - Asymptomatic   Sclerae unicteric, pupils round and equal No cervical or supraclavicular adenopathy Lungs no rales or rhonchi Heart regular rate and rhythm Abd soft, nontender, positive bowel sounds MSK no focal spinal tenderness, no upper extremity lymphedema Neuro: nonfocal, well oriented, appropriate affect Breasts: The right breast is benign.  The left breast has undergone lumpectomy.  There is no evidence of disease recurrence.  Both axillae are benign.  LAB RESULTS:  CMP     Component Value Date/Time   NA 144 10/09/2017 0816   NA 142 02/14/2017 1012   K 3.3 (L) 10/09/2017 0816   K 2.8 (LL) 02/14/2017 1012   CL 105 10/09/2017 0816   CO2 25 10/09/2017 0816   CO2 30 (H) 02/14/2017 1012   GLUCOSE 155 (H) 10/09/2017 0816   GLUCOSE 104 02/14/2017 1012   BUN 13 10/09/2017 0816   BUN 14.1 02/14/2017 1012   CREATININE 1.28 (H) 10/09/2017 0816   CREATININE 1.2 (H) 02/14/2017 1012   CALCIUM 10.0 10/09/2017 0816   CALCIUM 9.6 02/14/2017 1012   PROT 7.9 10/09/2017 0816   PROT 7.8 02/14/2017 1012   ALBUMIN 4.4 10/09/2017 0816   ALBUMIN 4.4 02/14/2017 1012   AST 18 10/09/2017 0816   AST 15 02/14/2017 1012   ALT 25 10/09/2017 0816   ALT 15 02/14/2017 1012   ALKPHOS 77 10/09/2017 0816   ALKPHOS 72 02/14/2017 1012   BILITOT 0.5 10/09/2017 0816   BILITOT 0.53 02/14/2017 1012   GFRNONAA 46 (L) 10/09/2017 0816   GFRAA 53 (L) 10/09/2017 0816    No results found for: TOTALPROTELP, ALBUMINELP, A1GS, A2GS, BETS, BETA2SER, GAMS, MSPIKE, SPEI  No results found for: KPAFRELGTCHN, LAMBDASER, KAPLAMBRATIO  Lab Results  Component Value Date   WBC 9.0 10/09/2017   NEUTROABS 5.9 10/09/2017   HGB 12.6 10/09/2017   HCT 38.9 10/09/2017   MCV 86.3  10/09/2017   PLT 195 10/09/2017      Chemistry      Component Value Date/Time   NA 144 10/09/2017 0816   NA 142 02/14/2017 1012   K 3.3 (L) 10/09/2017 0816   K 2.8 (LL) 02/14/2017 1012   CL  105 10/09/2017 0816   CO2 25 10/09/2017 0816   CO2 30 (H) 02/14/2017 1012   BUN 13 10/09/2017 0816   BUN 14.1 02/14/2017 1012   CREATININE 1.28 (H) 10/09/2017 0816   CREATININE 1.2 (H) 02/14/2017 1012      Component Value Date/Time   CALCIUM 10.0 10/09/2017 0816   CALCIUM 9.6 02/14/2017 1012   ALKPHOS 77 10/09/2017 0816   ALKPHOS 72 02/14/2017 1012   AST 18 10/09/2017 0816   AST 15 02/14/2017 1012   ALT 25 10/09/2017 0816   ALT 15 02/14/2017 1012   BILITOT 0.5 10/09/2017 0816   BILITOT 0.53 02/14/2017 1012       No results found for: LABCA2  No components found for: ZRAQTM226  No results for input(s): INR in the last 168 hours.  Urinalysis    Component Value Date/Time   COLORURINE YELLOW 11/24/2006 [place] 11/24/2006 1539   LABSPEC 1.018 11/24/2006 1539   PHURINE 6.0 11/24/2006 1539   GLUCOSEU NEGATIVE 11/24/2006 1539   HGBUR SMALL (A) 11/24/2006 1539   BILIRUBINUR NEGATIVE 11/24/2006 [place] 11/24/2006 1539   PROTEINUR 100 (A) 11/24/2006 1539   UROBILINOGEN 1.0 11/24/2006 1539   NITRITE NEGATIVE 11/24/2006 1539   LEUKOCYTESUR MODERATE (A) 11/24/2006 1539     STUDIES: She is scheduled for mammography on 10/17/2017.  She is also due for a bone density scan  ELIGIBLE FOR AVAILABLE RESEARCH PROTOCOL: no  ASSESSMENT: 58 y.o. [place], [place] woman status post left breast lower inner quadrant biopsy 05/10/2016 for a clinical  T2 N0, prognostic stage IIB invaside ductal carcinoma, grade 3, weekly estrogen receptor positive, progesterone receptor negative, with no HER-2 amplification, and the MIB-1 at 90%  (a) biopsy of the 0.6 mm satellite at the 6:30 o'clock radiant in the left breast 06/02/2016 showed invasive ductal carcinoma, grade 3,  estrogen receptor 20% positive, with weak staining intensity, progesterone receptor negative with no HER-2 amplification, and an MIB-1 of 70% (identical to larger mass)   (1) neoadjuvant chemotherapy consisting of doxorubicin and cyclophosphamide in dose dense fashion 4 started 06/01/2016, completed 07/12/2016  followed by Paclitaxel weekly 12 started 07/26/2016, completed 10/11/2016  (2) definitive surgery 11/14/2016 found a residual pT1a pN0 area of invasive ductal carcinoma (0.2 mm), with negative margins.  (3) adjuvant radiation 12/20/16-01/17/17 Site/dose:   Left Breast, 50.4 Gy total delivered in 28 fractions   (4) started anastrozole 02/28/2017  (a) left heel DEXA scan 11/24/2004 was normal  (b) renal feels any additional radiation may be detrimental so bone density not updated  PLAN:  Erika Freeman is now just about a year out from definitive surgery for her breast cancer with no evidence of disease recurrence.  This is very favorable.  She is concerned about her blood sugars and increasing weight for potassium she is going to try some fruit daily..   We discussed a low or no carb diet.  She is also worried about her low potassium.  She is going to try some fruit daily to correct that  We have been advised not to obtain bone densities because of radiation concerns.  She is on vitamin D supplementation and has an excellent walking program  She is already scheduled for mammography next week.  I am delighted that she is doing so well overall.  She will return to see me in a year.  She knows to call for any other issues that may develop before then.   Sedale Jenifer, [name], MD  10/09/17 9:32 AM Medical Oncology and Hematology [facility] [address] [place], [place] [address] Tel. [phone]    Fax. [phone]  Alice Rieger, am acting as scribe for [name] MD.  I, Lurline Del MD, have reviewed the above documentation for accuracy and  completeness, and I agree with the above.

## 2017-10-09 ENCOUNTER — Inpatient Hospital Stay: Payer: Medicare Other | Attending: Oncology | Admitting: Oncology

## 2017-10-09 ENCOUNTER — Inpatient Hospital Stay: Payer: Medicare Other

## 2017-10-09 VITALS — BP 126/97 | HR 81 | Temp 98.4°F | Resp 18 | Ht 65.0 in | Wt 177.2 lb

## 2017-10-09 DIAGNOSIS — Z170 Estrogen receptor positive status [ER+]: Secondary | ICD-10-CM | POA: Diagnosis not present

## 2017-10-09 DIAGNOSIS — Z940 Kidney transplant status: Secondary | ICD-10-CM | POA: Diagnosis not present

## 2017-10-09 DIAGNOSIS — R635 Abnormal weight gain: Secondary | ICD-10-CM | POA: Insufficient documentation

## 2017-10-09 DIAGNOSIS — C50312 Malignant neoplasm of lower-inner quadrant of left female breast: Secondary | ICD-10-CM | POA: Insufficient documentation

## 2017-10-09 DIAGNOSIS — Z79811 Long term (current) use of aromatase inhibitors: Secondary | ICD-10-CM | POA: Insufficient documentation

## 2017-10-09 DIAGNOSIS — E876 Hypokalemia: Secondary | ICD-10-CM | POA: Diagnosis not present

## 2017-10-09 DIAGNOSIS — Z779 Other contact with and (suspected) exposures hazardous to health: Secondary | ICD-10-CM | POA: Diagnosis not present

## 2017-10-09 DIAGNOSIS — Z6830 Body mass index (BMI) 30.0-30.9, adult: Secondary | ICD-10-CM | POA: Diagnosis not present

## 2017-10-09 LAB — CBC WITH DIFFERENTIAL/PLATELET
BASOS PCT: 0 %
Basophils Absolute: 0 10*3/uL (ref 0.0–0.1)
EOS PCT: 2 %
Eosinophils Absolute: 0.2 10*3/uL (ref 0.0–0.5)
HCT: 38.9 % (ref 34.8–46.6)
HEMOGLOBIN: 12.6 g/dL (ref 11.6–15.9)
LYMPHS ABS: 2.3 10*3/uL (ref 0.9–3.3)
Lymphocytes Relative: 25 %
MCH: 27.9 pg (ref 25.1–34.0)
MCHC: 32.4 g/dL (ref 31.5–36.0)
MCV: 86.3 fL (ref 79.5–101.0)
Monocytes Absolute: 0.6 10*3/uL (ref 0.1–0.9)
Monocytes Relative: 7 %
NEUTROS PCT: 66 %
Neutro Abs: 5.9 10*3/uL (ref 1.5–6.5)
PLATELETS: 195 10*3/uL (ref 145–400)
RBC: 4.51 MIL/uL (ref 3.70–5.45)
RDW: 13.8 % (ref 11.2–14.5)
WBC: 9 10*3/uL (ref 3.9–10.3)

## 2017-10-09 LAB — COMPREHENSIVE METABOLIC PANEL
ALBUMIN: 4.4 g/dL (ref 3.5–5.0)
ALK PHOS: 77 U/L (ref 38–126)
ALT: 25 U/L (ref 0–44)
ANION GAP: 14 (ref 5–15)
AST: 18 U/L (ref 15–41)
BUN: 13 mg/dL (ref 6–20)
CHLORIDE: 105 mmol/L (ref 98–111)
CO2: 25 mmol/L (ref 22–32)
Calcium: 10 mg/dL (ref 8.9–10.3)
Creatinine, Ser: 1.28 mg/dL — ABNORMAL HIGH (ref 0.44–1.00)
GFR calc non Af Amer: 46 mL/min — ABNORMAL LOW (ref >60)
GFR, EST AFRICAN AMERICAN: 53 mL/min — AB (ref >60)
GLUCOSE: 155 mg/dL — AB (ref 70–99)
Potassium: 3.3 mmol/L — ABNORMAL LOW (ref 3.5–5.1)
SODIUM: 144 mmol/L (ref 135–145)
Total Bilirubin: 0.5 mg/dL (ref 0.3–1.2)
Total Protein: 7.9 g/dL (ref 6.5–8.1)

## 2017-10-11 DIAGNOSIS — Z4822 Encounter for aftercare following kidney transplant: Secondary | ICD-10-CM | POA: Diagnosis not present

## 2017-10-11 DIAGNOSIS — Z940 Kidney transplant status: Secondary | ICD-10-CM | POA: Diagnosis not present

## 2017-10-11 DIAGNOSIS — Z79899 Other long term (current) drug therapy: Secondary | ICD-10-CM | POA: Diagnosis not present

## 2017-10-17 ENCOUNTER — Telehealth: Payer: Self-pay

## 2017-10-17 ENCOUNTER — Ambulatory Visit
Admission: RE | Admit: 2017-10-17 | Discharge: 2017-10-17 | Disposition: A | Discharge status: Home / Self Care | Payer: Medicare Other | Source: Ambulatory Visit | Attending: Oncology | Admitting: Oncology

## 2017-10-17 DIAGNOSIS — Z170 Estrogen receptor positive status [ER+]: Principal | ICD-10-CM

## 2017-10-17 DIAGNOSIS — N185 Chronic kidney disease, stage 5: Secondary | ICD-10-CM

## 2017-10-17 DIAGNOSIS — Z853 Personal history of malignant neoplasm of breast: Secondary | ICD-10-CM | POA: Diagnosis not present

## 2017-10-17 DIAGNOSIS — C50312 Malignant neoplasm of lower-inner quadrant of left female breast: Secondary | ICD-10-CM

## 2017-10-17 DIAGNOSIS — R922 Inconclusive mammogram: Secondary | ICD-10-CM | POA: Diagnosis not present

## 2017-10-17 HISTORY — DX: Personal history of irradiation: Z92.3

## 2017-10-17 NOTE — Telephone Encounter (Signed)
Rescheduled patient appointment. Per 8/12 los to return in one year

## 2017-10-31 ENCOUNTER — Ambulatory Visit: Payer: Medicare Other | Admitting: Oncology

## 2017-10-31 ENCOUNTER — Other Ambulatory Visit: Payer: Medicare Other

## 2017-11-03 ENCOUNTER — Encounter: Payer: Self-pay | Admitting: Gastroenterology

## 2017-11-07 DIAGNOSIS — R739 Hyperglycemia, unspecified: Secondary | ICD-10-CM | POA: Diagnosis not present

## 2017-11-07 DIAGNOSIS — Z940 Kidney transplant status: Secondary | ICD-10-CM | POA: Diagnosis not present

## 2017-11-13 ENCOUNTER — Ambulatory Visit (AMBULATORY_SURGERY_CENTER): Payer: Self-pay

## 2017-11-13 VITALS — Ht 65.0 in | Wt 172.4 lb

## 2017-11-13 DIAGNOSIS — Z1211 Encounter for screening for malignant neoplasm of colon: Secondary | ICD-10-CM

## 2017-11-13 NOTE — Progress Notes (Signed)
No egg or soy allergy known to patient  No issues with past sedation with any surgeries  or procedures, no intubation problems  No diet pills per patient No home 02 use per patient  No blood thinners per patient  Pt denies issues with constipation  No A fib or A flutter  EMMI video sent to pt's e mail  Pt. declined 

## 2017-12-06 DIAGNOSIS — I120 Hypertensive chronic kidney disease with stage 5 chronic kidney disease or end stage renal disease: Secondary | ICD-10-CM | POA: Diagnosis not present

## 2017-12-06 DIAGNOSIS — Z923 Personal history of irradiation: Secondary | ICD-10-CM | POA: Diagnosis not present

## 2017-12-06 DIAGNOSIS — Z940 Kidney transplant status: Secondary | ICD-10-CM | POA: Diagnosis not present

## 2017-12-06 DIAGNOSIS — Z7952 Long term (current) use of systemic steroids: Secondary | ICD-10-CM | POA: Diagnosis not present

## 2017-12-06 DIAGNOSIS — E118 Type 2 diabetes mellitus with unspecified complications: Secondary | ICD-10-CM | POA: Diagnosis not present

## 2017-12-06 DIAGNOSIS — E876 Hypokalemia: Secondary | ICD-10-CM | POA: Diagnosis not present

## 2017-12-06 DIAGNOSIS — Z853 Personal history of malignant neoplasm of breast: Secondary | ICD-10-CM | POA: Diagnosis not present

## 2017-12-06 DIAGNOSIS — C50919 Malignant neoplasm of unspecified site of unspecified female breast: Secondary | ICD-10-CM | POA: Diagnosis not present

## 2017-12-06 DIAGNOSIS — E138 Other specified diabetes mellitus with unspecified complications: Secondary | ICD-10-CM | POA: Diagnosis not present

## 2017-12-06 DIAGNOSIS — Z4822 Encounter for aftercare following kidney transplant: Secondary | ICD-10-CM | POA: Diagnosis not present

## 2017-12-06 DIAGNOSIS — Z79811 Long term (current) use of aromatase inhibitors: Secondary | ICD-10-CM | POA: Diagnosis not present

## 2017-12-06 DIAGNOSIS — E212 Other hyperparathyroidism: Secondary | ICD-10-CM | POA: Diagnosis not present

## 2017-12-06 DIAGNOSIS — I10 Essential (primary) hypertension: Secondary | ICD-10-CM | POA: Diagnosis not present

## 2017-12-06 DIAGNOSIS — Z9221 Personal history of antineoplastic chemotherapy: Secondary | ICD-10-CM | POA: Diagnosis not present

## 2017-12-06 DIAGNOSIS — D8989 Other specified disorders involving the immune mechanism, not elsewhere classified: Secondary | ICD-10-CM | POA: Diagnosis not present

## 2017-12-06 DIAGNOSIS — Z23 Encounter for immunization: Secondary | ICD-10-CM | POA: Diagnosis not present

## 2017-12-06 DIAGNOSIS — Z79899 Other long term (current) drug therapy: Secondary | ICD-10-CM | POA: Diagnosis not present

## 2017-12-20 ENCOUNTER — Encounter: Payer: Self-pay | Admitting: Gastroenterology

## 2017-12-20 ENCOUNTER — Ambulatory Visit (AMBULATORY_SURGERY_CENTER): Payer: Medicare Other | Admitting: Gastroenterology

## 2017-12-20 VITALS — BP 109/69 | HR 67 | Temp 97.1°F | Resp 15 | Ht 65.0 in | Wt 172.0 lb

## 2017-12-20 DIAGNOSIS — Z1211 Encounter for screening for malignant neoplasm of colon: Secondary | ICD-10-CM | POA: Diagnosis not present

## 2017-12-20 MED ORDER — SODIUM CHLORIDE 0.9 % IV SOLN: 500.0000 mL | Freq: Once | INTRAVENOUS | Status: DC

## 2017-12-20 NOTE — Progress Notes (Signed)
PT taken to PACU. Monitors in place. VSS. Report given to RN. 

## 2017-12-20 NOTE — Op Note (Signed)
[place] Patient Name: Erika Freeman Procedure Date: 12/20/2017 8:25 AM MRN: [id number] Endoscopist: Jackquline Denmark , MD Age: 57 Referring MD:  Date of Birth: 05-09-1960 Gender: Female Account #: [id number] Procedure:                Colonoscopy Indications:              Screening for colorectal malignant neoplasm Medicines:                Monitored Anesthesia Care Procedure:                Pre-Anesthesia Assessment:                           - Prior to the procedure, a History and Physical                            was performed, and patient medications and                            allergies were reviewed. The patient's tolerance of                            previous anesthesia was also reviewed. The risks                            and benefits of the procedure and the sedation                            options and risks were discussed with the patient.                            All questions were answered, and informed consent                            was obtained. Prior Anticoagulants: The patient has                            taken no previous anticoagulant or antiplatelet                            agents. ASA Grade Assessment: III - A patient with                            severe systemic disease. After reviewing the risks                            and benefits, the patient was deemed in                            satisfactory condition to undergo the procedure.                           After obtaining informed consent, the colonoscope  was passed under direct vision. Throughout the                            procedure, the patient's blood pressure, pulse, and                            oxygen saturations were monitored continuously. The                            Model CF-HQ190L [id number]) scope was introduced                            through the anus and advanced to the the cecum,                            identified by  appendiceal orifice and ileocecal                            valve. The colonoscopy was performed without                            difficulty. The patient tolerated the procedure                            well. The quality of the bowel preparation was good. Scope In: 8:34:50 AM Scope Out: 9:67:89 AM Scope Withdrawal Time: 0 hours 5 minutes 22 seconds  Total Procedure Duration: 0 hours 11 minutes 19 seconds  Findings:                 Multiple small-mouthed diverticula were found in                            the entire colon. Most prominently in the sigmoid                            colon and in the ascending colon. No endoscopic                            evidence of diverticulitis. Few diverticula were                            also visualized in the cecum.                           Non-bleeding internal hemorrhoids were found during                            retroflexion. The hemorrhoids were small.                           The exam was otherwise without abnormality. Complications:            No immediate complications. Estimated Blood Loss:     Estimated blood loss: none. Impression:               -  Moderate pancolonic diverticulosis                           -Small internal hemorrhoids.                           -Otherwise normal colonoscopy. Recommendation:           - Patient has a contact number available for                            emergencies. The signs and symptoms of potential                            delayed complications were discussed with the                            patient. Return to normal activities tomorrow.                            Written discharge instructions were provided to the                            patient.                           - High fiber diet. Continue laxatives for now.                           - Patient has a contact number available for                            emergencies. The signs and symptoms of potential                             delayed complications were discussed with the                            patient. Return to normal activities tomorrow.                            Written discharge instructions were provided to the                            patient.                           - Continue present medications.                           - Repeat colonoscopy in 10 years for screening                            purposes. Earlier, if with any new problems.                           - Return to  GI clinic PRN. Jackquline Denmark, MD 12/20/2017 8:51:21 AM This report has been signed electronically.

## 2017-12-20 NOTE — Patient Instructions (Signed)
Handouts given on hemorrhoids, diverticulosis and high fiber diet  YOU HAD AN ENDOSCOPIC PROCEDURE TODAY AT [place] [facility]:   Refer to the procedure report that was given to you for any specific questions about what was found during the examination.  If the procedure report does not answer your questions, please call your gastroenterologist to clarify.  If you requested that your care partner not be given the details of your procedure findings, then the procedure report has been included in a sealed envelope for you to review at your convenience later.  YOU SHOULD EXPECT: Some feelings of bloating in the abdomen. Passage of more gas than usual.  Walking can help get rid of the air that was put into your GI tract during the procedure and reduce the bloating. If you had a lower endoscopy (such as a colonoscopy or flexible sigmoidoscopy) you may notice spotting of blood in your stool or on the toilet paper. If you underwent a bowel prep for your procedure, you may not have a normal bowel movement for a few days.  Please Note:  You might notice some irritation and congestion in your nose or some drainage.  This is from the oxygen used during your procedure.  There is no need for concern and it should clear up in a day or so.  SYMPTOMS TO REPORT IMMEDIATELY:   Following lower endoscopy (colonoscopy or flexible sigmoidoscopy):  Excessive amounts of blood in the stool  Significant tenderness or worsening of abdominal pains  Swelling of the abdomen that is new, acute  Fever of 100F or higher   For urgent or emergent issues, a gastroenterologist can be reached at any hour by calling [phone].   DIET:  We do recommend a small meal at first, but then you may proceed to your regular diet.  Drink plenty of fluids but you should avoid alcoholic beverages for 24 hours.  ACTIVITY:  You should plan to take it easy for the rest of today and you should NOT DRIVE or use heavy machinery until  tomorrow (because of the sedation medicines used during the test).    FOLLOW UP: Our staff will call the number listed on your records the next business day following your procedure to check on you and address any questions or concerns that you may have regarding the information given to you following your procedure. If we do not reach you, we will leave a message.  However, if you are feeling well and you are not experiencing any problems, there is no need to return our call.  We will assume that you have returned to your regular daily activities without incident.  If any biopsies were taken you will be contacted by phone or by letter within the next 1-3 weeks.  Please call us at [phone] if you have not heard about the biopsies in 3 weeks.    SIGNATURES/CONFIDENTIALITY: You and/or your care partner have signed paperwork which will be entered into your electronic medical record.  These signatures attest to the fact that that the information above on your After Visit Summary has been reviewed and is understood.  Full responsibility of the confidentiality of this discharge information lies with you and/or your care-partner.

## 2017-12-21 ENCOUNTER — Telehealth: Payer: Self-pay

## 2017-12-21 NOTE — Telephone Encounter (Signed)
Called [phone] and left a messaged we tried to reach pt for a follow up call. maw

## 2017-12-21 NOTE — Telephone Encounter (Signed)
  Follow up Call-  Call back number 12/20/2017  Post procedure Call Back phone  # [phone]  Permission to leave phone message Yes  Some recent data might be hidden     Patient questions:  Do you have a fever, pain , or abdominal swelling? No. Pain Score  0 *  Have you tolerated food without any problems? Yes.    Have you been able to return to your normal activities? Yes.    Do you have any questions about your discharge instructions: Diet   No. Medications  No. Follow up visit  No.  Do you have questions or concerns about your Care? No.  Actions: * If pain score is 4 or above: No action needed, pain <4.

## 2018-01-04 DIAGNOSIS — N390 Urinary tract infection, site not specified: Secondary | ICD-10-CM | POA: Diagnosis not present

## 2018-01-19 DIAGNOSIS — Z79899 Other long term (current) drug therapy: Secondary | ICD-10-CM | POA: Diagnosis not present

## 2018-02-07 DIAGNOSIS — L723 Sebaceous cyst: Secondary | ICD-10-CM | POA: Diagnosis not present

## 2018-02-07 DIAGNOSIS — C50512 Malignant neoplasm of lower-outer quadrant of left female breast: Secondary | ICD-10-CM | POA: Diagnosis not present

## 2018-03-06 ENCOUNTER — Other Ambulatory Visit: Payer: Self-pay | Admitting: General Surgery

## 2018-03-06 DIAGNOSIS — L720 Epidermal cyst: Secondary | ICD-10-CM | POA: Diagnosis not present

## 2018-03-06 DIAGNOSIS — L723 Sebaceous cyst: Secondary | ICD-10-CM | POA: Diagnosis not present

## 2018-03-12 DIAGNOSIS — Z79899 Other long term (current) drug therapy: Secondary | ICD-10-CM | POA: Diagnosis not present

## 2018-03-12 DIAGNOSIS — Z940 Kidney transplant status: Secondary | ICD-10-CM | POA: Diagnosis not present

## 2018-03-12 DIAGNOSIS — Z131 Encounter for screening for diabetes mellitus: Secondary | ICD-10-CM | POA: Diagnosis not present

## 2018-03-12 DIAGNOSIS — K219 Gastro-esophageal reflux disease without esophagitis: Secondary | ICD-10-CM | POA: Diagnosis not present

## 2018-03-12 DIAGNOSIS — Z4822 Encounter for aftercare following kidney transplant: Secondary | ICD-10-CM | POA: Diagnosis not present

## 2018-03-12 DIAGNOSIS — C50919 Malignant neoplasm of unspecified site of unspecified female breast: Secondary | ICD-10-CM | POA: Diagnosis not present

## 2018-03-12 DIAGNOSIS — Z853 Personal history of malignant neoplasm of breast: Secondary | ICD-10-CM | POA: Diagnosis not present

## 2018-03-12 DIAGNOSIS — Z7952 Long term (current) use of systemic steroids: Secondary | ICD-10-CM | POA: Diagnosis not present

## 2018-03-12 DIAGNOSIS — Z923 Personal history of irradiation: Secondary | ICD-10-CM | POA: Diagnosis not present

## 2018-03-12 DIAGNOSIS — I10 Essential (primary) hypertension: Secondary | ICD-10-CM | POA: Diagnosis not present

## 2018-03-12 DIAGNOSIS — D8989 Other specified disorders involving the immune mechanism, not elsewhere classified: Secondary | ICD-10-CM | POA: Diagnosis not present

## 2018-03-12 DIAGNOSIS — E876 Hypokalemia: Secondary | ICD-10-CM | POA: Diagnosis not present

## 2018-03-12 DIAGNOSIS — E212 Other hyperparathyroidism: Secondary | ICD-10-CM | POA: Diagnosis not present

## 2018-03-12 DIAGNOSIS — Z9221 Personal history of antineoplastic chemotherapy: Secondary | ICD-10-CM | POA: Diagnosis not present

## 2018-03-12 DIAGNOSIS — I120 Hypertensive chronic kidney disease with stage 5 chronic kidney disease or end stage renal disease: Secondary | ICD-10-CM | POA: Diagnosis not present

## 2018-04-09 DIAGNOSIS — Z79899 Other long term (current) drug therapy: Secondary | ICD-10-CM | POA: Diagnosis not present

## 2018-04-17 DIAGNOSIS — L239 Allergic contact dermatitis, unspecified cause: Secondary | ICD-10-CM | POA: Diagnosis not present

## 2018-04-17 DIAGNOSIS — L299 Pruritus, unspecified: Secondary | ICD-10-CM | POA: Diagnosis not present

## 2018-04-24 DIAGNOSIS — L300 Nummular dermatitis: Secondary | ICD-10-CM | POA: Diagnosis not present

## 2018-05-07 DIAGNOSIS — Z79899 Other long term (current) drug therapy: Secondary | ICD-10-CM | POA: Diagnosis not present

## 2018-05-11 ENCOUNTER — Other Ambulatory Visit: Payer: Self-pay | Admitting: *Deleted

## 2018-05-11 MED ORDER — ANASTROZOLE 1 MG PO TABS: 1.0000 mg | ORAL_TABLET | Freq: Every day | ORAL | 4 refills | Status: DC

## 2018-06-04 DIAGNOSIS — Z79899 Other long term (current) drug therapy: Secondary | ICD-10-CM | POA: Diagnosis not present

## 2018-06-04 IMAGING — MG NEEDLE LOCALIZATION OF THE LEFT BREAST WITH MAMMO GUIDANCE
8 of 15 series · 8 of 15 positions shown · non-contrast
Comparison: Previous exam(s).

CLINICAL DATA: 56-year-old female for radioactive seed localization
of the 2 left breast biopsy sites demonstrating invasive ductal
carcinoma.

EXAM:
MAMMOGRAPHIC GUIDED RADIOACTIVE SEED LOCALIZATION OF THE LEFT BREAST

[L CC (1 of 8)]
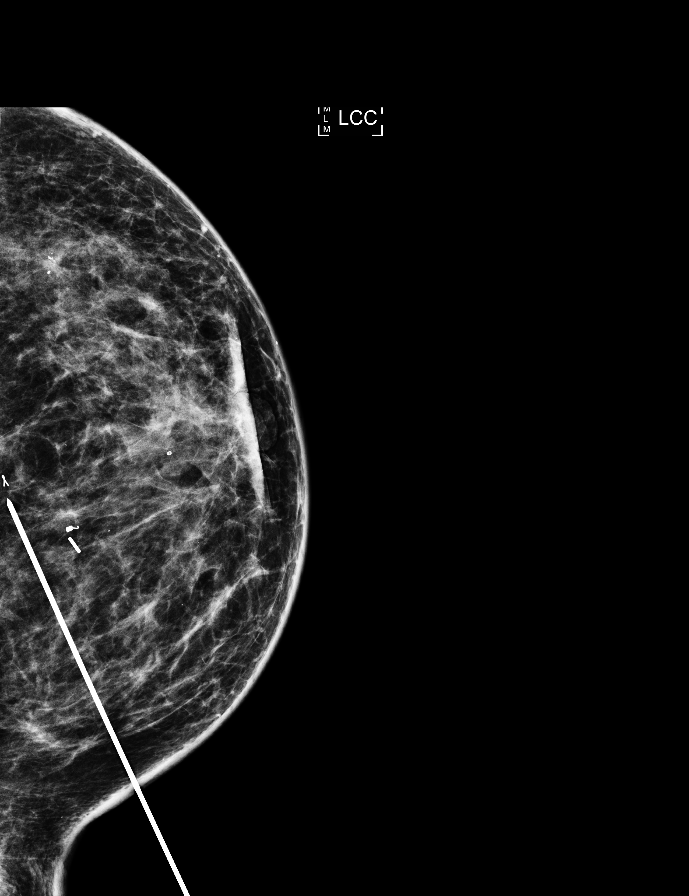

[L CC (2 of 8)]
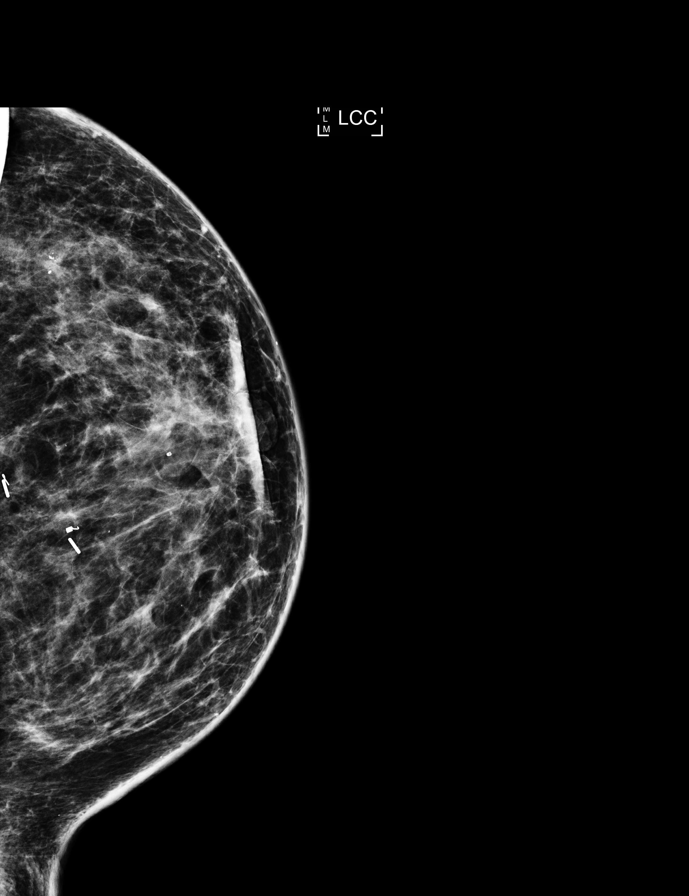

[L CC (3 of 8)]
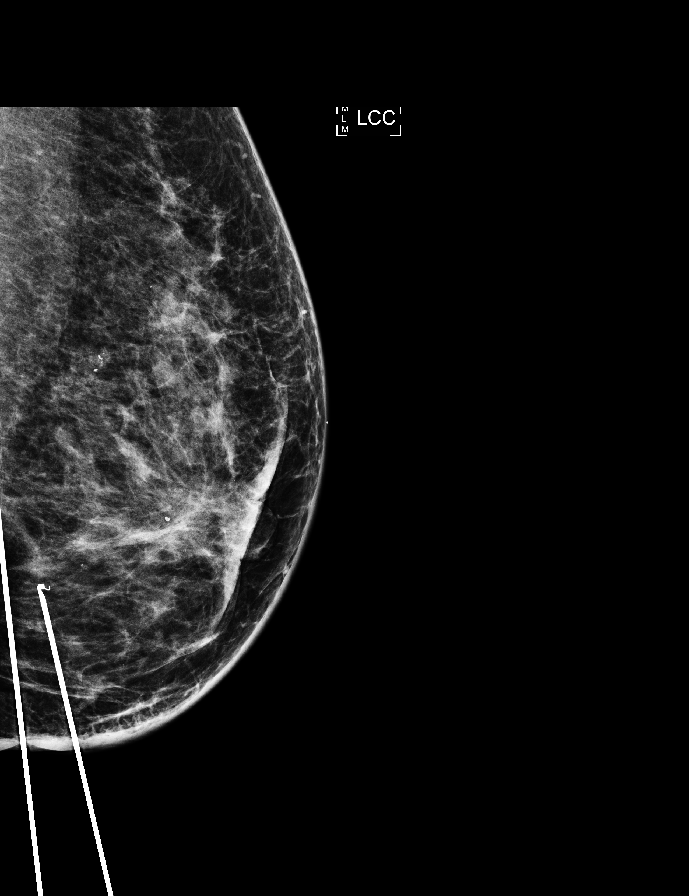

[L CC (4 of 8)]
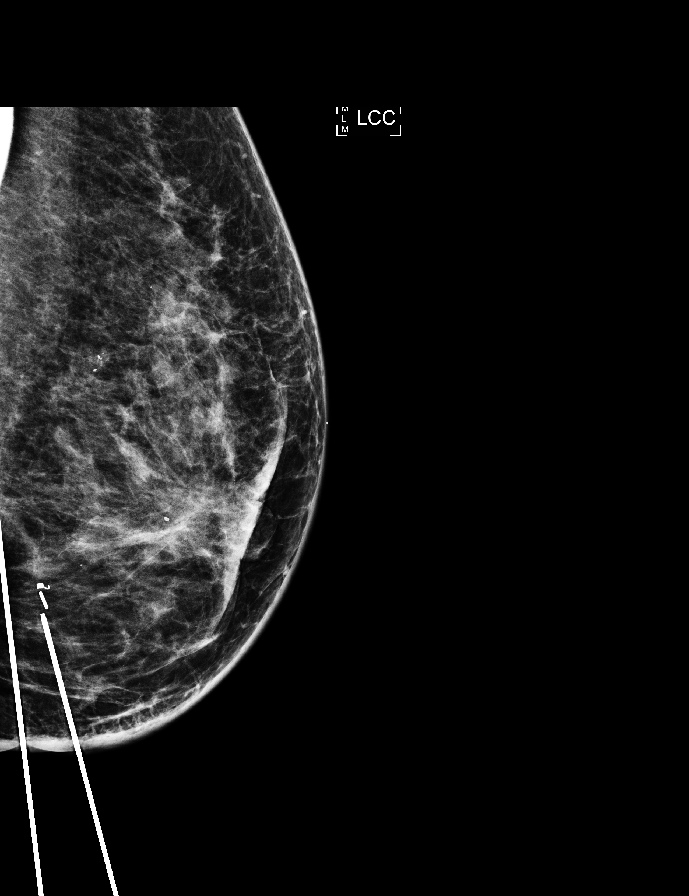

[L CC (5 of 8)]
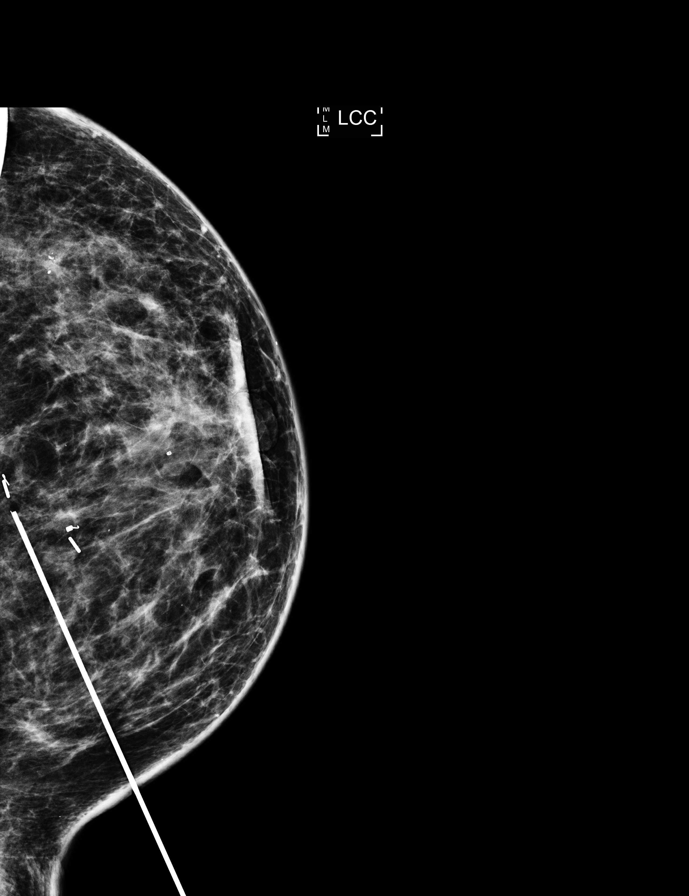

[L CC (6 of 8)]
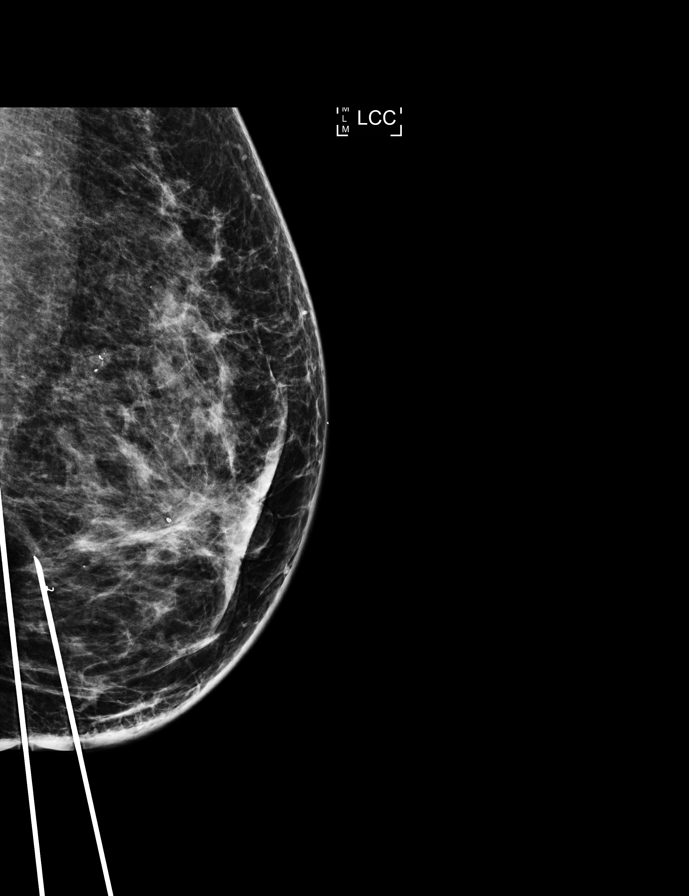

[L CC (7 of 8)]
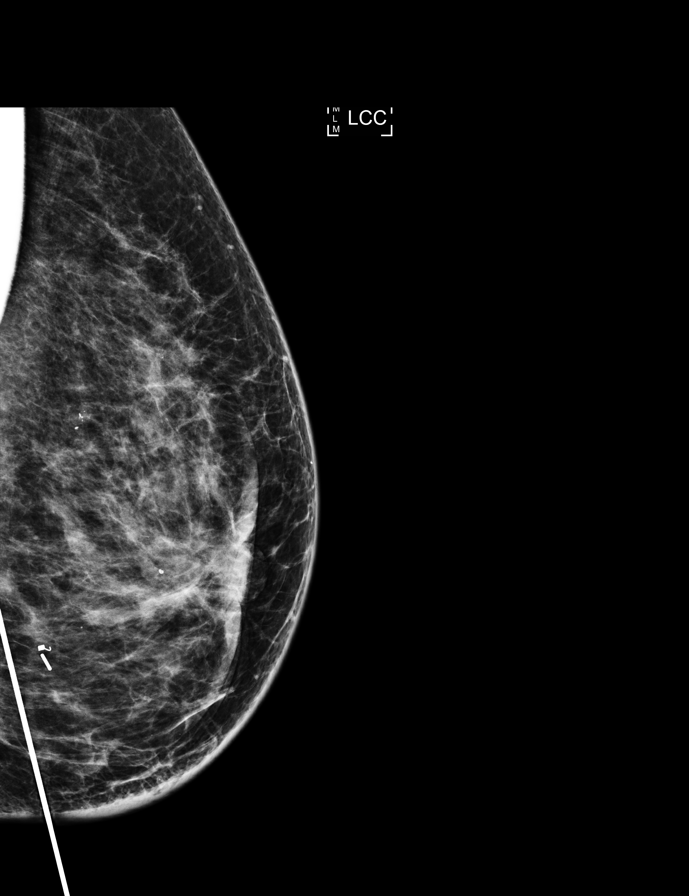

[L CC (8 of 8)]
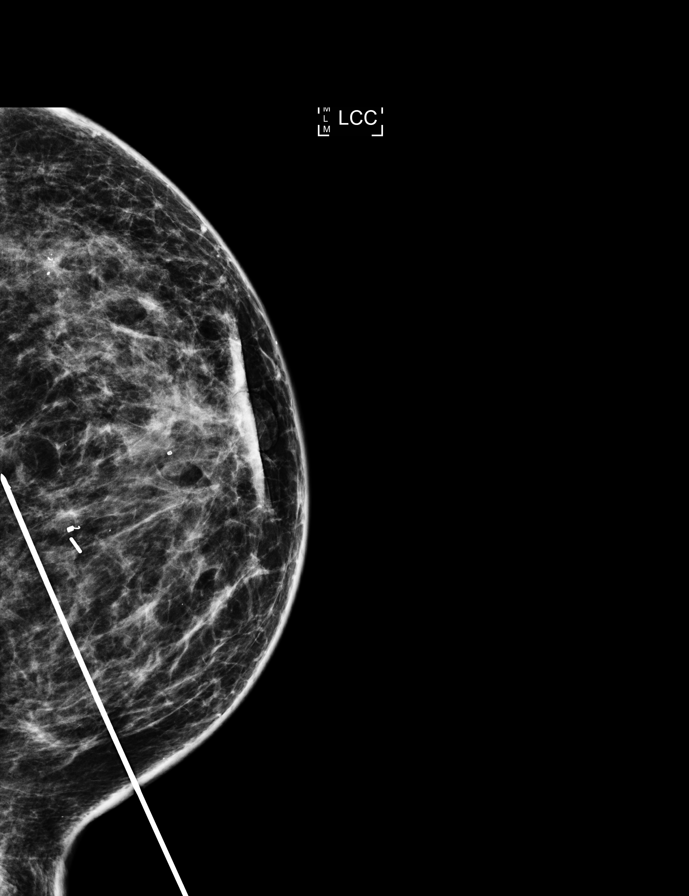

[8 of 15 positions shown; findings below may reference images not displayed]

FINDINGS: Patient presents for radioactive seed localization prior to left
lumpectomy. I met with the patient and we discussed the procedure of
seed localization including benefits and alternatives. We discussed
the high likelihood of a successful procedure. We discussed the
risks of the procedure including infection, bleeding, tissue injury
and further surgery. We discussed the low dose of radioactivity
involved in the procedure. Informed, written consent was given.

The usual time-out protocol was performed immediately prior to the
procedure.

1) Using mammographic guidance, sterile technique, 1% lidocaine and
an S-GV3 radioactive seed, the ribbon shaped biopsy marker in lower,
inner left breast was localized using a medial to lateral approach.
The follow-up mammogram images confirm the seed in the expected
location and were marked for Dr. Ram.

Follow-up survey of the patient confirms presence of the radioactive
seed.

Order number of S-GV3 seed:  [id number].

Total activity:  0.252 mCi  reference Date: [DATE] [DATE], [DATE]

[DATE]) Using mammographic guidance, sterile technique, 1% lidocaine and
an S-GV3 radioactive seed, the coil shaped biopsy marker in the
lower, inner left breast was localized using a medial to lateral
approach. The follow-up mammogram images confirm the seed in the
expected location and were marked for Dr. Ram.

Follow-up survey of the patient confirms presence of the radioactive
seed.

Order number of S-GV3 seed:  [id number].

Total activity:  0.252 mCi  reference Date: October 13, 2016

The patient tolerated the procedure well and was released from the
[REDACTED]. She was given instructions regarding seed removal.
IMPRESSION: Radioactive seed localization of the 2 left breast biopsy sites
demonstrating invasive ductal carcinoma. No apparent complications.

## 2018-06-07 DIAGNOSIS — Z940 Kidney transplant status: Secondary | ICD-10-CM | POA: Diagnosis not present

## 2018-06-07 DIAGNOSIS — Z9009 Acquired absence of other part of head and neck: Secondary | ICD-10-CM | POA: Diagnosis not present

## 2018-06-07 DIAGNOSIS — Z7952 Long term (current) use of systemic steroids: Secondary | ICD-10-CM | POA: Diagnosis not present

## 2018-06-07 DIAGNOSIS — I10 Essential (primary) hypertension: Secondary | ICD-10-CM | POA: Diagnosis not present

## 2018-06-07 DIAGNOSIS — E876 Hypokalemia: Secondary | ICD-10-CM | POA: Diagnosis not present

## 2018-06-07 DIAGNOSIS — C50919 Malignant neoplasm of unspecified site of unspecified female breast: Secondary | ICD-10-CM | POA: Diagnosis not present

## 2018-06-07 DIAGNOSIS — Z5181 Encounter for therapeutic drug level monitoring: Secondary | ICD-10-CM | POA: Diagnosis not present

## 2018-06-07 DIAGNOSIS — E212 Other hyperparathyroidism: Secondary | ICD-10-CM | POA: Diagnosis not present

## 2018-06-07 DIAGNOSIS — E891 Postprocedural hypoinsulinemia: Secondary | ICD-10-CM | POA: Diagnosis not present

## 2018-06-07 DIAGNOSIS — T8611 Kidney transplant rejection: Secondary | ICD-10-CM | POA: Diagnosis not present

## 2018-06-07 DIAGNOSIS — Z79899 Other long term (current) drug therapy: Secondary | ICD-10-CM | POA: Diagnosis not present

## 2018-07-05 DIAGNOSIS — N051 Unspecified nephritic syndrome with focal and segmental glomerular lesions: Secondary | ICD-10-CM | POA: Diagnosis not present

## 2018-07-05 DIAGNOSIS — D8989 Other specified disorders involving the immune mechanism, not elsewhere classified: Secondary | ICD-10-CM | POA: Diagnosis not present

## 2018-07-05 DIAGNOSIS — C50919 Malignant neoplasm of unspecified site of unspecified female breast: Secondary | ICD-10-CM | POA: Diagnosis not present

## 2018-07-05 DIAGNOSIS — Z86018 Personal history of other benign neoplasm: Secondary | ICD-10-CM | POA: Diagnosis not present

## 2018-07-05 DIAGNOSIS — Z79899 Other long term (current) drug therapy: Secondary | ICD-10-CM | POA: Diagnosis not present

## 2018-07-05 DIAGNOSIS — Z940 Kidney transplant status: Secondary | ICD-10-CM | POA: Diagnosis not present

## 2018-07-05 DIAGNOSIS — I10 Essential (primary) hypertension: Secondary | ICD-10-CM | POA: Diagnosis not present

## 2018-07-05 DIAGNOSIS — Z9889 Other specified postprocedural states: Secondary | ICD-10-CM | POA: Diagnosis not present

## 2018-07-05 DIAGNOSIS — Z171 Estrogen receptor negative status [ER-]: Secondary | ICD-10-CM | POA: Diagnosis not present

## 2018-07-19 DIAGNOSIS — Z940 Kidney transplant status: Secondary | ICD-10-CM | POA: Diagnosis not present

## 2018-07-19 DIAGNOSIS — Z79899 Other long term (current) drug therapy: Secondary | ICD-10-CM | POA: Diagnosis not present

## 2018-08-02 ENCOUNTER — Other Ambulatory Visit: Payer: Self-pay | Admitting: Oncology

## 2018-08-02 DIAGNOSIS — Z940 Kidney transplant status: Secondary | ICD-10-CM | POA: Diagnosis not present

## 2018-08-02 DIAGNOSIS — K219 Gastro-esophageal reflux disease without esophagitis: Secondary | ICD-10-CM | POA: Diagnosis not present

## 2018-08-02 DIAGNOSIS — Z4822 Encounter for aftercare following kidney transplant: Secondary | ICD-10-CM | POA: Diagnosis not present

## 2018-08-02 DIAGNOSIS — Z853 Personal history of malignant neoplasm of breast: Secondary | ICD-10-CM | POA: Diagnosis not present

## 2018-08-02 DIAGNOSIS — Z79899 Other long term (current) drug therapy: Secondary | ICD-10-CM | POA: Diagnosis not present

## 2018-08-02 DIAGNOSIS — C50312 Malignant neoplasm of lower-inner quadrant of left female breast: Secondary | ICD-10-CM

## 2018-08-02 DIAGNOSIS — E876 Hypokalemia: Secondary | ICD-10-CM | POA: Diagnosis not present

## 2018-08-02 DIAGNOSIS — D899 Disorder involving the immune mechanism, unspecified: Secondary | ICD-10-CM | POA: Diagnosis not present

## 2018-08-02 DIAGNOSIS — Z48288 Encounter for aftercare following multiple organ transplant: Secondary | ICD-10-CM | POA: Diagnosis not present

## 2018-08-02 DIAGNOSIS — Z5181 Encounter for therapeutic drug level monitoring: Secondary | ICD-10-CM | POA: Diagnosis not present

## 2018-08-02 DIAGNOSIS — I151 Hypertension secondary to other renal disorders: Secondary | ICD-10-CM | POA: Diagnosis not present

## 2018-08-02 DIAGNOSIS — Z7952 Long term (current) use of systemic steroids: Secondary | ICD-10-CM | POA: Diagnosis not present

## 2018-08-02 DIAGNOSIS — Z170 Estrogen receptor positive status [ER+]: Secondary | ICD-10-CM

## 2018-08-02 DIAGNOSIS — Z9221 Personal history of antineoplastic chemotherapy: Secondary | ICD-10-CM | POA: Diagnosis not present

## 2018-08-02 DIAGNOSIS — N186 End stage renal disease: Secondary | ICD-10-CM | POA: Diagnosis not present

## 2018-08-02 DIAGNOSIS — I120 Hypertensive chronic kidney disease with stage 5 chronic kidney disease or end stage renal disease: Secondary | ICD-10-CM | POA: Diagnosis not present

## 2018-08-02 DIAGNOSIS — N051 Unspecified nephritic syndrome with focal and segmental glomerular lesions: Secondary | ICD-10-CM | POA: Diagnosis not present

## 2018-08-02 DIAGNOSIS — T8611 Kidney transplant rejection: Secondary | ICD-10-CM | POA: Diagnosis not present

## 2018-08-02 NOTE — Progress Notes (Signed)
I was called today by Dr. Billey Chang from the nephrology transplant program at [facility] Re: Erika Freeman.  Bridget's creatinine is up and her calcium is up today as well at 11.6.  She has been receiving forest Sensipar for her hypoparathyroidism but Dr. Billey Chang thinks this is a new problem and of course it raises concerns regarding metastases to bone.  I am going to bring her in tomorrow for labs, fluids, visit, and I will set her up for a bone scan  I contacted the patient and she is agreeable.

## 2018-08-02 NOTE — Progress Notes (Signed)
[facility]  [phone] [phone]     ID: Erika Freeman DOB: [birth year]  MR#: [id number]  RWE#:[id number]  Patient Care Team: Patient, No Pcp Per as PCP - General (General Practice) Erika Emms, MD as Consulting Physician (Nephrology) Erika Fus, MD as Consulting Physician (Obstetrics and Gynecology) Erika Bookbinder, MD as Consulting Physician (General Surgery) Magrinat, [name], MD as Consulting Physician (Oncology) Erika Pray, MD as Consulting Physician (Radiation Oncology) Erika Minks, MD as Referring Physician (Surgery) Erika Lack, DO (Internal Medicine) OTHER MD:  CHIEF COMPLAINT: Estrogen receptor positive breast cancer  CURRENT TREATMENT: anastrozole    INTERVAL HISTORY:  Erika Freeman resturns today for follow-up and treatment of her estrogen receptor positive breast cancer.  The reason for today's visit is a call from her transplant nephrologist Dr. Billey Chang yesterday letting me know that the patient's creatinine was up as well as her calcium.  The patient has been receiving Sensipar and possibly this could be an explanation but obviously this raises concerns regarding bony metastatic spread and we brought [name] in today for evaluation  She continues on anastrozole. She has some occasional hot flashes. She also has vaginal dryness, but she has not been treating it with anything.  She is scheduled for a bone scan on 08/10/2018.   Since her last visit here, she underwent a digital diagnostic bilateral mammogram with tomography on 10/17/2017 showing: Breast Density Category C. There are eExpected postsurgical changes within the LEFT breast. No evidence of malignancy within either breast.  She recently went to her dermatologist for a spider bite to her left breast and she got a steroid shot for it.  She has developed some changes in the breast that she wanted me to see today  REVIEW OF SYSTEMS: Erika Freeman had  been working out before COVID-19, but since everything shut down she has been at home. More recently, she has gotten back outside walking away from people. At home, it is just her and her husband. She does the shopping, but she takes appropriate precautions against COVID-19. Erika Freeman notes that her sister-in-law had COVID-19 two months ago. Erika Freeman has had some mild occasional pain. Her breathing has been ok; her allergies have been making her cough some as well. The patient denies unusual headaches, visual changes, nausea, vomiting, or dizziness. There has been no phlegm production or pleurisy. This been no change in bowel or bladder habits. The patient denies unexplained fatigue or unexplained weight loss, bleeding, rash, or fever. A detailed review of systems was otherwise noncontributory.    BREAST CANCER HISTORY: From the original intake note:  "Erika Freeman" herself noted a mass in her left breast. She ignored it because she thought it was related to menstruation. However as it persisted through 1. She brought it to Dr. Verlon Freeman attention and on 05/10/2016 she underwent left diagnostic mammography with tomography and [facility]. The breast density was category C. In the left breast lower central area there was a new circumscribed mass. This was firm and fixed in the lower inner left breast at middle depth. Ultrasonography confirmed a 2.6 cm hypoechoic irregular mass at the 6:30 o'clock radiant 3 cm from the nipple. There was also a 0.5 cm nodule 1.4 cm medial to the mass just described.  Biopsy of the left breast mass in question 05/10/2016 showed([id number]) invasive ductal carcinoma, grade 3, estrogen receptor 70% positive with weak staining intensity, progesterone receptor negative, with no HER-2 amplification, the signals ratio being 1.52  and the number per cell 2.35. The proliferation marker was 90%.  Her subsequent history is as detailed below  OF NOTE: The  patient has a history of focal segmental glomerular sclerosis with end-stage renal disease and is status post unrelated donor renal transplant 04/15/2013 as part of appeared donor exchange with her husband donating one kidney, the other harvested from a 57 year old [place] man. She continues on immunosuppression with mycophenolate and tacrolimus.  PAST MEDICAL HISTORY: Past Medical History:  Diagnosis Date  . Anemia   . Breast cancer ([place])   . Cancer [facility])    breast cancer left side  . Chills   . Chronic kidney disease    resolved kidney transplant - [facility] - transplant -2015  . Cough   . Fibroid tumor   . GERD (gastroesophageal reflux disease)    resolved   . Heart murmur   . History of radiation therapy 12/20/16-01/17/17   left breast 50.4 gy in 28 fractions  . Hypertension    DX  30 YRS AGO  . Personal history of chemotherapy   . Personal history of radiation therapy   . Umbilical hernia s/p primary repair 01/10/2012 01/24/2012  . Vomiting     PAST SURGICAL HISTORY: Past Surgical History:  Procedure Laterality Date  . BREAST BIOPSY    . BREAST LUMPECTOMY Left    2018  . BREAST LUMPECTOMY WITH RADIOACTIVE SEED AND SENTINEL LYMPH NODE BIOPSY Left 11/14/2016   Procedure: LEFT BREAST RADIOACTIVE SEED BRACKETED LUMPECTOMY, LEFT AXILLARY SENTINEL LYMPH NODE BIOPSY WITH BLUE DYE INJECTION;  Surgeon: Erika Bookbinder, MD;  Location: [place];  Service: General;  Laterality: Left;  . BREAST SURGERY  2012   left - fibroadenoma  . CAPD INSERTION  01/10/2012   Procedure: LAPAROSCOPIC INSERTION CONTINUOUS AMBULATORY PERITONEAL DIALYSIS  (CAPD) CATHETER;  Surgeon: Adin Hector, MD;  Location: [place];  Service: General;  Laterality: N/A;  LAPAROSCOPIC CAPD CATHETER PLACEMENT OMENTOPEXY  . CESAREAN SECTION  1996  . COLONOSCOPY    . DILATION AND CURETTAGE OF UTERUS    . KIDNEY TRANSPLANT    . PARATHYROIDECTOMY  2000 - approximate  . PORT-A-CATH REMOVAL Right 11/14/2016   Procedure:  REMOVAL PORT-A-CATH;  Surgeon: Erika Bookbinder, MD;  Location: [place];  Service: General;  Laterality: Right;  . PORTACATH PLACEMENT Right 05/24/2016   Procedure: INSERTION PORT-A-CATH WITH [place];  Surgeon: Erika Bookbinder, MD;  Location: [place];  Service: General;  Laterality: Right;  . RENAL BIOPSY  2011  . TUBAL LIGATION    . UMBILICAL HERNIA REPAIR  01/10/2012   Procedure: HERNIA REPAIR UMBILICAL ADULT;  Surgeon: Adin Hector, MD;  Location: [place];  Service: General;  Laterality: N/A;    FAMILY HISTORY Family History  Problem Relation Age of Onset  . Cancer Maternal Uncle        lung  . Cancer Cousin        breast  . Cancer Cousin        colon  . Colon cancer Neg Hx   . Colon polyps Neg Hx   . Esophageal cancer Neg Hx   . Stomach cancer Neg Hx   . Rectal cancer Neg Hx   The patient has very little information regarding her father. Her mother is living, 87 years old as of March 2018. The patient had one full brother, diagnosed with prostate cancer in his 83s. The patient has 2 half-brothers and 1 half-sister. There is no history of breast or ovarian cancer in  the family to the patient's knowledge   GYNECOLOGIC HISTORY:  Patient's last menstrual period was 04/27/2016 (exact date).  Menarche age 20, first live birth age 56, the patient is GX P3. Her periods are regular, the last 5 or 6 days, with no heavy days. 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 Her husband white works at home [employer]. Son [name] lives in [place] and as Freight forwarder of a Therapist, art. Son [name] lives in [place] and is a Air traffic controller. Son [name] lives in [place] and is studying based trombone. The patient has 2 grandchildren. She attends a local [employer]:  not in place    HEALTH MAINTENANCE: Social History   Tobacco Use  . Smoking  status: Never Smoker  . Smokeless tobacco: Never Used  Substance Use Topics  . Alcohol use: No  . Drug use: No     Colonoscopy:  PAP:  Bone density:   No Known Allergies  Current Outpatient Medications  Medication Sig Dispense Refill  . acetaminophen (TYLENOL) 500 MG tablet Take 1,000 mg by mouth every 6 (six) hours as needed.    [name] amLODipine (NORVASC) 10 MG tablet Take 10 mg by mouth daily.    [name] anastrozole (ARIMIDEX) 1 MG tablet Take 1 tablet (1 mg total) by mouth daily. 90 tablet 4  . aspirin 81 MG tablet Take 81 mg by mouth every morning.     . cholecalciferol (VITAMIN D) 1000 units tablet Take 1 tablet (1,000 Units total) by mouth daily. 90 tablet 4  . diphenhydrAMINE (BENADRYL) 25 MG tablet Take 25 mg by mouth daily as needed for allergies.    [name] lovastatin (MEVACOR) 20 MG tablet Take 20 mg by mouth every Monday, Wednesday, and Friday. Takes at bedtime.    . potassium chloride (K-DUR,KLOR-CON) 10 MEQ tablet Take 1 tablet by mouth daily.    . predniSONE (DELTASONE) 5 MG tablet Take 5 mg by mouth every morning.     . SENSIPAR 60 MG tablet Take 1 tablet by mouth daily. Monday-friday  5  . tacrolimus (PROGRAF) 1 MG capsule Take 3 mg by mouth 2 (two) times daily. Takes '3mg'$  in the AM and '2mg'$  in the PM.     No current facility-administered medications for this visit.    Facility-Administered Medications Ordered in Other Visits  Medication Dose Route Frequency Provider Last Rate Last Dose  . 0.9 %  sodium chloride infusion   Intravenous Continuous Magrinat, [name], MD 500 mL/hr at 08/03/18 1249      OBJECTIVE: Middle-aged African-American woman in no acute distress  Vitals:   08/03/18 1127  BP: 139/87  Pulse: 83  Resp: 18  Temp: 97.8 F (36.6 C)  SpO2: 100%     Body mass index is 29.89 kg/m.  Filed Weights   08/03/18 1127  Weight: 179 lb 9.6 oz (81.5 kg)   ECOG FS:1 - Symptomatic but completely ambulatory   Sclerae unicteric, EOMs intact Wearing a mask No cervical  or supraclavicular adenopathy Lungs no rales or rhonchi Heart regular rate and rhythm Abd soft, nontender, positive bowel sounds MSK no focal spinal tenderness, no upper extremity lymphedema Neuro: nonfocal, well oriented, appropriate affect Breasts: Right breast is unremarkable.  The left breast is status post lumpectomy with no evidence of disease recurrence.  There are 2 lesions in the upper outer quadrant of the  left breast which the patient attributes to an insect bite followed by keloid formation.  This is imaged below for future reference.  Both axillae are benign  Insect Bite 08/03/2018       LAB RESULTS:  CMP     Component Value Date/Time   NA 144 08/03/2018 1107   NA 142 02/14/2017 1012   K 3.2 (L) 08/03/2018 1107   K 2.8 (LL) 02/14/2017 1012   CL 105 08/03/2018 1107   CO2 27 08/03/2018 1107   CO2 30 (H) 02/14/2017 1012   GLUCOSE 123 (H) 08/03/2018 1107   GLUCOSE 104 02/14/2017 1012   BUN 21 (H) 08/03/2018 1107   BUN 14.1 02/14/2017 1012   CREATININE 1.61 (H) 08/03/2018 1107   CREATININE 1.2 (H) 02/14/2017 1012   CALCIUM 11.5 (H) 08/03/2018 1107   CALCIUM 9.6 02/14/2017 1012   PROT 8.0 08/03/2018 1107   PROT 7.8 02/14/2017 1012   ALBUMIN 4.3 08/03/2018 1107   ALBUMIN 4.4 02/14/2017 1012   AST 18 08/03/2018 1107   AST 15 02/14/2017 1012   ALT 21 08/03/2018 1107   ALT 15 02/14/2017 1012   ALKPHOS 80 08/03/2018 1107   ALKPHOS 72 02/14/2017 1012   BILITOT 0.3 08/03/2018 1107   BILITOT 0.53 02/14/2017 1012   GFRNONAA 35 (L) 08/03/2018 1107   GFRAA 41 (L) 08/03/2018 1107    No results found for: TOTALPROTELP, ALBUMINELP, A1GS, A2GS, BETS, BETA2SER, GAMS, MSPIKE, SPEI  No results found for: Nils Pyle, [facility]  Lab Results  Component Value Date   WBC 12.2 (H) 08/03/2018   NEUTROABS 8.5 (H) 08/03/2018   HGB 11.9 (L) 08/03/2018   HCT 38.5 08/03/2018   MCV 89.3 08/03/2018   PLT 219 08/03/2018      Chemistry      Component Value  Date/Time   NA 144 08/03/2018 1107   NA 142 02/14/2017 1012   K 3.2 (L) 08/03/2018 1107   K 2.8 (LL) 02/14/2017 1012   CL 105 08/03/2018 1107   CO2 27 08/03/2018 1107   CO2 30 (H) 02/14/2017 1012   BUN 21 (H) 08/03/2018 1107   BUN 14.1 02/14/2017 1012   CREATININE 1.61 (H) 08/03/2018 1107   CREATININE 1.2 (H) 02/14/2017 1012      Component Value Date/Time   CALCIUM 11.5 (H) 08/03/2018 1107   CALCIUM 9.6 02/14/2017 1012   ALKPHOS 80 08/03/2018 1107   ALKPHOS 72 02/14/2017 1012   AST 18 08/03/2018 1107   AST 15 02/14/2017 1012   ALT 21 08/03/2018 1107   ALT 15 02/14/2017 1012   BILITOT 0.3 08/03/2018 1107   BILITOT 0.53 02/14/2017 1012       No results found for: LABCA2  No components found for: IEPPIR518  No results for input(s): INR in the last 168 hours.  Urinalysis    Component Value Date/Time   COLORURINE YELLOW 11/24/2006 1539   APPEARANCEUR CLEAR 11/24/2006 1539   LABSPEC 1.018 11/24/2006 1539   PHURINE 6.0 11/24/2006 1539   GLUCOSEU NEGATIVE 11/24/2006 1539   HGBUR SMALL (A) 11/24/2006 1539   BILIRUBINUR NEGATIVE 11/24/2006 1539   KETONESUR NEGATIVE 11/24/2006 1539   PROTEINUR 100 (A) 11/24/2006 1539   UROBILINOGEN 1.0 11/24/2006 1539   NITRITE NEGATIVE 11/24/2006 1539   LEUKOCYTESUR MODERATE (A) 11/24/2006 1539     STUDIES: No results found.   ELIGIBLE FOR AVAILABLE RESEARCH PROTOCOL: no  ASSESSMENT: 58 y.o. [place], [place] woman status post left breast lower inner quadrant biopsy 05/10/2016 for a clinical  T2  N0, prognostic stage IIB invaside ductal carcinoma, grade 3, weekly estrogen receptor positive, progesterone receptor negative, with no HER-2 amplification, and the MIB-1 at 90%  (a) biopsy of the 0.6 mm satellite at the 6:30 o'clock radiant in the left breast 06/02/2016 showed invasive ductal carcinoma, grade 3, estrogen receptor 20% positive, with weak staining intensity, progesterone receptor negative with no HER-2 amplification, and an MIB-1 of  70% (identical to larger mass)   (1) neoadjuvant chemotherapy consisting of doxorubicin and cyclophosphamide in dose dense fashion 4 started 06/01/2016, completed 07/12/2016  followed by Paclitaxel weekly 12 started 07/26/2016, completed 10/11/2016  (2) definitive surgery 11/14/2016 found a residual pT1a pN0 area of invasive ductal carcinoma (0.2 mm), with negative margins.  (3) adjuvant radiation 12/20/16-01/17/17 Site/dose:   Left Breast, 50.4 Gy total delivered in 28 fractions   (4) started anastrozole 02/28/2017  (a) left heel DEXA scan 11/24/2004 was normal  (b) renal feels any additional radiation may be detrimental so bone density not updated  (5) hypercalcemia with worsening renal function  (a) denosumab 60 mg and IV fluids given 08/03/2018  (b) bone scan 08/10/2018   PLAN: [name] is now close to 2 years out from definitive surgery for her breast cancer.  She continues on anastrozole, with good tolerance.  She now has a change in her kidney function and an increase in her calcium level.  She has some aches and pains here and there but no focal persistent bony pain.  There are no other symptoms suggestive of disease recurrence.  We are going to give her some fluids today and denosumab 60 mg to bring the calcium down.  Dr. Billey Chang is already changing the Sensipar dose.  I have set Bridget up for a bone scan scheduled for 6 2.  If that is negative then I think we are dealing with a primary kidney issue and that of course will be managed by her transplant nephrologist's  She is due for repeat mammography in August.  She will see me again in September.  She knows to call for any other issue that may develop before her next visit here.    Magrinat, [name], MD  08/03/18 1:05 PM Medical Oncology and Hematology [facility] [address] [place], [place] [address] Tel. [phone]    Fax. [phone]  I, Jacqualyn Posey am acting as a Education administrator for [name], MD.   I, Lurline Del MD, have reviewed the above documentation for accuracy and completeness, and I agree with the above.

## 2018-08-03 ENCOUNTER — Inpatient Hospital Stay: Payer: Medicare Other | Attending: Oncology

## 2018-08-03 ENCOUNTER — Inpatient Hospital Stay (HOSPITAL_BASED_OUTPATIENT_CLINIC_OR_DEPARTMENT_OTHER): Payer: Medicare Other | Admitting: Oncology

## 2018-08-03 ENCOUNTER — Inpatient Hospital Stay: Payer: Medicare Other

## 2018-08-03 ENCOUNTER — Other Ambulatory Visit: Payer: Self-pay

## 2018-08-03 VITALS — BP 139/87 | HR 83 | Temp 97.8°F | Resp 18 | Ht 65.0 in | Wt 179.6 lb

## 2018-08-03 DIAGNOSIS — Z800 Family history of malignant neoplasm of digestive organs: Secondary | ICD-10-CM

## 2018-08-03 DIAGNOSIS — Z170 Estrogen receptor positive status [ER+]: Secondary | ICD-10-CM | POA: Insufficient documentation

## 2018-08-03 DIAGNOSIS — C7951 Secondary malignant neoplasm of bone: Secondary | ICD-10-CM | POA: Diagnosis not present

## 2018-08-03 DIAGNOSIS — Z803 Family history of malignant neoplasm of breast: Secondary | ICD-10-CM | POA: Diagnosis not present

## 2018-08-03 DIAGNOSIS — N185 Chronic kidney disease, stage 5: Secondary | ICD-10-CM

## 2018-08-03 DIAGNOSIS — Z801 Family history of malignant neoplasm of trachea, bronchus and lung: Secondary | ICD-10-CM

## 2018-08-03 DIAGNOSIS — R232 Flushing: Secondary | ICD-10-CM | POA: Insufficient documentation

## 2018-08-03 DIAGNOSIS — T63301A Toxic effect of unspecified spider venom, accidental (unintentional), initial encounter: Secondary | ICD-10-CM | POA: Insufficient documentation

## 2018-08-03 DIAGNOSIS — Z940 Kidney transplant status: Secondary | ICD-10-CM | POA: Insufficient documentation

## 2018-08-03 DIAGNOSIS — N269 Renal sclerosis, unspecified: Secondary | ICD-10-CM | POA: Insufficient documentation

## 2018-08-03 DIAGNOSIS — C50312 Malignant neoplasm of lower-inner quadrant of left female breast: Secondary | ICD-10-CM | POA: Diagnosis not present

## 2018-08-03 DIAGNOSIS — N186 End stage renal disease: Secondary | ICD-10-CM

## 2018-08-03 LAB — CBC WITH DIFFERENTIAL/PLATELET
Abs Immature Granulocytes: 0.1 10*3/uL — ABNORMAL HIGH (ref 0.00–0.07)
Basophils Absolute: 0.1 10*3/uL (ref 0.0–0.1)
Basophils Relative: 0 %
Eosinophils Absolute: 0.2 10*3/uL (ref 0.0–0.5)
Eosinophils Relative: 2 %
HCT: 38.5 % (ref 36.0–46.0)
Hemoglobin: 11.9 g/dL — ABNORMAL LOW (ref 12.0–15.0)
Immature Granulocytes: 1 %
Lymphocytes Relative: 20 %
Lymphs Abs: 2.4 10*3/uL (ref 0.7–4.0)
MCH: 27.6 pg (ref 26.0–34.0)
MCHC: 30.9 g/dL (ref 30.0–36.0)
MCV: 89.3 fL (ref 80.0–100.0)
Monocytes Absolute: 0.9 10*3/uL (ref 0.1–1.0)
Monocytes Relative: 8 %
Neutro Abs: 8.5 10*3/uL — ABNORMAL HIGH (ref 1.7–7.7)
Neutrophils Relative %: 69 %
Platelets: 219 10*3/uL (ref 150–400)
RBC: 4.31 MIL/uL (ref 3.87–5.11)
RDW: 13.3 % (ref 11.5–15.5)
WBC: 12.2 10*3/uL — ABNORMAL HIGH (ref 4.0–10.5)
nRBC: 0 % (ref 0.0–0.2)

## 2018-08-03 LAB — COMPREHENSIVE METABOLIC PANEL
ALT: 21 U/L (ref 0–44)
AST: 18 U/L (ref 15–41)
Albumin: 4.3 g/dL (ref 3.5–5.0)
Alkaline Phosphatase: 80 U/L (ref 38–126)
Anion gap: 12 (ref 5–15)
BUN: 21 mg/dL — ABNORMAL HIGH (ref 6–20)
CO2: 27 mmol/L (ref 22–32)
Calcium: 11.5 mg/dL — ABNORMAL HIGH (ref 8.9–10.3)
Chloride: 105 mmol/L (ref 98–111)
Creatinine, Ser: 1.61 mg/dL — ABNORMAL HIGH (ref 0.44–1.00)
GFR calc Af Amer: 41 mL/min — ABNORMAL LOW (ref >60)
GFR calc non Af Amer: 35 mL/min — ABNORMAL LOW (ref >60)
Glucose, Bld: 123 mg/dL — ABNORMAL HIGH (ref 70–99)
Potassium: 3.2 mmol/L — ABNORMAL LOW (ref 3.5–5.1)
Sodium: 144 mmol/L (ref 135–145)
Total Bilirubin: 0.3 mg/dL (ref 0.3–1.2)
Total Protein: 8 g/dL (ref 6.5–8.1)

## 2018-08-03 MED ORDER — DENOSUMAB 60 MG/ML ~~LOC~~ SOSY
60.0000 mg | PREFILLED_SYRINGE | Freq: Once | SUBCUTANEOUS | Status: AC
  Administered 2018-08-03: 60 mg via SUBCUTANEOUS

## 2018-08-03 MED ORDER — DENOSUMAB 60 MG/ML ~~LOC~~ SOSY
PREFILLED_SYRINGE | SUBCUTANEOUS | Status: AC
  Filled 2018-08-03: qty 1

## 2018-08-03 MED ORDER — SODIUM CHLORIDE 0.9 % IV SOLN
INTRAVENOUS | Status: DC
  Administered 2018-08-03: 13:00:00 via INTRAVENOUS
  Filled 2018-08-03 (×2): qty 250

## 2018-08-03 MED ORDER — DENOSUMAB 120 MG/1.7ML ~~LOC~~ SOLN: 60.0000 mg | Freq: Once | SUBCUTANEOUS | Status: DC

## 2018-08-03 NOTE — Progress Notes (Signed)
Confirmed with Dr. Jana Hakim. Prolia 60mg  x 1 today.  Hardie Pulley, PharmD, BCPS, BCOP

## 2018-08-03 NOTE — Patient Instructions (Addendum)
Rehydration, Adult Rehydration is the replacement of body fluids and salts and minerals (electrolytes) that are lost during dehydration. Dehydration is when there is not enough fluid or water in the body. This happens when you lose more fluids than you take in. Common causes of dehydration include:  Vomiting.  Diarrhea.  Excessive sweating, such as from heat exposure or exercise.  Taking medicines that cause the body to lose excess fluid (diuretics).  Impaired kidney function.  Not drinking enough fluid.  Certain illnesses or infections.  Certain poorly controlled long-term (chronic) illnesses, such as diabetes, heart disease, and kidney disease.  Symptoms of mild dehydration may include thirst, dry lips and mouth, dry skin, and dizziness. Symptoms of severe dehydration may include increased heart rate, confusion, fainting, and not urinating. You can rehydrate by drinking certain fluids or getting fluids through an IV tube, as told by your health care provider. What are the risks? Generally, rehydration is safe. However, one problem that can happen is taking in too much fluid (overhydration). This is rare. If overhydration happens, it can cause an electrolyte imbalance, kidney failure, or a decrease in salt (sodium) levels in the body. How to rehydrate Follow instructions from your health care provider for rehydration. The kind of fluid you should drink and the amount you should drink depend on your condition.  If directed by your health care provider, drink an oral rehydration solution (ORS). This is a drink designed to treat dehydration that is found in pharmacies and retail stores. ? Make an ORS by following instructions on the package. ? Start by drinking small amounts, about  cup (120 mL) every 5-10 minutes. ? Slowly increase how much you drink until you have taken the amount recommended by your health care provider.  Drink enough clear fluids to keep your urine clear or pale  yellow. If you were instructed to drink an ORS, finish the ORS first, then start slowly drinking other clear fluids. Drink fluids such as: ? Water. Do not drink only water. Doing that can lead to having too little sodium in your body (hyponatremia). ? Ice chips. ? Fruit juice that you have added water to (diluted juice). ? Low-calorie sports drinks.  If you are severely dehydrated, your health care provider may recommend that you receive fluids through an IV tube in the hospital.  Do not take sodium tablets. Doing that can lead to the condition of having too much sodium in your body (hypernatremia). Eating while you rehydrate Follow instructions from your health care provider about what to eat while you rehydrate. Your health care provider may recommend that you slowly begin eating regular foods in small amounts.  Eat foods that contain a healthy balance of electrolytes, such as bananas, oranges, potatoes, tomatoes, and spinach.  Avoid foods that are greasy or contain a lot of fat or sugar.  In some cases, you may get nutrition through a feeding tube that is passed through your nose and into your stomach (nasogastric tube, or NG tube). This may be done if you have uncontrolled vomiting or diarrhea. Beverages to avoid Certain beverages may make dehydration worse. While you rehydrate, avoid:  Alcohol.  Caffeine.  Drinks that contain a lot of sugar. These include: ? High-calorie sports drinks. ? Fruit juice that is not diluted. ? Soda.  Check nutrition labels to see how much sugar or caffeine a beverage contains. Signs of dehydration recovery You may be recovering from dehydration if:  You are urinating more often than before you started  rehydrating.  Your urine is clear or pale yellow.  Your energy level improves.  You vomit less frequently.  You have diarrhea less frequently.  Your appetite improves or returns to normal.  You feel less dizzy or less light-headed.  Your  skin tone and color start to look more normal. Contact a health care provider if:  You continue to have symptoms of mild dehydration, such as: ? Thirst. ? Dry lips. ? Slightly dry mouth. ? Dry, warm skin. ? Dizziness.  You continue to vomit or have diarrhea. Get help right away if:  You have symptoms of dehydration that get worse.  You feel: ? Confused. ? Weak. ? Like you are going to faint.  You have not urinated in 6-8 hours.  You have very dark urine.  You have trouble breathing.  Your heart rate while sitting still is over 100 beats a minute.  You cannot drink fluids without vomiting.  You have vomiting or diarrhea that: ? Gets worse. ? Does not go away.  You have a fever. This information is not intended to replace advice given to you by your health care provider. Make sure you discuss any questions you have with your health care provider. Document Released: 05/09/2011 Document Revised: 09/04/2015 Document Reviewed: 04/10/2015 Elsevier Interactive Patient Education  2019 [employer]  Denosumab injection What is this medicine? DENOSUMAB (den oh sue mab) slows bone breakdown. Prolia is used to treat osteoporosis in women after menopause and in men, and in people who are taking corticosteroids for 6 months or more. Delton See is used to treat a high calcium level due to cancer and to prevent bone fractures and other bone problems caused by multiple myeloma or cancer bone metastases. Delton See is also used to treat giant cell tumor of the bone. This medicine may be used for other purposes; ask your health care provider or pharmacist if you have questions. COMMON BRAND NAME(S): Prolia, XGEVA What should I tell my health care provider before I take this medicine? They need to know if you have any of these conditions: -dental disease -having surgery or tooth extraction -infection -kidney disease -low levels of calcium or Vitamin D in the blood -malnutrition -on  hemodialysis -skin conditions or sensitivity -thyroid or parathyroid disease -an unusual reaction to denosumab, other medicines, foods, dyes, or preservatives -pregnant or trying to get pregnant -breast-feeding How should I use this medicine? This medicine is for injection under the skin. It is given by a health care professional in a hospital or clinic setting. A special MedGuide will be given to you before each treatment. Be sure to read this information carefully each time. For Prolia, talk to your pediatrician regarding the use of this medicine in children. Special care may be needed. For Delton See, talk to your pediatrician regarding the use of this medicine in children. While this drug may be prescribed for children as young as 13 years for selected conditions, precautions do apply. Overdosage: If you think you have taken too much of this medicine contact a poison control center or emergency room at once. NOTE: This medicine is only for you. Do not share this medicine with others. What if I miss a dose? It is important not to miss your dose. Call your doctor or health care professional if you are unable to keep an appointment. What may interact with this medicine? Do not take this medicine with any of the following medications: -other medicines containing denosumab This medicine may also interact with the following medications: -  medicines that lower your chance of fighting infection -steroid medicines like prednisone or cortisone This list may not describe all possible interactions. Give your health care provider a list of all the medicines, herbs, non-prescription drugs, or dietary supplements you use. Also tell them if you smoke, drink alcohol, or use illegal drugs. Some items may interact with your medicine. What should I watch for while using this medicine? Visit your doctor or health care professional for regular checks on your progress. Your doctor or health care professional may order  blood tests and other tests to see how you are doing. Call your doctor or health care professional for advice if you get a fever, chills or sore throat, or other symptoms of a cold or flu. Do not treat yourself. This drug may decrease your body's ability to fight infection. Try to avoid being around people who are sick. You should make sure you get enough calcium and vitamin D while you are taking this medicine, unless your doctor tells you not to. Discuss the foods you eat and the vitamins you take with your health care professional. See your dentist regularly. Brush and floss your teeth as directed. Before you have any dental work done, tell your dentist you are receiving this medicine. Do not become pregnant while taking this medicine or for 5 months after stopping it. Talk with your doctor or health care professional about your birth control options while taking this medicine. Women should inform their doctor if they wish to become pregnant or think they might be pregnant. There is a potential for serious side effects to an unborn child. Talk to your health care professional or pharmacist for more information. What side effects may I notice from receiving this medicine? Side effects that you should report to your doctor or health care professional as soon as possible: -allergic reactions like skin rash, itching or hives, swelling of the face, lips, or tongue -bone pain -breathing problems -dizziness -jaw pain, especially after dental work -redness, blistering, peeling of the skin -signs and symptoms of infection like fever or chills; cough; sore throat; pain or trouble passing urine -signs of low calcium like fast heartbeat, muscle cramps or muscle pain; pain, tingling, numbness in the hands or feet; seizures -unusual bleeding or bruising -unusually weak or tired Side effects that usually do not require medical attention (report to your doctor or health care professional if they continue or are  bothersome): -constipation -diarrhea -headache -joint pain -loss of appetite -muscle pain -runny nose -tiredness -upset stomach This list may not describe all possible side effects. Call your doctor for medical advice about side effects. You may report side effects to FDA at 1-800-FDA-1088. Where should I keep my medicine? This medicine is only given in a clinic, doctor's office, or other health care setting and will not be stored at home. NOTE: This sheet is a summary. It may not cover all possible information. If you have questions about this medicine, talk to your doctor, pharmacist, or health care provider.  2019 Elsevier/[employer] Standard (2017-06-23 16:10:44)

## 2018-08-04 LAB — CANCER ANTIGEN 27.29: CA 27.29: 21.7 U/mL (ref 0.0–38.6)

## 2018-08-06 ENCOUNTER — Telehealth: Payer: Self-pay

## 2018-08-06 NOTE — Telephone Encounter (Signed)
-----   Message from Gardenia Phlegm, NP sent at 08/06/2018 12:43 PM EDT ----- Patient potassium is improving, today it is 3.2.  Is she taking potassium?   ----- Message ----- From: Buel Ream, [facility] [place] Sent: 08/03/2018  11:24 AM EDT To: [name], MD

## 2018-08-06 NOTE — Telephone Encounter (Signed)
Spoke with patient to inform of potassium results.  She is not currently taking any potassium supplements.  Will make provider aware.

## 2018-08-09 NOTE — Telephone Encounter (Signed)
Recommend potassium rich diet and fax results to pcp for follow up

## 2018-08-10 ENCOUNTER — Other Ambulatory Visit: Payer: Self-pay

## 2018-08-10 ENCOUNTER — Ambulatory Visit (HOSPITAL_COMMUNITY)
Admission: RE | Admit: 2018-08-10 | Discharge: 2018-08-10 | Disposition: A | Discharge status: Home / Self Care | Payer: Medicare Other | Source: Ambulatory Visit | Attending: Oncology | Admitting: Oncology

## 2018-08-10 DIAGNOSIS — C50312 Malignant neoplasm of lower-inner quadrant of left female breast: Secondary | ICD-10-CM | POA: Insufficient documentation

## 2018-08-10 DIAGNOSIS — Z170 Estrogen receptor positive status [ER+]: Secondary | ICD-10-CM | POA: Diagnosis not present

## 2018-08-10 DIAGNOSIS — C50919 Malignant neoplasm of unspecified site of unspecified female breast: Secondary | ICD-10-CM | POA: Diagnosis not present

## 2018-08-10 MED ORDER — TECHNETIUM TC 99M MEDRONATE IV KIT
20.3000 | PACK | Freq: Once | INTRAVENOUS | Status: AC | PRN
  Administered 2018-08-10: 20.3 via INTRAVENOUS

## 2018-08-12 ENCOUNTER — Other Ambulatory Visit: Payer: Self-pay | Admitting: Oncology

## 2018-08-12 NOTE — Progress Notes (Unsigned)
Called and left a message with the good news on the bone scan

## 2018-08-30 DIAGNOSIS — Z940 Kidney transplant status: Secondary | ICD-10-CM | POA: Diagnosis not present

## 2018-08-30 DIAGNOSIS — Z4822 Encounter for aftercare following kidney transplant: Secondary | ICD-10-CM | POA: Diagnosis not present

## 2018-09-27 DIAGNOSIS — D225 Melanocytic nevi of trunk: Secondary | ICD-10-CM | POA: Diagnosis not present

## 2018-09-27 DIAGNOSIS — D2239 Melanocytic nevi of other parts of face: Secondary | ICD-10-CM | POA: Diagnosis not present

## 2018-09-27 DIAGNOSIS — L814 Other melanin hyperpigmentation: Secondary | ICD-10-CM | POA: Diagnosis not present

## 2018-09-27 DIAGNOSIS — L918 Other hypertrophic disorders of the skin: Secondary | ICD-10-CM | POA: Diagnosis not present

## 2018-10-05 DIAGNOSIS — Z940 Kidney transplant status: Secondary | ICD-10-CM | POA: Diagnosis not present

## 2018-10-05 DIAGNOSIS — N058 Unspecified nephritic syndrome with other morphologic changes: Secondary | ICD-10-CM | POA: Diagnosis not present

## 2018-10-05 DIAGNOSIS — E139 Other specified diabetes mellitus without complications: Secondary | ICD-10-CM | POA: Diagnosis not present

## 2018-10-05 DIAGNOSIS — E669 Obesity, unspecified: Secondary | ICD-10-CM | POA: Diagnosis not present

## 2018-10-05 DIAGNOSIS — I10 Essential (primary) hypertension: Secondary | ICD-10-CM | POA: Diagnosis not present

## 2018-10-05 DIAGNOSIS — E876 Hypokalemia: Secondary | ICD-10-CM | POA: Diagnosis not present

## 2018-10-05 DIAGNOSIS — C50919 Malignant neoplasm of unspecified site of unspecified female breast: Secondary | ICD-10-CM | POA: Diagnosis not present

## 2018-10-05 DIAGNOSIS — E212 Other hyperparathyroidism: Secondary | ICD-10-CM | POA: Diagnosis not present

## 2018-10-11 DIAGNOSIS — Z6830 Body mass index (BMI) 30.0-30.9, adult: Secondary | ICD-10-CM | POA: Diagnosis not present

## 2018-10-11 DIAGNOSIS — Z124 Encounter for screening for malignant neoplasm of cervix: Secondary | ICD-10-CM | POA: Diagnosis not present

## 2018-10-19 ENCOUNTER — Other Ambulatory Visit: Payer: Self-pay

## 2018-10-19 ENCOUNTER — Ambulatory Visit
Admission: RE | Admit: 2018-10-19 | Discharge: 2018-10-19 | Disposition: A | Discharge status: Home / Self Care | Payer: Medicare Other | Source: Ambulatory Visit | Attending: Oncology | Admitting: Oncology

## 2018-10-19 DIAGNOSIS — Z170 Estrogen receptor positive status [ER+]: Secondary | ICD-10-CM

## 2018-10-19 DIAGNOSIS — C50312 Malignant neoplasm of lower-inner quadrant of left female breast: Secondary | ICD-10-CM

## 2018-10-19 DIAGNOSIS — R922 Inconclusive mammogram: Secondary | ICD-10-CM | POA: Diagnosis not present

## 2018-10-31 NOTE — Progress Notes (Signed)
[name]  Telephone:([phone]) [phone] Fax:([phone]) [phone]     ID: Erika Freeman DOB: 1960-07-31  MR#: [id number]  KZS#:[id number]  Patient Care Team: Patient, No Pcp Per as PCP - General (General Practice) [name], MD as Consulting Physician (Nephrology) [name], MD as Consulting Physician (Obstetrics and Gynecology) [name], MD as Consulting Physician (General Surgery) Zakery Normington, [name], MD as Consulting Physician (Oncology) [name], MD as Consulting Physician (Radiation Oncology) [name], MD as Referring Physician (Surgery) [name], DO (Internal Medicine) OTHER MD:  CHIEF COMPLAINT: Estrogen receptor positive breast cancer  CURRENT TREATMENT: anastrozole    INTERVAL HISTORY:  Erika Freeman resturns today for follow-up and treatment of her estrogen receptor positive breast cancer.  Her husband Erika Freeman participated through Erika Freeman  She continues on anastrozole.  She tolerates this well, with no side effects obviously due to this medication that she reports.  She does have mild insomnia which is a common postmenopausal problem of course.  Since her last visit, she underwent bone scan on 08/10/2018, which showed: no bony metastatic disease.  She also underwent bilateral diagnostic mammogram on 10/19/2018 at [facility], which showed: breast density category C; no evidence of malignancy in either breast.   REVIEW OF SYSTEMS: Erika Freeman is very concerned because she has gained a lot of weight.  She is exercising by walking 3 miles a day and doing some strength training's.  She does not have a very strict diet however.  Aside from this a detailed review of systems today was stable.   BREAST CANCER HISTORY: From the original intake note:  "Erika Freeman" herself noted a mass in her left breast. She ignored it because she thought it was related to menstruation. However as it persisted through 1. She brought it to Dr. [name]  attention and on 05/10/2016 she underwent left diagnostic mammography with tomography and [facility]. The breast density was category C. In the left breast lower central area there was a new circumscribed mass. This was firm and fixed in the lower inner left breast at middle depth. Ultrasonography confirmed a 2.6 cm hypoechoic irregular mass at the 6:30 o'clock radiant 3 cm from the nipple. There was also a 0.5 cm nodule 1.4 cm medial to the mass just described.  Biopsy of the left breast mass in question 05/10/2016 showed([id number]) invasive ductal carcinoma, grade 3, estrogen receptor 70% positive with weak staining intensity, progesterone receptor negative, with no HER-2 amplification, the signals ratio being 1.52 and the number per cell 2.35. The proliferation marker was 90%.  Her subsequent history is as detailed below  OF NOTE: The patient has a history of focal segmental glomerular sclerosis with end-stage renal disease and is status post unrelated donor renal transplant 04/15/2013 as part of appeared donor exchange with her husband donating one kidney, the other harvested from a 57 year old [place] man. She continues on immunosuppression with mycophenolate and tacrolimus.   PAST MEDICAL HISTORY: Past Medical History:  Diagnosis Date   Anemia    Breast cancer ([place])    Cancer ([place])    breast cancer left side   Chills    Chronic kidney disease    resolved kidney transplant - [facility] - transplant -2015   Cough    Fibroid tumor    GERD (gastroesophageal reflux disease)    resolved    Heart murmur    History of radiation therapy 12/20/16-01/17/17   left breast 50.4 gy in 28 fractions  Hypertension    DX  30 YRS AGO   Personal history of chemotherapy    Personal history of radiation therapy    Umbilical hernia s/p primary repair 01/10/2012 01/24/2012   Vomiting     PAST SURGICAL HISTORY: Past Surgical History:  Procedure  Laterality Date   BREAST BIOPSY     BREAST LUMPECTOMY Left    2018   BREAST LUMPECTOMY WITH RADIOACTIVE SEED AND SENTINEL LYMPH NODE BIOPSY Left 11/14/2016   Procedure: LEFT BREAST RADIOACTIVE SEED BRACKETED LUMPECTOMY, LEFT AXILLARY SENTINEL LYMPH NODE BIOPSY WITH BLUE DYE INJECTION;  Surgeon: [name], MD;  Location: [facility];  Service: General;  Laterality: Left;   BREAST SURGERY  2012   left - fibroadenoma   CAPD INSERTION  01/10/2012   Procedure: LAPAROSCOPIC INSERTION CONTINUOUS AMBULATORY PERITONEAL DIALYSIS  (CAPD) CATHETER;  Surgeon: Adin Hector, MD;  Location: [facility];  Service: General;  Laterality: N/A;  LAPAROSCOPIC CAPD CATHETER PLACEMENT OMENTOPEXY   [place]  2000 - approximate   PORT-A-CATH REMOVAL Right 11/14/2016   Procedure: REMOVAL PORT-A-CATH;  Surgeon: [name], MD;  Location: [facility];  Service: General;  Laterality: Right;   PORTACATH PLACEMENT Right 05/24/2016   Procedure: INSERTION PORT-A-CATH WITH [place];  Surgeon: [name], MD;  Location: [facility];  Service: General;  Laterality: Right;   RENAL BIOPSY  2011   TUBAL LIGATION     UMBILICAL HERNIA REPAIR  01/10/2012   Procedure: HERNIA REPAIR UMBILICAL ADULT;  Surgeon: Adin Hector, MD;  Location: [facility];  Service: General;  Laterality: N/A;    FAMILY HISTORY Family History  Problem Relation Age of Onset   Cancer Maternal Uncle        lung   Cancer Cousin        breast   Cancer Cousin        colon   Colon cancer Neg Hx    Colon polyps Neg Hx    Esophageal cancer Neg Hx    Stomach cancer Neg Hx    Rectal cancer Neg Hx   The patient has very little information regarding her father. Her mother is living, 87 years old as of March 2018. The patient had one full brother, diagnosed with prostate cancer in his 27s. The patient has 2 half-brothers and 1  half-sister. There is no history of breast or ovarian cancer in the family to the patient's knowledge    GYNECOLOGIC HISTORY:  Patient's last menstrual period was 04/27/2016 (exact date).  Menarche age 37, first live birth age 86, the patient is GX P3. Her periods are regular, the last 5 or 6 days, with no heavy days. 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 Her husband white works at home [employer]. Son [name] lives in [place] and as Freight forwarder of a Therapist, art. Son [name] lives in [place] and is a Air traffic controller. Son [name] lives in [place] and is studying based trombone. The patient has 2 grandchildren. She attends a local [employer]:  not in place    HEALTH MAINTENANCE: Social History   Tobacco Use   Smoking status: Never Smoker   Smokeless tobacco: Never Used  Substance Use Topics   Alcohol use: No  Drug use: No     Colonoscopy:  PAP:  Bone density:   No Known Allergies  Current Outpatient Medications  Medication Sig Dispense Refill   acetaminophen (TYLENOL) 500 MG tablet Take 1,000 mg by mouth every 6 (six) hours as needed.     amLODipine (NORVASC) 10 MG tablet Take 10 mg by mouth daily.     anastrozole (ARIMIDEX) 1 MG tablet Take 1 tablet (1 mg total) by mouth daily. 90 tablet 4   aspirin 81 MG tablet Take 81 mg by mouth every morning.      cholecalciferol (VITAMIN D) 1000 units tablet Take 1 tablet (1,000 Units total) by mouth daily. 90 tablet 4   diphenhydrAMINE (BENADRYL) 25 MG tablet Take 25 mg by mouth daily as needed for allergies.     lovastatin (MEVACOR) 20 MG tablet Take 20 mg by mouth every Monday, Wednesday, and Friday. Takes at bedtime.     potassium chloride (K-DUR,KLOR-CON) 10 MEQ tablet Take 1 tablet by mouth daily.     predniSONE (DELTASONE) 5 MG tablet Take 5 mg by mouth  every morning.      SENSIPAR 60 MG tablet Take 1 tablet by mouth daily. Monday-friday  5   tacrolimus (PROGRAF) 1 MG capsule Take 3 mg by mouth 2 (two) times daily. Takes 83m in the AM and 24min the PM.     No current facility-administered medications for this visit.     OBJECTIVE: Middle-aged African-American woman who appears stated age  Vi58  11/01/18 0851  BP: (!) 154/83  Pulse: 88  Resp: 17  Temp: 98.5 F (36.9 C)  SpO2: 100%     Body mass index is 30.4 kg/m.  Filed Weights   11/01/18 0851  Weight: 182 lb 11.2 oz (82.9 kg)   Filed Weights   11/01/18 0851  Weight: 182 lb 11.2 oz (82.9 kg)   ECOG FS:1 - Symptomatic but completely ambulatory   Sclerae unicteric, EOMs intact Wearing a mask No cervical or supraclavicular adenopathy Lungs no rales or rhonchi Heart regular rate and rhythm Abd soft, nontender, positive bowel sounds MSK no focal spinal tenderness, no upper extremity lymphedema Neuro: nonfocal, well oriented, appropriate affect Breasts: The right breast is unremarkable.  The left breast is status post lumpectomy.  There is no evidence of disease recurrence.  The 2 lesions previously noted in the upper outer quadrant of the left breast have essentially resolved.  They are flat and show atrophic skin.  These are imaged below.  Left breast 11/01/2018      Insect Bite 08/03/2018       LAB RESULTS:  CMP     Component Value Date/Time   NA 143 11/01/2018 0828   NA 142 02/14/2017 1012   K 3.3 (L) 11/01/2018 0828   K 2.8 (LL) 02/14/2017 1012   CL 104 11/01/2018 0828   CO2 27 11/01/2018 0828   CO2 30 (H) 02/14/2017 1012   GLUCOSE 197 (H) 11/01/2018 0828   GLUCOSE 104 02/14/2017 1012   BUN 13 11/01/2018 0828   BUN 14.1 02/14/2017 1012   CREATININE 1.37 (H) 11/01/2018 0828   CREATININE 1.2 (H) 02/14/2017 1012   CALCIUM 10.3 11/01/2018 0828   CALCIUM 9.6 02/14/2017 1012   PROT 7.7 11/01/2018 0828   PROT 7.8 02/14/2017 1012   ALBUMIN 4.3  11/01/2018 0828   ALBUMIN 4.4 02/14/2017 1012   AST 20 11/01/2018 0828   AST 15 02/14/2017 1012   ALT 31 11/01/2018 0828   ALT  15 02/14/2017 1012   ALKPHOS 57 11/01/2018 0828   ALKPHOS 72 02/14/2017 1012   BILITOT 0.4 11/01/2018 0828   BILITOT 0.53 02/14/2017 1012   GFRNONAA 43 (L) 11/01/2018 0828   GFRAA 49 (L) 11/01/2018 0828    No results found for: Ronnald Ramp, A1GS, A2GS, BETS, BETA2SER, GAMS, MSPIKE, SPEI  No results found for: Nils Pyle, [facility]  Lab Results  Component Value Date   WBC 11.1 (H) 11/01/2018   NEUTROABS 7.3 11/01/2018   HGB 12.9 11/01/2018   HCT 40.7 11/01/2018   MCV 86.6 11/01/2018   PLT 216 11/01/2018      Chemistry      Component Value Date/Time   NA 143 11/01/2018 0828   NA 142 02/14/2017 1012   K 3.3 (L) 11/01/2018 0828   K 2.8 (LL) 02/14/2017 1012   CL 104 11/01/2018 0828   CO2 27 11/01/2018 0828   CO2 30 (H) 02/14/2017 1012   BUN 13 11/01/2018 0828   BUN 14.1 02/14/2017 1012   CREATININE 1.37 (H) 11/01/2018 0828   CREATININE 1.2 (H) 02/14/2017 1012      Component Value Date/Time   CALCIUM 10.3 11/01/2018 0828   CALCIUM 9.6 02/14/2017 1012   ALKPHOS 57 11/01/2018 0828   ALKPHOS 72 02/14/2017 1012   AST 20 11/01/2018 0828   AST 15 02/14/2017 1012   ALT 31 11/01/2018 0828   ALT 15 02/14/2017 1012   BILITOT 0.4 11/01/2018 0828   BILITOT 0.53 02/14/2017 1012       No results found for: LABCA2  No components found for: [id number]  No results for input(s): INR in the last 168 hours.  Urinalysis    Component Value Date/Time   COLORURINE YELLOW 11/24/2006 1539   APPEARANCEUR CLEAR 11/24/2006 1539   LABSPEC 1.018 11/24/2006 1539   PHURINE 6.0 11/24/2006 1539   GLUCOSEU NEGATIVE 11/24/2006 1539   HGBUR SMALL (A) 11/24/2006 1539   BILIRUBINUR NEGATIVE 11/24/2006 1539   [place] 11/24/2006 1539   PROTEINUR 100 (A) 11/24/2006 1539   UROBILINOGEN 1.0 11/24/2006 1539   NITRITE NEGATIVE  11/24/2006 1539   LEUKOCYTESUR MODERATE (A) 11/24/2006 1539     STUDIES: Mm Diag Breast Tomo Bilateral  Result Date: 10/19/2018 CLINICAL DATA:  58 year old female presenting for routine annual surveillance status post left breast lumpectomy in 2018. EXAM: DIGITAL DIAGNOSTIC BILATERAL MAMMOGRAM WITH CAD AND TOMO COMPARISON:  Previous exam(s). ACR Breast Density Category c: The breast tissue is heterogeneously dense, which may obscure small masses. FINDINGS: The left breast lumpectomy site is stable. No suspicious calcifications, masses or areas of distortion are seen in the bilateral breasts. Mammographic images were processed with CAD. IMPRESSION: Stable left breast lumpectomy site. No mammographic evidence of malignancy in the bilateral breasts. RECOMMENDATION: Diagnostic mammogram is suggested in 1 year. (Code:[id number]) I have discussed the findings and recommendations with the patient. Results were also provided in writing at the conclusion of the visit. If applicable, a reminder letter will be sent to the patient regarding the next appointment. BI-RADS CATEGORY  2: Benign. Electronically Signed   By: Ammie Ferrier M.D.   On: 10/19/2018 08:36     ELIGIBLE FOR AVAILABLE RESEARCH PROTOCOL: no  ASSESSMENT: 58 y.o. [place], [place] woman status post left breast lower inner quadrant biopsy 05/10/2016 for a clinical  T2 N0, prognostic stage IIB invaside ductal carcinoma, grade 3, weekly estrogen receptor positive, progesterone receptor negative, with no HER-2 amplification, and the MIB-1 at 90%  (a) biopsy of the 0.6 mm satellite  at the 6:30 o'clock radiant in the left breast 06/02/2016 showed invasive ductal carcinoma, grade 3, estrogen receptor 20% positive, with weak staining intensity, progesterone receptor negative with no HER-2 amplification, and an MIB-1 of 70% (identical to larger mass)   (1) neoadjuvant chemotherapy consisting of doxorubicin and cyclophosphamide in dose dense fashion 4 started  06/01/2016, completed 07/12/2016  followed by Paclitaxel weekly 12 started 07/26/2016, completed 10/11/2016  (2) definitive surgery 11/14/2016 found a residual pT1a pN0 area of invasive ductal carcinoma (0.2 mm), with negative margins.  (3) adjuvant radiation 12/20/16-01/17/17 Site/dose:   Left Breast, 50.4 Gy total delivered in 28 fractions   (4) started anastrozole 02/28/2017  (a) left heel DEXA scan 11/24/2004 was normal  (b) renal feels any additional radiation may be detrimental so bone density not updated  (5) hypercalcemia with worsening renal function  (a) denosumab 60 mg and IV fluids given 08/03/2018  (b) bone scan 08/10/2018 shows no evidence of metastatic disease.   PLAN: Erika Freeman is now 2 years out from definitive surgery for her breast cancer with no evidence of disease recurrence.  This is very favorable.  She is tolerating anastrozole well and the plan is to continue that for a minimum of 5 years.  We discussed weight gain issues and she understands she really will need to change her diet if she wants to lose weight.  She will eat smaller portions, she will use knots when she gets very hungry, and she will cut the carbs.  Otherwise she will return to see me again in 1 year.  She knows to call for any other issues that may develop before then.  Fausto Sampedro, [name], MD  11/01/18 9:11 AM Medical Oncology and Hematology [facility] [address] [place], [place] [address] Tel. [phone]    Fax. [phone]   I, Wilburn Mylar, am acting as scribe for Dr. [name]. Paytan Recine.  I, Lurline Del MD, have reviewed the above documentation for accuracy and completeness, and I agree with the above.

## 2018-11-01 ENCOUNTER — Other Ambulatory Visit: Payer: Self-pay

## 2018-11-01 ENCOUNTER — Inpatient Hospital Stay: Payer: Medicare Other | Attending: Oncology

## 2018-11-01 ENCOUNTER — Inpatient Hospital Stay (HOSPITAL_BASED_OUTPATIENT_CLINIC_OR_DEPARTMENT_OTHER): Payer: Medicare Other | Admitting: Oncology

## 2018-11-01 ENCOUNTER — Telehealth: Payer: Self-pay | Admitting: Oncology

## 2018-11-01 VITALS — BP 154/83 | HR 88 | Temp 98.5°F | Resp 17 | Ht 65.0 in | Wt 182.7 lb

## 2018-11-01 DIAGNOSIS — C50312 Malignant neoplasm of lower-inner quadrant of left female breast: Secondary | ICD-10-CM

## 2018-11-01 DIAGNOSIS — Z170 Estrogen receptor positive status [ER+]: Secondary | ICD-10-CM | POA: Diagnosis not present

## 2018-11-01 DIAGNOSIS — Z79811 Long term (current) use of aromatase inhibitors: Secondary | ICD-10-CM | POA: Insufficient documentation

## 2018-11-01 DIAGNOSIS — Z940 Kidney transplant status: Secondary | ICD-10-CM | POA: Diagnosis not present

## 2018-11-01 DIAGNOSIS — N186 End stage renal disease: Secondary | ICD-10-CM | POA: Insufficient documentation

## 2018-11-01 LAB — CBC WITH DIFFERENTIAL/PLATELET
Abs Immature Granulocytes: 0.07 10*3/uL (ref 0.00–0.07)
Basophils Absolute: 0.1 10*3/uL (ref 0.0–0.1)
Basophils Relative: 1 %
Eosinophils Absolute: 0.3 10*3/uL (ref 0.0–0.5)
Eosinophils Relative: 2 %
HCT: 40.7 % (ref 36.0–46.0)
Hemoglobin: 12.9 g/dL (ref 12.0–15.0)
Immature Granulocytes: 1 %
Lymphocytes Relative: 25 %
Lymphs Abs: 2.7 10*3/uL (ref 0.7–4.0)
MCH: 27.4 pg (ref 26.0–34.0)
MCHC: 31.7 g/dL (ref 30.0–36.0)
MCV: 86.6 fL (ref 80.0–100.0)
Monocytes Absolute: 0.7 10*3/uL (ref 0.1–1.0)
Monocytes Relative: 6 %
Neutro Abs: 7.3 10*3/uL (ref 1.7–7.7)
Neutrophils Relative %: 65 %
Platelets: 216 10*3/uL (ref 150–400)
RBC: 4.7 MIL/uL (ref 3.87–5.11)
RDW: 13.4 % (ref 11.5–15.5)
WBC: 11.1 10*3/uL — ABNORMAL HIGH (ref 4.0–10.5)
nRBC: 0 % (ref 0.0–0.2)

## 2018-11-01 LAB — COMPREHENSIVE METABOLIC PANEL
ALT: 31 U/L (ref 0–44)
AST: 20 U/L (ref 15–41)
Albumin: 4.3 g/dL (ref 3.5–5.0)
Alkaline Phosphatase: 57 U/L (ref 38–126)
Anion gap: 12 (ref 5–15)
BUN: 13 mg/dL (ref 6–20)
CO2: 27 mmol/L (ref 22–32)
Calcium: 10.3 mg/dL (ref 8.9–10.3)
Chloride: 104 mmol/L (ref 98–111)
Creatinine, Ser: 1.37 mg/dL — ABNORMAL HIGH (ref 0.44–1.00)
GFR calc Af Amer: 49 mL/min — ABNORMAL LOW (ref >60)
GFR calc non Af Amer: 43 mL/min — ABNORMAL LOW (ref >60)
Glucose, Bld: 197 mg/dL — ABNORMAL HIGH (ref 70–99)
Potassium: 3.3 mmol/L — ABNORMAL LOW (ref 3.5–5.1)
Sodium: 143 mmol/L (ref 135–145)
Total Bilirubin: 0.4 mg/dL (ref 0.3–1.2)
Total Protein: 7.7 g/dL (ref 6.5–8.1)

## 2018-11-01 NOTE — Telephone Encounter (Signed)
I left a message regarding schedule  

## 2018-11-22 DIAGNOSIS — Z170 Estrogen receptor positive status [ER+]: Secondary | ICD-10-CM | POA: Diagnosis not present

## 2018-11-22 DIAGNOSIS — Z4822 Encounter for aftercare following kidney transplant: Secondary | ICD-10-CM | POA: Diagnosis not present

## 2018-11-22 DIAGNOSIS — E876 Hypokalemia: Secondary | ICD-10-CM | POA: Diagnosis not present

## 2018-11-22 DIAGNOSIS — Z9221 Personal history of antineoplastic chemotherapy: Secondary | ICD-10-CM | POA: Diagnosis not present

## 2018-11-22 DIAGNOSIS — K219 Gastro-esophageal reflux disease without esophagitis: Secondary | ICD-10-CM | POA: Diagnosis not present

## 2018-11-22 DIAGNOSIS — I10 Essential (primary) hypertension: Secondary | ICD-10-CM | POA: Diagnosis not present

## 2018-11-22 DIAGNOSIS — Z923 Personal history of irradiation: Secondary | ICD-10-CM | POA: Diagnosis not present

## 2018-11-22 DIAGNOSIS — R252 Cramp and spasm: Secondary | ICD-10-CM | POA: Diagnosis not present

## 2018-11-22 DIAGNOSIS — Z79899 Other long term (current) drug therapy: Secondary | ICD-10-CM | POA: Diagnosis not present

## 2018-11-22 DIAGNOSIS — E138 Other specified diabetes mellitus with unspecified complications: Secondary | ICD-10-CM | POA: Diagnosis not present

## 2018-11-22 DIAGNOSIS — Z23 Encounter for immunization: Secondary | ICD-10-CM | POA: Diagnosis not present

## 2018-11-22 DIAGNOSIS — D899 Disorder involving the immune mechanism, unspecified: Secondary | ICD-10-CM | POA: Diagnosis not present

## 2018-11-22 DIAGNOSIS — Z79811 Long term (current) use of aromatase inhibitors: Secondary | ICD-10-CM | POA: Diagnosis not present

## 2018-11-22 DIAGNOSIS — Z940 Kidney transplant status: Secondary | ICD-10-CM | POA: Diagnosis not present

## 2018-11-22 DIAGNOSIS — C50919 Malignant neoplasm of unspecified site of unspecified female breast: Secondary | ICD-10-CM | POA: Diagnosis not present

## 2018-11-22 DIAGNOSIS — Z7952 Long term (current) use of systemic steroids: Secondary | ICD-10-CM | POA: Diagnosis not present

## 2019-01-08 DIAGNOSIS — R5383 Other fatigue: Secondary | ICD-10-CM | POA: Diagnosis not present

## 2019-01-08 DIAGNOSIS — N179 Acute kidney failure, unspecified: Secondary | ICD-10-CM | POA: Diagnosis not present

## 2019-01-08 DIAGNOSIS — N186 End stage renal disease: Secondary | ICD-10-CM | POA: Diagnosis not present

## 2019-01-08 DIAGNOSIS — Z940 Kidney transplant status: Secondary | ICD-10-CM | POA: Diagnosis not present

## 2019-01-08 DIAGNOSIS — D249 Benign neoplasm of unspecified breast: Secondary | ICD-10-CM | POA: Diagnosis not present

## 2019-01-08 DIAGNOSIS — T8619 Other complication of kidney transplant: Secondary | ICD-10-CM | POA: Diagnosis not present

## 2019-01-08 DIAGNOSIS — E1122 Type 2 diabetes mellitus with diabetic chronic kidney disease: Secondary | ICD-10-CM | POA: Diagnosis not present

## 2019-01-08 DIAGNOSIS — D259 Leiomyoma of uterus, unspecified: Secondary | ICD-10-CM | POA: Diagnosis not present

## 2019-01-09 ENCOUNTER — Other Ambulatory Visit: Payer: Self-pay | Admitting: Oncology

## 2019-01-09 ENCOUNTER — Telehealth: Payer: Self-pay | Admitting: *Deleted

## 2019-01-09 DIAGNOSIS — Z940 Kidney transplant status: Secondary | ICD-10-CM | POA: Diagnosis not present

## 2019-01-09 DIAGNOSIS — R5383 Other fatigue: Secondary | ICD-10-CM | POA: Diagnosis not present

## 2019-01-09 DIAGNOSIS — Z79899 Other long term (current) drug therapy: Secondary | ICD-10-CM | POA: Diagnosis not present

## 2019-01-09 DIAGNOSIS — N179 Acute kidney failure, unspecified: Secondary | ICD-10-CM | POA: Diagnosis not present

## 2019-01-09 NOTE — Telephone Encounter (Signed)
This RN received communication per fax of call pm yesterday from pt's husband stating pt had abnormal lab called to her of calcium of 13.3 with recommendation to proceed to the ER for further evaluation and interventions. Pt declined proceeding to the ER and husband is concerned.  [facility] advised for pt to contact this office this AM for further recommendations.  Per chart review - pt did not have lab drawn at this office.   This RN spoke with pt at 945 am inquiring where she had lab drawn- which was at [facility] and ordered by her nephrologist at [facility].  She is hoping " I could come to your office instead of going to [facility] "  This RN was able to verify lab per Care Everywhere and advised pt to contact their office per need for ordering MD to follow up for best outcome- this RN reiterated concern of calcium level with pt's status of post transplant with one functioning kidney.  Pt verbalized understanding of above recommendation.

## 2019-01-10 DIAGNOSIS — D259 Leiomyoma of uterus, unspecified: Secondary | ICD-10-CM | POA: Diagnosis present

## 2019-01-10 DIAGNOSIS — Z79899 Other long term (current) drug therapy: Secondary | ICD-10-CM | POA: Diagnosis not present

## 2019-01-10 DIAGNOSIS — N179 Acute kidney failure, unspecified: Secondary | ICD-10-CM | POA: Diagnosis not present

## 2019-01-10 DIAGNOSIS — Z4822 Encounter for aftercare following kidney transplant: Secondary | ICD-10-CM | POA: Diagnosis not present

## 2019-01-10 DIAGNOSIS — R5383 Other fatigue: Secondary | ICD-10-CM | POA: Diagnosis not present

## 2019-01-10 DIAGNOSIS — N186 End stage renal disease: Secondary | ICD-10-CM | POA: Diagnosis present

## 2019-01-10 DIAGNOSIS — Z853 Personal history of malignant neoplasm of breast: Secondary | ICD-10-CM | POA: Diagnosis not present

## 2019-01-10 DIAGNOSIS — D351 Benign neoplasm of parathyroid gland: Secondary | ICD-10-CM | POA: Diagnosis present

## 2019-01-10 DIAGNOSIS — E210 Primary hyperparathyroidism: Secondary | ICD-10-CM | POA: Diagnosis present

## 2019-01-10 DIAGNOSIS — T8619 Other complication of kidney transplant: Secondary | ICD-10-CM | POA: Diagnosis present

## 2019-01-10 DIAGNOSIS — E876 Hypokalemia: Secondary | ICD-10-CM | POA: Diagnosis not present

## 2019-01-10 DIAGNOSIS — D249 Benign neoplasm of unspecified breast: Secondary | ICD-10-CM | POA: Diagnosis present

## 2019-01-10 DIAGNOSIS — E1122 Type 2 diabetes mellitus with diabetic chronic kidney disease: Secondary | ICD-10-CM | POA: Diagnosis present

## 2019-01-10 DIAGNOSIS — Z940 Kidney transplant status: Secondary | ICD-10-CM | POA: Diagnosis not present

## 2019-01-10 MED ORDER — PHENYLEPH-POT GUAIACOLSULF
81.00 | Status: DC
Start: 2019-01-10 — End: 2019-01-10

## 2019-01-10 MED ORDER — Medication
6.00 | Status: DC
Start: 2019-01-10 — End: 2019-01-10

## 2019-01-10 MED ORDER — ATORVASTATIN CALCIUM 10 MG PO TABS
10.00 | ORAL_TABLET | ORAL | Status: DC
Start: 2019-01-11 — End: 2019-01-10

## 2019-01-10 MED ORDER — PHENYLEPHRINE-GUAIFENESIN 30-400 MG PO CP12
10.00 | ORAL_CAPSULE | ORAL | Status: DC
Start: 2019-01-11 — End: 2019-01-10

## 2019-01-10 MED ORDER — ALBA-3 EX
400.00 | CUTANEOUS | Status: DC
Start: 2019-01-10 — End: 2019-01-10

## 2019-01-10 MED ORDER — SODIUM CHLORIDE 0.9 % IV SOLN
INTRAVENOUS | Status: DC
Start: ? — End: 2019-01-10

## 2019-01-10 MED ORDER — CINACALCET HCL 30 MG PO TABS
60.00 | ORAL_TABLET | ORAL | Status: DC
Start: 2019-01-11 — End: 2019-01-10

## 2019-01-10 MED ORDER — LOMA ARTHRITIS PO LIQD
5.00 | ORAL | Status: DC
Start: 2019-01-11 — End: 2019-01-10

## 2019-01-10 MED ORDER — PNEUMOCOCCAL VAC POLYVALENT 25 MCG/0.5ML IJ INJ
0.50 | INJECTION | INTRAMUSCULAR | Status: DC
Start: ? — End: 2019-01-10

## 2019-01-10 MED ORDER — POTASSIUM CHLORIDE CRYS ER 20 MEQ PO TBCR
40.00 | EXTENDED_RELEASE_TABLET | ORAL | Status: DC
Start: 2019-01-10 — End: 2019-01-10

## 2019-01-10 MED ORDER — BELLADONNA ALKALOIDS-OPIUM RE
10.00 | RECTAL | Status: DC
Start: 2019-01-11 — End: 2019-01-10

## 2019-01-10 MED ORDER — COPPERTONE SPF8 EX
10.00 | CUTANEOUS | Status: DC
Start: 2019-01-10 — End: 2019-01-10

## 2019-01-10 MED ORDER — CELLULOSE SODIUM PHOSPHATE VI
5000.00 | Status: DC
Start: 2019-01-10 — End: 2019-01-10

## 2019-01-10 MED ORDER — G-FEN DM 20-400 MG PO TABS
1.00 | ORAL_TABLET | ORAL | Status: DC
Start: 2019-01-11 — End: 2019-01-10

## 2019-01-13 ENCOUNTER — Other Ambulatory Visit: Payer: Self-pay | Admitting: Oncology

## 2019-01-16 DIAGNOSIS — Z9221 Personal history of antineoplastic chemotherapy: Secondary | ICD-10-CM | POA: Diagnosis not present

## 2019-01-16 DIAGNOSIS — Z4822 Encounter for aftercare following kidney transplant: Secondary | ICD-10-CM | POA: Diagnosis not present

## 2019-01-16 DIAGNOSIS — E138 Other specified diabetes mellitus with unspecified complications: Secondary | ICD-10-CM | POA: Diagnosis not present

## 2019-01-16 DIAGNOSIS — Z79899 Other long term (current) drug therapy: Secondary | ICD-10-CM | POA: Diagnosis not present

## 2019-01-16 DIAGNOSIS — Z792 Long term (current) use of antibiotics: Secondary | ICD-10-CM | POA: Diagnosis not present

## 2019-01-16 DIAGNOSIS — K219 Gastro-esophageal reflux disease without esophagitis: Secondary | ICD-10-CM | POA: Diagnosis not present

## 2019-01-16 DIAGNOSIS — Z853 Personal history of malignant neoplasm of breast: Secondary | ICD-10-CM | POA: Diagnosis not present

## 2019-01-16 DIAGNOSIS — E876 Hypokalemia: Secondary | ICD-10-CM | POA: Diagnosis not present

## 2019-01-16 DIAGNOSIS — E212 Other hyperparathyroidism: Secondary | ICD-10-CM | POA: Diagnosis not present

## 2019-01-16 DIAGNOSIS — Z7952 Long term (current) use of systemic steroids: Secondary | ICD-10-CM | POA: Diagnosis not present

## 2019-01-16 DIAGNOSIS — Z940 Kidney transplant status: Secondary | ICD-10-CM | POA: Diagnosis not present

## 2019-01-16 DIAGNOSIS — R7303 Prediabetes: Secondary | ICD-10-CM | POA: Diagnosis not present

## 2019-01-16 DIAGNOSIS — Z5181 Encounter for therapeutic drug level monitoring: Secondary | ICD-10-CM | POA: Diagnosis not present

## 2019-01-16 DIAGNOSIS — E559 Vitamin D deficiency, unspecified: Secondary | ICD-10-CM | POA: Diagnosis not present

## 2019-01-16 DIAGNOSIS — I10 Essential (primary) hypertension: Secondary | ICD-10-CM | POA: Diagnosis not present

## 2019-01-16 DIAGNOSIS — D8989 Other specified disorders involving the immune mechanism, not elsewhere classified: Secondary | ICD-10-CM | POA: Diagnosis not present

## 2019-01-17 ENCOUNTER — Telehealth: Payer: Self-pay | Admitting: Oncology

## 2019-01-17 NOTE — Telephone Encounter (Signed)
Scheduled appt per 11/17 sch message - pt is aware of apt date and time on 11/20

## 2019-01-17 NOTE — Progress Notes (Signed)
[place]  [phone] [phone]     ID: Erika Freeman DOB: [birth year]  MR#: [id number]  OEU#:[id number]  Patient Care Team: Patient, No Pcp Per as PCP - General (General Practice) [name], MD as Consulting Physician (Nephrology) [name], MD as Consulting Physician (Obstetrics and Gynecology) [name], MD as Consulting Physician (General Surgery) [name], [name], MD as Consulting Physician (Oncology) [name], MD as Consulting Physician (Radiation Oncology) [name], MD as Referring Physician (Surgery) [name], DO (Internal Medicine) OTHER MD:  CHIEF COMPLAINT: Estrogen receptor positive breast cancer  CURRENT TREATMENT: anastrozole    INTERVAL HISTORY:  Erika Freeman resturns today for follow-up of her estrogen receptor positive breast cancer accompanied by her husband Dwight  10 days ago she called to report that she had been found to have high calcium.  She presented to the emergency room at [facility], was admitted, hydrated, and her vitamin D was stopped.  Her Sensipar was increased although she has not yet received the extra 30 mg that she needs to make 90 mg.  She was seen there again 1118 and found to have an elevated calcium at 11.3.  She was sent here for consideration of IV fluids.  As part of her work-up a PTH was checked that it was elevated.  She is being considered for parathyroidectomy.  From a breast cancer point of view she continues on anastrozole.  She has occasional hot flashes and moderate problems with vaginal dryness.  She is not interested in our pelvic rehab program.   REVIEW OF SYSTEMS: Erika Freeman looks fine and feels fine.  She tells me the whole problem started because Erika Freeman had appendicitis and she was taking care of him for a week and not really taking care of herself.  She wonders if there is a Psychologist, sport and exercise in town who does parathyroidectomies so she would not have to go  to [facility] for that.  Currently she denies any unusual headaches visual changes cough phlegm production pleurisy or change in bowel or bladder habits.  A detailed review of systems was stable.  BREAST CANCER HISTORY: From the original intake note:  "Erika Freeman" herself noted a mass in her left breast. She ignored it because she thought it was related to menstruation. However as it persisted through 1. She brought it to Dr. [name] attention and on 05/10/2016 she underwent left diagnostic mammography with tomography and [facility] The breast density was category C. In the left breast lower central area there was a new circumscribed mass. This was firm and fixed in the lower inner left breast at middle depth. Ultrasonography confirmed a 2.6 cm hypoechoic irregular mass at the 6:30 o'clock radiant 3 cm from the nipple. There was also a 0.5 cm nodule 1.4 cm medial to the mass just described.  Biopsy of the left breast mass in question 05/10/2016 showed([id number]) invasive ductal carcinoma, grade 3, estrogen receptor 70% positive with weak staining intensity, progesterone receptor negative, with no HER-2 amplification, the signals ratio being 1.52 and the number per cell 2.35. The proliferation marker was 90%.  Her subsequent history is as detailed below  OF NOTE: The patient has a history of focal segmental glomerular sclerosis with end-stage renal disease and is status post unrelated donor renal transplant 04/15/2013 as part of appeared donor exchange with her husband donating one kidney, the other harvested from a 57 year old [place] man. She continues on immunosuppression with mycophenolate and tacrolimus.  PAST MEDICAL HISTORY: Past Medical History:  Diagnosis Date  . Anemia   . Breast cancer ([place])   . Cancer [facility])    breast cancer left side  . Chills   . Chronic kidney disease    resolved kidney transplant - [facility] - transplant -2015  . Cough   .  Fibroid tumor   . GERD (gastroesophageal reflux disease)    resolved   . Heart murmur   . History of radiation therapy 12/20/16-01/17/17   left breast 50.4 gy in 28 fractions  . Hypertension    DX  30 YRS AGO  . Personal history of chemotherapy   . Personal history of radiation therapy   . Umbilical hernia s/p primary repair 01/10/2012 01/24/2012  . Vomiting     PAST SURGICAL HISTORY: Past Surgical History:  Procedure Laterality Date  . BREAST BIOPSY    . BREAST LUMPECTOMY Left    2018  . BREAST LUMPECTOMY WITH RADIOACTIVE SEED AND SENTINEL LYMPH NODE BIOPSY Left 11/14/2016   Procedure: LEFT BREAST RADIOACTIVE SEED BRACKETED LUMPECTOMY, LEFT AXILLARY SENTINEL LYMPH NODE BIOPSY WITH BLUE DYE INJECTION;  Surgeon: [name], MD;  Location: [facility];  Service: General;  Laterality: Left;  . BREAST SURGERY  2012   left - fibroadenoma  . CAPD INSERTION  01/10/2012   Procedure: LAPAROSCOPIC INSERTION CONTINUOUS AMBULATORY PERITONEAL DIALYSIS  (CAPD) CATHETER;  Surgeon: Adin Hector, MD;  Location: [place];  Service: General;  Laterality: N/A;  LAPAROSCOPIC CAPD CATHETER PLACEMENT OMENTOPEXY  . CESAREAN SECTION  1996  . COLONOSCOPY    . DILATION AND CURETTAGE OF UTERUS    . KIDNEY TRANSPLANT    . PARATHYROIDECTOMY  2000 - approximate  . PORT-A-CATH REMOVAL Right 11/14/2016   Procedure: REMOVAL PORT-A-CATH;  Surgeon: [name], MD;  Location: [place];  Service: General;  Laterality: Right;  . PORTACATH PLACEMENT Right 05/24/2016   Procedure: INSERTION PORT-A-CATH WITH [place];  Surgeon: [name], MD;  Location: [place];  Service: General;  Laterality: Right;  . RENAL BIOPSY  2011  . TUBAL LIGATION    . UMBILICAL HERNIA REPAIR  01/10/2012   Procedure: HERNIA REPAIR UMBILICAL ADULT;  Surgeon: Adin Hector, MD;  Location: [place];  Service: General;  Laterality: N/A;    FAMILY HISTORY Family History  Problem Relation Age of Onset  . Cancer Maternal Uncle        lung   . Cancer Cousin        breast  . Cancer Cousin        colon  . Colon cancer Neg Hx   . Colon polyps Neg Hx   . Esophageal cancer Neg Hx   . Stomach cancer Neg Hx   . Rectal cancer Neg Hx   The patient has very little information regarding her father. Her mother is living, 60 years old as of March 2018. The patient had one full brother, diagnosed with prostate cancer in his 31s. The patient has 2 half-brothers and 1 half-sister. There is no history of breast or ovarian cancer in the family to the patient's knowledge    GYNECOLOGIC HISTORY:  Patient's last menstrual period was 04/27/2016 (exact date).  Menarche age 49, first live birth age 21, the patient is GX P3. Her periods are regular, the last 5 or 6 days, with no heavy days. 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 Her husband white works at home  [employer]. Son [name] lives in [place] and as Freight forwarder of a Therapist, art. Son [name] lives in [place] and is a Air traffic controller. Son [name] lives in [place] and is studying based trombone. The patient has 2 grandchildren. She attends a local [employer]  not in place    HEALTH MAINTENANCE: Social History   Tobacco Use  . Smoking status: Never Smoker  . Smokeless tobacco: Never Used  Substance Use Topics  . Alcohol use: No  . Drug use: No     Colonoscopy:  PAP:  Bone density:   No Known Allergies  Current Outpatient Medications  Medication Sig Dispense Refill  . acetaminophen (TYLENOL) 500 MG tablet Take 1,000 mg by mouth every 6 (six) hours as needed.    [name] amLODipine (NORVASC) 10 MG tablet Take 10 mg by mouth daily.    [name] anastrozole (ARIMIDEX) 1 MG tablet Take 1 tablet (1 mg total) by mouth daily. 90 tablet 4  . aspirin 81 MG tablet Take 81 mg by mouth every morning.     . cholecalciferol (VITAMIN D) 1000  units tablet Take 1 tablet (1,000 Units total) by mouth daily. 90 tablet 4  . cinacalcet (SENSIPAR) 30 MG tablet Take one tablet daily together with a 60 mg tablet to make 90 mg daily 30 tablet 0  . diphenhydrAMINE (BENADRYL) 25 MG tablet Take 25 mg by mouth daily as needed for allergies.    [name] lovastatin (MEVACOR) 20 MG tablet Take 20 mg by mouth every Monday, Wednesday, and Friday. Takes at bedtime.    . potassium chloride (K-DUR,KLOR-CON) 10 MEQ tablet Take 1 tablet by mouth daily.    . predniSONE (DELTASONE) 5 MG tablet Take 5 mg by mouth every morning.     . SENSIPAR 60 MG tablet Take 1 tablet by mouth daily. Monday-friday  5  . tacrolimus (PROGRAF) 1 MG capsule Take 3 mg by mouth 2 (two) times daily. Takes 66m in the AM and 236min the PM.     No current facility-administered medications for this visit.     OBJECTIVE: Middle-aged African-American woman in no acute distress  Vitals:   01/18/19 1252  BP: (!) 139/94  Pulse: 82  Resp: 18  Temp: 98.5 F (36.9 C)  SpO2: 99%     Body mass index is 29.54 kg/m.  Filed Weights   01/18/19 1252  Weight: 177 lb 8 oz (80.5 kg)   Filed Weights   01/18/19 1252  Weight: 177 lb 8 oz (80.5 kg)   ECOG FS:1 - Symptomatic but completely ambulatory   Sclerae unicteric, EOMs intact Wearing a mask No cervical or supraclavicular adenopathy Lungs no rales or rhonchi Heart regular rate and rhythm Abd soft, nontender, positive bowel sounds MSK no focal spinal tenderness, no upper extremity lymphedema Neuro: nonfocal, well oriented, appropriate affect Breasts: Deferred   Left breast 11/01/2018      Insect Bite 08/03/2018       LAB RESULTS:  CMP     Component Value Date/Time   NA 143 11/01/2018 0828   NA 142 02/14/2017 1012   K 3.3 (L) 11/01/2018 0828   K 2.8 (LL) 02/14/2017 1012   CL 104 11/01/2018 0828   CO2 27 11/01/2018 0828   CO2 30 (H) 02/14/2017 1012   GLUCOSE 197 (H) 11/01/2018 0828   GLUCOSE 104 02/14/2017 1012    BUN 13 11/01/2018 0828   BUN 14.1 02/14/2017 1012  CREATININE 1.37 (H) 11/01/2018 0828   CREATININE 1.2 (H) 02/14/2017 1012   CALCIUM 10.3 11/01/2018 0828   CALCIUM 9.6 02/14/2017 1012   PROT 7.7 11/01/2018 0828   PROT 7.8 02/14/2017 1012   ALBUMIN 4.3 11/01/2018 0828   ALBUMIN 4.4 02/14/2017 1012   AST 20 11/01/2018 0828   AST 15 02/14/2017 1012   ALT 31 11/01/2018 0828   ALT 15 02/14/2017 1012   ALKPHOS 57 11/01/2018 0828   ALKPHOS 72 02/14/2017 1012   BILITOT 0.4 11/01/2018 0828   BILITOT 0.53 02/14/2017 1012   GFRNONAA 43 (L) 11/01/2018 0828   GFRAA 49 (L) 11/01/2018 0828    No results found for: TOTALPROTELP, ALBUMINELP, A1GS, A2GS, BETS, BETA2SER, GAMS, MSPIKE, SPEI  No results found for: Nils Pyle, [facility]  Lab Results  Component Value Date   WBC 14.1 (H) 01/18/2019   NEUTROABS 10.9 (H) 01/18/2019   HGB 12.1 01/18/2019   HCT 37.7 01/18/2019   MCV 86.1 01/18/2019   PLT 238 01/18/2019      Chemistry      Component Value Date/Time   NA 143 11/01/2018 0828   NA 142 02/14/2017 1012   K 3.3 (L) 11/01/2018 0828   K 2.8 (LL) 02/14/2017 1012   CL 104 11/01/2018 0828   CO2 27 11/01/2018 0828   CO2 30 (H) 02/14/2017 1012   BUN 13 11/01/2018 0828   BUN 14.1 02/14/2017 1012   CREATININE 1.37 (H) 11/01/2018 0828   CREATININE 1.2 (H) 02/14/2017 1012      Component Value Date/Time   CALCIUM 10.3 11/01/2018 0828   CALCIUM 9.6 02/14/2017 1012   ALKPHOS 57 11/01/2018 0828   ALKPHOS 72 02/14/2017 1012   AST 20 11/01/2018 0828   AST 15 02/14/2017 1012   ALT 31 11/01/2018 0828   ALT 15 02/14/2017 1012   BILITOT 0.4 11/01/2018 0828   BILITOT 0.53 02/14/2017 1012       No results found for: LABCA2  No components found for: [id number]  No results for input(s): INR in the last 168 hours.  Urinalysis    Component Value Date/Time   COLORURINE YELLOW 11/24/2006 1539   APPEARANCEUR CLEAR 11/24/2006 1539   LABSPEC 1.018 11/24/2006 1539    PHURINE 6.0 11/24/2006 1539   GLUCOSEU NEGATIVE 11/24/2006 1539   HGBUR SMALL (A) 11/24/2006 1539   BILIRUBINUR NEGATIVE 11/24/2006 1539   KETONESUR NEGATIVE 11/24/2006 1539   PROTEINUR 100 (A) 11/24/2006 1539   UROBILINOGEN 1.0 11/24/2006 1539   NITRITE NEGATIVE 11/24/2006 1539   LEUKOCYTESUR MODERATE (A) 11/24/2006 1539     STUDIES: No results found.   ELIGIBLE FOR AVAILABLE RESEARCH PROTOCOL: no  ASSESSMENT: 58 y.o. [place], [place] woman status post left breast lower inner quadrant biopsy 05/10/2016 for a clinical  T2 N0, prognostic stage IIB invaside ductal carcinoma, grade 3, weekly estrogen receptor positive, progesterone receptor negative, with no HER-2 amplification, and the MIB-1 at 90%  (a) biopsy of the 0.6 mm satellite at the 6:30 o'clock radiant in the left breast 06/02/2016 showed invasive ductal carcinoma, grade 3, estrogen receptor 20% positive, with weak staining intensity, progesterone receptor negative with no HER-2 amplification, and an MIB-1 of 70% (identical to larger mass)   (1) neoadjuvant chemotherapy consisting of doxorubicin and cyclophosphamide in dose dense fashion 4 started 06/01/2016, completed 07/12/2016  followed by Paclitaxel weekly 12 started 07/26/2016, completed 10/11/2016  (2) definitive surgery 11/14/2016 found a residual pT1a pN0 area of invasive ductal carcinoma (0.2 mm), with negative margins.  (3) adjuvant  radiation 12/20/16-01/17/17 Site/dose:   Left Breast, 50.4 Gy total delivered in 28 fractions   (4) started anastrozole 02/28/2017  (a) left heel DEXA scan 11/24/2004 was normal  (b) renal feels any additional radiation may be detrimental so bone density not updated  (5) hypercalcemia with worsening renal function  (a) denosumab 60 mg and IV fluids given 08/03/2018  (b) bone scan 08/10/2018 shows no evidence of metastatic disease.   PLAN: Erika Freeman is now a little more than 2 years out from definitive surgery for her breast cancer with  no evidence of disease recurrence.  This is very favorable.  She is tolerating anastrozole well and the plan is to continue that a minimum of 5 years  She has had repeated problems with hypercalcemia and at this point parathyroidectomy is being considered.  I have a call out to Dr. Sherren Mocha jerking to see if there is any way he could possibly do this in the near future.  Otherwise she will end up having it at [facility] which is of course fine from a surgical point of view but less convenient for her.  Her calcium today is 11.2.  In addition to fluids I am going to give her 60 mg of pamidronate.  I am hopeful this will control the problem for the next couple of weeks or so until she has her surgery.  I do not find any interactions between pamidronate and her Sensipar.  I am having her calcium repeated on January 28, 2019.  She knows to call for any other issues that may develop before the next visit.   Tatiyanna Lashley, [name], MD  01/18/19 1:53 PM Medical Oncology and Hematology [facility] [place], [place] [address] Tel. [phone]    Fax. [phone]   I, Wilburn Mylar, am acting as scribe for Dr. [name]. Galilee Pierron.  I, Lurline Del MD, have reviewed the above documentation for accuracy and completeness, and I agree with the above.

## 2019-01-18 ENCOUNTER — Inpatient Hospital Stay: Payer: Medicare Other | Attending: Oncology

## 2019-01-18 ENCOUNTER — Inpatient Hospital Stay: Payer: Medicare Other

## 2019-01-18 ENCOUNTER — Other Ambulatory Visit: Payer: Self-pay | Admitting: Oncology

## 2019-01-18 ENCOUNTER — Other Ambulatory Visit: Payer: Self-pay

## 2019-01-18 ENCOUNTER — Inpatient Hospital Stay (HOSPITAL_BASED_OUTPATIENT_CLINIC_OR_DEPARTMENT_OTHER): Payer: Medicare Other | Admitting: Oncology

## 2019-01-18 VITALS — BP 139/94 | HR 82 | Temp 98.5°F | Resp 18 | Ht 65.0 in | Wt 177.5 lb

## 2019-01-18 DIAGNOSIS — C50312 Malignant neoplasm of lower-inner quadrant of left female breast: Secondary | ICD-10-CM

## 2019-01-18 DIAGNOSIS — Z170 Estrogen receptor positive status [ER+]: Secondary | ICD-10-CM | POA: Insufficient documentation

## 2019-01-18 DIAGNOSIS — N185 Chronic kidney disease, stage 5: Secondary | ICD-10-CM

## 2019-01-18 DIAGNOSIS — C50919 Malignant neoplasm of unspecified site of unspecified female breast: Secondary | ICD-10-CM

## 2019-01-18 LAB — CBC WITH DIFFERENTIAL/PLATELET
Abs Immature Granulocytes: 0.08 10*3/uL — ABNORMAL HIGH (ref 0.00–0.07)
Basophils Absolute: 0 10*3/uL (ref 0.0–0.1)
Basophils Relative: 0 %
Eosinophils Absolute: 0.1 10*3/uL (ref 0.0–0.5)
Eosinophils Relative: 1 %
HCT: 37.7 % (ref 36.0–46.0)
Hemoglobin: 12.1 g/dL (ref 12.0–15.0)
Immature Granulocytes: 1 %
Lymphocytes Relative: 16 %
Lymphs Abs: 2.3 10*3/uL (ref 0.7–4.0)
MCH: 27.6 pg (ref 26.0–34.0)
MCHC: 32.1 g/dL (ref 30.0–36.0)
MCV: 86.1 fL (ref 80.0–100.0)
Monocytes Absolute: 0.7 10*3/uL (ref 0.1–1.0)
Monocytes Relative: 5 %
Neutro Abs: 10.9 10*3/uL — ABNORMAL HIGH (ref 1.7–7.7)
Neutrophils Relative %: 77 %
Platelets: 238 10*3/uL (ref 150–400)
RBC: 4.38 MIL/uL (ref 3.87–5.11)
RDW: 13.3 % (ref 11.5–15.5)
WBC: 14.1 10*3/uL — ABNORMAL HIGH (ref 4.0–10.5)
nRBC: 0 % (ref 0.0–0.2)

## 2019-01-18 LAB — COMPREHENSIVE METABOLIC PANEL
ALT: 33 U/L (ref 0–44)
AST: 22 U/L (ref 15–41)
Albumin: 4.4 g/dL (ref 3.5–5.0)
Alkaline Phosphatase: 68 U/L (ref 38–126)
Anion gap: 11 (ref 5–15)
BUN: 19 mg/dL (ref 6–20)
CO2: 27 mmol/L (ref 22–32)
Calcium: 11.2 mg/dL — ABNORMAL HIGH (ref 8.9–10.3)
Chloride: 103 mmol/L (ref 98–111)
Creatinine, Ser: 1.71 mg/dL — ABNORMAL HIGH (ref 0.44–1.00)
GFR calc Af Amer: 38 mL/min — ABNORMAL LOW (ref >60)
GFR calc non Af Amer: 33 mL/min — ABNORMAL LOW (ref >60)
Glucose, Bld: 126 mg/dL — ABNORMAL HIGH (ref 70–99)
Potassium: 3.5 mmol/L (ref 3.5–5.1)
Sodium: 141 mmol/L (ref 135–145)
Total Bilirubin: 0.4 mg/dL (ref 0.3–1.2)
Total Protein: 8 g/dL (ref 6.5–8.1)

## 2019-01-18 MED ORDER — SODIUM CHLORIDE 0.9 % IV SOLN
INTRAVENOUS | Status: DC
  Administered 2019-01-18: 14:00:00 via INTRAVENOUS
  Filled 2019-01-18 (×2): qty 250

## 2019-01-18 MED ORDER — SODIUM CHLORIDE 0.9 % IV SOLN
60.0000 mg | Freq: Once | INTRAVENOUS | Status: AC
  Administered 2019-01-18: 60 mg via INTRAVENOUS
  Filled 2019-01-18: qty 20

## 2019-01-18 MED ORDER — CINACALCET HCL 30 MG PO TABS: ORAL_TABLET | ORAL | 0 refills | Status: DC

## 2019-01-18 NOTE — Patient Instructions (Signed)
Dehydration, Adult  Dehydration is when there is not enough fluid or water in your body. This happens when you lose more fluids than you take in. Dehydration can range from mild to very bad. It should be treated right away to keep it from getting very bad. Symptoms of mild dehydration may include:  Thirst.  Dry lips.  Slightly dry mouth.  Dry, warm skin.  Dizziness. Symptoms of moderate dehydration may include:  Very dry mouth.  Muscle cramps.  Dark pee (urine). Pee may be the color of tea.  Your body making less pee.  Your eyes making fewer tears.  Heartbeat that is uneven or faster than normal (palpitations).  Headache.  Light-headedness, especially when you stand up from sitting.  Fainting (syncope). Symptoms of very bad dehydration may include:  Changes in skin, such as: ? Cold and clammy skin. ? Blotchy (mottled) or pale skin. ? Skin that does not quickly return to normal after being lightly pinched and let go (poor skin turgor).  Changes in body fluids, such as: ? Feeling very thirsty. ? Your eyes making fewer tears. ? Not sweating when body temperature is high, such as in hot weather. ? Your body making very little pee.  Changes in vital signs, such as: ? Weak pulse. ? Pulse that is more than 100 beats a minute when you are sitting still. ? Fast breathing. ? Low blood pressure.  Other changes, such as: ? Sunken eyes. ? Cold hands and feet. ? Confusion. ? Lack of energy (lethargy). ? Trouble waking up from sleep. ? Short-term weight loss. ? Unconsciousness. Follow these instructions at home:   If told by your doctor, drink an ORS: ? Make an ORS by using instructions on the package. ? Start by drinking small amounts, about  cup (120 mL) every 5-10 minutes. ? Slowly drink more until you have had the amount that your doctor said to have.  Drink enough clear fluid to keep your pee clear or pale yellow. If you were told to drink an ORS, finish the  ORS first, then start slowly drinking clear fluids. Drink fluids such as: ? Water. Do not drink only water by itself. Doing that can make the salt (sodium) level in your body get too low (hyponatremia). ? Ice chips. ? Fruit juice that you have added water to (diluted). ? Low-calorie sports drinks.  Avoid: ? Alcohol. ? Drinks that have a lot of sugar. These include high-calorie sports drinks, fruit juice that does not have water added, and soda. ? Caffeine. ? Foods that are greasy or have a lot of fat or sugar.  Take over-the-counter and prescription medicines only as told by your doctor.  Do not take salt tablets. Doing that can make the salt level in your body get too high (hypernatremia).  Eat foods that have minerals (electrolytes). Examples include bananas, oranges, potatoes, tomatoes, and spinach.  Keep all follow-up visits as told by your doctor. This is important. Contact a doctor if:  You have belly (abdominal) pain that: ? Gets worse. ? Stays in one area (localizes).  You have a rash.  You have a stiff neck.  You get angry or annoyed more easily than normal (irritability).  You are more sleepy than normal.  You have a harder time waking up than normal.  You feel: ? Weak. ? Dizzy. ? Very thirsty.  You have peed (urinated) only a small amount of very dark pee during 6-8 hours. Get help right away if:  You have  symptoms of very bad dehydration.  You cannot drink fluids without throwing up (vomiting).  Your symptoms get worse with treatment.  You have a fever.  You have a very bad headache.  You are throwing up or having watery poop (diarrhea) and it: ? Gets worse. ? Does not go away.  You have blood or something green (bile) in your throw-up.  You have blood in your poop (stool). This may cause poop to look black and tarry.  You have not peed in 6-8 hours.  You pass out (faint).  Your heart rate when you are sitting still is more than 100 beats a  minute.  You have trouble breathing. This information is not intended to replace advice given to you by your health care provider. Make sure you discuss any questions you have with your health care provider. Document Released: 12/11/2008 Document Revised: 01/27/2017 Document Reviewed: 04/10/2015 Elsevier Patient Education  [place].   Pamidronate injection What is this medicine? PAMIDRONATE (pa mi DROE nate) slows calcium loss from bones. It is used to treat high calcium blood levels from cancer or Paget's disease. It is also used to treat bone pain and prevent fractures from certain cancers that have spread to the bone. This medicine may be used for other purposes; ask your health care provider or pharmacist if you have questions. COMMON BRAND NAME(S): Aredia What should I tell my health care provider before I take this medicine? They need to know if you have any of these conditions:  aspirin-sensitive asthma  dental disease  kidney disease  an unusual or allergic reaction to pamidronate, other medicines, foods, dyes, or preservatives  pregnant or trying to get pregnant  breast-feeding How should I use this medicine? This medicine is for infusion into a vein. It is given by a health care professional in a hospital or clinic setting. Talk to your pediatrician regarding the use of this medicine in children. This medicine is not approved for use in children. Overdosage: If you think you have taken too much of this medicine contact a poison control center or emergency room at once. NOTE: This medicine is only for you. Do not share this medicine with others. What if I miss a dose? This does not apply. What may interact with this medicine?  certain antibiotics given by injection  medicines for inflammation or pain like ibuprofen, naproxen  some diuretics like bumetanide, furosemide  cyclosporine  parathyroid hormone  tacrolimus  teriparatide  thalidomide This  list may not describe all possible interactions. Give your health care provider a list of all the medicines, herbs, non-prescription drugs, or dietary supplements you use. Also tell them if you smoke, drink alcohol, or use illegal drugs. Some items may interact with your medicine. What should I watch for while using this medicine? Visit your doctor or health care professional for regular checkups. It may be some time before you see the benefit from this medicine. Do not stop taking your medicine unless your doctor tells you to. Your doctor may order blood tests or other tests to see how you are doing. Women should inform their doctor if they wish to become pregnant or think they might be pregnant. There is a potential for serious side effects to an unborn child. Talk to your health care professional or pharmacist for more information. You should make sure that you get enough calcium and vitamin D while you are taking this medicine. Discuss the foods you eat and the vitamins you take with  your health care professional. Some people who take this medicine have severe bone, joint, and/or muscle pain. This medicine may also increase your risk for a broken thigh bone. Tell your doctor right away if you have pain in your upper leg or groin. Tell your doctor if you have any pain that does not go away or that gets worse. What side effects may I notice from receiving this medicine? Side effects that you should report to your doctor or health care professional as soon as possible:  allergic reactions like skin rash, itching or hives, swelling of the face, lips, or tongue  black or tarry stools  changes in vision  eye inflammation, pain  high blood pressure  jaw pain, especially burning or cramping  muscle weakness  numb, tingling pain  swelling of feet or hands  trouble passing urine or change in the amount of urine  unable to move easily Side effects that usually do not require medical attention  (report to your doctor or health care professional if they continue or are bothersome):  bone, joint, or muscle pain  constipation  dizzy, drowsy  fever  headache  loss of appetite  nausea, vomiting  pain at site where injected This list may not describe all possible side effects. Call your doctor for medical advice about side effects. You may report side effects to FDA at 1-800-FDA-1088. Where should I keep my medicine? This drug is given in a hospital or clinic and will not be stored at home. NOTE: This sheet is a summary. It may not cover all possible information. If you have questions about this medicine, talk to your doctor, pharmacist, or health care provider.  2020 Elsevier/Gold Standard (2010-08-13 08:49:49)

## 2019-01-28 ENCOUNTER — Other Ambulatory Visit: Payer: Self-pay

## 2019-01-28 DIAGNOSIS — C50312 Malignant neoplasm of lower-inner quadrant of left female breast: Secondary | ICD-10-CM

## 2019-01-28 NOTE — Progress Notes (Signed)
Order for CMP successfully faxed to [phone].

## 2019-01-29 ENCOUNTER — Other Ambulatory Visit: Payer: Self-pay

## 2019-01-29 ENCOUNTER — Inpatient Hospital Stay: Payer: Medicare Other | Attending: Oncology

## 2019-01-29 DIAGNOSIS — Z170 Estrogen receptor positive status [ER+]: Secondary | ICD-10-CM

## 2019-01-29 DIAGNOSIS — C3412 Malignant neoplasm of upper lobe, left bronchus or lung: Secondary | ICD-10-CM | POA: Insufficient documentation

## 2019-01-29 DIAGNOSIS — C50312 Malignant neoplasm of lower-inner quadrant of left female breast: Secondary | ICD-10-CM

## 2019-01-29 LAB — CMP (CANCER CENTER ONLY)
ALT: 31 U/L (ref 0–44)
AST: 20 U/L (ref 15–41)
Albumin: 4.2 g/dL (ref 3.5–5.0)
Alkaline Phosphatase: 66 U/L (ref 38–126)
Anion gap: 10 (ref 5–15)
BUN: 13 mg/dL (ref 6–20)
CO2: 26 mmol/L (ref 22–32)
Calcium: 8.9 mg/dL (ref 8.9–10.3)
Chloride: 107 mmol/L (ref 98–111)
Creatinine: 1.23 mg/dL — ABNORMAL HIGH (ref 0.44–1.00)
GFR, Est AFR Am: 56 mL/min — ABNORMAL LOW (ref >60)
GFR, Estimated: 49 mL/min — ABNORMAL LOW (ref >60)
Glucose, Bld: 120 mg/dL — ABNORMAL HIGH (ref 70–99)
Potassium: 3.1 mmol/L — ABNORMAL LOW (ref 3.5–5.1)
Sodium: 143 mmol/L (ref 135–145)
Total Bilirubin: 0.4 mg/dL (ref 0.3–1.2)
Total Protein: 7.7 g/dL (ref 6.5–8.1)

## 2019-02-11 ENCOUNTER — Other Ambulatory Visit: Payer: Self-pay | Admitting: Surgery

## 2019-02-11 DIAGNOSIS — E210 Primary hyperparathyroidism: Secondary | ICD-10-CM

## 2019-02-11 DIAGNOSIS — Z9889 Other specified postprocedural states: Secondary | ICD-10-CM

## 2019-02-15 ENCOUNTER — Other Ambulatory Visit: Payer: Self-pay | Admitting: Oncology

## 2019-02-20 ENCOUNTER — Ambulatory Visit (HOSPITAL_COMMUNITY)
Admission: RE | Admit: 2019-02-20 | Discharge: 2019-02-20 | Disposition: A | Discharge status: Home / Self Care | Payer: Medicare Other | Source: Ambulatory Visit | Attending: Surgery | Admitting: Surgery

## 2019-02-20 ENCOUNTER — Other Ambulatory Visit: Payer: Self-pay

## 2019-02-20 DIAGNOSIS — Z9889 Other specified postprocedural states: Secondary | ICD-10-CM | POA: Insufficient documentation

## 2019-02-20 DIAGNOSIS — E210 Primary hyperparathyroidism: Secondary | ICD-10-CM | POA: Diagnosis not present

## 2019-02-20 MED ORDER — TECHNETIUM TC 99M SESTAMIBI GENERIC - CARDIOLITE
26.0000 | Freq: Once | INTRAVENOUS | Status: AC | PRN
  Administered 2019-02-20: 26 via INTRAVENOUS

## 2019-02-27 ENCOUNTER — Ambulatory Visit: Payer: Self-pay | Admitting: Surgery

## 2019-03-21 ENCOUNTER — Other Ambulatory Visit: Payer: Self-pay

## 2019-03-21 ENCOUNTER — Other Ambulatory Visit (HOSPITAL_COMMUNITY)
Admission: RE | Admit: 2019-03-21 | Discharge: 2019-03-21 | Disposition: A | Discharge status: Home / Self Care | Payer: Medicare Other | Source: Ambulatory Visit | Attending: Surgery | Admitting: Surgery

## 2019-03-21 ENCOUNTER — Encounter (HOSPITAL_BASED_OUTPATIENT_CLINIC_OR_DEPARTMENT_OTHER): Payer: Self-pay | Admitting: Surgery

## 2019-03-21 DIAGNOSIS — Z01812 Encounter for preprocedural laboratory examination: Secondary | ICD-10-CM | POA: Insufficient documentation

## 2019-03-21 DIAGNOSIS — Z20822 Contact with and (suspected) exposure to covid-19: Secondary | ICD-10-CM | POA: Insufficient documentation

## 2019-03-21 LAB — SARS CORONAVIRUS 2 (TAT 6-24 HRS): SARS Coronavirus 2: NEGATIVE

## 2019-03-21 NOTE — Progress Notes (Addendum)
ADDENDUM:  Chart reviewed by anesthesia, Konrad Felix, pt stable at last transplant clinic visit, labs can be done morning of surgery , ok to proceed.   ADDENDUM:  Noted surgery time change for pt surgery 03-25-2019.  Called and spoke w/ pt via phone to inform her.  Pt verbalized understanding to arrive at 1015 and npo after mn w/ exception clear liquids until 0600 then nothing by mouth , only sips of water with medication.    Spoke w/ via phone for pre-op interview--- PT  Lab needs dos----  CBC,CMP             Lab results------ current ekg in care everywhere dated 01-09-2019 (requested ekg tracing via fax)  COVID test ------ 03-21-2019 Arrive at ------- 1200 NPO after ------ MN w/ exception clear liquids until 0800 then nothing by mouth (no cream/ milk products) Medications to take morning of surgery ----- Norvasc, Arimidex, Sensipar, Prednisone, Prograft w/ sips of water Diabetic medication ----- n/a Patient Special Instructions ----- n/a  Pre-Op special Istructions ----- pt states she is a hard IV stick, veins small and roll  Patient verbalized understanding of instructions that were given at this phone interview. Patient denies shortness of breath, chest pain, fever, cough a this phone interview.   Anesthesia Review:  Hx kiidney transplant 02-12-2014 @[facility]  for ESRD secondary to FSGS.  Hx breast cancer s/p chemoradiation and surgery 2018.  11/ [facility]  admission @[facility]  acute kidney injury due to hypercalcemia, resolved.  Last lab results 02-14-2019 in care everywhere, stable. Pt denies any acute symptoms.  PCP:  Per pt followed by transplant clinic @[facility] , Dr Wess Botts Cassell Clement 02-14-2019 care everywhere)  Nephrologist:  Dr Carlean Jews. Humberto Leep [facility]) per pt lov aug or sept 2020,  Requested lov note.  Oncologist:  Dr Jana Hakim ([place] 01-18-2019 epic)  Cardiologist : no Chest x-ray : 01-02-2012 EKG : 01-09-2019 care everywehre (requested tracing via fax)  Stress test:  Stress  echo 07-11-2013 care everywhere Echo : 05-31-2016 epic Cardiac Cath :  No  Sleep Study/ CPAP : NO Fasting Blood Sugar :   / Checks Blood Sugar -- times a day:    N/A  Blood Thinner/ Instructions /Last Dose: NO ASA / Instructions/ Last Dose :  ASA 81mg /  Per pt last dose 03-20-2019/ pt stated that she has had many surgery and aware to stop asa prior to surgery

## 2019-03-24 ENCOUNTER — Encounter (HOSPITAL_BASED_OUTPATIENT_CLINIC_OR_DEPARTMENT_OTHER): Payer: Self-pay | Admitting: Surgery

## 2019-03-24 DIAGNOSIS — E210 Primary hyperparathyroidism: Secondary | ICD-10-CM | POA: Diagnosis present

## 2019-03-24 NOTE — H&P (Signed)
General Surgery [facility]  CAYTLYN EVERS DOB: 05/09/1960 Married / Language: English / Race: Black or African American Female   History of Present Illness   The patient is a 59 year old female who presents with primary hyperparathyroidism.  CHIEF COMPLAINT: hypercalcemia, primary hyperparathyroidism  Patient is referred by Dr. Gunnar Bulla Magrinat for surgical evaluation and management of suspected primary hyperparathyroidism. Patient has a recent history of hypercalcemia. She has had episodes where her calcium level was greater than 13. She has a complex history having undergone renal transplantation in December 2015. She has never been told she had secondary hyperparathyroidism. She has had previous neck surgery undergoing a partial thyroidectomy here in [place] many years ago. She does not know the details of that operation and I do not have any records. Patient has been followed for her kidney transplantation at [facility]. She has been taking Sensipar recently. She did have an admission to [facility] in the fall of 2024 hypercalcemia. Patient states she has not had any imaging studies performed. We obtained recent laboratory levels from [facility] which show a calcium level of 11.4, a parathyroid hormone level of 254, and a 25-hydroxy vitamin D level of 24.9. Patient does note moderate fatigue. She denies any history of nephrolithiasis. She denies any history of osteopenia or osteoporosis. She presents today on referral from her medical oncologist for evaluation for possible parathyroid surgery. She is scheduled to see her nephrologist at [facility] later this week.   Problem List/Past Medical  SEBACEOUS CYST (L72.3)  we discussed procedure. I prepped area and then draped it out. I injected lidocaine and then made an elliptical incision surrounding the cyst. i then sharply removed the entire cyst and contents. this was sent to  pathology. I then closed with 3-0 vicryl and 3-0 nylon sutures. dressing was placed. she will return in 10-14 days for suture removal. BREAST CANCER OF LOWER-OUTER QUADRANT OF LEFT FEMALE BREAST (C50.512)  no clinical evidence of recurrence, she is doing well overall. will continue annual mm and at least every six month exams, continue anastrozole HISTORY OF THYROID SURGERY (Z98.890)  PRIMARY HYPERPARATHYROIDISM (E21.0)   Past Surgical History Breast Biopsy  Left. multiple Cesarean Section - 1  Thyroid Surgery  Other Surgery  renal transplant  Diagnostic Studies History  Colonoscopy  1-5 years ago Mammogram  within last year Pap Smear  1-5 years ago  Allergies No Known Allergies Allergies Reconciled   Medication History Vitamin D (Oral) Specific strength unknown - Active. Amlodipine Besy-Benazepril HCl (5-10MG  Capsule, Oral) Active. Lovastatin (20MG  Tablet, Oral) Active. Sensipar (60MG  Tablet, Oral) Active. Aspirin (81MG  Tablet DR, Oral) Active. Magnesium Lactate (84 MG (7MEQ) Tablet ER, Oral) Active. Prograf (1MG  Capsule, Oral) Active. Anastrozole (1MG  Tablet, Oral) Active. Atorvastatin Calcium (10MG  Tablet, Oral) Active. Medications Reconciled  Social History Alcohol use  Occasional alcohol use. Caffeine use  Carbonated beverages. No drug use  Tobacco use  Never smoker.  Family History Cancer  Brother, Family Members In General. Cerebrovascular Accident  Family Members In General. Diabetes Mellitus  Family Members In General. Hypertension  Family Members In General, Mother. Prostate Cancer  Brother. Respiratory Condition  Brother, Family Members In General.  Pregnancy / Birth History  Age at menarche  42 years. Contraceptive History  Oral contraceptives. Gravida  3 Length (months) of breastfeeding  3-6 Maternal age  86-25 Para  3 Regular periods   Other Problems  Chronic Renal Failure Syndrome  Gastroesophageal Reflux  Disease  High blood pressure  Hypercholesterolemia  Lump In Breast   Vitals Weight: 177 lb Height: 63in Body Surface Area: 1.84 m Body Mass Index: 31.35 kg/m  Temp.: 44F  Pulse: 102 (Regular)  BP: 134/84 (Sitting, Left Arm, Standard)  Physical Exam   GENERAL APPEARANCE Development: normal Nutritional status: normal Gross deformities: none  SKIN Rash, lesions, ulcers: none Induration, erythema: none Nodules: none palpable  EYES Conjunctiva and lids: normal Pupils: equal and reactive Iris: normal bilaterally  EARS, NOSE, MOUTH, THROAT External ears: no lesion or deformity External nose: no lesion or deformity Hearing: grossly normal Patient is wearing a mask  NECK Symmetric: yes Trachea: midline Thyroid: no palpable nodules in the thyroid bed Well-healed low right sided Kocher incision  CHEST Respiratory effort: normal Retraction or accessory muscle use: no Breath sounds: normal bilaterally Rales, rhonchi, wheeze: none  CARDIOVASCULAR Auscultation: regular rhythm, normal rate Murmurs: grade i-ii systolic murmur ULSB Pulses: carotid and radial pulse 2+ palpable Lower extremity edema: none Lower extremity varicosities: none  MUSCULOSKELETAL Station and gait: normal Digits and nails: no clubbing or cyanosis Muscle strength: grossly normal all extremities Range of motion: grossly normal all extremities Deformity: none  LYMPHATIC Cervical: none palpable Supraclavicular: none palpable  PSYCHIATRIC Oriented to person, place, and time: yes Mood and affect: normal for situation Judgment and insight: appropriate for situation    Assessment & Plan  PRIMARY HYPERPARATHYROIDISM (E21.0) HISTORY OF THYROID SURGERY (Z98.890)  Follow Up - Call [facility] office after tests / studies doneto discuss further plans  Patient is referred by her medical oncologist for evaluation of suspected primary hyperparathyroidism. Patient is provided with  written literature on parathyroid disease to review at home.  Patient has biochemical evidence of primary hyperparathyroidism. She does have a complex history given her history of end-stage renal disease and kidney transplantation. She does not appear to have had secondary hyperparathyroidism. She is also not had any imaging, particularly given her history of thyroid surgery. Therefore I would like to obtain an ultrasound examination of the neck to evaluate both the thyroid and the parathyroid glands. I would also like to schedule her for a nuclear medicine parathyroid scan, sestamibi, to evaluate for possible parathyroid adenoma. We discussed the studies and the information that they would provide. We discussed the possibility of single gland adenoma which might make her a good candidate to undergo minimally invasive outpatient surgery. I told her we would discuss this further once we had the results of the above studies and once I had been in touch with her nephrologist at [facility].  Patient will undergo the above studies. We will contact her when the results are available.  Pt Education - Pamphlet Given - The Parathyroid Surgery Book: discussed with patient and provided information.  ADDENDUM  USN and sestamibi localize a right inferior parathyroid adenoma.  Will plan minimally invasive surgery as an out-patient procedure.  The risks and benefits of the procedure have been discussed at length with the patient.  The patient understands the proposed procedure, potential alternative treatments, and the course of recovery to be expected.  All of the patient's questions have been answered at this time.  The patient wishes to proceed with surgery.  Armandina Gemma, MD [facility] Office: [phone]

## 2019-03-25 ENCOUNTER — Encounter (HOSPITAL_BASED_OUTPATIENT_CLINIC_OR_DEPARTMENT_OTHER): Payer: Self-pay | Admitting: Surgery

## 2019-03-25 ENCOUNTER — Ambulatory Visit (HOSPITAL_BASED_OUTPATIENT_CLINIC_OR_DEPARTMENT_OTHER): Payer: Medicare Other | Admitting: Physician Assistant

## 2019-03-25 ENCOUNTER — Ambulatory Visit (HOSPITAL_BASED_OUTPATIENT_CLINIC_OR_DEPARTMENT_OTHER)
Admission: RE | Admit: 2019-03-25 | Discharge: 2019-03-25 | Disposition: A | Discharge status: Home / Self Care | Payer: Medicare Other | Attending: Surgery | Admitting: Surgery

## 2019-03-25 ENCOUNTER — Encounter (HOSPITAL_BASED_OUTPATIENT_CLINIC_OR_DEPARTMENT_OTHER)
Admission: RE | Disposition: A | Discharge status: Home / Self Care | Payer: Self-pay | Source: Home / Self Care | Attending: Surgery

## 2019-03-25 ENCOUNTER — Other Ambulatory Visit: Payer: Self-pay

## 2019-03-25 DIAGNOSIS — N189 Chronic kidney disease, unspecified: Secondary | ICD-10-CM | POA: Diagnosis not present

## 2019-03-25 DIAGNOSIS — Z79899 Other long term (current) drug therapy: Secondary | ICD-10-CM | POA: Diagnosis not present

## 2019-03-25 DIAGNOSIS — Z7982 Long term (current) use of aspirin: Secondary | ICD-10-CM | POA: Insufficient documentation

## 2019-03-25 DIAGNOSIS — D351 Benign neoplasm of parathyroid gland: Secondary | ICD-10-CM | POA: Insufficient documentation

## 2019-03-25 DIAGNOSIS — K219 Gastro-esophageal reflux disease without esophagitis: Secondary | ICD-10-CM | POA: Insufficient documentation

## 2019-03-25 DIAGNOSIS — E7800 Pure hypercholesterolemia, unspecified: Secondary | ICD-10-CM | POA: Diagnosis not present

## 2019-03-25 DIAGNOSIS — E890 Postprocedural hypothyroidism: Secondary | ICD-10-CM | POA: Insufficient documentation

## 2019-03-25 DIAGNOSIS — E210 Primary hyperparathyroidism: Secondary | ICD-10-CM | POA: Diagnosis present

## 2019-03-25 DIAGNOSIS — Z940 Kidney transplant status: Secondary | ICD-10-CM | POA: Insufficient documentation

## 2019-03-25 DIAGNOSIS — I129 Hypertensive chronic kidney disease with stage 1 through stage 4 chronic kidney disease, or unspecified chronic kidney disease: Secondary | ICD-10-CM | POA: Diagnosis not present

## 2019-03-25 HISTORY — DX: Primary hyperparathyroidism: E21.0

## 2019-03-25 HISTORY — DX: Malignant neoplasm of unspecified site of unspecified female breast: C50.919

## 2019-03-25 HISTORY — DX: Hypercalcemia: E83.52

## 2019-03-25 HISTORY — DX: Immunodeficiency, unspecified: D84.9

## 2019-03-25 HISTORY — DX: Kidney transplant status: Z94.0

## 2019-03-25 HISTORY — DX: Chronic kidney disease, stage 3 unspecified: N18.30

## 2019-03-25 HISTORY — DX: Leiomyoma of uterus, unspecified: D25.9

## 2019-03-25 HISTORY — DX: Hypokalemia: E87.6

## 2019-03-25 HISTORY — DX: Diverticulosis of large intestine without perforation or abscess without bleeding: K57.30

## 2019-03-25 HISTORY — DX: Hypomagnesemia: E83.42

## 2019-03-25 HISTORY — PX: PARATHYROIDECTOMY: SHX19

## 2019-03-25 HISTORY — DX: Personal history of other diseases of urinary system: Z87.448

## 2019-03-25 LAB — BASIC METABOLIC PANEL
Anion gap: 10 (ref 5–15)
BUN: 16 mg/dL (ref 6–20)
CO2: 27 mmol/L (ref 22–32)
Calcium: 10.8 mg/dL — ABNORMAL HIGH (ref 8.9–10.3)
Chloride: 102 mmol/L (ref 98–111)
Creatinine, Ser: 1.52 mg/dL — ABNORMAL HIGH (ref 0.44–1.00)
GFR calc Af Amer: 44 mL/min — ABNORMAL LOW (ref >60)
GFR calc non Af Amer: 38 mL/min — ABNORMAL LOW (ref >60)
Glucose, Bld: 111 mg/dL — ABNORMAL HIGH (ref 70–99)
Potassium: 3.1 mmol/L — ABNORMAL LOW (ref 3.5–5.1)
Sodium: 139 mmol/L (ref 135–145)

## 2019-03-25 LAB — CBC
HCT: 37.1 % (ref 36.0–46.0)
Hemoglobin: 11.9 g/dL — ABNORMAL LOW (ref 12.0–15.0)
MCH: 27.9 pg (ref 26.0–34.0)
MCHC: 32.1 g/dL (ref 30.0–36.0)
MCV: 86.9 fL (ref 80.0–100.0)
Platelets: 208 10*3/uL (ref 150–400)
RBC: 4.27 MIL/uL (ref 3.87–5.11)
RDW: 13.5 % (ref 11.5–15.5)
WBC: 12.7 10*3/uL — ABNORMAL HIGH (ref 4.0–10.5)
nRBC: 0 % (ref 0.0–0.2)

## 2019-03-25 SURGERY — PARATHYROIDECTOMY
Anesthesia: General | Site: Neck | Laterality: Right

## 2019-03-25 MED ORDER — DEXAMETHASONE SODIUM PHOSPHATE 4 MG/ML IJ SOLN
INTRAMUSCULAR | Status: DC | PRN
  Administered 2019-03-25: 8 mg via INTRAVENOUS

## 2019-03-25 MED ORDER — PROPOFOL 10 MG/ML IV BOLUS
INTRAVENOUS | Status: AC
  Filled 2019-03-25: qty 20

## 2019-03-25 MED ORDER — CHLORHEXIDINE GLUCONATE CLOTH 2 % EX PADS
6.0000 | MEDICATED_PAD | Freq: Once | CUTANEOUS | Status: DC
  Filled 2019-03-25: qty 6

## 2019-03-25 MED ORDER — CEFAZOLIN SODIUM-DEXTROSE 2-4 GM/100ML-% IV SOLN
2.0000 g | INTRAVENOUS | Status: AC
  Administered 2019-03-25: 2 g via INTRAVENOUS
  Filled 2019-03-25: qty 100

## 2019-03-25 MED ORDER — LACTATED RINGERS IV SOLN
INTRAVENOUS | Status: DC
  Filled 2019-03-25: qty 1000

## 2019-03-25 MED ORDER — CEFAZOLIN SODIUM-DEXTROSE 2-4 GM/100ML-% IV SOLN
INTRAVENOUS | Status: AC
  Filled 2019-03-25: qty 100

## 2019-03-25 MED ORDER — PROPOFOL 10 MG/ML IV BOLUS
INTRAVENOUS | Status: DC | PRN
  Administered 2019-03-25: 140 mg via INTRAVENOUS
  Administered 2019-03-25: 20 mg via INTRAVENOUS

## 2019-03-25 MED ORDER — FENTANYL CITRATE (PF) 100 MCG/2ML IJ SOLN
INTRAMUSCULAR | Status: DC | PRN
  Administered 2019-03-25: 25 ug via INTRAVENOUS
  Administered 2019-03-25: 100 ug via INTRAVENOUS
  Administered 2019-03-25: 50 ug via INTRAVENOUS

## 2019-03-25 MED ORDER — ONDANSETRON HCL 4 MG/2ML IJ SOLN
INTRAMUSCULAR | Status: AC
  Filled 2019-03-25: qty 2

## 2019-03-25 MED ORDER — SODIUM CHLORIDE 0.9 % IV SOLN
INTRAVENOUS | Status: DC
  Filled 2019-03-25: qty 1000

## 2019-03-25 MED ORDER — ONDANSETRON HCL 4 MG/2ML IJ SOLN
INTRAMUSCULAR | Status: DC | PRN
  Administered 2019-03-25: 4 mg via INTRAVENOUS

## 2019-03-25 MED ORDER — MIDAZOLAM HCL 5 MG/5ML IJ SOLN
INTRAMUSCULAR | Status: DC | PRN
  Administered 2019-03-25: 2 mg via INTRAVENOUS

## 2019-03-25 MED ORDER — FENTANYL CITRATE (PF) 100 MCG/2ML IJ SOLN
25.0000 ug | INTRAMUSCULAR | Status: DC | PRN
  Filled 2019-03-25: qty 1

## 2019-03-25 MED ORDER — DEXAMETHASONE SODIUM PHOSPHATE 10 MG/ML IJ SOLN
INTRAMUSCULAR | Status: AC
  Filled 2019-03-25: qty 1

## 2019-03-25 MED ORDER — ROCURONIUM BROMIDE 10 MG/ML (PF) SYRINGE
PREFILLED_SYRINGE | INTRAVENOUS | Status: AC
  Filled 2019-03-25: qty 10

## 2019-03-25 MED ORDER — ONDANSETRON HCL 4 MG/2ML IJ SOLN
4.0000 mg | Freq: Once | INTRAMUSCULAR | Status: DC | PRN
  Filled 2019-03-25: qty 2

## 2019-03-25 MED ORDER — ROCURONIUM BROMIDE 100 MG/10ML IV SOLN
INTRAVENOUS | Status: DC | PRN
  Administered 2019-03-25: 50 mg via INTRAVENOUS

## 2019-03-25 MED ORDER — BUPIVACAINE HCL 0.25 % IJ SOLN
INTRAMUSCULAR | Status: DC | PRN
  Administered 2019-03-25: 10 mL

## 2019-03-25 MED ORDER — OXYCODONE HCL 5 MG/5ML PO SOLN
5.0000 mg | Freq: Once | ORAL | Status: DC | PRN
  Filled 2019-03-25: qty 5

## 2019-03-25 MED ORDER — OXYCODONE HCL 5 MG PO TABS
5.0000 mg | ORAL_TABLET | Freq: Once | ORAL | Status: DC | PRN
  Filled 2019-03-25: qty 1

## 2019-03-25 MED ORDER — LIDOCAINE 2% (20 MG/ML) 5 ML SYRINGE
INTRAMUSCULAR | Status: AC
  Filled 2019-03-25: qty 5

## 2019-03-25 MED ORDER — TRAMADOL HCL 50 MG PO TABS: 50.0000 mg | ORAL_TABLET | Freq: Four times a day (QID) | ORAL | 0 refills | Status: DC | PRN

## 2019-03-25 MED ORDER — SUGAMMADEX SODIUM 200 MG/2ML IV SOLN
INTRAVENOUS | Status: DC | PRN
  Administered 2019-03-25: 150 mg via INTRAVENOUS

## 2019-03-25 MED ORDER — MIDAZOLAM HCL 2 MG/2ML IJ SOLN
INTRAMUSCULAR | Status: AC
  Filled 2019-03-25: qty 2

## 2019-03-25 MED ORDER — FENTANYL CITRATE (PF) 250 MCG/5ML IJ SOLN
INTRAMUSCULAR | Status: AC
  Filled 2019-03-25: qty 5

## 2019-03-25 SURGICAL SUPPLY — 29 items
ATTRACTOMAT 16X20 MAGNETIC DRP (DRAPES) ×2 IMPLANT
BLADE SURG 15 STRL LF DISP TIS (BLADE) ×1 IMPLANT
BLADE SURG 15 STRL SS (BLADE) ×1
CHLORAPREP W/TINT 26 (MISCELLANEOUS) ×2 IMPLANT
CLIP VESOCCLUDE MED 6/CT (CLIP) ×4 IMPLANT
CLIP VESOCCLUDE SM WIDE 6/CT (CLIP) ×4 IMPLANT
COVER SURGICAL LIGHT HANDLE (MISCELLANEOUS) ×2 IMPLANT
COVER WAND RF STERILE (DRAPES) ×2 IMPLANT
DERMABOND ADVANCED (GAUZE/BANDAGES/DRESSINGS) ×1
DERMABOND ADVANCED .7 DNX12 (GAUZE/BANDAGES/DRESSINGS) ×1 IMPLANT
DRAPE LAPAROTOMY T 98X78 PEDS (DRAPES) ×2 IMPLANT
ELECT REM PT RETURN 9FT ADLT (ELECTROSURGICAL) ×2
ELECTRODE REM PT RTRN 9FT ADLT (ELECTROSURGICAL) ×1 IMPLANT
GAUZE 4X4 16PLY RFD (DISPOSABLE) ×2 IMPLANT
GLOVE SURG ORTHO 8.0 STRL STRW (GLOVE) ×2 IMPLANT
GOWN STRL REUS W/TWL XL LVL3 (GOWN DISPOSABLE) ×6 IMPLANT
HEMOSTAT SURGICEL 2X4 FIBR (HEMOSTASIS) ×2 IMPLANT
ILLUMINATOR WAVEGUIDE N/F (MISCELLANEOUS) IMPLANT
KIT BASIN OR (CUSTOM PROCEDURE TRAY) ×2 IMPLANT
KIT TURNOVER CYSTO (KITS) IMPLANT
NEEDLE HYPO 25X1 1.5 SAFETY (NEEDLE) ×2 IMPLANT
PACK BASIC VI WITH GOWN DISP (CUSTOM PROCEDURE TRAY) ×2 IMPLANT
PENCIL SMOKE EVACUATOR (MISCELLANEOUS) ×2 IMPLANT
SUT MNCRL AB 4-0 PS2 18 (SUTURE) ×2 IMPLANT
SUT VIC AB 3-0 SH 18 (SUTURE) ×2 IMPLANT
SYR BULB IRRIGATION 50ML (SYRINGE) ×2 IMPLANT
SYR CONTROL 10ML LL (SYRINGE) ×2 IMPLANT
TOWEL OR 17X26 10 PK STRL BLUE (TOWEL DISPOSABLE) ×2 IMPLANT
TUBE CONNECTING 12X1/4 (SUCTIONS) ×2 IMPLANT

## 2019-03-25 NOTE — Transfer of Care (Signed)
Immediate Anesthesia Transfer of Care Note  Patient: Erika Freeman  Procedure(s) Performed: RIGHT INFERIOR PARATHYROIDECTOMY (Right Neck)  Patient Location: PACU  Anesthesia Type:General  Level of Consciousness: drowsy, patient cooperative and responds to stimulation  Airway & Oxygen Therapy: Patient Spontanous Breathing and Patient connected to face mask oxygen  Post-op Assessment: Report given to RN and Post -op Vital signs reviewed and stable  Post vital signs: Reviewed and stable  Last Vitals:  Vitals Value Taken Time  BP 153/89 03/25/19 1347  Temp    Pulse 85 03/25/19 1348  Resp 14 03/25/19 1348  SpO2 99 % 03/25/19 1348  Vitals shown include unvalidated device data.  Last Pain:  Vitals:   03/25/19 1031  TempSrc: Oral      Patients Stated Pain Goal: 4 (96/72/89 7915)  Complications: No apparent anesthesia complications

## 2019-03-25 NOTE — Anesthesia Postprocedure Evaluation (Signed)
Anesthesia Post Note  Patient: Erika Freeman  Procedure(s) Performed: RIGHT SUPERIOR PARATHYROIDECTOMY (Right Neck)     Patient location during evaluation: PACU Anesthesia Type: General Level of consciousness: awake and alert Pain management: pain level controlled Vital Signs Assessment: post-procedure vital signs reviewed and stable Respiratory status: spontaneous breathing, nonlabored ventilation and respiratory function stable Cardiovascular status: blood pressure returned to baseline and stable Postop Assessment: no apparent nausea or vomiting Anesthetic complications: no    Last Vitals:  Vitals:   03/25/19 1415 03/25/19 1515  BP: (!) 161/92 (!) 139/91  Pulse: 90 80  Resp: 16 16  Temp:  36.9 C  SpO2: 92% 92%    Last Pain:  Vitals:   03/25/19 1515  TempSrc:   PainSc: 0-No pain                 Lidia Collum

## 2019-03-25 NOTE — Interval H&P Note (Signed)
History and Physical Interval Note:  03/25/2019 11:58 AM  Erika Freeman  has presented today for surgery, with the diagnosis of PRIMARY HYPERTHYROIDISM.  The various methods of treatment have been discussed with the patient and family. After consideration of risks, benefits and other options for treatment, the patient has consented to    Procedure(s): RIGHT INFERIOR PARATHYROIDECTOMY (Right) as a surgical intervention.    The patient's history has been reviewed, patient examined, no change in status, stable for surgery.  I have reviewed the patient's chart and labs.  Questions were answered to the patient's satisfaction.    Armandina Gemma, MD [facility] Office: [place]

## 2019-03-25 NOTE — Op Note (Signed)
OPERATIVE REPORT - PARATHYROIDECTOMY  Preoperative diagnosis: Primary hyperparathyroidism  Postop diagnosis: Same  Procedure: Right superior minimally invasive parathyroidectomy  Surgeon:  Armandina Gemma, MD  Anesthesia: General endotracheal  Estimated blood loss: Minimal  Preparation: ChloraPrep  Indications: Patient is referred by Dr. Gunnar Bulla Magrinat for surgical evaluation and management of suspected primary hyperparathyroidism. Patient has a recent history of hypercalcemia. She has had episodes where her calcium level was greater than 13. She has a complex history having undergone renal transplantation in December 2015. She has never been told she had secondary hyperparathyroidism. She has had previous neck surgery undergoing a partial thyroidectomy here in [place] many years ago. She does not know the details of that operation and I do not have any records. Patient has been followed for her kidney transplantation at [facility] She has been taking Sensipar recently. She did have an admission to [facility] in the fall of 2024 hypercalcemia. Patient states she has not had any imaging studies performed. We obtained recent laboratory levels from [facility] which show a calcium level of 11.4, a parathyroid hormone level of 254, and a 25-hydroxy vitamin D level of 24.9. Patient does note moderate fatigue. She denies any history of nephrolithiasis. She denies any history of osteopenia or osteoporosis. Sestamibi and USN localize an adenoma to the area posterior to the right lobe of the thyroid.  Of note, the patient has a history of right inferior parathyroid adenoma removed by me in 2008.   Procedure: The patient was prepared in the pre-operative holding area. The patient was brought to the operating room and placed in a supine position on the operating room table. Following administration of general anesthesia, the patient was positioned and then prepped and  draped in the usual strict aseptic fashion. After ascertaining that an adequate level of anesthesia been achieved, a neck incision was made with a #15 blade. Dissection was carried through subcutaneous tissues and platysma. Hemostasis was obtained with the electrocautery. Skin flaps were developed circumferentially and a Weitlander retractor was placed for exposure.  Strap muscles were incised in the midline. Strap muscles were reflected lateralley exposing the thyroid lobe. With gentle blunt dissection the thyroid lobe was mobilized.  There was some evidence of scar tissue from the prior surgery Dissection was carried through adipose tissue and an enlarged parathyroid gland was identified. It was gently mobilized. Vascular structures entered the gland from superiorly and were divided between ligaclips. Care was taken to avoid the recurrent laryngeal nerve which lay directly anterior to the adenoma and the esophagus which lay medially. The parathyroid gland was completely excised. It was submitted to pathology where frozen section confirmed parathyroid tissue consistent with adenoma according to Dr. Vicente Males.  Neck was irrigated with warm saline and good hemostasis was noted. Surgicel was placed in the operative field. Strap muscles were approximated in the midline with interrupted 3-0 Vicryl sutures. Platysma was closed with interrupted 3-0 Vicryl sutures. Marcaine was infiltrated circumferentially. Skin was closed with a running 4-0 Monocryl subcuticular suture. Wound was washed and dried and Dermabond was applied. Patient was awakened from anesthesia and brought to the recovery room. The patient tolerated the procedure well.   Armandina Gemma, MD [facility] Office: [phone]

## 2019-03-25 NOTE — Anesthesia Preprocedure Evaluation (Addendum)
Anesthesia Evaluation  Patient identified by MRN, date of birth, ID band Patient awake    Reviewed: Allergy & Precautions, NPO status , Patient's Chart, lab work & pertinent test results  History of Anesthesia Complications Negative for: history of anesthetic complications  Airway Mallampati: II  TM Distance: >3 FB Neck ROM: Full    Dental  (+) Teeth Intact   Pulmonary neg pulmonary ROS,    Pulmonary exam normal        Cardiovascular hypertension, Normal cardiovascular exam     Neuro/Psych negative neurological ROS  negative psych ROS   GI/Hepatic Neg liver ROS, GERD  ,  Endo/Other  Primary hyperparathyroidism  Renal/GU Renal InsufficiencyRenal disease (s/p renal transplant 2015)  negative genitourinary   Musculoskeletal negative musculoskeletal ROS (+)   Abdominal   Peds  Hematology  (+) anemia ,   Anesthesia Other Findings H/o breast cancer s/p chemo/XRT  Reproductive/Obstetrics                            Anesthesia Physical Anesthesia Plan  ASA: III  Anesthesia Plan: General   Post-op Pain Management:    Induction: Intravenous  PONV Risk Score and Plan: 3 and Ondansetron, Dexamethasone, Treatment may vary due to age or medical condition and Midazolam  Airway Management Planned: Oral ETT  Additional Equipment: None  Intra-op Plan:   Post-operative Plan: Extubation in OR  Informed Consent: I have reviewed the patients History and Physical, chart, labs and discussed the procedure including the risks, benefits and alternatives for the proposed anesthesia with the patient or authorized representative who has indicated his/her understanding and acceptance.     Dental advisory given  Plan Discussed with:   Anesthesia Plan Comments:        Anesthesia Quick Evaluation

## 2019-03-25 NOTE — Discharge Instructions (Signed)

## 2019-03-25 NOTE — Anesthesia Procedure Notes (Signed)
Procedure Name: Intubation Date/Time: 03/25/2019 12:22 PM Performed by: Glory Buff, CRNA Pre-anesthesia Checklist: Patient identified, Emergency Drugs available, Suction available and Patient being monitored Patient Re-evaluated:Patient Re-evaluated prior to induction Oxygen Delivery Method: Circle system utilized Preoxygenation: Pre-oxygenation with 100% oxygen Induction Type: IV induction Ventilation: Mask ventilation without difficulty Laryngoscope Size: Miller and 3 Grade View: Grade I Tube type: Oral Tube size: 7.0 mm Number of attempts: 1 Airway Equipment and Method: Stylet and Oral airway Placement Confirmation: ETT inserted through vocal cords under direct vision,  positive ETCO2 and breath sounds checked- equal and bilateral Secured at: 20 cm Tube secured with: Tape Dental Injury: Teeth and Oropharynx as per pre-operative assessment

## 2019-03-26 LAB — SURGICAL PATHOLOGY

## 2019-06-01 ENCOUNTER — Encounter: Payer: Self-pay | Admitting: Endocrinology

## 2019-06-14 ENCOUNTER — Other Ambulatory Visit: Payer: Self-pay

## 2019-06-18 ENCOUNTER — Encounter: Payer: Self-pay | Admitting: Endocrinology

## 2019-06-18 ENCOUNTER — Other Ambulatory Visit: Payer: Self-pay

## 2019-06-18 ENCOUNTER — Ambulatory Visit (INDEPENDENT_AMBULATORY_CARE_PROVIDER_SITE_OTHER): Payer: Medicare Other | Admitting: Endocrinology

## 2019-06-18 VITALS — BP 134/88 | HR 81 | Ht 65.0 in | Wt 169.6 lb

## 2019-06-18 DIAGNOSIS — N1831 Chronic kidney disease, stage 3a: Secondary | ICD-10-CM | POA: Diagnosis not present

## 2019-06-18 DIAGNOSIS — E1121 Type 2 diabetes mellitus with diabetic nephropathy: Secondary | ICD-10-CM | POA: Diagnosis not present

## 2019-06-18 DIAGNOSIS — E119 Type 2 diabetes mellitus without complications: Secondary | ICD-10-CM | POA: Diagnosis not present

## 2019-06-18 LAB — POCT GLYCOSYLATED HEMOGLOBIN (HGB A1C): Hemoglobin A1C: 7.7 % — AB (ref 4.0–5.6)

## 2019-06-18 MED ORDER — RYBELSUS 3 MG PO TABS: 3.0000 mg | ORAL_TABLET | Freq: Every day | ORAL | 11 refills | Status: DC

## 2019-06-18 NOTE — Patient Instructions (Addendum)
good diet and exercise significantly improve the control of your diabetes.  please let me know if you wish to be referred to a dietician.  high blood sugar is very risky to your health.  you should see an eye doctor and dentist every year.  It is very important to get all recommended vaccinations.  Controlling your blood pressure and cholesterol drastically reduces the damage diabetes does to your body.  Those who smoke should quit.  Please discuss these with your doctor.  check your blood sugar once a day.  vary the time of day when you check, between before the 3 meals, and at bedtime.  also check if you have symptoms of your blood sugar being too high or too low.  please keep a record of the readings and bring it to your next appointment here (or you can bring the meter itself).  You can write it on any piece of paper.  please call us sooner if your blood sugar goes below 70, or if you have a lot of readings over 200. I have sent a prescription to your pharmacy, to change the glipizide to "Rybelsus."  We will need to take this complex situation in stages.   Please come back for a follow-up appointment in 1 month.

## 2019-06-18 NOTE — Progress Notes (Signed)
Subjective:    Patient ID: Erika Freeman, female    DOB: 1960-10-25, 59 y.o.   MRN: [id number]  HPI pt is referred by [name], PA, for diabetes.  Pt states DM was dx'ed in 2020; she has h/o ESRD (not due to DM; she had transplant in 2015); she has never been on insulin; pt says his diet and exercise are good; she has never had GDM, pancreatitis, pancreatic surgery, severe hypoglycemia or DKA.  Glipizide was increased 2 weeks ago.  She says cbg varies from 143-233.   Past Medical History:  Diagnosis Date  . Anemia   . CKD (chronic kidney disease), stage III    nephologist-- dr Ria Comment [facility])  and transplant clinic [facility]  . Diverticulosis of colon   . GERD (gastroesophageal reflux disease)    03-21-2019 -- per pt w/ spicy foods and at night time occasionally,  chews watermelon gum and pt states it resolves  . Heart murmur    03-21-2019  per pt was told had murmur some years ago, pt is asymptomatic (last echo in [facility] 05-31-2016  trivial regurg MR and TR)  . History of end stage renal disease    secondary to FSGS with PD until kidney transplant 12/ 2015  . History of radiation therapy 12/20/16  to 01/17/17   left breast 50.4 gy in 28 fractions  . Hypercalcemia    pt had recent hospital admision @[facility]  (in care everywhere) for acute kidney injury due to hypercalcemia and hypomagnesium,  resolved with medication, lov w/ transplant clinic @[facility]  02-14-2019  calcium  normal per lab (care everywhere)   . Hypertension    followed by transplant clinic @[facility] ---  (pt had stress echo 07-11-2013, negative for ischemia, in care everywhere)  . Hypokalemia    pt hospital admission   . Hypomagnesemia   . Immunosuppression ([place])    due to kidney transplant 02-12-2014  . Kidney transplant status, living unrelated donor followed by transplant clinic @[facility] -- Dr Delane Ginger. [id number]   02-12-2014  @[facility]   . Malignant neoplasm of breast (female) [facility]) oncologist--- dr Jana Hakim (lov in  epic no recurrence 01-18-2019)   dx 03/ 2018  IDC Stage IIB ---  completed chemotherapy 10-11-2016;  s/p left breast lumpectomy with node dissection 11-14-2016;  completed radiation 01-17-2017  . Personal history of chemotherapy    left breast cancer--  06-01-2016  to 10-11-2016  . Primary hyperparathyroidism ([place])    12/ 2020 w/ hypercalcimia  (pt has hx of hyperparathyoidism s/p right inferior parathyroidectomy 11-27-2006 @[facility] ;  per path adenoma)  . Uterine fibroid     Past Surgical History:  Procedure Laterality Date  . BREAST BIOPSY    . BREAST LUMPECTOMY Left    2018  . BREAST LUMPECTOMY WITH RADIOACTIVE SEED AND SENTINEL LYMPH NODE BIOPSY Left 11/14/2016   Procedure: LEFT BREAST RADIOACTIVE SEED BRACKETED LUMPECTOMY, LEFT AXILLARY SENTINEL LYMPH NODE BIOPSY WITH BLUE DYE INJECTION;  Surgeon: [name], MD;  Location: [place];  Service: General;  Laterality: Left;  . BREAST SURGERY  2012   left - fibroadenoma  . CAPD INSERTION  01/10/2012   Procedure: LAPAROSCOPIC INSERTION CONTINUOUS AMBULATORY PERITONEAL DIALYSIS  (CAPD) CATHETER;  Surgeon: Adin Hector, MD;  Location: [place];  Service: General;  Laterality: N/A;  LAPAROSCOPIC CAPD CATHETER PLACEMENT OMENTOPEXY  . CESAREAN SECTION  1995  . COLONOSCOPY    . DILATION AND CURETTAGE OF UTERUS  2000  . KIDNEY TRANSPLANT    . PARATHYROIDECTOMY Right 11-27-2006  dr gerkin @[facility]    right inferior  (adenoma)  . PARATHYROIDECTOMY Right 03/25/2019   Procedure: RIGHT SUPERIOR PARATHYROIDECTOMY;  Surgeon: Armandina Gemma, MD;  Location: [place];  Service: General;  Laterality: Right;  . PORT-A-CATH REMOVAL Right 11/14/2016   Procedure: REMOVAL PORT-A-CATH;  Surgeon: [name], MD;  Location: [place];  Service: General;  Laterality: Right;  . PORTACATH PLACEMENT Right 05/24/2016   Procedure: INSERTION PORT-A-CATH WITH [place];  Surgeon: [name], MD;  Location: [place];  Service: General;  Laterality: Right;  .  RENAL BIOPSY  2011  . TUBAL LIGATION    . UMBILICAL HERNIA REPAIR  01/10/2012   Procedure: HERNIA REPAIR UMBILICAL ADULT;  Surgeon: Adin Hector, MD;  Location: [place];  Service: General;  Laterality: N/A;    Social History   Socioeconomic History  . Marital status: Married    Spouse name: Not on file  . Number of children: 3  . Years of education: Not on file  . Highest education level: Not on file  Occupational History  . Not on file  Tobacco Use  . Smoking status: Never Smoker  . Smokeless tobacco: Never Used  Substance and Sexual Activity  . Alcohol use: No  . Drug use: Never  . Sexual activity: Not on file  Other Topics Concern  . Not on file  Social History Narrative  . Not on file   Social Determinants of Health   Financial Resource Strain:   . Difficulty of Paying Living Expenses:   Food Insecurity:   . Worried About Charity fundraiser in the Last Year:   . Arboriculturist in the Last Year:   Transportation Needs:   . Film/video editor (Medical):   [name] Lack of Transportation (Non-Medical):   Physical Activity:   . Days of Exercise per Week:   . Minutes of Exercise per Session:   Stress:   . Feeling of Stress :   Social Connections:   . Frequency of Communication with Friends and Family:   . Frequency of Social Gatherings with Friends and Family:   . Attends Religious Services:   . Active Member of Clubs or Organizations:   . Attends Archivist Meetings:   [name] Marital Status:   Intimate Partner Violence:   . Fear of Current or Ex-Partner:   . Emotionally Abused:   [name] Physically Abused:   . Sexually Abused:     Current Outpatient Medications on File Prior to Visit  Medication Sig Dispense Refill  . acetaminophen (TYLENOL) 500 MG tablet Take 1,000 mg by mouth every 6 (six) hours as needed.    [name] amLODipine (NORVASC) 10 MG tablet Take 10 mg by mouth daily.     [name] anastrozole (ARIMIDEX) 1 MG tablet Take 1 tablet (1 mg total) by mouth daily.  (Patient taking differently: Take 1 mg by mouth daily. ) 90 tablet 4  . aspirin 81 MG tablet Take 81 mg by mouth every morning.     . cholecalciferol (VITAMIN D3) 25 MCG (1000 UNIT) tablet Take 1,000 Units by mouth daily.    . diphenhydrAMINE (BENADRYL) 25 MG tablet Take 25 mg by mouth daily as needed for allergies.    . Glucose Blood (ACCU-CHEK AVIVA PLUS VI) 1 each by Other route daily. E11.9    . lovastatin (MEVACOR) 20 MG tablet Take 20 mg by mouth every Monday, Wednesday, and Friday. Takes at bedtime.    . magnesium oxide (MAG-OX) 400 MG tablet  Take 400 mg by mouth 2 (two) times daily.    [name] oxymetazoline (AFRIN) 0.05 % nasal spray Place 1 spray into both nostrils as needed for congestion.    . potassium chloride SA (KLOR-CON) 20 MEQ tablet Take 40 mEq by mouth 2 (two) times daily.    . predniSONE (DELTASONE) 5 MG tablet Take 5 mg by mouth every morning.     . sodium chloride (OCEAN) 0.65 % SOLN nasal spray Place 1 spray into both nostrils as needed for congestion.    . tacrolimus (PROGRAF) 1 MG capsule Take by mouth 2 (two) times daily. Takes 7mg  in the AM and 6mg  in the PM.     No current facility-administered medications on file prior to visit.    No Known Allergies  Family History  Problem Relation Age of Onset  . Cancer Maternal Uncle        lung  . Cancer Cousin        breast  . Cancer Cousin        colon  . Diabetes Maternal Grandmother   . Colon cancer Neg Hx   . Colon polyps Neg Hx   . Esophageal cancer Neg Hx   . Stomach cancer Neg Hx   . Rectal cancer Neg Hx     BP 134/88   Pulse 81   Ht 5\' 5"  (1.651 m)   Wt 169 lb 9.6 oz (76.9 kg)   LMP 04/27/2016 (Exact Date)   SpO2 98%   BMI 28.22 kg/m    Review of Systems denies blurry vision, chest pain, sob, n/v, memory loss, and depression. She has lost a few lbs. She has nocturia.       Objective:   Physical Exam VS: see vs page GEN: no distress HEAD: head: no deformity eyes: no periorbital swelling, no  proptosis external nose and ears are normal NECK: a healed scar is present (PTH ectomy.  I do not appreciate a nodule in the thyroid or elsewhere in the neck. CHEST WALL: no deformity LUNGS: clear to auscultation CV: reg rate and rhythm, no murmur MUSCULOSKELETAL: muscle bulk and strength are grossly normal.  no obvious joint swelling.  gait is normal and steady EXTEMITIES: no deformity.  no ulcer on the feet.  feet are of normal color and temp.  no edema PULSES: dorsalis pedis intact bilat.  no carotid bruit NEURO:  cn 2-12 grossly intact.   readily moves all 4's.  sensation is intact to touch on the feet SKIN:  Normal texture and temperature.  No rash or suspicious lesion is visible.   NODES:  None palpable at the neck PSYCH: alert, well-oriented.  Does not appear anxious nor depressed.   Lab Results  Component Value Date   HGBA1C 7.7 (A) 06/18/2019   Lab Results  Component Value Date   CREATININE 1.52 (H) 03/25/2019   BUN 16 03/25/2019   NA 139 03/25/2019   K 3.1 (L) 03/25/2019   CL 102 03/25/2019   CO2 27 03/25/2019   I have reviewed outside records, and summarized: Pt was noted to have elevated A1c, and referred here.  She had parathyroidect 1/21.        Assessment & Plan:  Type 2 DM: She would benefit from increased rx, if it can be done with a regimen that avoids or minimizes hypoglycemia. CRI: This limits rx options.  we discussed changing glipizide repaglinide vs rybelsus.  Patient Instructions  good diet and exercise significantly improve the control of your diabetes.  please let me know if you wish to be referred to a dietician.  high blood sugar is very risky to your health.  you should see an eye doctor and dentist every year.  It is very important to get all recommended vaccinations.  Controlling your blood pressure and cholesterol drastically reduces the damage diabetes does to your body.  Those who smoke should quit.  Please discuss these with your doctor.    check your blood sugar once a day.  vary the time of day when you check, between before the 3 meals, and at bedtime.  also check if you have symptoms of your blood sugar being too high or too low.  please keep a record of the readings and bring it to your next appointment here (or you can bring the meter itself).  You can write it on any piece of paper.  please call us sooner if your blood sugar goes below 70, or if you have a lot of readings over 200. I have sent a prescription to your pharmacy, to change the glipizide to "Rybelsus."  We will need to take this complex situation in stages.   Please come back for a follow-up appointment in 1 month.

## 2019-06-20 DIAGNOSIS — E119 Type 2 diabetes mellitus without complications: Secondary | ICD-10-CM | POA: Insufficient documentation

## 2019-07-05 ENCOUNTER — Telehealth: Payer: Self-pay

## 2019-07-05 NOTE — Telephone Encounter (Signed)
FAXED [place]: [facility] for diabetic supplies Other records requested: None requested  All above requested information has been faxed successfully to [employer] listed above. Documents and fax confirmation have been placed in the faxed file for future reference.

## 2019-07-07 ENCOUNTER — Encounter (HOSPITAL_COMMUNITY): Payer: Self-pay

## 2019-07-07 ENCOUNTER — Emergency Department (HOSPITAL_COMMUNITY): Payer: No Typology Code available for payment source

## 2019-07-07 ENCOUNTER — Emergency Department (HOSPITAL_COMMUNITY)
Admission: EM | Admit: 2019-07-07 | Discharge: 2019-07-07 | Disposition: A | Discharge status: Home / Self Care | Payer: No Typology Code available for payment source | Attending: Emergency Medicine | Admitting: Emergency Medicine

## 2019-07-07 DIAGNOSIS — N186 End stage renal disease: Secondary | ICD-10-CM | POA: Diagnosis not present

## 2019-07-07 DIAGNOSIS — Z7984 Long term (current) use of oral hypoglycemic drugs: Secondary | ICD-10-CM | POA: Insufficient documentation

## 2019-07-07 DIAGNOSIS — I120 Hypertensive chronic kidney disease with stage 5 chronic kidney disease or end stage renal disease: Secondary | ICD-10-CM | POA: Insufficient documentation

## 2019-07-07 DIAGNOSIS — E1122 Type 2 diabetes mellitus with diabetic chronic kidney disease: Secondary | ICD-10-CM | POA: Insufficient documentation

## 2019-07-07 DIAGNOSIS — M25511 Pain in right shoulder: Secondary | ICD-10-CM | POA: Insufficient documentation

## 2019-07-07 DIAGNOSIS — Z940 Kidney transplant status: Secondary | ICD-10-CM | POA: Insufficient documentation

## 2019-07-07 MED ORDER — METHOCARBAMOL 500 MG PO TABS: 500.0000 mg | ORAL_TABLET | Freq: Two times a day (BID) | ORAL | 0 refills | Status: DC
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
## 2019-07-07 NOTE — ED Notes (Signed)
Ice pack provided
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
## 2019-07-07 NOTE — Discharge Instructions (Signed)
You have been seen today for a shoulder injury. There were no acute abnormalities on the x-rays, including no sign of fracture or dislocation, however, there could be injuries to the soft tissues, such as the ligaments or tendons that are not seen on xrays. There could also be what are called occult fractures that are small fractures not seen on xray. Acetaminophen: May take acetaminophen (generic for Tylenol), as needed, for pain. Your daily total maximum amount of acetaminophen from all sources should be limited to 4000mg /day for persons without liver problems, or 2000mg /day for those with liver problems. Methocarbamol: Methocarbamol (generic for Robaxin) is a muscle relaxer and can help relieve stiff muscles or muscle spasms.  Do not drive or perform other dangerous activities while taking this medication as it can cause drowsiness as well as changes in reaction time and judgement. Ice: May apply ice to the area over the next 24 hours for 15 minutes at a time to reduce swelling. Elevation: Keep the extremity elevated as often as possible to reduce pain and inflammation. Support: Wear the sling for support and comfort. Wear this until pain resolves.  Exercises: Start by performing these exercises a few times a week, increasing the frequency until you are performing them twice daily.  Follow up: Recommend follow-up with the orthopedic specialist.  Call to make an appointment. Return: Return to the ED for numbness, weakness, increasing pain, overall worsening symptoms, loss of function, or if symptoms are not improving, you have tried to follow up with the orthopedic specialist, and have been unable to do so.  For prescription assistance, may try using prescription discount sites or apps, such as goodrx.com or Good Rx smart phone app.

## 2019-07-12 ENCOUNTER — Ambulatory Visit: Payer: Medicare Other | Admitting: Endocrinology

## 2019-07-15 ENCOUNTER — Other Ambulatory Visit: Payer: Self-pay

## 2019-07-16 ENCOUNTER — Ambulatory Visit: Payer: Medicare Other | Admitting: Endocrinology

## 2019-07-17 ENCOUNTER — Ambulatory Visit (INDEPENDENT_AMBULATORY_CARE_PROVIDER_SITE_OTHER): Payer: Medicare Other | Admitting: Endocrinology

## 2019-07-17 ENCOUNTER — Other Ambulatory Visit: Payer: Self-pay

## 2019-07-17 ENCOUNTER — Encounter: Payer: Self-pay | Admitting: Endocrinology

## 2019-07-17 VITALS — BP 130/80 | HR 89 | Ht 65.0 in | Wt 165.0 lb

## 2019-07-17 DIAGNOSIS — E1121 Type 2 diabetes mellitus with diabetic nephropathy: Secondary | ICD-10-CM | POA: Diagnosis not present

## 2019-07-17 DIAGNOSIS — R1011 Right upper quadrant pain: Secondary | ICD-10-CM

## 2019-07-17 DIAGNOSIS — E210 Primary hyperparathyroidism: Secondary | ICD-10-CM

## 2019-07-17 DIAGNOSIS — N1831 Chronic kidney disease, stage 3a: Secondary | ICD-10-CM

## 2019-07-17 DIAGNOSIS — R109 Unspecified abdominal pain: Secondary | ICD-10-CM | POA: Insufficient documentation

## 2019-07-17 DIAGNOSIS — E1122 Type 2 diabetes mellitus with diabetic chronic kidney disease: Secondary | ICD-10-CM

## 2019-07-17 LAB — BASIC METABOLIC PANEL
BUN: 16 mg/dL (ref 6–23)
CO2: 31 mEq/L (ref 19–32)
Calcium: 11 mg/dL — ABNORMAL HIGH (ref 8.4–10.5)
Chloride: 102 mEq/L (ref 96–112)
Creatinine, Ser: 1.17 mg/dL (ref 0.40–1.20)
GFR: 57.48 mL/min — ABNORMAL LOW (ref >60.00)
Glucose, Bld: 128 mg/dL — ABNORMAL HIGH (ref 70–99)
Potassium: 3.2 mEq/L — ABNORMAL LOW (ref 3.5–5.1)
Sodium: 142 mEq/L (ref 135–145)

## 2019-07-17 LAB — CBC WITH DIFFERENTIAL/PLATELET
Basophils Absolute: 0 10*3/uL (ref 0.0–0.1)
Basophils Relative: 0.4 % (ref 0.0–3.0)
Eosinophils Absolute: 0.4 10*3/uL (ref 0.0–0.7)
Eosinophils Relative: 3.7 % (ref 0.0–5.0)
HCT: 40.8 % (ref 36.0–46.0)
Hemoglobin: 13.1 g/dL (ref 12.0–15.0)
Lymphocytes Relative: 25.3 % (ref 12.0–46.0)
Lymphs Abs: 2.8 10*3/uL (ref 0.7–4.0)
MCHC: 32.2 g/dL (ref 30.0–36.0)
MCV: 84.2 fl (ref 78.0–100.0)
Monocytes Absolute: 0.9 10*3/uL (ref 0.1–1.0)
Monocytes Relative: 7.8 % (ref 3.0–12.0)
Neutro Abs: 7 10*3/uL (ref 1.4–7.7)
Neutrophils Relative %: 62.8 % (ref 43.0–77.0)
Platelets: 223 10*3/uL (ref 150.0–400.0)
RBC: 4.85 Mil/uL (ref 3.87–5.11)
RDW: 14.6 % (ref 11.5–15.5)
WBC: 11.1 10*3/uL — ABNORMAL HIGH (ref 4.0–10.5)

## 2019-07-17 LAB — HEPATIC FUNCTION PANEL
ALT: 23 U/L (ref 0–35)
AST: 22 U/L (ref 0–37)
Albumin: 4.8 g/dL (ref 3.5–5.2)
Alkaline Phosphatase: 62 U/L (ref 39–117)
Bilirubin, Direct: 0.1 mg/dL (ref 0.0–0.3)
Total Bilirubin: 0.5 mg/dL (ref 0.2–1.2)
Total Protein: 8 g/dL (ref 6.0–8.3)

## 2019-07-17 LAB — VITAMIN D 25 HYDROXY (VIT D DEFICIENCY, FRACTURES): VITD: 44.42 ng/mL (ref 30.00–100.00)

## 2019-07-17 LAB — LIPASE: Lipase: 9 U/L — ABNORMAL LOW (ref 11.0–59.0)

## 2019-07-17 LAB — AMYLASE: Amylase: 67 U/L (ref 27–131)

## 2019-07-17 NOTE — Patient Instructions (Addendum)
check your blood sugar once a day.  vary the time of day when you check, between before the 3 meals, and at bedtime.  also check if you have symptoms of your blood sugar being too high or too low.  please keep a record of the readings and bring it to your next appointment here (or you can bring the meter itself).  You can write it on any piece of paper.  please call us sooner if your blood sugar goes below 70, or if you have a lot of readings over 200. Blood tests are requested for you today.  We'll let you know about the results.   Please see [name] for your symptoms.   Please come back for a follow-up appointment in 3-4 months.
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
## 2019-07-20 LAB — PTH, INTACT AND CALCIUM
Calcium: 11.3 mg/dL — ABNORMAL HIGH (ref 8.6–10.4)
PTH: 44 pg/mL (ref 14–64)

## 2019-07-20 LAB — FRUCTOSAMINE: Fructosamine: 308 umol/L — ABNORMAL HIGH (ref 205–285)

## 2019-07-20 MED ORDER — RYBELSUS 7 MG PO TABS: 7.0000 mg | ORAL_TABLET | Freq: Every day | ORAL | 3 refills | Status: DC

## 2019-07-22 ENCOUNTER — Telehealth: Payer: Self-pay

## 2019-07-22 NOTE — Telephone Encounter (Signed)
-----   Message from Renato Shin, MD sent at 07/20/2019  8:15 AM EDT ----- please contact patient: This is like and A1c of approx 7.7%. I have sent a prescription to your pharmacy, to increase the Rybelsus.  You can use up your current pills, by taking 2 per day.   The blood tests for your side pain were normal.  Please follow this up with your primary care provider The reason for your high calcium is still not known.  Please come back for a follow-up appointment in 2 months, when we'll do some other blood tests for this.

## 2019-07-22 NOTE — Telephone Encounter (Signed)
Called pt and informed about lab results as well as new orders. Verbalized acceptance and understanding. F/u appt scheduled 09/20/19 @ 8:45am.

## 2019-07-22 NOTE — Telephone Encounter (Signed)
error 

## 2019-07-28 ENCOUNTER — Other Ambulatory Visit: Payer: Self-pay | Admitting: Oncology

## 2019-08-05 ENCOUNTER — Encounter: Payer: Self-pay | Admitting: Dietician

## 2019-08-05 ENCOUNTER — Encounter: Payer: Medicare Other | Attending: Endocrinology | Admitting: Dietician

## 2019-08-05 ENCOUNTER — Other Ambulatory Visit: Payer: Self-pay

## 2019-08-05 DIAGNOSIS — E1122 Type 2 diabetes mellitus with diabetic chronic kidney disease: Secondary | ICD-10-CM

## 2019-08-05 DIAGNOSIS — E1121 Type 2 diabetes mellitus with diabetic nephropathy: Secondary | ICD-10-CM | POA: Insufficient documentation

## 2019-08-05 DIAGNOSIS — N1831 Chronic kidney disease, stage 3a: Secondary | ICD-10-CM | POA: Diagnosis present

## 2019-08-05 NOTE — Patient Instructions (Signed)
Plan:  Aim for 2-3 Carb Choices per meal (30-45 grams) +/- 1 either way  Aim for 0-1 Carbs per snack if hungry  Include protein in moderation with your meals and snacks Consider reading food labels for Total Carbohydrate of foods Continue to stay active. Consider checking BG at alternate times per day  Continue taking medication as directed by MD   You tube Opal Sidles and Torrie Mayers for easy plant based meals and snacks. Avoid phos... in the ingredient list when used as a preservative.

## 2019-08-05 NOTE — Progress Notes (Signed)
Diabetes Self-Management Education  Visit Type: First/Initial  Appt. Start Time: 0915 Appt. End Time: 1030  08/06/2019  Ms. Erika Freeman, identified by name and date of birth, is a 59 y.o. female with a diagnosis of Diabetes: Type 2.   ASSESSMENT Patient is here today with her husband.  Pt. Of Dr. Loanne Drilling.  History includes newly diagnosed type 2 diabetes, GERD, HTN, HLD, breast cancer (remission 2018), s/p kidney transplant, 2015.  Labs include GFR 57 07/17/2019, vitamin D 44, A1C 7.7% 06/18/2019 Medications include Rybelsus, prednisone  Weight hx: 163 lbs today 165 lbs 07/17/2019 170 lbs 1 /2021 Approximately 120 lbs 2018 after cancer treatment.  Patient lives with her husband.  She does the shopping and cooking.  She is on disability. She exercises regularly, does not eat fried foods and reads labels. Decreased cheese and uses nutritional yeast instead. Height 5\' 5"  (1.651 m), weight 163 lb (73.9 kg), last menstrual period 04/27/2016. Body mass index is 27.12 kg/m.  Diabetes Self-Management Education - 08/05/19 0934      Visit Information   Visit Type  First/Initial      Initial Visit   Diabetes Type  Type 2    Are you currently following a meal plan?  No    Are you taking your medications as prescribed?  Yes    Date Diagnosed  04/2019      Health Coping   How would you rate your overall health?  Good      Psychosocial Assessment   Patient Belief/Attitude about Diabetes  Motivated to manage diabetes    Self-care barriers  None    Self-management support  Doctor's office;Family    Other persons present  Patient;Spouse/SO    Patient Concerns  Nutrition/Meal planning    Special Needs  None    Preferred Learning Style  No preference indicated    Learning Readiness  Ready    How often do you need to have someone help you when you read instructions, pamphlets, or other written materials from your doctor or pharmacy?  1 - Never    What is the last grade level you  completed in school?  12      Pre-Education Assessment   Patient understands the diabetes disease and treatment process.  Needs Instruction    Patient understands incorporating nutritional management into lifestyle.  Needs Instruction    Patient undertands incorporating physical activity into lifestyle.  Needs Instruction    Patient understands using medications safely.  Needs Instruction    Patient understands monitoring blood glucose, interpreting and using results  Needs Instruction    Patient understands prevention, detection, and treatment of acute complications.  Needs Instruction    Patient understands prevention, detection, and treatment of chronic complications.  Needs Instruction    Patient understands how to develop strategies to address psychosocial issues.  Needs Instruction    Patient understands how to develop strategies to promote health/change behavior.  Needs Instruction      Complications   Last HgB A1C per patient/outside source  7.7 %   05/2019   How often do you check your blood sugar?  3-4 times / week    Fasting Blood glucose range (mg/dL)  70-129    Postprandial Blood glucose range (mg/dL)  130-179    Number of hypoglycemic episodes per month  0    Number of hyperglycemic episodes per week  0    Have you had a dilated eye exam in the past 12 months?  No  Have you had a dental exam in the past 12 months?  Yes    Are you checking your feet?  Yes    How many days per week are you checking your feet?  7      Dietary Intake   Breakfast  scrambled egg or omelet with veges OR grain berry cereal with whole milk    Snack (morning)  apple    Lunch  salad with chicken or salmon, homemade vinegrette, nuts OR vegetables with protein    Snack (afternoon)  kind bars or dark chocolate covered almonds or other nuts    Dinner  protein with vegetables, cauliflower rice    Snack (evening)  occasional popcorn    Beverage(s)  water, unsweetened tea alone or with simply lemonade,  seltzer water      Exercise   Exercise Type  Light (walking / raking leaves);Moderate (swimming / aerobic walking)   walking and jump rope or you tube   How many days per week to you exercise?  6    How many minutes per day do you exercise?  60    Total minutes per week of exercise  360      Patient Education   Previous Diabetes Education  No    Disease state   Definition of diabetes, type 1 and 2, and the diagnosis of diabetes    Nutrition management   Role of diet in the treatment of diabetes and the relationship between the three main macronutrients and blood glucose level;Food label reading, portion sizes and measuring food.;Meal options for control of blood glucose level and chronic complications.;Other (comment)   plant based eating and benefits for diabetes, kidneys and history of breast cancer.   Physical activity and exercise   Role of exercise on diabetes management, blood pressure control and cardiac health.    Medications  Reviewed patients medication for diabetes, action, purpose, timing of dose and side effects.    Monitoring  Purpose and frequency of SMBG.;Daily foot exams    Acute complications  Taught treatment of hypoglycemia - the 15 rule.;Discussed and identified patients' treatment of hyperglycemia.    Chronic complications  Relationship between chronic complications and blood glucose control    Psychosocial adjustment  Worked with patient to identify barriers to care and solutions;Role of stress on diabetes    Personal strategies to promote health  Lifestyle issues that need to be addressed for better diabetes care      Individualized Goals (developed by patient)   Nutrition  General guidelines for healthy choices and portions discussed    Physical Activity  Exercise 5-7 days per week;30 minutes per day    Medications  take my medication as prescribed    Monitoring   test my blood glucose as discussed    Reducing Risk  examine blood glucose patterns;increase portions  of healthy fats    Health Coping  discuss diabetes with (comment)   MD, RD, CDE     Post-Education Assessment   Patient understands the diabetes disease and treatment process.  Demonstrates understanding / competency    Patient understands incorporating nutritional management into lifestyle.  Demonstrates understanding / competency    Patient undertands incorporating physical activity into lifestyle.  Demonstrates understanding / competency    Patient understands using medications safely.  Demonstrates understanding / competency    Patient understands monitoring blood glucose, interpreting and using results  Demonstrates understanding / competency    Patient understands prevention, detection, and treatment of acute complications.  Demonstrates understanding / competency    Patient understands prevention, detection, and treatment of chronic complications.  Demonstrates understanding / competency    Patient understands how to develop strategies to address psychosocial issues.  Demonstrates understanding / competency    Patient understands how to develop strategies to promote health/change behavior.  Demonstrates understanding / competency      Outcomes   Expected Outcomes  Demonstrated interest in learning. Expect positive outcomes    Future DMSE  PRN    Program Status  Completed       Individualized Plan for Diabetes Self-Management Training:   Learning Objective:  Patient will have a greater understanding of diabetes self-management. Patient education plan is to attend individual and/or group sessions per assessed needs and concerns.   Plan:   Patient Instructions  Plan:  Aim for 2-3 Carb Choices per meal (30-45 grams) +/- 1 either way  Aim for 0-1 Carbs per snack if hungry  Include protein in moderation with your meals and snacks Consider reading food labels for Total Carbohydrate of foods Continue to stay active. Consider checking BG at alternate times per day  Continue taking  medication as directed by MD   You tube Opal Sidles and Torrie Mayers for easy plant based meals and snacks. Avoid phos... in the ingredient list when used as a preservative.   Expected Outcomes:  Demonstrated interest in learning. Expect positive outcomes  Education material provided: ADA - How to Thrive: A Guide for Your Journey with Diabetes, Meal plan card and Diabetes Resources  If problems or questions, patient to contact team via:  Phone and Email  Future DSME appointment: PRN

## 2019-08-06 ENCOUNTER — Other Ambulatory Visit: Payer: Self-pay | Admitting: Endocrinology

## 2019-08-06 MED ORDER — ACCU-CHEK AVIVA PLUS VI STRP: ORAL_STRIP | 12 refills | Status: AC

## 2019-08-06 NOTE — Telephone Encounter (Signed)
Medication Refill Request  Did you call your pharmacy and request this refill first? Yes  . If patient has not contacted pharmacy first, instruct them to do so for future refills.  . Remind them that contacting the pharmacy for their refill is the quickest method to get the refill.  . Refill policy also stated that it will take anywhere between 24-72 hours to receive the refill.    Name of medication? Test strips  Is this a 90 day supply? Patient unsure  Name and location of pharmacy?  [place], [place] Phone:  [phone]  Fax:  [phone]       . Is the request for diabetes test strips? yes . If yes, what brand? accu-chek aviva plus VI

## 2019-08-06 NOTE — Telephone Encounter (Signed)
RX sent

## 2019-09-11 ENCOUNTER — Other Ambulatory Visit: Payer: Self-pay

## 2019-09-11 ENCOUNTER — Encounter: Payer: Self-pay | Admitting: Sports Medicine

## 2019-09-11 ENCOUNTER — Ambulatory Visit (INDEPENDENT_AMBULATORY_CARE_PROVIDER_SITE_OTHER): Payer: Medicare Other | Admitting: Sports Medicine

## 2019-09-11 DIAGNOSIS — E119 Type 2 diabetes mellitus without complications: Secondary | ICD-10-CM | POA: Diagnosis not present

## 2019-09-11 DIAGNOSIS — M79671 Pain in right foot: Secondary | ICD-10-CM

## 2019-09-11 DIAGNOSIS — M7742 Metatarsalgia, left foot: Secondary | ICD-10-CM

## 2019-09-11 DIAGNOSIS — Q828 Other specified congenital malformations of skin: Secondary | ICD-10-CM | POA: Diagnosis not present

## 2019-09-11 DIAGNOSIS — M2141 Flat foot [pes planus] (acquired), right foot: Secondary | ICD-10-CM

## 2019-09-11 DIAGNOSIS — M7741 Metatarsalgia, right foot: Secondary | ICD-10-CM

## 2019-09-11 DIAGNOSIS — M79672 Pain in left foot: Secondary | ICD-10-CM

## 2019-09-11 DIAGNOSIS — M2040 Other hammer toe(s) (acquired), unspecified foot: Secondary | ICD-10-CM

## 2019-09-11 DIAGNOSIS — M2142 Flat foot [pes planus] (acquired), left foot: Secondary | ICD-10-CM

## 2019-09-11 NOTE — Progress Notes (Signed)
Subjective: Erika Freeman is a 59 y.o. female patient who presents to office for evaluation of Right> Left foot pain secondary to callus skin. Patient complains of pain at the lesion present Right>Left foot at the ball of the foot. Patient has tried self trimming that used to help however because she is diabetic her doctor advised her to refrain from doing so.  Patient last blood sugar was 99 last A1c unknown and last visit to PCP Sabra Heck was last week with no relief in symptoms. Patient denies any other pedal complaints.   Review of Systems  All other systems reviewed and are negative.    Patient Active Problem List   Diagnosis Date Noted  . Abdominal pain 07/17/2019  . Diabetes ([place]) 06/20/2019  . Hyperparathyroidism, primary ([place]) 03/24/2019  . Hypokalemia 05/10/2017  . Dysuria 04/12/2017  . Renal transplant rejection 02/24/2017  . Malignant neoplasm of female breast ([place]) 05/16/2016  . Hypomagnesemia 02/27/2014  . Hypophosphatemia 02/20/2014  . Immunosuppressive management encounter following kidney transplant 02/20/2014  . 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 Immunosuppression ([place]) 02/16/2014  . Essential hypertension 07/18/2012  . H/O cesarean section 07/18/2012  . Encounter for monitoring tacrolimus therapy 07/18/2012  . End-stage renal disease on peritoneal dialysis ([place]) 07/18/2012  . Umbilical hernia s/p primary repair 01/10/2012 01/24/2012  . Constipation, chronic 01/24/2012  . FSGS (focal segmental glomerulosclerosis) 11/23/2011  . Uterine fibromyoma 11/23/2011  . Chronic kidney disease, stage V (very severe) ([place])     Current Outpatient Medications on File Prior to Visit  Medication Sig Dispense Refill  . amLODipine (NORVASC) 10 MG tablet Take 10 mg by mouth daily.     [name] anastrozole (ARIMIDEX) 1 MG tablet TAKE 1 TABLET(1 MG) BY MOUTH DAILY 90 tablet 4  . aspirin 81 MG tablet Take 81 mg by mouth every morning.     [name] atorvastatin (LIPITOR) 10  MG tablet Take 10 mg by mouth 3 (three) times a week.    . cholecalciferol (VITAMIN D3) 25 MCG (1000 UNIT) tablet Take 1,000 Units by mouth daily.    [name] glucose blood (ACCU-CHEK AVIVA PLUS) test strip Use to check blood sugar once a day. 100 each 12  . potassium chloride SA (KLOR-CON) 20 MEQ tablet Take 40 mEq by mouth 2 (two) times daily.    . predniSONE (DELTASONE) 5 MG tablet Take 5 mg by mouth every morning.     . Semaglutide (RYBELSUS) 7 MG TABS Take 7 mg by mouth daily. 90 tablet 3  . tacrolimus (PROGRAF) 1 MG capsule Take by mouth 2 (two) times daily. Takes 7mg  in the AM and 6mg  in the PM.     No current facility-administered medications on file prior to visit.    No Known Allergies  Objective:  General: Alert and oriented x3 in no acute distress  Dermatology: Keratotic lesion present submet 3/4 bilateral with skin lines transversing the lesion, pain is present with direct pressure to the lesion with a central nucleated core noted, no webspace macerations, no ecchymosis bilateral, all nails x 10 are well manicured.  Vascular: Dorsalis Pedis and Posterior Tibial pedal pulses 1/4, Capillary Fill Time 3 seconds, + pedal hair growth bilateral, no edema bilateral lower extremities, Temperature gradient within normal limits.  Neurology: Gross sensation intact via light touch bilateral.  Musculoskeletal: Mild tenderness with palpation at the keratotic lesion site on Right>Left, hammertoe, pes planus noted, muscular strength 5/5 in all groups without pain or limitation on range of motion.  Assessment  and Plan: Problem List Items Addressed This Visit    None    Visit Diagnoses    Porokeratosis    -  Primary   Diabetes mellitus without complication (HCC)       Relevant Medications   atorvastatin (LIPITOR) 10 MG tablet   Hammer toe, unspecified laterality       Metatarsalgia of both feet       Pes planus of both feet       Foot pain, bilateral          -Complete examination  performed -Discussed treatment options for keratosis in the setting of diabetes -Parred keratoic lesion using a chisel x2 treated the area withSalinocaine covered with Band-Aid -Encouraged daily skin emollients gave sample of foot miracle cream -Encouraged use of pumice stone -Advised good supportive shoes and inserts patient may benefit from diabetic insoles and shoes -Patient to return to office as scheduled or sooner if condition worsens.  Landis Martins, DPM

## 2019-09-16 ENCOUNTER — Other Ambulatory Visit: Payer: Self-pay | Admitting: Oncology

## 2019-09-16 DIAGNOSIS — Z9889 Other specified postprocedural states: Secondary | ICD-10-CM

## 2019-09-20 ENCOUNTER — Ambulatory Visit (INDEPENDENT_AMBULATORY_CARE_PROVIDER_SITE_OTHER): Payer: Medicare Other | Admitting: Endocrinology

## 2019-09-20 ENCOUNTER — Encounter: Payer: Self-pay | Admitting: Endocrinology

## 2019-09-20 ENCOUNTER — Other Ambulatory Visit: Payer: Self-pay

## 2019-09-20 VITALS — BP 138/86 | HR 90 | Ht 65.0 in | Wt 159.0 lb

## 2019-09-20 DIAGNOSIS — E1121 Type 2 diabetes mellitus with diabetic nephropathy: Secondary | ICD-10-CM | POA: Diagnosis not present

## 2019-09-20 DIAGNOSIS — N1831 Chronic kidney disease, stage 3a: Secondary | ICD-10-CM | POA: Diagnosis not present

## 2019-09-20 LAB — POCT GLYCOSYLATED HEMOGLOBIN (HGB A1C): Hemoglobin A1C: 5.7 % — AB (ref 4.0–5.6)

## 2019-09-20 MED ORDER — RYBELSUS 3 MG PO TABS: 3.0000 mg | ORAL_TABLET | Freq: Every day | ORAL | 11 refills | Status: DC

## 2019-09-20 NOTE — Patient Instructions (Addendum)
check your blood sugar once a day.  vary the time of day when you check, between before the 3 meals, and at bedtime.  also check if you have symptoms of your blood sugar being too high or too low.  please keep a record of the readings and bring it to your next appointment here (or you can bring the meter itself).  You can write it on any piece of paper.  please call us sooner if your blood sugar goes below 70, or if you have a lot of readings over 200. Please reduce the Rybelsus to 3 mg per day. Please come back for a follow-up appointment in 4 months.

## 2019-09-20 NOTE — Progress Notes (Signed)
Subjective:    Patient ID: Erika Freeman, female    DOB: Dec 14, 1960, 59 y.o.   MRN: [id number]  HPI Pt returns for f/u of diabetes mellitus:  DM type: 2 Dx'ed: 5462 Complications: CRI (transplant 2015).    Therapy: Rybelsus GDM: never DKA: never Severe hypoglycemia: never.   Pancreatitis: never Pancreatic imaging: never.   SDOH: none. Other: she has never been on insulin; CRI limits rx options.   Interval history: pt says cbg's are well-controlled.  Main symptom is belching.   Past Medical History:  Diagnosis Date  . Anemia   . CKD (chronic kidney disease), stage III    nephologist-- dr Ria Comment [facility])  and transplant clinic [facility]  . Diabetes mellitus without complication ([place])   . Diverticulosis of colon   . GERD (gastroesophageal reflux disease)    03-21-2019 -- per pt w/ spicy foods and at night time occasionally,  chews watermelon gum and pt states it resolves  . Heart murmur    03-21-2019  per pt was told had murmur some years ago, pt is asymptomatic (last echo in [facility] 05-31-2016  trivial regurg MR and TR)  . History of end stage renal disease    secondary to FSGS with PD until kidney transplant 12/ 2015  . History of radiation therapy 12/20/16  to 01/17/17   left breast 50.4 gy in 28 fractions  . Hypercalcemia    pt had recent hospital admision @[facility]  (in care everywhere) for acute kidney injury due to hypercalcemia and hypomagnesium,  resolved with medication, lov w/ transplant clinic @[facility]  02-14-2019  calcium  normal per lab (care everywhere)   . Hypertension    followed by transplant clinic @[facility] ---  (pt had stress echo 07-11-2013, negative for ischemia, in care everywhere)  . Hypokalemia    pt hospital admission   . Hypomagnesemia   . Immunosuppression ([place])    due to kidney transplant 02-12-2014  . Kidney transplant status, living unrelated donor followed by transplant clinic @[facility] -- Dr Delane Ginger. [id number]   02-12-2014  @[facility]   . Malignant  neoplasm of breast (female) [facility]) oncologist--- dr Jana Hakim (lov in epic no recurrence 01-18-2019)   dx 03/ 2018  IDC Stage IIB ---  completed chemotherapy 10-11-2016;  s/p left breast lumpectomy with node dissection 11-14-2016;  completed radiation 01-17-2017  . Personal history of chemotherapy    left breast cancer--  06-01-2016  to 10-11-2016  . Primary hyperparathyroidism ([place])    12/ 2020 w/ hypercalcimia  (pt has hx of hyperparathyoidism s/p right inferior parathyroidectomy 11-27-2006 @[facility] ;  per path adenoma)  . Uterine fibroid     Past Surgical History:  Procedure Laterality Date  . BREAST BIOPSY    . BREAST LUMPECTOMY Left    2018  . BREAST LUMPECTOMY WITH RADIOACTIVE SEED AND SENTINEL LYMPH NODE BIOPSY Left 11/14/2016   Procedure: LEFT BREAST RADIOACTIVE SEED BRACKETED LUMPECTOMY, LEFT AXILLARY SENTINEL LYMPH NODE BIOPSY WITH BLUE DYE INJECTION;  Surgeon: [name], MD;  Location: [place];  Service: General;  Laterality: Left;  . BREAST SURGERY  2012   left - fibroadenoma  . CAPD INSERTION  01/10/2012   Procedure: LAPAROSCOPIC INSERTION CONTINUOUS AMBULATORY PERITONEAL DIALYSIS  (CAPD) CATHETER;  Surgeon: Adin Hector, MD;  Location: [place];  Service: General;  Laterality: N/A;  LAPAROSCOPIC CAPD CATHETER PLACEMENT OMENTOPEXY  . CESAREAN SECTION  1995  . COLONOSCOPY    . DILATION AND CURETTAGE OF UTERUS  2000  . KIDNEY TRANSPLANT    .  PARATHYROIDECTOMY Right 11-27-2006   dr gerkin @[facility]    right inferior  (adenoma)  . PARATHYROIDECTOMY Right 03/25/2019   Procedure: RIGHT SUPERIOR PARATHYROIDECTOMY;  Surgeon: Armandina Gemma, MD;  Location: [place];  Service: General;  Laterality: Right;  . PORT-A-CATH REMOVAL Right 11/14/2016   Procedure: REMOVAL PORT-A-CATH;  Surgeon: [name], MD;  Location: [place];  Service: General;  Laterality: Right;  . PORTACATH PLACEMENT Right 05/24/2016   Procedure: INSERTION PORT-A-CATH WITH [place];  Surgeon: [name],  MD;  Location: [place];  Service: General;  Laterality: Right;  . RENAL BIOPSY  2011  . TUBAL LIGATION    . UMBILICAL HERNIA REPAIR  01/10/2012   Procedure: HERNIA REPAIR UMBILICAL ADULT;  Surgeon: Adin Hector, MD;  Location: [place];  Service: General;  Laterality: N/A;    Social History   Socioeconomic History  . Marital status: Married    Spouse name: Not on file  . Number of children: 3  . Years of education: Not on file  . Highest education level: Not on file  Occupational History  . Not on file  Tobacco Use  . Smoking status: Never Smoker  . Smokeless tobacco: Never Used  Vaping Use  . Vaping Use: Never used  Substance and Sexual Activity  . Alcohol use: No  . Drug use: Never  . Sexual activity: Not on file  Other Topics Concern  . Not on file  Social History Narrative  . Not on file   Social Determinants of Health   Financial Resource Strain:   . Difficulty of Paying Living Expenses:   Food Insecurity:   . Worried About Charity fundraiser in the Last Year:   . Arboriculturist in the Last Year:   Transportation Needs:   . Film/video editor (Medical):   [name] Lack of Transportation (Non-Medical):   Physical Activity:   . Days of Exercise per Week:   . Minutes of Exercise per Session:   Stress:   . Feeling of Stress :   Social Connections:   . Frequency of Communication with Friends and Family:   . Frequency of Social Gatherings with Friends and Family:   . Attends Religious Services:   . Active Member of Clubs or Organizations:   . Attends Archivist Meetings:   [name] Marital Status:   Intimate Partner Violence:   . Fear of Current or Ex-Partner:   . Emotionally Abused:   [name] Physically Abused:   . Sexually Abused:     Current Outpatient Medications on File Prior to Visit  Medication Sig Dispense Refill  . amLODipine (NORVASC) 10 MG tablet Take 10 mg by mouth daily.     [name] anastrozole (ARIMIDEX) 1 MG tablet TAKE 1 TABLET(1 MG) BY MOUTH DAILY 90  tablet 4  . aspirin 81 MG tablet Take 81 mg by mouth every morning.     [name] atorvastatin (LIPITOR) 10 MG tablet Take 10 mg by mouth 3 (three) times a week.    . cholecalciferol (VITAMIN D3) 25 MCG (1000 UNIT) tablet Take 1,000 Units by mouth daily.    [name] glucose blood (ACCU-CHEK AVIVA PLUS) test strip Use to check blood sugar once a day. 100 each 12  . potassium chloride SA (KLOR-CON) 20 MEQ tablet Take 40 mEq by mouth 2 (two) times daily.    . predniSONE (DELTASONE) 5 MG tablet Take 5 mg by mouth every morning.     . tacrolimus (PROGRAF) 1 MG capsule Take  by mouth 2 (two) times daily. Takes 7mg  in the AM and 6mg  in the PM.     No current facility-administered medications on file prior to visit.    No Known Allergies  Family History  Problem Relation Age of Onset  . Cancer Maternal Uncle        lung  . Cancer Cousin        breast  . Cancer Cousin        colon  . Diabetes Maternal Grandmother   . Colon cancer Neg Hx   . Colon polyps Neg Hx   . Esophageal cancer Neg Hx   . Stomach cancer Neg Hx   . Rectal cancer Neg Hx     BP (!) 138/86   Pulse 90   Ht 5\' 5"  (1.651 m)   Wt 159 lb (72.1 kg)   LMP 04/27/2016 (Exact Date)   SpO2 97%   BMI 26.46 kg/m    Review of Systems Denies n/v.      Objective:   Physical Exam VITAL SIGNS:  See vs page.   GENERAL: no distress Pulses: dorsalis pedis intact bilat.   MSK: no deformity of the feet CV: no leg edema Skin:  no ulcer on the feet.  normal color and temp on the feet. Neuro: sensation is intact to touch on the feet.     Lab Results  Component Value Date   HGBA1C 5.7 (A) 09/20/2019       Assessment & Plan:  Type 2 DM: well-controlled Belching, due to Rybelsus  Patient Instructions  check your blood sugar once a day.  vary the time of day when you check, between before the 3 meals, and at bedtime.  also check if you have symptoms of your blood sugar being too high or too low.  please keep a record of the readings and  bring it to your next appointment here (or you can bring the meter itself).  You can write it on any piece of paper.  please call us sooner if your blood sugar goes below 70, or if you have a lot of readings over 200. Please reduce the Rybelsus to 3 mg per day. Please come back for a follow-up appointment in 4 months.

## 2019-10-01 ENCOUNTER — Other Ambulatory Visit: Payer: Medicare Other

## 2019-10-01 ENCOUNTER — Other Ambulatory Visit: Payer: Self-pay

## 2019-10-28 ENCOUNTER — Ambulatory Visit
Admission: RE | Admit: 2019-10-28 | Discharge: 2019-10-28 | Disposition: A | Discharge status: Home / Self Care | Payer: Medicare Other | Source: Ambulatory Visit | Attending: Oncology | Admitting: Oncology

## 2019-10-28 ENCOUNTER — Other Ambulatory Visit: Payer: Self-pay

## 2019-10-28 DIAGNOSIS — Z9889 Other specified postprocedural states: Secondary | ICD-10-CM

## 2019-11-05 ENCOUNTER — Ambulatory Visit: Payer: Medicare Other | Admitting: Oncology

## 2019-11-05 ENCOUNTER — Other Ambulatory Visit: Payer: Medicare Other

## 2019-11-12 ENCOUNTER — Other Ambulatory Visit: Payer: Self-pay | Admitting: *Deleted

## 2019-11-12 DIAGNOSIS — C50919 Malignant neoplasm of unspecified site of unspecified female breast: Secondary | ICD-10-CM

## 2019-11-13 ENCOUNTER — Inpatient Hospital Stay (HOSPITAL_BASED_OUTPATIENT_CLINIC_OR_DEPARTMENT_OTHER): Payer: Medicare Other | Admitting: Adult Health

## 2019-11-13 ENCOUNTER — Inpatient Hospital Stay: Payer: Medicare Other | Attending: Adult Health

## 2019-11-13 ENCOUNTER — Other Ambulatory Visit: Payer: Self-pay

## 2019-11-13 ENCOUNTER — Encounter: Payer: Self-pay | Admitting: Adult Health

## 2019-11-13 VITALS — BP 124/87 | HR 96 | Temp 97.8°F | Resp 18 | Ht 65.0 in | Wt 155.8 lb

## 2019-11-13 DIAGNOSIS — E1122 Type 2 diabetes mellitus with diabetic chronic kidney disease: Secondary | ICD-10-CM | POA: Diagnosis not present

## 2019-11-13 DIAGNOSIS — C50812 Malignant neoplasm of overlapping sites of left female breast: Secondary | ICD-10-CM | POA: Insufficient documentation

## 2019-11-13 DIAGNOSIS — N186 End stage renal disease: Secondary | ICD-10-CM | POA: Insufficient documentation

## 2019-11-13 DIAGNOSIS — Z79811 Long term (current) use of aromatase inhibitors: Secondary | ICD-10-CM | POA: Diagnosis not present

## 2019-11-13 DIAGNOSIS — I120 Hypertensive chronic kidney disease with stage 5 chronic kidney disease or end stage renal disease: Secondary | ICD-10-CM | POA: Insufficient documentation

## 2019-11-13 DIAGNOSIS — E2839 Other primary ovarian failure: Secondary | ICD-10-CM | POA: Diagnosis not present

## 2019-11-13 DIAGNOSIS — Z940 Kidney transplant status: Secondary | ICD-10-CM | POA: Insufficient documentation

## 2019-11-13 DIAGNOSIS — Z79899 Other long term (current) drug therapy: Secondary | ICD-10-CM | POA: Insufficient documentation

## 2019-11-13 DIAGNOSIS — C50919 Malignant neoplasm of unspecified site of unspecified female breast: Secondary | ICD-10-CM | POA: Diagnosis not present

## 2019-11-13 DIAGNOSIS — Z170 Estrogen receptor positive status [ER+]: Secondary | ICD-10-CM | POA: Diagnosis not present

## 2019-11-13 LAB — CMP (CANCER CENTER ONLY)
ALT: 18 U/L (ref 0–44)
AST: 18 U/L (ref 15–41)
Albumin: 4.1 g/dL (ref 3.5–5.0)
Alkaline Phosphatase: 92 U/L (ref 38–126)
Anion gap: 8 (ref 5–15)
BUN: 14 mg/dL (ref 6–20)
CO2: 28 mmol/L (ref 22–32)
Calcium: 10.2 mg/dL (ref 8.9–10.3)
Chloride: 106 mmol/L (ref 98–111)
Creatinine: 1.26 mg/dL — ABNORMAL HIGH (ref 0.44–1.00)
GFR, Est AFR Am: 54 mL/min — ABNORMAL LOW (ref >60)
GFR, Estimated: 47 mL/min — ABNORMAL LOW (ref >60)
Glucose, Bld: 174 mg/dL — ABNORMAL HIGH (ref 70–99)
Potassium: 3.5 mmol/L (ref 3.5–5.1)
Sodium: 142 mmol/L (ref 135–145)
Total Bilirubin: 0.4 mg/dL (ref 0.3–1.2)
Total Protein: 7.7 g/dL (ref 6.5–8.1)

## 2019-11-13 LAB — CBC WITH DIFFERENTIAL (CANCER CENTER ONLY)
Abs Immature Granulocytes: 0.05 10*3/uL (ref 0.00–0.07)
Basophils Absolute: 0 10*3/uL (ref 0.0–0.1)
Basophils Relative: 0 %
Eosinophils Absolute: 0.1 10*3/uL (ref 0.0–0.5)
Eosinophils Relative: 1 %
HCT: 42.3 % (ref 36.0–46.0)
Hemoglobin: 13.2 g/dL (ref 12.0–15.0)
Immature Granulocytes: 0 %
Lymphocytes Relative: 13 %
Lymphs Abs: 1.6 10*3/uL (ref 0.7–4.0)
MCH: 26.9 pg (ref 26.0–34.0)
MCHC: 31.2 g/dL (ref 30.0–36.0)
MCV: 86.2 fL (ref 80.0–100.0)
Monocytes Absolute: 0.4 10*3/uL (ref 0.1–1.0)
Monocytes Relative: 3 %
Neutro Abs: 10.5 10*3/uL — ABNORMAL HIGH (ref 1.7–7.7)
Neutrophils Relative %: 83 %
Platelet Count: 206 10*3/uL (ref 150–400)
RBC: 4.91 MIL/uL (ref 3.87–5.11)
RDW: 13.5 % (ref 11.5–15.5)
WBC Count: 12.7 10*3/uL — ABNORMAL HIGH (ref 4.0–10.5)
nRBC: 0 % (ref 0.0–0.2)

## 2019-11-13 NOTE — Patient Instructions (Signed)

## 2019-11-13 NOTE — Progress Notes (Signed)
[place]  [phone]) [phone] Fax:(336) [phone]     ID: Erika Freeman DOB: [birth year]  MR#: [id number]  JSH#:[id number]  Patient Care Team: Scheryl Marten, [place] as PCP - General (Internal Medicine) [name], MD as Consulting Physician (Nephrology) [name], MD as Consulting Physician (Obstetrics and Gynecology) [name], MD as Consulting Physician (General Surgery) Magrinat, [name], MD as Consulting Physician (Oncology) [name], MD as Consulting Physician (Radiation Oncology) [name], MD as Referring Physician (Surgery) [name], DO (Internal Medicine) OTHER MD:  CHIEF COMPLAINT: Estrogen receptor positive breast cancer  CURRENT TREATMENT: anastrozole    INTERVAL HISTORY:  Erika Freeman resturns today for follow-up of her estrogen receptor positive breast cancer unaccompanied.  She continues on anastrozole daily with good tolerance.  Her most recent mammogram was completed on 10/28/2019 and showed no evidence of malignancy.  She has breast density category C.    REVIEW OF SYSTEMS: Erika Freeman has had health issues with hypercalcemia and diabetes this year.  She has made some dietary changes, along with decreased carbohydrates, decreased calcium intake, and sodas.  She is exercising by walking regularly.    Erika Freeman is seeing her PCP regularly.  She is up to date with cancer screenings.  A detailed ROS Was otherwise non contributory today.  BREAST CANCER HISTORY: From the original intake note:  "Erika Freeman" herself noted a mass in her left breast. She ignored it because she thought it was related to menstruation. However as it persisted through 1. She brought it to Dr. [name] attention and on 05/10/2016 she underwent left diagnostic mammography with tomography [facility] The breast density was category C. In the left breast lower central area there was a new circumscribed  mass. This was firm and fixed in the lower inner left breast at middle depth. Ultrasonography confirmed a 2.6 cm hypoechoic irregular mass at the 6:30 o'clock radiant 3 cm from the nipple. There was also a 0.5 cm nodule 1.4 cm medial to the mass just described.  Biopsy of the left breast mass in question 05/10/2016 showed([id number]) invasive ductal carcinoma, grade 3, estrogen receptor 70% positive with weak staining intensity, progesterone receptor negative, with no HER-2 amplification, the signals ratio being 1.52 and the number per cell 2.35. The proliferation marker was 90%.  Her subsequent history is as detailed below  OF NOTE: The patient has a history of focal segmental glomerular sclerosis with end-stage renal disease and is status post unrelated donor renal transplant 04/15/2013 as part of appeared donor exchange with her husband donating one kidney, the other harvested from a 58 year old [place] man. She continues on immunosuppression with mycophenolate and tacrolimus.   PAST MEDICAL HISTORY: Past Medical History:  Diagnosis Date  . Anemia   . CKD (chronic kidney disease), stage III    nephologist-- dr Ria Comment [facility])  and transplant clinic [facility]  . Diabetes mellitus without complication ([place])   . Diverticulosis of colon   . GERD (gastroesophageal reflux disease)    03-21-2019 -- per pt w/ spicy foods and at night time occasionally,  chews watermelon gum and pt states it resolves  . Heart murmur    03-21-2019  per pt was told had murmur some years ago, pt is asymptomatic (last echo in [facility] 05-31-2016  trivial regurg MR and TR)  . History of end stage renal disease    secondary to FSGS with PD until kidney transplant 12/ 2015  . History of  radiation therapy 12/20/16  to 01/17/17   left breast 50.4 gy in 28 fractions  . Hypercalcemia    pt had recent hospital admision @[facility]  (in care everywhere) for acute kidney injury due to hypercalcemia and hypomagnesium,   resolved with medication, lov w/ transplant clinic @[facility]  02-14-2019  calcium  normal per lab (care everywhere)   . Hypertension    followed by transplant clinic @[facility] ---  (pt had stress echo 07-11-2013, negative for ischemia, in care everywhere)  . Hypokalemia    pt hospital admission   . Hypomagnesemia   . Immunosuppression ([place])    due to kidney transplant 02-12-2014  . Kidney transplant status, living unrelated donor followed by transplant clinic @[facility] -- Dr Delane Ginger. [id number]   02-12-2014  @[facility]   . Malignant neoplasm of breast (female) [facility]) oncologist--- dr Jana Hakim (lov in epic no recurrence 01-18-2019)   dx 03/ 2018  IDC Stage IIB ---  completed chemotherapy 10-11-2016;  s/p left breast lumpectomy with node dissection 11-14-2016;  completed radiation 01-17-2017  . Personal history of chemotherapy    left breast cancer--  06-01-2016  to 10-11-2016  . Personal history of radiation therapy   . Primary hyperparathyroidism ([place])    12/ 2020 w/ hypercalcimia  (pt has hx of hyperparathyoidism s/p right inferior parathyroidectomy 11-27-2006 @[facility] ;  per path adenoma)  . Uterine fibroid     PAST SURGICAL HISTORY: Past Surgical History:  Procedure Laterality Date  . BREAST BIOPSY    . BREAST LUMPECTOMY Left    2018  . BREAST LUMPECTOMY WITH RADIOACTIVE SEED AND SENTINEL LYMPH NODE BIOPSY Left 11/14/2016   Procedure: LEFT BREAST RADIOACTIVE SEED BRACKETED LUMPECTOMY, LEFT AXILLARY SENTINEL LYMPH NODE BIOPSY WITH BLUE DYE INJECTION;  Surgeon: [name], MD;  Location: [place];  Service: General;  Laterality: Left;  . BREAST SURGERY  2012   left - fibroadenoma  . CAPD INSERTION  01/10/2012   Procedure: LAPAROSCOPIC INSERTION CONTINUOUS AMBULATORY PERITONEAL DIALYSIS  (CAPD) CATHETER;  Surgeon: Adin Hector, MD;  Location: [place];  Service: General;  Laterality: N/A;  LAPAROSCOPIC CAPD CATHETER PLACEMENT OMENTOPEXY  . CESAREAN SECTION  1995  . COLONOSCOPY    . DILATION AND CURETTAGE OF  UTERUS  2000  . KIDNEY TRANSPLANT    . PARATHYROIDECTOMY Right 11-27-2006   dr gerkin @[facility]    right inferior  (adenoma)  . PARATHYROIDECTOMY Right 03/25/2019   Procedure: RIGHT SUPERIOR PARATHYROIDECTOMY;  Surgeon: Armandina Gemma, MD;  Location: [place];  Service: General;  Laterality: Right;  . PORT-A-CATH REMOVAL Right 11/14/2016   Procedure: REMOVAL PORT-A-CATH;  Surgeon: [name], MD;  Location: [place];  Service: General;  Laterality: Right;  . PORTACATH PLACEMENT Right 05/24/2016   Procedure: INSERTION PORT-A-CATH WITH [place];  Surgeon: [name], MD;  Location: [place];  Service: General;  Laterality: Right;  . RENAL BIOPSY  2011  . TUBAL LIGATION    . UMBILICAL HERNIA REPAIR  01/10/2012   Procedure: HERNIA REPAIR UMBILICAL ADULT;  Surgeon: Adin Hector, MD;  Location: [place];  Service: General;  Laterality: N/A;    FAMILY HISTORY Family History  Problem Relation Age of Onset  . Cancer Maternal Uncle        lung  . Cancer Cousin        colon  . Diabetes Maternal Grandmother   . Colon cancer Neg Hx   . Colon polyps Neg Hx   . Esophageal cancer Neg Hx   . Stomach cancer Neg Hx   . Rectal  cancer Neg Hx   The patient has very little information regarding her father. Her mother is living, 57 years old as of March 2018. The patient had one full brother, diagnosed with prostate cancer in his 49s. The patient has 2 half-brothers and 1 half-sister. There is no history of breast or ovarian cancer in the family to the patient's knowledge    GYNECOLOGIC HISTORY:  Patient's last menstrual period was 04/27/2016 (exact date).  Menarche age 24, first live birth age 75, the patient is GX P3. Her periods are regular, the last 5 or 6 days, with no heavy days. 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 Her husband white works at home [employer]. Son [name] lives in [place] and as  Freight forwarder of a Therapist, art. Son [name] lives in nights [place] and is a Air traffic controller. Son [name] lives in [place] and is studying based trombone. The patient has 2 grandchildren. She attends a local [employer]:  not in place    HEALTH MAINTENANCE: Social History   Tobacco Use  . Smoking status: Never Smoker  . Smokeless tobacco: Never Used  Vaping Use  . Vaping Use: Never used  Substance Use Topics  . Alcohol use: No  . Drug use: Never     Colonoscopy:  PAP:  Bone density:   No Known Allergies  Current Outpatient Medications  Medication Sig Dispense Refill  . amLODipine (NORVASC) 10 MG tablet Take 10 mg by mouth daily.     [name] anastrozole (ARIMIDEX) 1 MG tablet TAKE 1 TABLET(1 MG) BY MOUTH DAILY 90 tablet 4  . aspirin 81 MG tablet Take 81 mg by mouth every morning.     [name] atorvastatin (LIPITOR) 10 MG tablet Take 10 mg by mouth 3 (three) times a week.    . cholecalciferol (VITAMIN D3) 25 MCG (1000 UNIT) tablet Take 1,000 Units by mouth daily.    [name] glucose blood (ACCU-CHEK AVIVA PLUS) test strip Use to check blood sugar once a day. 100 each 12  . potassium chloride SA (KLOR-CON) 20 MEQ tablet Take 40 mEq by mouth 2 (two) times daily.    . predniSONE (DELTASONE) 5 MG tablet Take 5 mg by mouth every morning.     . RYBELSUS 7 MG TABS Take 1 tablet by mouth daily.    . Semaglutide (RYBELSUS) 3 MG TABS Take 3 mg by mouth daily. 30 tablet 11  . tacrolimus (PROGRAF) 1 MG capsule Take by mouth 2 (two) times daily. Takes 53m in the AM and 662min the PM.     No current facility-administered medications for this visit.    OBJECTIVE:  Vitals:   11/13/19 1428  BP: 124/87  Pulse: 96  Resp: 18  Temp: 97.8 F (36.6 C)  SpO2: 99%     Body mass index is 25.93 kg/m.  Filed Weights   11/13/19 1428  Weight: 155 lb 12.8 oz (70.7 kg)   Filed Weights   11/13/19 1428  Weight: 155 lb 12.8 oz (70.7 kg)   ECOG  FS:1 - Symptomatic but completely ambulatory  GENERAL: Patient is a well appearing female in no acute distress HEENT:  Sclerae anicteric.  Mask in place.   Neck is supple.  NODES:  No cervical, supraclavicular, or axillary lymphadenopathy palpated.  BREAST EXAM:  Left breast s/p lumpectomy and radiation, no sign of local recurrence, right  breast benign LUNGS:  Clear to auscultation bilaterally.  No wheezes or rhonchi. HEART:  Regular rate and rhythm. No murmur appreciated. ABDOMEN:  Soft, nontender.  Positive, normoactive bowel sounds. No organomegaly palpated. MSK:  No focal spinal tenderness to palpation. Full range of motion bilaterally in the upper extremities. EXTREMITIES:  No peripheral edema.   SKIN:  Clear with no obvious rashes or skin changes. No nail dyscrasia. NEURO:  Nonfocal. Well oriented.  Appropriate affect.     LAB RESULTS:  CMP     Component Value Date/Time   NA 142 07/17/2019 0850   NA 142 02/14/2017 1012   K 3.2 (L) 07/17/2019 0850   K 2.8 (LL) 02/14/2017 1012   CL 102 07/17/2019 0850   CO2 31 07/17/2019 0850   CO2 30 (H) 02/14/2017 1012   GLUCOSE 128 (H) 07/17/2019 0850   GLUCOSE 104 02/14/2017 1012   BUN 16 07/17/2019 0850   BUN 14.1 02/14/2017 1012   CREATININE 1.17 07/17/2019 0850   CREATININE 1.23 (H) 01/29/2019 1023   CREATININE 1.2 (H) 02/14/2017 1012   CALCIUM 11.0 (H) 07/17/2019 0850   CALCIUM 11.3 (H) 07/17/2019 0850   CALCIUM 9.6 02/14/2017 1012   PROT 8.0 07/17/2019 0850   PROT 7.8 02/14/2017 1012   ALBUMIN 4.8 07/17/2019 0850   ALBUMIN 4.4 02/14/2017 1012   AST 22 07/17/2019 0850   AST 20 01/29/2019 1023   AST 15 02/14/2017 1012   ALT 23 07/17/2019 0850   ALT 31 01/29/2019 1023   ALT 15 02/14/2017 1012   ALKPHOS 62 07/17/2019 0850   ALKPHOS 72 02/14/2017 1012   BILITOT 0.5 07/17/2019 0850   BILITOT 0.4 01/29/2019 1023   BILITOT 0.53 02/14/2017 1012   GFRNONAA 38 (L) 03/25/2019 1021   GFRNONAA 49 (L) 01/29/2019 1023   GFRAA 44  (L) 03/25/2019 1021   GFRAA 56 (L) 01/29/2019 1023    No results found for: TOTALPROTELP, ALBUMINELP, A1GS, A2GS, BETS, BETA2SER, GAMS, MSPIKE, SPEI  No results found for: Nils Pyle, [facility]  Lab Results  Component Value Date   WBC 12.7 (H) 11/13/2019   NEUTROABS 10.5 (H) 11/13/2019   HGB 13.2 11/13/2019   HCT 42.3 11/13/2019   MCV 86.2 11/13/2019   PLT 206 11/13/2019      Chemistry      Component Value Date/Time   NA 142 07/17/2019 0850   NA 142 02/14/2017 1012   K 3.2 (L) 07/17/2019 0850   K 2.8 (LL) 02/14/2017 1012   CL 102 07/17/2019 0850   CO2 31 07/17/2019 0850   CO2 30 (H) 02/14/2017 1012   BUN 16 07/17/2019 0850   BUN 14.1 02/14/2017 1012   CREATININE 1.17 07/17/2019 0850   CREATININE 1.23 (H) 01/29/2019 1023   CREATININE 1.2 (H) 02/14/2017 1012      Component Value Date/Time   CALCIUM 11.0 (H) 07/17/2019 0850   CALCIUM 11.3 (H) 07/17/2019 0850   CALCIUM 9.6 02/14/2017 1012   ALKPHOS 62 07/17/2019 0850   ALKPHOS 72 02/14/2017 1012   AST 22 07/17/2019 0850   AST 20 01/29/2019 1023   AST 15 02/14/2017 1012   ALT 23 07/17/2019 0850   ALT 31 01/29/2019 1023   ALT 15 02/14/2017 1012   BILITOT 0.5 07/17/2019 0850   BILITOT 0.4 01/29/2019 1023   BILITOT 0.53 02/14/2017 1012       No results found for: LABCA2  No components found for: [id number]  No results for input(s): INR in the last 168 hours.  Urinalysis    Component Value Date/Time   COLORURINE YELLOW 11/24/2006 1539   APPEARANCEUR CLEAR 11/24/2006 1539   LABSPEC 1.018 11/24/2006 1539   PHURINE 6.0 11/24/2006 1539   GLUCOSEU NEGATIVE 11/24/2006 1539   HGBUR SMALL (A) 11/24/2006 1539   BILIRUBINUR NEGATIVE 11/24/2006 [place] 11/24/2006 1539   PROTEINUR 100 (A) 11/24/2006 1539   UROBILINOGEN 1.0 11/24/2006 1539   NITRITE NEGATIVE 11/24/2006 1539   LEUKOCYTESUR MODERATE (A) 11/24/2006 1539     STUDIES: CLINICAL DATA:  Status post left lumpectomy,  radiation therapy and chemotherapy for breast cancer in 2018.  EXAM: DIGITAL DIAGNOSTIC BILATERAL MAMMOGRAM WITH TOMO AND CAD  COMPARISON:  Previous exam(s).  ACR Breast Density Category c: The breast tissue is heterogeneously dense, which may obscure small masses.  FINDINGS: Stable post lumpectomy changes on the left. No interval findings suspicious for malignancy in either breast.  Mammographic images were processed with CAD.  IMPRESSION: No evidence of malignancy.  RECOMMENDATION: Bilateral diagnostic mammogram in 1 year.  I have discussed the findings and recommendations with the patient. If applicable, a reminder letter will be sent to the patient regarding the next appointment.  BI-RADS CATEGORY  2: Benign.   Electronically Signed   By: Claudie Revering M.D.   On: 10/28/2019 16:13    ELIGIBLE FOR AVAILABLE RESEARCH PROTOCOL: no  ASSESSMENT: 59 y.o. [place] woman status post left breast lower inner quadrant biopsy 05/10/2016 for a clinical  T2 N0, prognostic stage IIB invaside ductal carcinoma, grade 3, weekly estrogen receptor positive, progesterone receptor negative, with no HER-2 amplification, and the MIB-1 at 90%  (a) biopsy of the 0.6 mm satellite at the 6:30 o'clock radiant in the left breast 06/02/2016 showed invasive ductal carcinoma, grade 3, estrogen receptor 20% positive, with weak staining intensity, progesterone receptor negative with no HER-2 amplification, and an MIB-1 of 70% (identical to larger mass)   (1) neoadjuvant chemotherapy consisting of doxorubicin and cyclophosphamide in dose dense fashion 4 started 06/01/2016, completed 07/12/2016  followed by Paclitaxel weekly 12 started 07/26/2016, completed 10/11/2016  (2) definitive surgery 11/14/2016 found a residual pT1a pN0 area of invasive ductal carcinoma (0.2 mm), with negative margins.  (3) adjuvant radiation 12/20/16-01/17/17 Site/dose:   Left Breast, 50.4 Gy total delivered in  28 fractions   (4) started anastrozole 02/28/2017  (a) left heel DEXA scan 11/24/2004 was normal  (b) renal feels any additional radiation may be detrimental so bone density not updated  (5) hypercalcemia with worsening renal function  (a) denosumab 60 mg and IV fluids given 08/03/2018  (b) bone scan 08/10/2018 shows no evidence of metastatic disease.   PLAN: Erika Freeman is here today for f/u of her estrogen positive breast cancer.  She has no clinical or radiographic sign of breast cancer recurrence.  She continues on Anastrozole daily with good tolerance and will continue this.    We discussed bone health in detail.  I gave her a handout and instructed her to disregard the portion about calcium (due to h/o hypercalcemia) and review portions about weight bearing exercises.  She understands this.   She will be due for her next mammogram in 09/2020.  I encouraged her to stay up to date with her other cancer screenings (GI, GYN, skin) with her PCP.  She was recommended healthy diet and exercise.    Erika Freeman will return in 1 year for f/u with Dr. Jana Hakim.  She knows to call for any questions that may arise between now and her next  appointment.  We are happy to see her sooner if needed.    Total encounter time: 20 minutes*  Wilber Bihari, NP 11/13/19 2:32 PM Medical Oncology and Hematology [facility] [place], [place] [address] Tel. [phone]    Fax. [phone]  *Total Encounter Time as defined by the Centers for Medicare and Medicaid Services includes, in addition to the face-to-face time of a patient visit (documented in the note above) non-face-to-face time: obtaining and reviewing outside history, ordering and reviewing medications, tests or procedures, care coordination (communications with other health care professionals or caregivers) and documentation in the medical record.

## 2019-11-14 ENCOUNTER — Telehealth: Payer: Self-pay | Admitting: Adult Health

## 2019-11-14 NOTE — Telephone Encounter (Signed)
Scheduled appts per 9/15 los. Left voicemail with appt date and time.

## 2019-11-15 ENCOUNTER — Ambulatory Visit (INDEPENDENT_AMBULATORY_CARE_PROVIDER_SITE_OTHER): Payer: Medicare Other | Admitting: Sports Medicine

## 2019-11-15 ENCOUNTER — Encounter: Payer: Self-pay | Admitting: Sports Medicine

## 2019-11-15 ENCOUNTER — Other Ambulatory Visit: Payer: Self-pay

## 2019-11-15 DIAGNOSIS — Q828 Other specified congenital malformations of skin: Secondary | ICD-10-CM

## 2019-11-15 DIAGNOSIS — M7741 Metatarsalgia, right foot: Secondary | ICD-10-CM

## 2019-11-15 DIAGNOSIS — E119 Type 2 diabetes mellitus without complications: Secondary | ICD-10-CM | POA: Diagnosis not present

## 2019-11-15 DIAGNOSIS — M7742 Metatarsalgia, left foot: Secondary | ICD-10-CM

## 2019-11-15 NOTE — Progress Notes (Signed)
Subjective: Erika Freeman is a 59 y.o. female patient who returns to office for evaluation of Right> Left foot pain secondary to callus skin. Patient reports that the pain has come back and she needs it trimmed again last blood sugar 93 last A1c 5.5.  Patient reports that she went for orthotic measurement but then did not want to compromise with giving up some of her styles of shoes in order to wear the orthotic but decided not to get them.   Patient Active Problem List   Diagnosis Date Noted  . Abdominal pain 07/17/2019  . Diabetes ([place]) 06/20/2019  . Hyperparathyroidism, primary ([place]) 03/24/2019  . Hypokalemia 05/10/2017  . Dysuria 04/12/2017  . Renal transplant rejection 02/24/2017  . Malignant neoplasm of female breast ([place]) 05/16/2016  . Hypomagnesemia 02/27/2014  . Hypophosphatemia 02/20/2014  . Immunosuppressive management encounter following kidney transplant 02/20/2014  . 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 Immunosuppression ([place]) 02/16/2014  . Essential hypertension 07/18/2012  . H/O cesarean section 07/18/2012  . Encounter for monitoring tacrolimus therapy 07/18/2012  . End-stage renal disease on peritoneal dialysis ([place]) 07/18/2012  . Umbilical hernia s/p primary repair 01/10/2012 01/24/2012  . Constipation, chronic 01/24/2012  . FSGS (focal segmental glomerulosclerosis) 11/23/2011  . Uterine fibromyoma 11/23/2011  . Chronic kidney disease, stage V (very severe) ([place])     Current Outpatient Medications on File Prior to Visit  Medication Sig Dispense Refill  . amLODipine (NORVASC) 10 MG tablet Take 10 mg by mouth daily.     [name] anastrozole (ARIMIDEX) 1 MG tablet TAKE 1 TABLET(1 MG) BY MOUTH DAILY 90 tablet 4  . aspirin 81 MG tablet Take 81 mg by mouth every morning.     [name] atorvastatin (LIPITOR) 10 MG tablet Take 10 mg by mouth 3 (three) times a week.    . cholecalciferol (VITAMIN D3) 25 MCG (1000 UNIT) tablet Take 1,000 Units by mouth daily.    [name]  glucose blood (ACCU-CHEK AVIVA PLUS) test strip Use to check blood sugar once a day. 100 each 12  . potassium chloride SA (KLOR-CON) 20 MEQ tablet Take 40 mEq by mouth 2 (two) times daily.    . predniSONE (DELTASONE) 5 MG tablet Take 5 mg by mouth every morning.     . RYBELSUS 7 MG TABS Take 1 tablet by mouth daily.    . Semaglutide (RYBELSUS) 3 MG TABS Take 3 mg by mouth daily. 30 tablet 11  . tacrolimus (PROGRAF) 1 MG capsule Take by mouth 2 (two) times daily. Takes 7mg  in the AM and 6mg  in the PM.     No current facility-administered medications on file prior to visit.    No Known Allergies  Objective:  General: Alert and oriented x3 in no acute distress  Dermatology: Keratotic lesion present submet 3/4 bilateral with skin lines transversing the lesion right greater than left, pain is present with direct pressure to the lesion with a central nucleated core noted, no webspace macerations, no ecchymosis bilateral, all nails x 10 are well manicured.  Vascular: Dorsalis Pedis and Posterior Tibial pedal pulses 1/4, Capillary Fill Time 3 seconds, + pedal hair growth bilateral, no edema bilateral lower extremities, Temperature gradient within normal limits.  Neurology: Gross sensation intact via light touch bilateral.  Musculoskeletal: Mild tenderness with palpation at the keratotic lesion site on Right>Left, hammertoe, pes planus noted, muscular strength 5/5 in all groups without pain or limitation on range of motion.  Assessment and Plan: Problem List Items Addressed This  Visit    None    Visit Diagnoses    Porokeratosis    -  Primary   Diabetes mellitus without complication ([place])       Metatarsalgia of both feet          -Complete examination performed -Re-Discussed treatment options for keratosis in the setting of diabetes -Parred keratoic lesion using a chisel x2 treated the area withSalinocaine covered with Band-Aid -Continue with daily skin emollients and may try soaking as  needed -Advised patient to continue with good supportive shoes daily for foot type -Dispensed offloading padding for patient to use if there is any pain at the ball of the foot especially on the right -Patient to return to office as scheduled in 9 to 10 weeks for callus care or sooner if condition worsens.  Landis Martins, DPM

## 2019-11-19 ENCOUNTER — Ambulatory Visit: Payer: Medicare Other | Admitting: Endocrinology

## 2019-12-17 ENCOUNTER — Ambulatory Visit
Admission: RE | Admit: 2019-12-17 | Discharge: 2019-12-17 | Disposition: A | Discharge status: Home / Self Care | Payer: Medicare Other | Source: Ambulatory Visit | Attending: Adult Health | Admitting: Adult Health

## 2019-12-17 ENCOUNTER — Other Ambulatory Visit: Payer: Self-pay

## 2019-12-17 DIAGNOSIS — E2839 Other primary ovarian failure: Secondary | ICD-10-CM

## 2019-12-17 DIAGNOSIS — C50919 Malignant neoplasm of unspecified site of unspecified female breast: Secondary | ICD-10-CM

## 2019-12-18 ENCOUNTER — Telehealth: Payer: Self-pay

## 2019-12-18 NOTE — Telephone Encounter (Signed)
Called and given below message. She verbalized understanding. 

## 2019-12-18 NOTE — Telephone Encounter (Signed)
-----   Message from Gardenia Phlegm, NP sent at 12/17/2019  4:06 PM EDT ----- Bone density is normal. Please notify patient ----- Message ----- From: Interface, Rad Results In Sent: 12/17/2019   2:16 PM EDT To: Gardenia Phlegm, NP

## 2020-01-17 ENCOUNTER — Ambulatory Visit: Payer: Medicare Other | Admitting: Sports Medicine

## 2020-01-28 ENCOUNTER — Ambulatory Visit: Payer: Medicare Other | Admitting: Sports Medicine

## 2020-01-31 ENCOUNTER — Encounter: Payer: Self-pay | Admitting: Endocrinology

## 2020-01-31 ENCOUNTER — Other Ambulatory Visit: Payer: Self-pay

## 2020-01-31 ENCOUNTER — Ambulatory Visit (INDEPENDENT_AMBULATORY_CARE_PROVIDER_SITE_OTHER): Payer: Medicare Other | Admitting: Endocrinology

## 2020-01-31 VITALS — BP 124/80 | HR 88 | Ht 65.0 in | Wt 150.2 lb

## 2020-01-31 DIAGNOSIS — E1122 Type 2 diabetes mellitus with diabetic chronic kidney disease: Secondary | ICD-10-CM | POA: Diagnosis not present

## 2020-01-31 DIAGNOSIS — N1831 Chronic kidney disease, stage 3a: Secondary | ICD-10-CM | POA: Diagnosis not present

## 2020-01-31 LAB — POCT GLYCOSYLATED HEMOGLOBIN (HGB A1C): Hemoglobin A1C: 5.5 % (ref 4.0–5.6)

## 2020-01-31 NOTE — Progress Notes (Signed)
Subjective:    Patient ID: Erika Freeman, female    DOB: Jun 13, 1960, 59 y.o.   MRN: [id number]  HPI Pt returns for f/u of diabetes mellitus:  DM type: 2 Dx'ed: 1884 Complications: CRI (transplant 2015).    Therapy: Rybelsus GDM: never DKA: never Severe hypoglycemia: never.   Pancreatitis: never Pancreatic imaging: never.   SDOH: none. Other: she has never been on insulin; CRI limits rx options.   Interval history: pt says cbg's vary from 77-100's.  belching is resolved.   Past Medical History:  Diagnosis Date  . Anemia   . CKD (chronic kidney disease), stage III ([place])    nephologist-- dr Ria Comment [facility])  and transplant clinic [facility]  . Diabetes mellitus without complication ([place])   . Diverticulosis of colon   . GERD (gastroesophageal reflux disease)    03-21-2019 -- per pt w/ spicy foods and at night time occasionally,  chews watermelon gum and pt states it resolves  . Heart murmur    03-21-2019  per pt was told had murmur some years ago, pt is asymptomatic (last echo in [facility] 05-31-2016  trivial regurg MR and TR)  . History of end stage renal disease    secondary to FSGS with PD until kidney transplant 12/ 2015  . History of radiation therapy 12/20/16  to 01/17/17   left breast 50.4 gy in 28 fractions  . Hypercalcemia    pt had recent hospital admision @[facility]  (in care everywhere) for acute kidney injury due to hypercalcemia and hypomagnesium,  resolved with medication, lov w/ transplant clinic @[facility]  02-14-2019  calcium  normal per lab (care everywhere)   . Hypertension    followed by transplant clinic @[facility] ---  (pt had stress echo 07-11-2013, negative for ischemia, in care everywhere)  . Hypokalemia    pt hospital admission   . Hypomagnesemia   . Immunosuppression ([place])    due to kidney transplant 02-12-2014  . Kidney transplant status, living unrelated donor followed by transplant clinic @[facility] -- Dr Delane Ginger. [id number]   02-12-2014  @[facility]   . Malignant  neoplasm of breast (female) [facility]) oncologist--- dr Jana Hakim (lov in epic no recurrence 01-18-2019)   dx 03/ 2018  IDC Stage IIB ---  completed chemotherapy 10-11-2016;  s/p left breast lumpectomy with node dissection 11-14-2016;  completed radiation 01-17-2017  . Personal history of chemotherapy    left breast cancer--  06-01-2016  to 10-11-2016  . Personal history of radiation therapy   . Primary hyperparathyroidism ([place])    12/ 2020 w/ hypercalcimia  (pt has hx of hyperparathyoidism s/p right inferior parathyroidectomy 11-27-2006 @[facility] ;  per path adenoma)  . Uterine fibroid     Past Surgical History:  Procedure Laterality Date  . BREAST BIOPSY    . BREAST LUMPECTOMY Left    2018  . BREAST LUMPECTOMY WITH RADIOACTIVE SEED AND SENTINEL LYMPH NODE BIOPSY Left 11/14/2016   Procedure: LEFT BREAST RADIOACTIVE SEED BRACKETED LUMPECTOMY, LEFT AXILLARY SENTINEL LYMPH NODE BIOPSY WITH BLUE DYE INJECTION;  Surgeon: [name], MD;  Location: [place];  Service: General;  Laterality: Left;  . BREAST SURGERY  2012   left - fibroadenoma  . CAPD INSERTION  01/10/2012   Procedure: LAPAROSCOPIC INSERTION CONTINUOUS AMBULATORY PERITONEAL DIALYSIS  (CAPD) CATHETER;  Surgeon: Adin Hector, MD;  Location: [place];  Service: General;  Laterality: N/A;  LAPAROSCOPIC CAPD CATHETER PLACEMENT OMENTOPEXY  . CESAREAN SECTION  1995  . COLONOSCOPY    . DILATION AND CURETTAGE OF UTERUS  2000  . KIDNEY TRANSPLANT    . PARATHYROIDECTOMY Right 11-27-2006   dr gerkin @[facility]    right inferior  (adenoma)  . PARATHYROIDECTOMY Right 03/25/2019   Procedure: RIGHT SUPERIOR PARATHYROIDECTOMY;  Surgeon: Armandina Gemma, MD;  Location: [place];  Service: General;  Laterality: Right;  . PORT-A-CATH REMOVAL Right 11/14/2016   Procedure: REMOVAL PORT-A-CATH;  Surgeon: [name], MD;  Location: [place];  Service: General;  Laterality: Right;  . PORTACATH PLACEMENT Right 05/24/2016   Procedure: INSERTION  PORT-A-CATH WITH [place];  Surgeon: [name], MD;  Location: [place];  Service: General;  Laterality: Right;  . RENAL BIOPSY  2011  . TUBAL LIGATION    . UMBILICAL HERNIA REPAIR  01/10/2012   Procedure: HERNIA REPAIR UMBILICAL ADULT;  Surgeon: Adin Hector, MD;  Location: [place];  Service: General;  Laterality: N/A;    Social History   Socioeconomic History  . Marital status: Married    Spouse name: Not on file  . Number of children: 3  . Years of education: Not on file  . Highest education level: Not on file  Occupational History  . Not on file  Tobacco Use  . Smoking status: Never Smoker  . Smokeless tobacco: Never Used  Vaping Use  . Vaping Use: Never used  Substance and Sexual Activity  . Alcohol use: No  . Drug use: Never  . Sexual activity: Not on file  Other Topics Concern  . Not on file  Social History Narrative  . Not on file   Social Determinants of Health   Financial Resource Strain:   . Difficulty of Paying Living Expenses: Not on file  Food Insecurity:   . Worried About Charity fundraiser in the Last Year: Not on file  . Ran Out of Food in the Last Year: Not on file  Transportation Needs:   . Lack of Transportation (Medical): Not on file  . Lack of Transportation (Non-Medical): Not on file  Physical Activity:   . Days of Exercise per Week: Not on file  . Minutes of Exercise per Session: Not on file  Stress:   . Feeling of Stress : Not on file  Social Connections:   . Frequency of Communication with Friends and Family: Not on file  . Frequency of Social Gatherings with Friends and Family: Not on file  . Attends Religious Services: Not on file  . Active Member of Clubs or Organizations: Not on file  . Attends Archivist Meetings: Not on file  . Marital Status: Not on file  Intimate Partner Violence:   . Fear of Current or Ex-Partner: Not on file  . Emotionally Abused: Not on file  . Physically Abused: Not on file  . Sexually Abused:  Not on file    Current Outpatient Medications on File Prior to Visit  Medication Sig Dispense Refill  . amLODipine (NORVASC) 10 MG tablet Take 10 mg by mouth daily.     [name] anastrozole (ARIMIDEX) 1 MG tablet TAKE 1 TABLET(1 MG) BY MOUTH DAILY 90 tablet 4  . aspirin 81 MG tablet Take 81 mg by mouth every morning.     [name] atorvastatin (LIPITOR) 10 MG tablet Take 10 mg by mouth 3 (three) times a week.    . cholecalciferol (VITAMIN D3) 25 MCG (1000 UNIT) tablet Take 1,000 Units by mouth daily.    [name] glucose blood (ACCU-CHEK AVIVA PLUS) test strip Use to check blood sugar once a day. 100 each 12  .  potassium chloride SA (KLOR-CON) 20 MEQ tablet Take 40 mEq by mouth 2 (two) times daily.    . predniSONE (DELTASONE) 5 MG tablet Take 5 mg by mouth every morning.     . promethazine (PHENERGAN) 12.5 MG tablet Take 12.5 mg by mouth every 6 (six) hours as needed.    . RYBELSUS 7 MG TABS Take 1 tablet by mouth daily.    . Semaglutide (RYBELSUS) 3 MG TABS Take 3 mg by mouth daily. 30 tablet 11  . tacrolimus (PROGRAF) 1 MG capsule Take by mouth 2 (two) times daily. Takes 7mg  in the AM and 6mg  in the PM.     No current facility-administered medications on file prior to visit.    No Known Allergies  Family History  Problem Relation Age of Onset  . Cancer Maternal Uncle        lung  . Cancer Cousin        colon  . Diabetes Maternal Grandmother   . Colon cancer Neg Hx   . Colon polyps Neg Hx   . Esophageal cancer Neg Hx   . Stomach cancer Neg Hx   . Rectal cancer Neg Hx     BP 124/80   Pulse 88   Ht 5\' 5"  (1.651 m)   Wt 150 lb 3.2 oz (68.1 kg)   LMP 04/27/2016 (Exact Date)   SpO2 97%   BMI 24.99 kg/m    Review of Systems She has lost 9 more lbs since last ov here.      Objective:   Physical Exam VITAL SIGNS:  See vs page GENERAL: no distress Pulses: dorsalis pedis intact bilat.   MSK: no deformity of the feet CV: no leg edema.   Skin:  no ulcer on the feet.  normal color and temp on  the feet.   Neuro: sensation is intact to touch on the feet.    A1c=5.5%     Assessment & Plan:  Type 2 DM: well-controlled CRI: check fructosamine  Patient Instructions  check your blood sugar once a day.  vary the time of day when you check, between before the 3 meals, and at bedtime.  also check if you have symptoms of your blood sugar being too high or too low.  please keep a record of the readings and bring it to your next appointment here (or you can bring the meter itself).  You can write it on any piece of paper.  please call us sooner if your blood sugar goes below 70, or if you have a lot of readings over 200.   A different type of diabetes blood test is requested for you today.  We'll let you know about the results.   Please come back for a follow-up appointment in 4 months.

## 2020-01-31 NOTE — Patient Instructions (Addendum)
check your blood sugar once a day.  vary the time of day when you check, between before the 3 meals, and at bedtime.  also check if you have symptoms of your blood sugar being too high or too low.  please keep a record of the readings and bring it to your next appointment here (or you can bring the meter itself).  You can write it on any piece of paper.  please call us sooner if your blood sugar goes below 70, or if you have a lot of readings over 200.   A different type of diabetes blood test is requested for you today.  We'll let you know about the results.   Please come back for a follow-up appointment in 4 months.

## 2020-02-04 ENCOUNTER — Telehealth: Payer: Self-pay

## 2020-02-04 LAB — FRUCTOSAMINE: Fructosamine: 259 umol/L (ref 205–285)

## 2020-02-04 NOTE — Telephone Encounter (Signed)
-----   Message from Renato Shin, MD sent at 02/04/2020 11:45 AM EST ----- please contact patient: This is like an a1c of approx 6.3%.  this is higher than the A1c itself, but still really good.  Please continue the same medications.  I'll see you next time.

## 2020-02-04 NOTE — Telephone Encounter (Signed)
Left message for patient to call back regarding results and recommendations. °

## 2020-02-11 ENCOUNTER — Other Ambulatory Visit: Payer: Self-pay

## 2020-02-11 ENCOUNTER — Ambulatory Visit (INDEPENDENT_AMBULATORY_CARE_PROVIDER_SITE_OTHER): Payer: Medicare Other | Admitting: Sports Medicine

## 2020-02-11 ENCOUNTER — Encounter: Payer: Self-pay | Admitting: Sports Medicine

## 2020-02-11 DIAGNOSIS — Z940 Kidney transplant status: Secondary | ICD-10-CM | POA: Insufficient documentation

## 2020-02-11 DIAGNOSIS — E119 Type 2 diabetes mellitus without complications: Secondary | ICD-10-CM

## 2020-02-11 DIAGNOSIS — M7741 Metatarsalgia, right foot: Secondary | ICD-10-CM

## 2020-02-11 DIAGNOSIS — M2141 Flat foot [pes planus] (acquired), right foot: Secondary | ICD-10-CM

## 2020-02-11 DIAGNOSIS — D849 Immunodeficiency, unspecified: Secondary | ICD-10-CM | POA: Insufficient documentation

## 2020-02-11 DIAGNOSIS — M2142 Flat foot [pes planus] (acquired), left foot: Secondary | ICD-10-CM

## 2020-02-11 DIAGNOSIS — M7742 Metatarsalgia, left foot: Secondary | ICD-10-CM

## 2020-02-11 DIAGNOSIS — K219 Gastro-esophageal reflux disease without esophagitis: Secondary | ICD-10-CM | POA: Insufficient documentation

## 2020-02-11 DIAGNOSIS — E785 Hyperlipidemia, unspecified: Secondary | ICD-10-CM | POA: Insufficient documentation

## 2020-02-11 DIAGNOSIS — Q828 Other specified congenital malformations of skin: Secondary | ICD-10-CM

## 2020-02-11 DIAGNOSIS — M2040 Other hammer toe(s) (acquired), unspecified foot: Secondary | ICD-10-CM

## 2020-02-11 NOTE — Progress Notes (Signed)
Subjective: Erika Freeman is a 59 y.o. female patient who returns to office for evaluation of Right> Left foot pain secondary to callus skin. Patient reports that she is doing ok and she needs callus trimmed again last blood sugar 88 last A1c 6.1.  No other issues noted.   Last PCP visit, [facility] 2 weeks ago.  Patient Active Problem List   Diagnosis Date Noted  . Abdominal pain 07/17/2019  . Diabetes ([place]) 06/20/2019  . Hyperparathyroidism, primary ([place]) 03/24/2019  . Hypokalemia 05/10/2017  . Dysuria 04/12/2017  . Renal transplant rejection 02/24/2017  . Malignant neoplasm of female breast ([place]) 05/16/2016  . Hypomagnesemia 02/27/2014  . Hypophosphatemia 02/20/2014  . Immunosuppressive management encounter following kidney transplant 02/20/2014  . 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 Immunosuppression ([place]) 02/16/2014  . Essential hypertension 07/18/2012  . H/O cesarean section 07/18/2012  . Encounter for monitoring tacrolimus therapy 07/18/2012  . End-stage renal disease on peritoneal dialysis ([place]) 07/18/2012  . Umbilical hernia s/p primary repair 01/10/2012 01/24/2012  . Constipation, chronic 01/24/2012  . FSGS (focal segmental glomerulosclerosis) 11/23/2011  . Uterine fibromyoma 11/23/2011  . Chronic kidney disease, stage V (very severe) ([place])     Current Outpatient Medications on File Prior to Visit  Medication Sig Dispense Refill  . amLODipine (NORVASC) 10 MG tablet Take 10 mg by mouth daily.     [name] anastrozole (ARIMIDEX) 1 MG tablet TAKE 1 TABLET(1 MG) BY MOUTH DAILY 90 tablet 4  . aspirin 81 MG tablet Take 81 mg by mouth every morning.     [name] atorvastatin (LIPITOR) 10 MG tablet Take 10 mg by mouth 3 (three) times a week.    . cholecalciferol (VITAMIN D3) 25 MCG (1000 UNIT) tablet Take 1,000 Units by mouth daily.    [name] glucose blood (ACCU-CHEK AVIVA PLUS) test strip Use to check blood sugar once a day. 100 each 12  . potassium chloride SA (KLOR-CON)  20 MEQ tablet Take 40 mEq by mouth 2 (two) times daily.    . predniSONE (DELTASONE) 5 MG tablet Take 5 mg by mouth every morning.     . promethazine (PHENERGAN) 12.5 MG tablet Take 12.5 mg by mouth every 6 (six) hours as needed.    . RYBELSUS 7 MG TABS Take 1 tablet by mouth daily.    . Semaglutide (RYBELSUS) 3 MG TABS Take 3 mg by mouth daily. 30 tablet 11  . tacrolimus (PROGRAF) 1 MG capsule Take by mouth 2 (two) times daily. Takes 7mg  in the AM and 6mg  in the PM.     No current facility-administered medications on file prior to visit.    No Known Allergies  Objective:  General: Alert and oriented x3 in no acute distress  Dermatology: Keratotic lesion present submet 3/4 bilateral with skin lines transversing the lesion right greater than left, pain is present with direct pressure to the lesion with a central nucleated core noted, no webspace macerations, no ecchymosis bilateral, all nails x 10 are well manicured.  Vascular: Dorsalis Pedis and Posterior Tibial pedal pulses 1/4, Capillary Fill Time 3 seconds, + pedal hair growth bilateral, no edema bilateral lower extremities, Temperature gradient within normal limits.  Neurology: Gross sensation intact via light touch bilateral.  Musculoskeletal: Mild tenderness with palpation at the keratotic lesion site on Right>Left, hammertoe, pes planus noted, muscular strength 5/5 in all groups without pain or limitation on range of motion.  Assessment and Plan: Problem List Items Addressed This Visit   None  Visit Diagnoses    Porokeratosis    -  Primary   Diabetes mellitus without complication (HCC)       Metatarsalgia of both feet       Hammer toe, unspecified laterality       Pes planus of both feet           -Complete examination performed -Re-Discussed treatment options for keratosis in the setting of diabetes -Parred keratoic lesion using a chisel x2 treated the area withSalinocaine covered with Band-Aid -Continue with daily  skin emollients  -Advised patient to continue with good supportive shoes daily for foot type -Return in 10-12 weeks for diabetic callus trim.   Landis Martins, DPM

## 2020-02-25 DIAGNOSIS — E119 Type 2 diabetes mellitus without complications: Secondary | ICD-10-CM | POA: Diagnosis not present

## 2020-02-25 LAB — HM DIABETES EYE EXAM

## 2020-04-13 DIAGNOSIS — Z940 Kidney transplant status: Secondary | ICD-10-CM | POA: Diagnosis not present

## 2020-04-14 DIAGNOSIS — Z940 Kidney transplant status: Secondary | ICD-10-CM | POA: Diagnosis not present

## 2020-04-17 DIAGNOSIS — N390 Urinary tract infection, site not specified: Secondary | ICD-10-CM | POA: Diagnosis not present

## 2020-04-21 ENCOUNTER — Ambulatory Visit: Payer: Medicare Other | Admitting: Sports Medicine

## 2020-06-05 ENCOUNTER — Other Ambulatory Visit: Payer: Self-pay

## 2020-06-05 ENCOUNTER — Ambulatory Visit: Payer: Medicare Other | Admitting: Endocrinology

## 2020-06-05 VITALS — BP 130/86 | HR 78 | Ht 65.0 in | Wt 152.6 lb

## 2020-06-05 DIAGNOSIS — N1831 Chronic kidney disease, stage 3a: Secondary | ICD-10-CM | POA: Diagnosis not present

## 2020-06-05 DIAGNOSIS — E1122 Type 2 diabetes mellitus with diabetic chronic kidney disease: Secondary | ICD-10-CM

## 2020-06-05 LAB — POCT GLYCOSYLATED HEMOGLOBIN (HGB A1C): Hemoglobin A1C: 5.5 % (ref 4.0–5.6)

## 2020-06-05 NOTE — Progress Notes (Signed)
Subjective:    Patient ID: Erika Freeman, female    DOB: January 15, 1961, 60 y.o.   MRN: [id number]  HPI Pt returns for f/u of diabetes mellitus:  DM type: 2 Dx'ed: 6568 Complications: CRI (transplant 2015).    Therapy: Rybelsus GDM: never DKA: never Severe hypoglycemia: never.   Pancreatitis: never Pancreatic imaging: never.   SDOH: none. Other: she has never been on insulin; CRI limits rx options; fructosamine converts to A1c slightly higher than A1c itself. Interval history: pt says cbg's are well-controlled Past Medical History:  Diagnosis Date  . Anemia   . CKD (chronic kidney disease), stage III ([place])    nephologist-- dr Ria Comment [facility])  and transplant clinic [facility]  . Diabetes mellitus without complication ([place])   . Diverticulosis of colon   . GERD (gastroesophageal reflux disease)    03-21-2019 -- per pt w/ spicy foods and at night time occasionally,  chews watermelon gum and pt states it resolves  . Heart murmur    03-21-2019  per pt was told had murmur some years ago, pt is asymptomatic (last echo in [facility] 05-31-2016  trivial regurg MR and TR)  . History of end stage renal disease    secondary to FSGS with PD until kidney transplant 12/ 2015  . History of radiation therapy 12/20/16  to 01/17/17   left breast 50.4 gy in 28 fractions  . Hypercalcemia    pt had recent hospital admision @[facility]  (in care everywhere) for acute kidney injury due to hypercalcemia and hypomagnesium,  resolved with medication, lov w/ transplant clinic [facility]  02-14-2019  calcium  normal per lab (care everywhere)   . Hypertension    followed by transplant clinic @[facility] ---  (pt had stress echo 07-11-2013, negative for ischemia, in care everywhere)  . Hypokalemia    pt hospital admission   . Hypomagnesemia   . Immunosuppression ([place])    due to kidney transplant 02-12-2014  . Kidney transplant status, living unrelated donor followed by transplant clinic [facility] -- Dr Delane Ginger. [id number]    02-12-2014  @[facility]   . Malignant neoplasm of breast (female) [facility]) oncologist--- dr Jana Hakim (lov in epic no recurrence 01-18-2019)   dx 03/ 2018  IDC Stage IIB ---  completed chemotherapy 10-11-2016;  s/p left breast lumpectomy with node dissection 11-14-2016;  completed radiation 01-17-2017  . Personal history of chemotherapy    left breast cancer--  06-01-2016  to 10-11-2016  . Personal history of radiation therapy   . Primary hyperparathyroidism ([place])    12/ 2020 w/ hypercalcimia  (pt has hx of hyperparathyoidism s/p right inferior parathyroidectomy 11-27-2006 @[facility] ;  per path adenoma)  . Uterine fibroid     Past Surgical History:  Procedure Laterality Date  . BREAST BIOPSY    . BREAST LUMPECTOMY Left    2018  . BREAST LUMPECTOMY WITH RADIOACTIVE SEED AND SENTINEL LYMPH NODE BIOPSY Left 11/14/2016   Procedure: LEFT BREAST RADIOACTIVE SEED BRACKETED LUMPECTOMY, LEFT AXILLARY SENTINEL LYMPH NODE BIOPSY WITH BLUE DYE INJECTION;  Surgeon: [name], MD;  Location: [place];  Service: General;  Laterality: Left;  . BREAST SURGERY  2012   left - fibroadenoma  . CAPD INSERTION  01/10/2012   Procedure: LAPAROSCOPIC INSERTION CONTINUOUS AMBULATORY PERITONEAL DIALYSIS  (CAPD) CATHETER;  Surgeon: Adin Hector, MD;  Location: [place];  Service: General;  Laterality: N/A;  LAPAROSCOPIC CAPD CATHETER PLACEMENT OMENTOPEXY  . CESAREAN SECTION  1995  . COLONOSCOPY    . DILATION AND CURETTAGE OF UTERUS  2000  . KIDNEY TRANSPLANT    . PARATHYROIDECTOMY Right 11-27-2006   dr gerkin @[facility]    right inferior  (adenoma)  . PARATHYROIDECTOMY Right 03/25/2019   Procedure: RIGHT SUPERIOR PARATHYROIDECTOMY;  Surgeon: Armandina Gemma, MD;  Location: [place];  Service: General;  Laterality: Right;  . PORT-A-CATH REMOVAL Right 11/14/2016   Procedure: REMOVAL PORT-A-CATH;  Surgeon: [name], MD;  Location: [place];  Service: General;  Laterality: Right;  . PORTACATH PLACEMENT Right  05/24/2016   Procedure: INSERTION PORT-A-CATH WITH [place];  Surgeon: [name], MD;  Location: [place];  Service: General;  Laterality: Right;  . RENAL BIOPSY  2011  . TUBAL LIGATION    . UMBILICAL HERNIA REPAIR  01/10/2012   Procedure: HERNIA REPAIR UMBILICAL ADULT;  Surgeon: Adin Hector, MD;  Location: [place];  Service: General;  Laterality: N/A;    Social History   Socioeconomic History  . Marital status: Married    Spouse name: Not on file  . Number of children: 3  . Years of education: Not on file  . Highest education level: Not on file  Occupational History  . Not on file  Tobacco Use  . Smoking status: Never Smoker  . Smokeless tobacco: Never Used  Vaping Use  . Vaping Use: Never used  Substance and Sexual Activity  . Alcohol use: No  . Drug use: Never  . Sexual activity: Not on file  Other Topics Concern  . Not on file  Social History Narrative  . Not on file   Social Determinants of Health   Financial Resource Strain: Not on file  Food Insecurity: Not on file  Transportation Needs: Not on file  Physical Activity: Not on file  Stress: Not on file  Social Connections: Not on file  Intimate Partner Violence: Not on file    Current Outpatient Medications on File Prior to Visit  Medication Sig Dispense Refill  . amLODipine (NORVASC) 10 MG tablet Take 10 mg by mouth daily.     [name] anastrozole (ARIMIDEX) 1 MG tablet TAKE 1 TABLET(1 MG) BY MOUTH DAILY 90 tablet 4  . aspirin 81 MG tablet Take 81 mg by mouth every morning.     [name] atorvastatin (LIPITOR) 10 MG tablet Take 10 mg by mouth 3 (three) times a week.    . cholecalciferol (VITAMIN D3) 25 MCG (1000 UNIT) tablet Take 1,000 Units by mouth daily.    . cinacalcet (SENSIPAR) 30 MG tablet 1 tablet with food or after a meal    . glucose blood (ACCU-CHEK AVIVA PLUS) test strip Use to check blood sugar once a day. 100 each 12  . Magnesium Oxide (MAG-OXIDE) 200 MG TABS See admin instructions.    . potassium chloride  SA (KLOR-CON) 20 MEQ tablet Take 40 mEq by mouth 2 (two) times daily.    . predniSONE (DELTASONE) 5 MG tablet Take 5 mg by mouth every morning.     . promethazine (PHENERGAN) 12.5 MG tablet Take 12.5 mg by mouth every 6 (six) hours as needed.    . RYBELSUS 7 MG TABS Take 1 tablet by mouth daily.    . Semaglutide (RYBELSUS) 3 MG TABS Take 3 mg by mouth daily. 30 tablet 11  . tacrolimus (PROGRAF) 1 MG capsule Take by mouth 2 (two) times daily. Takes 7mg  in the AM and 6mg  in the PM.     No current facility-administered medications on file prior to visit.    No Known Allergies  Family History  Problem Relation Age of Onset  . Cancer Maternal Uncle        lung  . Cancer Cousin        colon  . Diabetes Maternal Grandmother   . Colon cancer Neg Hx   . Colon polyps Neg Hx   . Esophageal cancer Neg Hx   . Stomach cancer Neg Hx   . Rectal cancer Neg Hx     BP 130/86 (BP Location: Right Arm, Patient Position: Sitting, Cuff Size: Normal)   Pulse 78   Ht 5\' 5"  (1.651 m)   Wt 152 lb 9.6 oz (69.2 kg)   LMP 04/27/2016 (Exact Date)   SpO2 97%   BMI 25.39 kg/m    Review of Systems Denies n/v/heartburn.     Objective:   Physical Exam VITAL SIGNS:  See vs page GENERAL: no distress Pulses: dorsalis pedis intact bilat.   MSK: no deformity of the feet CV: no leg edema Skin:  no ulcer on the feet.  normal color and temp on the feet. Neuro: sensation is intact to touch on the feet.     A1c=5.5%  Lab Results  Component Value Date   CREATININE 1.26 (H) 11/13/2019   BUN 14 11/13/2019   NA 142 11/13/2019   K 3.5 11/13/2019   CL 106 11/13/2019   CO2 28 11/13/2019       Assessment & Plan:  Type 2 DM: well-controlled.    Patient Instructions  check your blood sugar once a day.  vary the time of day when you check, between before the 3 meals, and at bedtime.  also check if you have symptoms of your blood sugar being too high or too low.  please keep a record of the readings and  bring it to your next appointment here (or you can bring the meter itself).  You can write it on any piece of paper.  please call us sooner if your blood sugar goes below 70, or if you have a lot of readings over 200.   Please continue the same Rybelsus Please come back for a follow-up appointment in 6 months.

## 2020-06-05 NOTE — Patient Instructions (Addendum)
check your blood sugar once a day.  vary the time of day when you check, between before the 3 meals, and at bedtime.  also check if you have symptoms of your blood sugar being too high or too low.  please keep a record of the readings and bring it to your next appointment here (or you can bring the meter itself).  You can write it on any piece of paper.  please call us sooner if your blood sugar goes below 70, or if you have a lot of readings over 200.   Please continue the same Rybelsus Please come back for a follow-up appointment in 6 months.

## 2020-06-16 ENCOUNTER — Other Ambulatory Visit: Payer: Self-pay | Admitting: *Deleted

## 2020-06-16 ENCOUNTER — Telehealth: Payer: Self-pay | Admitting: Endocrinology

## 2020-06-16 MED ORDER — RYBELSUS 3 MG PO TABS: 3.0000 mg | ORAL_TABLET | Freq: Every day | ORAL | 3 refills | Status: DC

## 2020-06-16 NOTE — Telephone Encounter (Signed)
Rx sent 

## 2020-06-16 NOTE — Telephone Encounter (Signed)
MEDICATION: Rybelsus 3 MG   PHARMACY:  [facility] on [name] in [place]?  Yes ,no refills left  IS THIS A 90 DAY SUPPLY : no  IS PATIENT OUT OF MEDICATION: no  IF NOT; HOW MUCH IS LEFT:   LAST APPOINTMENT DATE: @4 /09/2020  NEXT APPOINTMENT DATE:@10 /14/2022  DO WE HAVE YOUR PERMISSION TO LEAVE A DETAILED MESSAGE?: yes  OTHER COMMENTS:    **Let patient know to contact pharmacy at the end of the day to make sure medication is ready. **  ** Please notify patient to allow 48-72 hours to process**  **Encourage patient to contact the pharmacy for refills or they can request refills through [facility]**

## 2020-07-03 ENCOUNTER — Encounter: Payer: Self-pay | Admitting: Sports Medicine

## 2020-07-03 ENCOUNTER — Other Ambulatory Visit: Payer: Self-pay

## 2020-07-03 ENCOUNTER — Ambulatory Visit: Payer: Medicare Other | Admitting: Sports Medicine

## 2020-07-03 DIAGNOSIS — M79671 Pain in right foot: Secondary | ICD-10-CM

## 2020-07-03 DIAGNOSIS — M79672 Pain in left foot: Secondary | ICD-10-CM

## 2020-07-03 DIAGNOSIS — E119 Type 2 diabetes mellitus without complications: Secondary | ICD-10-CM

## 2020-07-03 DIAGNOSIS — Q828 Other specified congenital malformations of skin: Secondary | ICD-10-CM

## 2020-07-03 NOTE — Progress Notes (Signed)
Subjective: Erika Freeman is a 60 y.o. female patient who returns to office for evaluation of Right> Left foot pain secondary to callus skin. Patient reports that she is doing ok but lately the callus has buildup and has been very sore.  No other issues noted.  Fasting blood sugar not recorded.  Patient Active Problem List   Diagnosis Date Noted  . Gastroesophageal reflux disease without esophagitis 02/11/2020  . History of renal transplant 02/11/2020  . Hyperlipidemia 02/11/2020  . Immunodeficiency ([place]) 02/11/2020  . Abdominal pain 07/17/2019  . Diabetes ([place]) 06/20/2019  . Hyperparathyroidism, primary ([place]) 03/24/2019  . Hypokalemia 05/10/2017  . Dysuria 04/12/2017  . Renal transplant rejection 02/24/2017  . Malignant neoplasm of female breast ([place]) 05/16/2016  . Hypomagnesemia 02/27/2014  . Hypophosphatemia 02/20/2014  . Immunosuppressive management encounter following kidney transplant 02/20/2014  . 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 Immunosuppression ([place]) 02/16/2014  . Essential hypertension 07/18/2012  . H/O cesarean section 07/18/2012  . Encounter for monitoring tacrolimus therapy 07/18/2012  . End-stage renal disease on peritoneal dialysis ([place]) 07/18/2012  . Umbilical hernia s/p primary repair 01/10/2012 01/24/2012  . Constipation, chronic 01/24/2012  . FSGS (focal segmental glomerulosclerosis) 11/23/2011  . Uterine fibromyoma 11/23/2011  . Chronic kidney disease, stage V (very severe) ([place])     Current Outpatient Medications on File Prior to Visit  Medication Sig Dispense Refill  . amLODipine (NORVASC) 10 MG tablet Take 10 mg by mouth daily.     [name] anastrozole (ARIMIDEX) 1 MG tablet TAKE 1 TABLET(1 MG) BY MOUTH DAILY 90 tablet 4  . aspirin 81 MG tablet Take 81 mg by mouth every morning.     [name] atorvastatin (LIPITOR) 10 MG tablet Take 10 mg by mouth 3 (three) times a week.    . cholecalciferol (VITAMIN D3) 25 MCG (1000 UNIT) tablet Take 1,000  Units by mouth daily.    . cinacalcet (SENSIPAR) 30 MG tablet 1 tablet with food or after a meal    . glucose blood (ACCU-CHEK AVIVA PLUS) test strip Use to check blood sugar once a day. 100 each 12  . Magnesium Oxide (MAG-OXIDE) 200 MG TABS See admin instructions.    . potassium chloride SA (KLOR-CON) 20 MEQ tablet Take 40 mEq by mouth 2 (two) times daily.    . predniSONE (DELTASONE) 5 MG tablet Take 5 mg by mouth every morning.     . promethazine (PHENERGAN) 12.5 MG tablet Take 12.5 mg by mouth every 6 (six) hours as needed.    . RYBELSUS 7 MG TABS Take 1 tablet by mouth daily.    . Semaglutide (RYBELSUS) 3 MG TABS Take 3 mg by mouth daily. 30 tablet 3  . tacrolimus (PROGRAF) 1 MG capsule Take by mouth 2 (two) times daily. Takes 7mg  in the AM and 6mg  in the PM.     No current facility-administered medications on file prior to visit.    No Known Allergies  Objective:  General: Alert and oriented x3 in no acute distress  Dermatology: Keratotic lesion present submet 3/4 bilateral with skin lines transversing the lesion right greater than left, pain is present with direct pressure to the lesion with a central nucleated core noted, no webspace macerations, no ecchymosis bilateral, all nails x 10 are well manicured.  Vascular: Dorsalis Pedis and Posterior Tibial pedal pulses 1/4, Capillary Fill Time 3 seconds, + pedal hair growth bilateral, no edema bilateral lower extremities, Temperature gradient within normal limits.  Neurology: Gross sensation intact  via light touch bilateral.  Musculoskeletal: Mild tenderness with palpation at the keratotic lesion site on Right>Left, hammertoe, pes planus noted, muscular strength 5/5 in all groups without pain or limitation on range of motion.  Assessment and Plan: Problem List Items Addressed This Visit   None   Visit Diagnoses    Porokeratosis    -  Primary   Diabetes mellitus without complication ([place])       Foot pain, bilateral            -Complete examination performed -Re-Discussed treatment options for keratosis in the setting of diabetes -Parred keratoic lesion using a chisel x2 treated the area withSalinocaine covered with Band-Aid -Continue with daily skin emollients; recommend foot miracle cream -Advised patient to continue with good supportive shoes daily for foot type with offloading padding as dispensed this visit -Return in 10-12 weeks for diabetic callus trim like previous.  Landis Martins, DPM

## 2020-07-06 ENCOUNTER — Other Ambulatory Visit: Payer: Self-pay | Admitting: Endocrinology

## 2020-07-06 ENCOUNTER — Telehealth: Payer: Self-pay | Admitting: Endocrinology

## 2020-07-06 ENCOUNTER — Other Ambulatory Visit: Payer: Self-pay

## 2020-07-06 MED ORDER — RYBELSUS 3 MG PO TABS: 3.0000 mg | ORAL_TABLET | Freq: Every day | ORAL | 3 refills | Status: DC

## 2020-07-06 NOTE — Telephone Encounter (Signed)
Patient called re: [facility] told Patient that refill was not approved for the following (showing as a No Print) and to contact Provider:  MEDICATION: Semaglutide (RYBELSUS) 3 MG TABS   PHARMACY:   [facility], [place] - [name] AT [address] Phone:  [phone]  Fax:  [phone]       HAS THE PATIENT CONTACTED [facility]?  Yes  IS THIS A 90 DAY SUPPLY : No  IS PATIENT OUT OF MEDICATION: No  IF NOT; HOW MUCH IS LEFT: Approx. 10 days  LAST APPOINTMENT DATE: @5 /10/2020  NEXT APPOINTMENT DATE:@10 /14/2022  DO WE HAVE YOUR PERMISSION TO LEAVE A DETAILED MESSAGE?: Yes  OTHER COMMENTS:    **Let patient know to contact pharmacy at the end of the day to make sure medication is ready. **  ** Please notify patient to allow 48-72 hours to process**  **Encourage patient to contact the pharmacy for refills or they can request refills through [facility]**

## 2020-08-11 ENCOUNTER — Other Ambulatory Visit: Payer: Self-pay | Admitting: Oncology

## 2020-08-12 ENCOUNTER — Encounter: Payer: Self-pay | Admitting: Oncology

## 2020-09-09 DIAGNOSIS — E119 Type 2 diabetes mellitus without complications: Secondary | ICD-10-CM | POA: Diagnosis not present

## 2020-09-09 DIAGNOSIS — D849 Immunodeficiency, unspecified: Secondary | ICD-10-CM | POA: Diagnosis not present

## 2020-09-09 DIAGNOSIS — Z940 Kidney transplant status: Secondary | ICD-10-CM | POA: Diagnosis not present

## 2020-09-09 DIAGNOSIS — K219 Gastro-esophageal reflux disease without esophagitis: Secondary | ICD-10-CM | POA: Diagnosis not present

## 2020-09-09 DIAGNOSIS — E210 Primary hyperparathyroidism: Secondary | ICD-10-CM | POA: Diagnosis not present

## 2020-09-09 DIAGNOSIS — Z0000 Encounter for general adult medical examination without abnormal findings: Secondary | ICD-10-CM | POA: Diagnosis not present

## 2020-09-11 DIAGNOSIS — N390 Urinary tract infection, site not specified: Secondary | ICD-10-CM | POA: Diagnosis not present

## 2020-09-11 DIAGNOSIS — Z940 Kidney transplant status: Secondary | ICD-10-CM | POA: Diagnosis not present

## 2020-09-15 DIAGNOSIS — E785 Hyperlipidemia, unspecified: Secondary | ICD-10-CM | POA: Diagnosis not present

## 2020-09-21 DIAGNOSIS — C50919 Malignant neoplasm of unspecified site of unspecified female breast: Secondary | ICD-10-CM | POA: Diagnosis not present

## 2020-09-21 DIAGNOSIS — E876 Hypokalemia: Secondary | ICD-10-CM | POA: Diagnosis not present

## 2020-09-21 DIAGNOSIS — E212 Other hyperparathyroidism: Secondary | ICD-10-CM | POA: Diagnosis not present

## 2020-09-21 DIAGNOSIS — Z940 Kidney transplant status: Secondary | ICD-10-CM | POA: Diagnosis not present

## 2020-09-21 DIAGNOSIS — I10 Essential (primary) hypertension: Secondary | ICD-10-CM | POA: Diagnosis not present

## 2020-09-21 DIAGNOSIS — Z9229 Personal history of other drug therapy: Secondary | ICD-10-CM | POA: Diagnosis not present

## 2020-09-21 DIAGNOSIS — N058 Unspecified nephritic syndrome with other morphologic changes: Secondary | ICD-10-CM | POA: Diagnosis not present

## 2020-09-21 DIAGNOSIS — E139 Other specified diabetes mellitus without complications: Secondary | ICD-10-CM | POA: Diagnosis not present

## 2020-09-24 ENCOUNTER — Other Ambulatory Visit: Payer: Self-pay | Admitting: Oncology

## 2020-09-24 DIAGNOSIS — Z9889 Other specified postprocedural states: Secondary | ICD-10-CM

## 2020-10-09 ENCOUNTER — Ambulatory Visit: Payer: Medicare Other | Admitting: Sports Medicine

## 2020-10-09 ENCOUNTER — Other Ambulatory Visit: Payer: Self-pay

## 2020-10-09 ENCOUNTER — Encounter: Payer: Self-pay | Admitting: Sports Medicine

## 2020-10-09 DIAGNOSIS — Q828 Other specified congenital malformations of skin: Secondary | ICD-10-CM | POA: Diagnosis not present

## 2020-10-09 DIAGNOSIS — E119 Type 2 diabetes mellitus without complications: Secondary | ICD-10-CM

## 2020-10-09 DIAGNOSIS — M79672 Pain in left foot: Secondary | ICD-10-CM | POA: Diagnosis not present

## 2020-10-09 DIAGNOSIS — M79671 Pain in right foot: Secondary | ICD-10-CM

## 2020-10-09 NOTE — Progress Notes (Signed)
Subjective: Erika Freeman is a 60 y.o. female patient who returns to office for evaluation of Right> Left foot pain secondary to callus skin. Patient reports that she is doing ok but lately the callus has buildup and has been very sore.  No other issues noted.  Fasting blood sugar not recorded.  Patient Active Problem List   Diagnosis Date Noted   Gastroesophageal reflux disease without esophagitis 02/11/2020   History of renal transplant 02/11/2020   Hyperlipidemia 02/11/2020   Immunodeficiency ([place]) 02/11/2020   Abdominal pain 07/17/2019   Diabetes ([place]) 06/20/2019   Hyperparathyroidism, primary ([place]) 03/24/2019   Hypokalemia 05/10/2017   Dysuria 04/12/2017   Renal transplant rejection 02/24/2017   Malignant neoplasm of female breast ([place]) 05/16/2016   Hypomagnesemia 02/27/2014   Hypophosphatemia 02/20/2014   Immunosuppressive management encounter following kidney transplant 02/20/2014   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 02/20/2014   Immunosuppression ([place]) 02/16/2014   Essential hypertension 07/18/2012   H/O cesarean section 07/18/2012   Encounter for monitoring tacrolimus therapy 07/18/2012   End-stage renal disease on peritoneal dialysis ([place]) 73/42/8768   Umbilical hernia s/p primary repair 01/10/2012 01/24/2012   Constipation, chronic 01/24/2012   FSGS (focal segmental glomerulosclerosis) 11/23/2011   Uterine fibromyoma 11/23/2011   Chronic kidney disease, stage V (very severe) ([place])     Current Outpatient Medications on File Prior to Visit  Medication Sig Dispense Refill   amLODipine (NORVASC) 10 MG tablet Take 10 mg by mouth daily.      anastrozole (ARIMIDEX) 1 MG tablet TAKE 1 TABLET(1 MG) BY MOUTH DAILY 90 tablet 4   aspirin 81 MG tablet Take 81 mg by mouth every morning.      atorvastatin (LIPITOR) 10 MG tablet Take 10 mg by mouth 3 (three) times a week.     cholecalciferol (VITAMIN D3) 25 MCG (1000 UNIT) tablet Take 1,000 Units by mouth daily.      cinacalcet (SENSIPAR) 30 MG tablet 1 tablet with food or after a meal     glucose blood (ACCU-CHEK AVIVA PLUS) test strip Use to check blood sugar once a day. 100 each 12   Magnesium Oxide (MAG-OXIDE) 200 MG TABS See admin instructions.     potassium chloride SA (KLOR-CON) 20 MEQ tablet Take 40 mEq by mouth 2 (two) times daily.     predniSONE (DELTASONE) 5 MG tablet Take 5 mg by mouth every morning.      promethazine (PHENERGAN) 12.5 MG tablet Take 12.5 mg by mouth every 6 (six) hours as needed.     RYBELSUS 7 MG TABS Take 1 tablet by mouth daily.     Semaglutide (RYBELSUS) 3 MG TABS Take 3 mg by mouth daily. 30 tablet 3   tacrolimus (PROGRAF) 1 MG capsule Take by mouth 2 (two) times daily. Takes 7mg  in the AM and 6mg  in the PM.     No current facility-administered medications on file prior to visit.    No Known Allergies  Objective:  General: Alert and oriented x3 in no acute distress  Dermatology: Keratotic lesion present submet 3/4 bilateral with skin lines transversing the lesion right greater than left, pain is present with direct pressure to the lesion with a central nucleated core noted, no webspace macerations, no ecchymosis bilateral, all nails x 10 are well manicured.  Vascular: Dorsalis Pedis and Posterior Tibial pedal pulses 1/4, Capillary Fill Time 3 seconds, + pedal hair growth bilateral, no edema bilateral lower extremities, Temperature gradient within normal limits.  Neurology: Gross sensation intact  via light touch bilateral.  Musculoskeletal: Mild tenderness with palpation at the keratotic lesion site on Right>Left, hammertoe, pes planus noted, muscular strength 5/5 in all groups without pain or limitation on range of motion.  Assessment and Plan: Problem List Items Addressed This Visit   None Visit Diagnoses     Porokeratosis    -  Primary   Diabetes mellitus without complication ([place])       Foot pain, bilateral            -Complete examination  performed -Re-Discussed treatment options for keratosis in the setting of diabetes -Parred keratoic lesion using a chisel x2 treated the area withSalinocaine covered with Band-Aid -At no charge smooth nails using a rotary bur -Continue with daily skin emollients; recommend foot miracle cream like before -Advised patient to continue with good supportive shoes daily for foot type with offloading padding and advised patient she can look for more padding on [place] -Return in 10-12 weeks for diabetic callus trim like previous.  Landis Martins, DPM

## 2020-10-27 DIAGNOSIS — D2239 Melanocytic nevi of other parts of face: Secondary | ICD-10-CM | POA: Diagnosis not present

## 2020-10-27 DIAGNOSIS — L814 Other melanin hyperpigmentation: Secondary | ICD-10-CM | POA: Diagnosis not present

## 2020-10-27 DIAGNOSIS — D225 Melanocytic nevi of trunk: Secondary | ICD-10-CM | POA: Diagnosis not present

## 2020-10-27 DIAGNOSIS — L918 Other hypertrophic disorders of the skin: Secondary | ICD-10-CM | POA: Diagnosis not present

## 2020-10-28 ENCOUNTER — Ambulatory Visit
Admission: RE | Admit: 2020-10-28 | Discharge: 2020-10-28 | Disposition: A | Discharge status: Home / Self Care | Payer: Medicare Other | Source: Ambulatory Visit | Attending: Oncology | Admitting: Oncology

## 2020-10-28 ENCOUNTER — Other Ambulatory Visit: Payer: Self-pay

## 2020-10-28 DIAGNOSIS — R922 Inconclusive mammogram: Secondary | ICD-10-CM | POA: Diagnosis not present

## 2020-10-28 DIAGNOSIS — Z9889 Other specified postprocedural states: Secondary | ICD-10-CM

## 2020-11-03 ENCOUNTER — Other Ambulatory Visit: Payer: Self-pay | Admitting: Endocrinology

## 2020-11-15 NOTE — Progress Notes (Signed)
[place]  [phone] [phone]     ID: Erika Freeman DOB: 09-06-1960  MR#: [id number]  ASN#:[id number]  Patient Care Team: Scheryl Marten, [place] as PCP - General (Internal Medicine) [name], MD (Inactive) as Consulting Physician (Nephrology) [name], MD as Consulting Physician (Obstetrics and Gynecology) [name], MD as Consulting Physician (General Surgery) Jozee Hammer, [name], MD as Consulting Physician (Oncology) [name], MD as Consulting Physician (Radiation Oncology) [name], MD as Referring Physician (Surgery) [name], DO (Internal Medicine) OTHER MD:  CHIEF COMPLAINT: Estrogen receptor positive breast cancer  CURRENT TREATMENT: anastrozole    INTERVAL HISTORY:  Erika Freeman returns today for follow-up of her estrogen receptor positive breast cancer.  She is accompanied by her husband  She continues on anastrozole daily with good tolerance.  She has mild occasional hot flashes as the only side effect  Since her last visit, she underwent bone density screening on 12/17/2019 showing a T-score of -0.1, which is considered normal.  She also underwent bilateral diagnostic mammography with tomography at [facility] on 10/28/2020 showing: breast density category C; no evidence of malignancy in either breast.   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  She walks most days, usually between 7 and 10,000 steps a day.  She tells me her diabetes is under good control but does not know her current A1c.  She is a detailed review of systems today was otherwise stable   COVID 19 VACCINATION STATUS: Status post Pfizer x4 as of September 2022   BREAST CANCER HISTORY: From the original intake note:  "Erika Freeman" herself noted a mass in her left breast. She ignored it because she thought it was related to menstruation. However as  it persisted through 1. She brought it to Dr. [name] attention and on 05/10/2016 she underwent left diagnostic mammography with tomography [facility] The breast density was category C. In the left breast lower central area there was a new circumscribed mass. This was firm and fixed in the lower inner left breast at middle depth. Ultrasonography confirmed a 2.6 cm hypoechoic irregular mass at the 6:30 o'clock radiant 3 cm from the nipple. There was also a 0.5 cm nodule 1.4 cm medial to the mass just described.  Biopsy of the left breast mass in question 05/10/2016 showed([id number]) invasive ductal carcinoma, grade 3, estrogen receptor 70% positive with weak staining intensity, progesterone receptor negative, with no HER-2 amplification, the signals ratio being 1.52 and the number per cell 2.35. The proliferation marker was 90%.  Her subsequent history is as detailed below  OF NOTE: The patient has a history of focal segmental glomerular sclerosis with end-stage renal disease and is status post unrelated donor renal transplant 04/15/2013 as part of appeared donor exchange with her husband donating one kidney, the other harvested from a 59 year old [place] man. She continues on immunosuppression with mycophenolate and tacrolimus.   PAST MEDICAL HISTORY: Past Medical History:  Diagnosis Date   Anemia    CKD (chronic kidney disease), stage III ([place])    nephologist-- dr Ria Comment [facility])  and transplant clinic [facility]   Diabetes mellitus without complication ([place])    Diverticulosis of colon    GERD (gastroesophageal reflux disease)    03-21-2019 -- per pt w/ spicy foods and at night time occasionally,  chews watermelon gum and pt states it  resolves   Heart murmur    03-21-2019  per pt was told had murmur some years ago, pt is asymptomatic (last echo in [facility] 05-31-2016  trivial regurg MR and TR)   History of end stage renal disease     secondary to FSGS with PD until kidney transplant 12/ 2015   History of radiation therapy 12/20/16  to 01/17/17   left breast 50.4 gy in 28 fractions   Hypercalcemia    pt had recent hospital admision @[facility]  (in care everywhere) for acute kidney injury due to hypercalcemia and hypomagnesium,  resolved with medication, lov w/ transplant clinic @[facility]  02-14-2019  calcium  normal per lab (care everywhere)    Hypertension    followed by transplant clinic @[facility] ---  (pt had stress echo 07-11-2013, negative for ischemia, in care everywhere)   Hypokalemia    pt hospital admission    Hypomagnesemia    Immunosuppression ([place])    due to kidney transplant 02-12-2014   Kidney transplant status, living unrelated donor followed by transplant clinic @[facility] -- Dr Delane Ginger. [id number]   02-12-2014  @[facility]    Malignant neoplasm of breast (female) [facility]) oncologist--- dr Jana Hakim (lov in epic no recurrence 01-18-2019)   dx 03/ 2018  IDC Stage IIB ---  completed chemotherapy 10-11-2016;  s/p left breast lumpectomy with node dissection 11-14-2016;  completed radiation 01-17-2017   Personal history of chemotherapy    left breast cancer--  06-01-2016  to 10-11-2016   Personal history of radiation therapy    Primary hyperparathyroidism [facility])    12/ 2020 w/ hypercalcimia  (pt has hx of hyperparathyoidism s/p right inferior parathyroidectomy 11-27-2006 @[facility] ;  per path adenoma)   Uterine fibroid     PAST SURGICAL HISTORY: Past Surgical History:  Procedure Laterality Date   BREAST BIOPSY     BREAST LUMPECTOMY Left    2018   BREAST LUMPECTOMY WITH RADIOACTIVE SEED AND SENTINEL LYMPH NODE BIOPSY Left 11/14/2016   Procedure: LEFT BREAST RADIOACTIVE SEED BRACKETED LUMPECTOMY, LEFT AXILLARY SENTINEL LYMPH NODE BIOPSY WITH BLUE DYE INJECTION;  Surgeon: [name], MD;  Location: [facility];  Service: General;  Laterality: Left;   BREAST SURGERY  2012   left - fibroadenoma   CAPD INSERTION  01/10/2012   Procedure: LAPAROSCOPIC  INSERTION CONTINUOUS AMBULATORY PERITONEAL DIALYSIS  (CAPD) CATHETER;  Surgeon: Adin Hector, MD;  Location: [facility];  Service: General;  Laterality: N/A;  LAPAROSCOPIC CAPD CATHETER PLACEMENT OMENTOPEXY   [place] Right 11-27-2006   dr gerkin @[facility]    right inferior  (adenoma)   PARATHYROIDECTOMY Right 03/25/2019   Procedure: RIGHT SUPERIOR PARATHYROIDECTOMY;  Surgeon: Armandina Gemma, MD;  Location: [facility];  Service: General;  Laterality: Right;   PORT-A-CATH REMOVAL Right 11/14/2016   Procedure: REMOVAL PORT-A-CATH;  Surgeon: [name], MD;  Location: [facility];  Service: General;  Laterality: Right;   PORTACATH PLACEMENT Right 05/24/2016   Procedure: INSERTION PORT-A-CATH WITH [place];  Surgeon: [name], MD;  Location: [facility];  Service: General;  Laterality: Right;   RENAL BIOPSY  2011   TUBAL LIGATION     UMBILICAL HERNIA REPAIR  01/10/2012   Procedure: HERNIA REPAIR UMBILICAL ADULT;  Surgeon: Adin Hector, MD;  Location: [place] [facility];  Service: General;  Laterality: N/A;    FAMILY HISTORY Family History  Problem Relation Age of Onset   Cancer Maternal  Uncle        lung   Cancer Cousin        colon   Diabetes Maternal Grandmother    Colon cancer Neg Hx    Colon polyps Neg Hx    Esophageal cancer Neg Hx    Stomach cancer Neg Hx    Rectal cancer Neg Hx   The patient has very little information regarding her father. Her mother is living, 20 years old as of March 2018. The patient had one full brother, diagnosed with prostate cancer in his 83s. The patient has 2 half-brothers and 1 half-sister. There is no history of breast or ovarian cancer in the family to the patient's knowledge    GYNECOLOGIC HISTORY:  Patient's last menstrual period was 04/27/2016 (exact date).  Menarche age 55, first live birth age 11, the patient is GX P3. Her periods are  regular, the last 5 or 6 days, with no heavy days. 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 Her husband [name] works at home [employer]. Son [name] lives in [place] and work as Freight forwarder of a Therapist, art. Son [name] lives in [place] and is a Air traffic controller. Son [name] lives in [place] and is studying based trombone. The patient has 2 grandchildren. She attends a local [employer] DIRECTIVES: In the absence of any documents to the contrary her husband is her healthcare     HEALTH MAINTENANCE: Social History   Tobacco Use   Smoking status: Never   Smokeless tobacco: Never  Vaping Use   Vaping Use: Never used  Substance Use Topics   Alcohol use: No   Drug use: Never     Colonoscopy:  PAP:  Bone density:   No Known Allergies  Current Outpatient Medications  Medication Sig Dispense Refill   amLODipine (NORVASC) 10 MG tablet Take 10 mg by mouth daily.      anastrozole (ARIMIDEX) 1 MG tablet TAKE 1 TABLET(1 MG) BY MOUTH DAILY 90 tablet 4   aspirin 81 MG tablet Take 81 mg by mouth every morning.      atorvastatin (LIPITOR) 10 MG tablet Take 10 mg by mouth 3 (three) times a week.     cholecalciferol (VITAMIN D3) 25 MCG (1000 UNIT) tablet Take 1,000 Units by mouth daily.     cinacalcet (SENSIPAR) 30 MG tablet 1 tablet with food or after a meal     glucose blood (ACCU-CHEK AVIVA PLUS) test strip Use to check blood sugar once a day. 100 each 12   Magnesium Oxide (MAG-OXIDE) 200 MG TABS See admin instructions.     potassium chloride SA (KLOR-CON) 20 MEQ tablet Take 40 mEq by mouth 2 (two) times daily.     predniSONE (DELTASONE) 5 MG tablet Take 5 mg by mouth every morning.      promethazine (PHENERGAN) 12.5 MG tablet Take 12.5 mg by mouth every 6 (six) hours as needed.     RYBELSUS 3 MG TABS TAKE 1 TABLET BY MOUTH DAILY 30 tablet 3    RYBELSUS 7 MG TABS Take 1 tablet by mouth daily.     tacrolimus (PROGRAF) 1 MG capsule Take by mouth 2 (two) times daily. Takes 56m in the AM and 623min the PM.     No current facility-administered medications for this visit.    OBJECTIVE:  Vitals:   11/16/20 1357  BP:  125/80  Pulse: 83  Resp: 15  Temp: (!) 97.5 F (36.4 C)  SpO2: 99%     Body mass index is 25.11 kg/m.  Filed Weights   11/16/20 1357  Weight: 150 lb 14.4 oz (68.4 kg)   Filed Weights   11/16/20 1357  Weight: 150 lb 14.4 oz (68.4 kg)   ECOG FS:1 - Symptomatic but completely ambulatory   Sclerae unicteric, EOMs intact Wearing a mask No cervical or supraclavicular adenopathy Lungs no rales or rhonchi Heart regular rate and rhythm Abd soft, nontender, positive bowel sounds MSK no focal spinal tenderness, no upper extremity lymphedema Neuro: nonfocal, well oriented, appropriate affect Breasts: The right breast is benign.  The left breast is status post lumpectomy followed by radiation.  There is no evidence of local recurrence.  Both axillae are benign   LAB RESULTS:  CMP     Component Value Date/Time   NA 142 11/13/2019 1408   NA 142 02/14/2017 1012   K 3.5 11/13/2019 1408   K 2.8 (LL) 02/14/2017 1012   CL 106 11/13/2019 1408   CO2 28 11/13/2019 1408   CO2 30 (H) 02/14/2017 1012   GLUCOSE 174 (H) 11/13/2019 1408   GLUCOSE 104 02/14/2017 1012   BUN 14 11/13/2019 1408   BUN 14.1 02/14/2017 1012   CREATININE 1.26 (H) 11/13/2019 1408   CREATININE 1.2 (H) 02/14/2017 1012   CALCIUM 10.2 11/13/2019 1408   CALCIUM 9.6 02/14/2017 1012   PROT 7.7 11/13/2019 1408   PROT 7.8 02/14/2017 1012   ALBUMIN 4.1 11/13/2019 1408   ALBUMIN 4.4 02/14/2017 1012   AST 18 11/13/2019 1408   AST 15 02/14/2017 1012   ALT 18 11/13/2019 1408   ALT 15 02/14/2017 1012   ALKPHOS 92 11/13/2019 1408   ALKPHOS 72 02/14/2017 1012   BILITOT 0.4 11/13/2019 1408   BILITOT 0.53 02/14/2017 1012   GFRNONAA 47 (L) 11/13/2019  1408   GFRAA 54 (L) 11/13/2019 1408    No results found for: TOTALPROTELP, ALBUMINELP, A1GS, A2GS, BETS, BETA2SER, GAMS, MSPIKE, SPEI  No results found for: Nils Pyle, [facility]  Lab Results  Component Value Date   WBC 13.3 (H) 11/16/2020   NEUTROABS 10.9 (H) 11/16/2020   HGB 13.6 11/16/2020   HCT 42.1 11/16/2020   MCV 86.8 11/16/2020   PLT 201 11/16/2020      Chemistry      Component Value Date/Time   NA 142 11/13/2019 1408   NA 142 02/14/2017 1012   K 3.5 11/13/2019 1408   K 2.8 (LL) 02/14/2017 1012   CL 106 11/13/2019 1408   CO2 28 11/13/2019 1408   CO2 30 (H) 02/14/2017 1012   BUN 14 11/13/2019 1408   BUN 14.1 02/14/2017 1012   CREATININE 1.26 (H) 11/13/2019 1408   CREATININE 1.2 (H) 02/14/2017 1012      Component Value Date/Time   CALCIUM 10.2 11/13/2019 1408   CALCIUM 9.6 02/14/2017 1012   ALKPHOS 92 11/13/2019 1408   ALKPHOS 72 02/14/2017 1012   AST 18 11/13/2019 1408   AST 15 02/14/2017 1012   ALT 18 11/13/2019 1408   ALT 15 02/14/2017 1012   BILITOT 0.4 11/13/2019 1408   BILITOT 0.53 02/14/2017 1012       No results found for: LABCA2  No components found for: [id number]  No results for input(s): INR in the last 168 hours.  Urinalysis    Component Value Date/Time   COLORURINE YELLOW 11/24/2006 [place] 11/24/2006 1539  LABSPEC 1.018 11/24/2006 1539   PHURINE 6.0 11/24/2006 1539   GLUCOSEU NEGATIVE 11/24/2006 1539   HGBUR SMALL (A) 11/24/2006 1539   BILIRUBINUR NEGATIVE 11/24/2006 1539   KETONESUR NEGATIVE 11/24/2006 1539   PROTEINUR 100 (A) 11/24/2006 1539   UROBILINOGEN 1.0 11/24/2006 1539   NITRITE NEGATIVE 11/24/2006 1539   LEUKOCYTESUR MODERATE (A) 11/24/2006 1539    STUDIES: MM DIAG BREAST TOMO BILATERAL  Result Date: 10/28/2020 CLINICAL DATA:  60 year old female status post malignant left lumpectomy with radiation and chemotherapy in 2018. EXAM: DIGITAL DIAGNOSTIC BILATERAL MAMMOGRAM WITH  TOMOSYNTHESIS AND CAD TECHNIQUE: Bilateral digital diagnostic mammography and breast tomosynthesis was performed. The images were evaluated with computer-aided detection. COMPARISON:  Previous exam(s). ACR Breast Density Category c: The breast tissue is heterogeneously dense, which may obscure small masses. FINDINGS: Stable posttreatment changes in the subareolar left breast. No new or suspicious findings in either breast. The parenchymal pattern is stable. IMPRESSION: 1. No mammographic evidence of malignancy in either breast. 2. Stable left breast posttreatment changes. RECOMMENDATION: Per protocol, as the patient is now 2 or more years status post lumpectomy, she may return to annual screening mammography in 1 year. However, given the history of breast cancer, the patient remains eligible for annual diagnostic mammography if preferred. I have discussed the findings and recommendations with the patient. If applicable, a reminder letter will be sent to the patient regarding the next appointment. BI-RADS CATEGORY  2: Benign. Electronically Signed   By: Kristopher Oppenheim M.D.   On: 10/28/2020 10:47     ELIGIBLE FOR AVAILABLE RESEARCH PROTOCOL: no  ASSESSMENT: 60 y.o. [place] woman status post left breast lower inner quadrant biopsy 05/10/2016 for a clinical  T2 N0, prognostic stage IIB invaside ductal carcinoma, grade 3, weekly estrogen receptor positive, progesterone receptor negative, with no HER-2 amplification, and the MIB-1 at 90%  (a) biopsy of the 0.6 mm satellite at the 6:30 o'clock radiant in the left breast 06/02/2016 showed invasive ductal carcinoma, grade 3, estrogen receptor 20% positive, with weak staining intensity, progesterone receptor negative with no HER-2 amplification, and an MIB-1 of 70% (identical to larger mass)   (1) neoadjuvant chemotherapy consisting of doxorubicin and cyclophosphamide in dose dense fashion 4 started 06/01/2016, completed 07/12/2016  followed by Paclitaxel weekly  12 started 07/26/2016, completed 10/11/2016  (2) definitive surgery 11/14/2016 found a residual pT1a pN0 area of invasive ductal carcinoma (0.2 mm), with negative margins.  (3) adjuvant radiation 12/20/16-01/17/17  Site/dose:   Left Breast, 50.4 Gy total delivered in 28 fractions   (4) started anastrozole 02/28/2017  (a) left heel DEXA scan 11/24/2004 was normal  (b) renal feels any additional radiation may be detrimental so bone density not updated  (C) repeat bone density 12/17/2019 showed a T score of -0.1  (5) hypercalcemia with worsening renal function  (a) denosumab 60 mg and IV fluids given 08/03/2018  (b) bone scan 08/10/2018 shows no evidence of metastatic disease.   PLAN: Erika Freeman is now 4 years out from definitive surgery for her breast cancer with no evidence of disease recurrence.  This is very favorable.  She is tolerating anastrozole well and she will complete 5 years December 2023.  I reviewed her bone density scan results which was very favorable.  I encouraged her to continue walking most days which is what she does chiefly for exercise.  She is wondering what she can do for hair loss.  I gave her information on oral low-dose minoxidil to discuss with her renal doctor.  If there are no renal contraindications that appears to be effective and it is safe as far as her breast cancer is concerned.  She will see is 1 last time October 2023 and likely will "graduate" at that time.  Total encounter time 20 minutes.* Total encounter time: 20 minutes*   Luismario Coston C. Kendall Arnell, MD 11/16/20 2:10 PM Medical Oncology and Hematology [facility] [place], [place] [address] Tel. [phone]    Fax. [phone]   I, Wilburn Mylar, am acting as scribe for Dr. [name]. Teagyn Fishel.  I, Lurline Del MD, have reviewed the above documentation for accuracy and completeness, and I agree with the above.    *Total Encounter Time as defined by the  Centers for Medicare and Medicaid Services includes, in addition to the face-to-face time of a patient visit (documented in the note above) non-face-to-face time: obtaining and reviewing outside history, ordering and reviewing medications, tests or procedures, care coordination (communications with other health care professionals or caregivers) and documentation in the medical record.

## 2020-11-16 ENCOUNTER — Other Ambulatory Visit: Payer: Self-pay | Admitting: *Deleted

## 2020-11-16 ENCOUNTER — Inpatient Hospital Stay: Payer: Medicare Other | Attending: Oncology | Admitting: Oncology

## 2020-11-16 ENCOUNTER — Inpatient Hospital Stay: Payer: Medicare Other

## 2020-11-16 ENCOUNTER — Other Ambulatory Visit: Payer: Self-pay

## 2020-11-16 VITALS — BP 125/80 | HR 83 | Temp 97.5°F | Resp 15 | Ht 65.0 in | Wt 150.9 lb

## 2020-11-16 DIAGNOSIS — Z170 Estrogen receptor positive status [ER+]: Secondary | ICD-10-CM | POA: Diagnosis not present

## 2020-11-16 DIAGNOSIS — Z940 Kidney transplant status: Secondary | ICD-10-CM | POA: Insufficient documentation

## 2020-11-16 DIAGNOSIS — C50919 Malignant neoplasm of unspecified site of unspecified female breast: Secondary | ICD-10-CM

## 2020-11-16 DIAGNOSIS — N186 End stage renal disease: Secondary | ICD-10-CM | POA: Diagnosis not present

## 2020-11-16 DIAGNOSIS — C50112 Malignant neoplasm of central portion of left female breast: Secondary | ICD-10-CM | POA: Diagnosis not present

## 2020-11-16 DIAGNOSIS — Z79811 Long term (current) use of aromatase inhibitors: Secondary | ICD-10-CM | POA: Insufficient documentation

## 2020-11-16 DIAGNOSIS — Z79899 Other long term (current) drug therapy: Secondary | ICD-10-CM | POA: Insufficient documentation

## 2020-11-16 LAB — CMP (CANCER CENTER ONLY)
ALT: 17 U/L (ref 0–44)
AST: 17 U/L (ref 15–41)
Albumin: 4.3 g/dL (ref 3.5–5.0)
Alkaline Phosphatase: 81 U/L (ref 38–126)
Anion gap: 11 (ref 5–15)
BUN: 20 mg/dL (ref 6–20)
CO2: 26 mmol/L (ref 22–32)
Calcium: 10.6 mg/dL — ABNORMAL HIGH (ref 8.9–10.3)
Chloride: 106 mmol/L (ref 98–111)
Creatinine: 1.12 mg/dL — ABNORMAL HIGH (ref 0.44–1.00)
GFR, Estimated: 57 mL/min — ABNORMAL LOW (ref >60)
Glucose, Bld: 113 mg/dL — ABNORMAL HIGH (ref 70–99)
Potassium: 3.6 mmol/L (ref 3.5–5.1)
Sodium: 143 mmol/L (ref 135–145)
Total Bilirubin: 0.5 mg/dL (ref 0.3–1.2)
Total Protein: 7.9 g/dL (ref 6.5–8.1)

## 2020-11-16 LAB — CBC WITH DIFFERENTIAL (CANCER CENTER ONLY)
Abs Immature Granulocytes: 0.04 10*3/uL (ref 0.00–0.07)
Basophils Absolute: 0.1 10*3/uL (ref 0.0–0.1)
Basophils Relative: 0 %
Eosinophils Absolute: 0.2 10*3/uL (ref 0.0–0.5)
Eosinophils Relative: 1 %
HCT: 42.1 % (ref 36.0–46.0)
Hemoglobin: 13.6 g/dL (ref 12.0–15.0)
Immature Granulocytes: 0 %
Lymphocytes Relative: 12 %
Lymphs Abs: 1.6 10*3/uL (ref 0.7–4.0)
MCH: 28 pg (ref 26.0–34.0)
MCHC: 32.3 g/dL (ref 30.0–36.0)
MCV: 86.8 fL (ref 80.0–100.0)
Monocytes Absolute: 0.6 10*3/uL (ref 0.1–1.0)
Monocytes Relative: 4 %
Neutro Abs: 10.9 10*3/uL — ABNORMAL HIGH (ref 1.7–7.7)
Neutrophils Relative %: 83 %
Platelet Count: 201 10*3/uL (ref 150–400)
RBC: 4.85 MIL/uL (ref 3.87–5.11)
RDW: 13 % (ref 11.5–15.5)
WBC Count: 13.3 10*3/uL — ABNORMAL HIGH (ref 4.0–10.5)
nRBC: 0 % (ref 0.0–0.2)

## 2020-12-11 ENCOUNTER — Other Ambulatory Visit: Payer: Self-pay

## 2020-12-11 ENCOUNTER — Ambulatory Visit (INDEPENDENT_AMBULATORY_CARE_PROVIDER_SITE_OTHER): Payer: Medicare Other | Admitting: Endocrinology

## 2020-12-11 VITALS — BP 122/80 | HR 84 | Ht 65.0 in | Wt 150.6 lb

## 2020-12-11 DIAGNOSIS — N1831 Chronic kidney disease, stage 3a: Secondary | ICD-10-CM

## 2020-12-11 DIAGNOSIS — E1122 Type 2 diabetes mellitus with diabetic chronic kidney disease: Secondary | ICD-10-CM

## 2020-12-11 LAB — POCT GLYCOSYLATED HEMOGLOBIN (HGB A1C): Hemoglobin A1C: 5.6 % (ref 4.0–5.6)

## 2020-12-11 NOTE — Progress Notes (Signed)
Subjective:    Patient ID: Erika Freeman, female    DOB: [birth year], 60 y.o.   MRN: [id number]  HPI Pt returns for f/u of diabetes mellitus:  DM type: 2 Dx'ed: 0272 Complications: CRI (transplant 2015).    Therapy: Rybelsus GDM: never DKA: never Severe hypoglycemia: never.   Pancreatitis: never Pancreatic imaging: never.   SDOH: none. Other: she has never been on insulin; CRI limits rx options; fructosamine converts to A1c slightly higher than A1c itself.   Interval history: pt says cbg's are well-controlled.  She takes meds as rx'ed.  pt states she feels well in general. Past Medical History:  Diagnosis Date   Anemia    CKD (chronic kidney disease), stage III ([place])    nephologist-- dr Ria Comment [facility])  and transplant clinic [facility]   Diabetes mellitus without complication ([place])    Diverticulosis of colon    GERD (gastroesophageal reflux disease)    03-21-2019 -- per pt w/ spicy foods and at night time occasionally,  chews watermelon gum and pt states it resolves   Heart murmur    03-21-2019  per pt was told had murmur some years ago, pt is asymptomatic (last echo in [facility] 05-31-2016  trivial regurg MR and TR)   History of end stage renal disease    secondary to FSGS with PD until kidney transplant 12/ 2015   History of radiation therapy 12/20/16  to 01/17/17   left breast 50.4 gy in 28 fractions   Hypercalcemia    pt had recent hospital admision @[facility]  (in care everywhere) for acute kidney injury due to hypercalcemia and hypomagnesium,  resolved with medication, lov w/ transplant clinic @[facility]  02-14-2019  calcium  normal per lab (care everywhere)    Hypertension    followed by transplant clinic @[facility] ---  (pt had stress echo 07-11-2013, negative for ischemia, in care everywhere)   Hypokalemia    pt hospital admission    Hypomagnesemia    Immunosuppression ([place])    due to kidney transplant 02-12-2014   Kidney transplant status, living unrelated donor  followed by transplant clinic @[facility] -- Dr Delane Ginger. [id number]   02-12-2014  @[facility]    Malignant neoplasm of breast (female) [facility]) oncologist--- dr Jana Hakim (lov in epic no recurrence 01-18-2019)   dx 03/ 2018  IDC Stage IIB ---  completed chemotherapy 10-11-2016;  s/p left breast lumpectomy with node dissection 11-14-2016;  completed radiation 01-17-2017   Personal history of chemotherapy    left breast cancer--  06-01-2016  to 10-11-2016   Personal history of radiation therapy    Primary hyperparathyroidism [facility])    12/ 2020 w/ hypercalcimia  (pt has hx of hyperparathyoidism s/p right inferior parathyroidectomy 11-27-2006 @[facility] ;  per path adenoma)   Uterine fibroid     Past Surgical History:  Procedure Laterality Date   BREAST BIOPSY     BREAST LUMPECTOMY Left    2018   BREAST LUMPECTOMY WITH RADIOACTIVE SEED AND SENTINEL LYMPH NODE BIOPSY Left 11/14/2016   Procedure: LEFT BREAST RADIOACTIVE SEED BRACKETED LUMPECTOMY, LEFT AXILLARY SENTINEL LYMPH NODE BIOPSY WITH BLUE DYE INJECTION;  Surgeon: [name], MD;  Location: [place];  Service: General;  Laterality: Left;   BREAST SURGERY  2012   left - fibroadenoma   CAPD INSERTION  01/10/2012   Procedure: LAPAROSCOPIC INSERTION CONTINUOUS AMBULATORY PERITONEAL DIALYSIS  (CAPD) CATHETER;  Surgeon: Adin Hector, MD;  Location: [place];  Service: General;  Laterality: N/A;  LAPAROSCOPIC CAPD CATHETER PLACEMENT OMENTOPEXY   CESAREAN  SECTION  1995   COLONOSCOPY     DILATION AND CURETTAGE OF UTERUS  2000   KIDNEY TRANSPLANT     PARATHYROIDECTOMY Right 11-27-2006   dr gerkin @[facility]    right inferior  (adenoma)   PARATHYROIDECTOMY Right 03/25/2019   Procedure: RIGHT SUPERIOR PARATHYROIDECTOMY;  Surgeon: Armandina Gemma, MD;  Location: [place];  Service: General;  Laterality: Right;   PORT-A-CATH REMOVAL Right 11/14/2016   Procedure: REMOVAL PORT-A-CATH;  Surgeon: [name], MD;  Location: [place];  Service: General;  Laterality:  Right;   PORTACATH PLACEMENT Right 05/24/2016   Procedure: INSERTION PORT-A-CATH WITH [place];  Surgeon: [name], MD;  Location: [place];  Service: General;  Laterality: Right;   RENAL BIOPSY  2011   TUBAL LIGATION     UMBILICAL HERNIA REPAIR  01/10/2012   Procedure: HERNIA REPAIR UMBILICAL ADULT;  Surgeon: Adin Hector, MD;  Location: [place];  Service: General;  Laterality: N/A;    Social History   Socioeconomic History   Marital status: Married    Spouse name: Not on file   Number of children: 3   Years of education: Not on file   Highest education level: Not on file  Occupational History   Not on file  Tobacco Use   Smoking status: Never   Smokeless tobacco: Never  Vaping Use   Vaping Use: Never used  Substance and Sexual Activity   Alcohol use: No   Drug use: Never   Sexual activity: Not on file  Other Topics Concern   Not on file  Social History Narrative   Not on file   Social Determinants of Health   Financial Resource Strain: Not on file  Food Insecurity: Not on file  Transportation Needs: Not on file  Physical Activity: Not on file  Stress: Not on file  Social Connections: Not on file  Intimate Partner Violence: Not on file    Current Outpatient Medications on File Prior to Visit  Medication Sig Dispense Refill   amLODipine (NORVASC) 10 MG tablet Take 10 mg by mouth daily.      anastrozole (ARIMIDEX) 1 MG tablet TAKE 1 TABLET(1 MG) BY MOUTH DAILY 90 tablet 4   aspirin 81 MG tablet Take 81 mg by mouth every morning.      atorvastatin (LIPITOR) 10 MG tablet Take 10 mg by mouth 3 (three) times a week.     cholecalciferol (VITAMIN D3) 25 MCG (1000 UNIT) tablet Take 1,000 Units by mouth daily.     cinacalcet (SENSIPAR) 30 MG tablet 1 tablet with food or after a meal     glucose blood (ACCU-CHEK AVIVA PLUS) test strip Use to check blood sugar once a day. 100 each 12   Magnesium Oxide (MAG-OXIDE) 200 MG TABS See admin instructions.     potassium chloride  SA (KLOR-CON) 20 MEQ tablet Take 40 mEq by mouth 2 (two) times daily.     predniSONE (DELTASONE) 5 MG tablet Take 5 mg by mouth every morning.      promethazine (PHENERGAN) 12.5 MG tablet Take 12.5 mg by mouth every 6 (six) hours as needed.     RYBELSUS 3 MG TABS TAKE 1 TABLET BY MOUTH DAILY 30 tablet 3   tacrolimus (PROGRAF) 1 MG capsule Take by mouth 2 (two) times daily. Takes 7mg  in the AM and 6mg  in the PM.     No current facility-administered medications on file prior to visit.    No Known Allergies  Family History  Problem Relation Age of Onset   Cancer Maternal Uncle        lung   Cancer Cousin        colon   Diabetes Maternal Grandmother    Colon cancer Neg Hx    Colon polyps Neg Hx    Esophageal cancer Neg Hx    Stomach cancer Neg Hx    Rectal cancer Neg Hx     BP 122/80 (BP Location: Right Arm, Patient Position: Sitting, Cuff Size: Normal)   Pulse 84   Ht 5\' 5"  (1.651 m)   Wt 150 lb 9.6 oz (68.3 kg)   LMP 04/27/2016 (Exact Date)   SpO2 96%   BMI 25.06 kg/m    Review of Systems     Objective:   Physical Exam Pulses: dorsalis pedis intact bilat.   MSK: no deformity of the feet CV: no leg edema Skin:  no ulcer on the feet.  normal color and temp on the feet. Neuro: sensation is intact to touch on the feet  Lab Results  Component Value Date   CREATININE 1.12 (H) 11/16/2020   BUN 20 11/16/2020   NA 143 11/16/2020   K 3.6 11/16/2020   CL 106 11/16/2020   CO2 26 11/16/2020    Lab Results  Component Value Date   HGBA1C 5.6 12/11/2020      Assessment & Plan:  Type 2 DM: well-controlled  Patient Instructions  check your blood sugar once a day.  vary the time of day when you check, between before the 3 meals, and at bedtime.  also check if you have symptoms of your blood sugar being too high or too low.  please keep a record of the readings and bring it to your next appointment here (or you can bring the meter itself).  You can write it on any piece of  paper.  please call us sooner if your blood sugar goes below 70, or if you have a lot of readings over 200.   Please continue the same Rybelsus Please come back for a follow-up appointment in 6 months.

## 2020-12-11 NOTE — Patient Instructions (Signed)
check your blood sugar once a day.  vary the time of day when you check, between before the 3 meals, and at bedtime.  also check if you have symptoms of your blood sugar being too high or too low.  please keep a record of the readings and bring it to your next appointment here (or you can bring the meter itself).  You can write it on any piece of paper.  please call us sooner if your blood sugar goes below 70, or if you have a lot of readings over 200.   Please continue the same Rybelsus Please come back for a follow-up appointment in 6 months.

## 2020-12-15 DIAGNOSIS — Z940 Kidney transplant status: Secondary | ICD-10-CM | POA: Diagnosis not present

## 2020-12-23 DIAGNOSIS — N058 Unspecified nephritic syndrome with other morphologic changes: Secondary | ICD-10-CM | POA: Diagnosis not present

## 2020-12-23 DIAGNOSIS — C50919 Malignant neoplasm of unspecified site of unspecified female breast: Secondary | ICD-10-CM | POA: Diagnosis not present

## 2020-12-23 DIAGNOSIS — Z940 Kidney transplant status: Secondary | ICD-10-CM | POA: Diagnosis not present

## 2020-12-23 DIAGNOSIS — Z9229 Personal history of other drug therapy: Secondary | ICD-10-CM | POA: Diagnosis not present

## 2020-12-23 DIAGNOSIS — E139 Other specified diabetes mellitus without complications: Secondary | ICD-10-CM | POA: Diagnosis not present

## 2020-12-23 DIAGNOSIS — I10 Essential (primary) hypertension: Secondary | ICD-10-CM | POA: Diagnosis not present

## 2020-12-23 DIAGNOSIS — I8390 Asymptomatic varicose veins of unspecified lower extremity: Secondary | ICD-10-CM | POA: Diagnosis not present

## 2020-12-23 DIAGNOSIS — E212 Other hyperparathyroidism: Secondary | ICD-10-CM | POA: Diagnosis not present

## 2020-12-23 DIAGNOSIS — E876 Hypokalemia: Secondary | ICD-10-CM | POA: Diagnosis not present

## 2021-01-08 ENCOUNTER — Ambulatory Visit: Payer: Medicare Other | Admitting: Sports Medicine

## 2021-01-08 ENCOUNTER — Encounter: Payer: Self-pay | Admitting: Sports Medicine

## 2021-01-08 DIAGNOSIS — Q828 Other specified congenital malformations of skin: Secondary | ICD-10-CM

## 2021-01-08 DIAGNOSIS — M79671 Pain in right foot: Secondary | ICD-10-CM

## 2021-01-08 DIAGNOSIS — M79672 Pain in left foot: Secondary | ICD-10-CM

## 2021-01-08 DIAGNOSIS — E119 Type 2 diabetes mellitus without complications: Secondary | ICD-10-CM

## 2021-01-08 NOTE — Progress Notes (Signed)
Subjective: Erika Freeman is a 60 y.o. female patient who returns to office for callus care.  Patient reports that since last visit the calluses have been doing much better with very minimal pain or buildup.  Patient denies any other pedal complaints at this time.  Fasting blood sugar not recorded   Patient Active Problem List   Diagnosis Date Noted   Gastroesophageal reflux disease without esophagitis 02/11/2020   History of renal transplant 02/11/2020   Hyperlipidemia 02/11/2020   Immunodeficiency ([place]) 02/11/2020   Abdominal pain 07/17/2019   Diabetes ([place]) 06/20/2019   Hyperparathyroidism, primary ([place]) 03/24/2019   Hypokalemia 05/10/2017   Dysuria 04/12/2017   Renal transplant rejection 02/24/2017   Malignant neoplasm of female breast ([place]) 05/16/2016   Hypomagnesemia 02/27/2014   Hypophosphatemia 02/20/2014   Immunosuppressive management encounter following kidney transplant 02/20/2014   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 02/20/2014   Immunosuppression ([place]) 02/16/2014   Essential hypertension 07/18/2012   H/O cesarean section 07/18/2012   Encounter for monitoring tacrolimus therapy 07/18/2012   End-stage renal disease on peritoneal dialysis ([place]) 27/07/2374   Umbilical hernia s/p primary repair 01/10/2012 01/24/2012   Constipation, chronic 01/24/2012   FSGS (focal segmental glomerulosclerosis) 11/23/2011   Uterine fibromyoma 11/23/2011   Chronic kidney disease, stage V (very severe) ([place])     Current Outpatient Medications on File Prior to Visit  Medication Sig Dispense Refill   amLODipine (NORVASC) 10 MG tablet Take 10 mg by mouth daily.      anastrozole (ARIMIDEX) 1 MG tablet TAKE 1 TABLET(1 MG) BY MOUTH DAILY 90 tablet 4   aspirin 81 MG tablet Take 81 mg by mouth every morning.      atorvastatin (LIPITOR) 10 MG tablet Take 10 mg by mouth 3 (three) times a week.     cholecalciferol (VITAMIN D3) 25 MCG (1000 UNIT) tablet Take 1,000 Units by mouth daily.      cinacalcet (SENSIPAR) 30 MG tablet 1 tablet with food or after a meal     glucose blood (ACCU-CHEK AVIVA PLUS) test strip Use to check blood sugar once a day. 100 each 12   Magnesium Oxide (MAG-OXIDE) 200 MG TABS See admin instructions.     potassium chloride SA (KLOR-CON) 20 MEQ tablet Take 40 mEq by mouth 2 (two) times daily.     predniSONE (DELTASONE) 5 MG tablet Take 5 mg by mouth every morning.      promethazine (PHENERGAN) 12.5 MG tablet Take 12.5 mg by mouth every 6 (six) hours as needed.     RYBELSUS 3 MG TABS TAKE 1 TABLET BY MOUTH DAILY 30 tablet 3   tacrolimus (PROGRAF) 1 MG capsule Take by mouth 2 (two) times daily. Takes 7mg  in the AM and 6mg  in the PM.     No current facility-administered medications on file prior to visit.    No Known Allergies  Objective:  General: Alert and oriented x3 in no acute distress  Dermatology: Keratotic lesion present submet 3/4 bilateral with skin lines transversing the lesion right greater than left, pain is present with direct pressure to the lesion with a central nucleated core noted, no webspace macerations, no ecchymosis bilateral, all nails x 10 are well manicured.  Vascular: Dorsalis Pedis and Posterior Tibial pedal pulses 1/4, Capillary Fill Time 3 seconds, + pedal hair growth bilateral, no edema bilateral lower extremities, Temperature gradient within normal limits.  Neurology: Johney Maine sensation intact via light touch bilateral.  Musculoskeletal: Mild tenderness with palpation at the keratotic lesion site  on Right>Left, hammertoe, pes planus noted, muscular strength 5/5 in all groups without pain or limitation on range of motion.  Assessment and Plan: Problem List Items Addressed This Visit   None Visit Diagnoses     Porokeratosis    -  Primary   Diabetes mellitus without complication ([place])       Foot pain, bilateral            -Complete examination performed -Re-Discussed treatment options for keratosis in the setting of  diabetes -Parred keratoic lesion using a chisel x2 treated the area withSalinocaine covered with Band-Aid -At no charge smooth nails using a rotary bur -Continue with daily skin emollients; recommend foot miracle cream like before -Return in 10-12 weeks for diabetic callus trim like before. Landis Martins, DPM

## 2021-01-18 ENCOUNTER — Other Ambulatory Visit: Payer: Self-pay

## 2021-01-18 DIAGNOSIS — I8390 Asymptomatic varicose veins of unspecified lower extremity: Secondary | ICD-10-CM

## 2021-02-18 ENCOUNTER — Ambulatory Visit (HOSPITAL_COMMUNITY): Payer: Medicare Other

## 2021-02-22 ENCOUNTER — Other Ambulatory Visit: Payer: Self-pay | Admitting: Endocrinology

## 2021-02-24 DIAGNOSIS — Z7984 Long term (current) use of oral hypoglycemic drugs: Secondary | ICD-10-CM | POA: Diagnosis not present

## 2021-02-24 DIAGNOSIS — Z7960 Long term (current) use of unspecified immunomodulators and immunosuppressants: Secondary | ICD-10-CM | POA: Diagnosis not present

## 2021-02-24 DIAGNOSIS — Z7952 Long term (current) use of systemic steroids: Secondary | ICD-10-CM | POA: Diagnosis not present

## 2021-02-24 DIAGNOSIS — I10 Essential (primary) hypertension: Secondary | ICD-10-CM | POA: Diagnosis not present

## 2021-02-24 DIAGNOSIS — Z9221 Personal history of antineoplastic chemotherapy: Secondary | ICD-10-CM | POA: Diagnosis not present

## 2021-02-24 DIAGNOSIS — Z792 Long term (current) use of antibiotics: Secondary | ICD-10-CM | POA: Diagnosis not present

## 2021-02-24 DIAGNOSIS — Z4822 Encounter for aftercare following kidney transplant: Secondary | ICD-10-CM | POA: Diagnosis not present

## 2021-02-24 DIAGNOSIS — E892 Postprocedural hypoparathyroidism: Secondary | ICD-10-CM | POA: Diagnosis not present

## 2021-02-24 DIAGNOSIS — E876 Hypokalemia: Secondary | ICD-10-CM | POA: Diagnosis not present

## 2021-02-24 DIAGNOSIS — Z923 Personal history of irradiation: Secondary | ICD-10-CM | POA: Diagnosis not present

## 2021-02-24 DIAGNOSIS — E139 Other specified diabetes mellitus without complications: Secondary | ICD-10-CM | POA: Diagnosis not present

## 2021-02-24 DIAGNOSIS — Z940 Kidney transplant status: Secondary | ICD-10-CM | POA: Diagnosis not present

## 2021-02-24 DIAGNOSIS — Z79899 Other long term (current) drug therapy: Secondary | ICD-10-CM | POA: Diagnosis not present

## 2021-02-24 DIAGNOSIS — Z853 Personal history of malignant neoplasm of breast: Secondary | ICD-10-CM | POA: Diagnosis not present

## 2021-02-24 DIAGNOSIS — D849 Immunodeficiency, unspecified: Secondary | ICD-10-CM | POA: Diagnosis not present

## 2021-03-25 ENCOUNTER — Ambulatory Visit: Payer: Medicare Other | Admitting: Physician Assistant

## 2021-03-25 ENCOUNTER — Other Ambulatory Visit: Payer: Self-pay

## 2021-03-25 ENCOUNTER — Ambulatory Visit (HOSPITAL_COMMUNITY)
Admission: RE | Admit: 2021-03-25 | Discharge: 2021-03-25 | Disposition: A | Discharge status: Home / Self Care | Payer: Medicare Other | Source: Ambulatory Visit | Attending: Vascular Surgery | Admitting: Vascular Surgery

## 2021-03-25 VITALS — BP 122/81 | HR 85 | Temp 97.9°F | Resp 20 | Ht 65.0 in | Wt 155.0 lb

## 2021-03-25 DIAGNOSIS — I8391 Asymptomatic varicose veins of right lower extremity: Secondary | ICD-10-CM | POA: Diagnosis not present

## 2021-03-25 DIAGNOSIS — I8390 Asymptomatic varicose veins of unspecified lower extremity: Secondary | ICD-10-CM | POA: Diagnosis not present

## 2021-03-25 DIAGNOSIS — M25561 Pain in right knee: Secondary | ICD-10-CM

## 2021-03-25 NOTE — Progress Notes (Signed)
[facility] [place]   Reason for referral: right leg pain at the knee  History of Present Illness  Erika Freeman is a 61 y.o. female who presents with chief complaint: swollen leg.  Patient notes, onset of on/off for several months with activity months ago, associated with walking > 2 miles.  The patient has had no history of DVT, no history of varicose vein, no history of venous stasis ulcers, no history of  Lymphedema and spider veins causing skin changes in lower legs.  There is no family history of venous disorders.  The patient has not used compression stockings in the past.  She denies claudication, rest pain or non healing wounds.    She denies bleeding episodes surrounding a small patch of spider veins right lateral knee. Past medical history HTN, hyperlipidemia, DM, and CKD.  Past Medical History:  Diagnosis Date   Anemia    CKD (chronic kidney disease), stage III ([place])    nephologist-- dr Wilhemina [facility])  and transplant clinic [facility]   Diabetes mellitus without complication ([place])    Diverticulosis of colon    GERD (gastroesophageal reflux disease)    03-21-2019 -- per pt w/ spicy foods and at night time occasionally,  chews watermelon gum and pt states it resolves   Heart murmur    03-21-2019  per pt was told had murmur some years ago, pt is asymptomatic (last echo in [facility] 05-31-2016  trivial regurg MR and TR)   History of end stage renal disease    secondary to FSGS with PD until kidney transplant 12/ 2015   History of radiation therapy 12/20/16  to 01/17/17   left breast 50.4 gy in 28 fractions   Hypercalcemia    pt had recent hospital admision @[facility]  (in care everywhere) for acute kidney injury due to hypercalcemia and hypomagnesium,  resolved with medication, lov w/ transplant clinic @[facility]  02-14-2019  calcium  normal per lab (care everywhere)    Hypertension    followed by transplant clinic @[facility] ---  (pt had stress echo  07-11-2013, negative for ischemia, in care everywhere)   Hypokalemia    pt hospital admission    Hypomagnesemia    Immunosuppression ([place])    due to kidney transplant 02-12-2014   Kidney transplant status, living unrelated donor followed by transplant clinic @[facility] -- Dr Delane Ginger. [id number]   02-12-2014  @[facility]    Malignant neoplasm of breast (female) [facility]) oncologist--- dr Jana Hakim (lov in epic no recurrence 01-18-2019)   dx 03/ 2018  IDC Stage IIB ---  completed chemotherapy 10-11-2016;  s/p left breast lumpectomy with node dissection 11-14-2016;  completed radiation 01-17-2017   Personal history of chemotherapy    left breast cancer--  06-01-2016  to 10-11-2016   Personal history of radiation therapy    Primary hyperparathyroidism [facility])    12/ 2020 w/ hypercalcimia  (pt has hx of hyperparathyoidism s/p right inferior parathyroidectomy 11-27-2006 @[facility] ;  per path adenoma)   Uterine fibroid     Past Surgical History:  Procedure Laterality Date   BREAST BIOPSY     BREAST LUMPECTOMY Left    2018   BREAST LUMPECTOMY WITH RADIOACTIVE SEED AND SENTINEL LYMPH NODE BIOPSY Left 11/14/2016   Procedure: LEFT BREAST RADIOACTIVE SEED BRACKETED LUMPECTOMY, LEFT AXILLARY SENTINEL LYMPH NODE BIOPSY WITH BLUE DYE INJECTION;  Surgeon: [name], MD;  Location: [place];  Service: General;  Laterality: Left;   BREAST SURGERY  2012   left - fibroadenoma   CAPD  INSERTION  01/10/2012   Procedure: LAPAROSCOPIC INSERTION CONTINUOUS AMBULATORY PERITONEAL DIALYSIS  (CAPD) CATHETER;  Surgeon: Adin Hector, MD;  Location: [place];  Service: General;  Laterality: N/A;  LAPAROSCOPIC CAPD CATHETER PLACEMENT OMENTOPEXY   [place] Right 11-27-2006   dr gerkin @[facility]    right inferior  (adenoma)   PARATHYROIDECTOMY Right 03/25/2019   Procedure: RIGHT SUPERIOR PARATHYROIDECTOMY;  Surgeon: Armandina Gemma, MD;   Location: [place];  Service: General;  Laterality: Right;   PORT-A-CATH REMOVAL Right 11/14/2016   Procedure: REMOVAL PORT-A-CATH;  Surgeon: [name], MD;  Location: [place];  Service: General;  Laterality: Right;   PORTACATH PLACEMENT Right 05/24/2016   Procedure: INSERTION PORT-A-CATH WITH [place];  Surgeon: [name], MD;  Location: [place];  Service: General;  Laterality: Right;   RENAL BIOPSY  2011   TUBAL LIGATION     UMBILICAL HERNIA REPAIR  01/10/2012   Procedure: HERNIA REPAIR UMBILICAL ADULT;  Surgeon: Adin Hector, MD;  Location: [place];  Service: General;  Laterality: N/A;    Social History   Socioeconomic History   Marital status: Married    Spouse name: Not on file   Number of children: 3   Years of education: Not on file   Highest education level: Not on file  Occupational History   Not on file  Tobacco Use   Smoking status: Never   Smokeless tobacco: Never  Vaping Use   Vaping Use: Never used  Substance and Sexual Activity   Alcohol use: No   Drug use: Never   Sexual activity: Not on file  Other Topics Concern   Not on file  Social History Narrative   Not on file   Social Determinants of Health   Financial Resource Strain: Not on file  Food Insecurity: Not on file  Transportation Needs: Not on file  Physical Activity: Not on file  Stress: Not on file  Social Connections: Not on file  Intimate Partner Violence: Not on file    Family History  Problem Relation Age of Onset   Cancer Maternal Uncle        lung   Cancer Cousin        colon   Diabetes Maternal Grandmother    Colon cancer Neg Hx    Colon polyps Neg Hx    Esophageal cancer Neg Hx    Stomach cancer Neg Hx    Rectal cancer Neg Hx     Current Outpatient Medications on File Prior to Visit  Medication Sig Dispense Refill   amLODipine (NORVASC) 10 MG tablet Take 10 mg by mouth daily.      anastrozole (ARIMIDEX) 1 MG tablet TAKE 1 TABLET(1 MG) BY MOUTH DAILY  90 tablet 4   aspirin 81 MG tablet Take 81 mg by mouth every morning.      atorvastatin (LIPITOR) 10 MG tablet Take 10 mg by mouth 3 (three) times a week.     cholecalciferol (VITAMIN D3) 25 MCG (1000 UNIT) tablet Take 1,000 Units by mouth daily.     glucose blood (ACCU-CHEK AVIVA PLUS) test strip Use to check blood sugar once a day. 100 each 12   Magnesium Oxide (MAG-OXIDE) 200 MG TABS See admin instructions.     potassium chloride SA (KLOR-CON) 20 MEQ tablet Take 40 mEq by mouth 2 (two) times daily.  predniSONE (DELTASONE) 5 MG tablet Take 5 mg by mouth every morning.      RYBELSUS 3 MG TABS TAKE 1 TABLET BY MOUTH DAILY 30 tablet 3   tacrolimus (PROGRAF) 1 MG capsule Take by mouth 2 (two) times daily. Takes 7mg  in the AM and 6mg  in the PM.     No current facility-administered medications on file prior to visit.    Allergies as of 03/25/2021   (No Known Allergies)     ROS:   General:  No weight loss, Fever, chills  HEENT: No recent headaches, no nasal bleeding, no visual changes, no sore throat  Neurologic: No dizziness, blackouts, seizures. No recent symptoms of stroke or mini- stroke. No recent episodes of slurred speech, or temporary blindness.  Cardiac: No recent episodes of chest pain/pressure, no shortness of breath at rest.  No shortness of breath with exertion.  Denies history of atrial fibrillation or irregular heartbeat  Vascular: No history of rest pain in feet.  No history of claudication.  No history of non-healing ulcer, No history of DVT   Pulmonary: No home oxygen, no productive cough, no hemoptysis,  No asthma or wheezing  Musculoskeletal:  [ ]  Arthritis, [ ]  Low back pain,  [ ]  Joint pain  Hematologic:No history of hypercoagulable state.  No history of easy bleeding.  No history of anemia  Gastrointestinal: No hematochezia or melena,  No gastroesophageal reflux, no trouble swallowing  Urinary: [ x] chronic Kidney disease, [ ]  on HD - [ ]  MWF or [ ]  TTHS, [  ] Burning with urination, [ ]  Frequent urination, [ ]  Difficulty urinating;   Skin: No rashes  Psychological: No history of anxiety,  No history of depression  Physical Examination  Vitals:   03/25/21 1010  BP: 122/81  Pulse: 85  Resp: 20  Temp: 97.9 F (36.6 C)  SpO2: 99%  Weight: 155 lb (70.3 kg)  Height: 5\' 5"  (1.651 m)    Body mass index is 25.79 kg/m.  General:  Alert and oriented, no acute distress HEENT: Normal Neck: No bruit or JVD Pulmonary: Clear to auscultation bilaterally Cardiac: Regular Rate and Rhythm without murmur Abdomen: Soft, non-tender, non-distended, no mass, no scars Skin: No rash Extremity Pulses:  2+ radial, brachial, femoral, dorsalis pedis, posterior tibial pulses bilaterally Musculoskeletal: No deformity or edema  Neurologic: Upper and lower extremity motor 5/5 and symmetric  DATA: Venous Reflux Times  +--------------+---------+------+-----------+------------+--------+   RIGHT          Reflux No Reflux Reflux Time Diameter cms Comments                              Yes                                       +--------------+---------+------+-----------+------------+--------+   CFV            no                                                   +--------------+---------+------+-----------+------------+--------+   FV mid         no                                                   +--------------+---------+------+-----------+------------+--------+  Popliteal      no                                                   +--------------+---------+------+-----------+------------+--------+   GSV at [facility]     no                               0.53                +--------------+---------+------+-----------+------------+--------+   GSV prox thigh no                               0.35                +--------------+---------+------+-----------+------------+--------+   GSV mid thigh  no                               0.35                 +--------------+---------+------+-----------+------------+--------+   GSV dist thigh no                               0.32                +--------------+---------+------+-----------+------------+--------+   GSV at knee    no                               0.48                +--------------+---------+------+-----------+------------+--------+   GSV prox calf  no                               0.50                +--------------+---------+------+-----------+------------+--------+   SSV Pop Fossa  no                               0.23                +--------------+---------+------+-----------+------------+--------+   SSV prox calf  no                               0.23                +--------------+---------+------+-----------+------------+--------+   SSV mid calf   no                               0.30                +--------------+---------+------+-----------+------------+--------+     Summary:  Right:  - No evidence of deep vein thrombosis seen in the right lower extremity,  from the common femoral through the popliteal veins.  - No evidence of superficial venous thrombosis in the right lower  extremity.  - There is no evidence of  venous reflux seen in the right lower extremity.     Assessment/Plan: Right Knee pain She does not have venous reflux based on the exam and the venous duplex.  She has a small patch of spider veins that has been benign.  She has no edema, skin changes or non healing ulcers.  She has palpable pedal pulses B LE.   She is not at risk of limb loss.  She may benefit from orthopedic knee work up if she continue to have pain or if increases and interferes with her ADLs.  She will f/u PRN.    Roxy Horseman PA-C [facility] [place] Office: [phone]  MD on call Trula Slade

## 2021-04-09 ENCOUNTER — Ambulatory Visit: Payer: Medicare Other | Admitting: Sports Medicine

## 2021-04-09 ENCOUNTER — Encounter: Payer: Self-pay | Admitting: Sports Medicine

## 2021-04-09 DIAGNOSIS — Q828 Other specified congenital malformations of skin: Secondary | ICD-10-CM | POA: Diagnosis not present

## 2021-04-09 DIAGNOSIS — E119 Type 2 diabetes mellitus without complications: Secondary | ICD-10-CM

## 2021-04-09 NOTE — Progress Notes (Signed)
Subjective: Erika Freeman is a 61 y.o. female patient who returns to office for callus care.  Patient reports that since last visit the calluses have been doing good but getting sore over the last few days on the right. Fasting blood sugar 101 yesterday and last visit to PCP [place] 1 week ago.  Patient Active Problem List   Diagnosis Date Noted   Gastroesophageal reflux disease without esophagitis 02/11/2020   History of renal transplant 02/11/2020   Hyperlipidemia 02/11/2020   Immunodeficiency ([place]) 02/11/2020   Abdominal pain 07/17/2019   Diabetes ([place]) 06/20/2019   Hyperparathyroidism, primary ([place]) 03/24/2019   Hypokalemia 05/10/2017   Dysuria 04/12/2017   Renal transplant rejection 02/24/2017   Malignant neoplasm of female breast ([place]) 05/16/2016   PTDM (post-transplant diabetes mellitus) ([place]) 06/26/2014   Hypomagnesemia 02/27/2014   Hypophosphatemia 02/20/2014   Immunosuppressive management encounter following kidney transplant 02/20/2014   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 02/20/2014   Immunosuppression ([place]) 02/16/2014   Essential hypertension 07/18/2012   H/O cesarean section 07/18/2012   Encounter for monitoring tacrolimus therapy 07/18/2012   End-stage renal disease on peritoneal dialysis ([place]) 53/61/4431   Umbilical hernia s/p primary repair 01/10/2012 01/24/2012   Constipation, chronic 01/24/2012   FSGS (focal segmental glomerulosclerosis) 11/23/2011   Uterine fibromyoma 11/23/2011   Chronic kidney disease, stage V (very severe) ([place])     Current Outpatient Medications on File Prior to Visit  Medication Sig Dispense Refill   promethazine (PHENERGAN) 12.5 MG tablet 1 tablet as needed     amLODipine (NORVASC) 10 MG tablet 1 tablet     anastrozole (ARIMIDEX) 1 MG tablet TAKE 1 TABLET(1 MG) BY MOUTH DAILY 90 tablet 4   aspirin 81 MG tablet Take 81 mg by mouth every morning.      atorvastatin (LIPITOR) 10 MG tablet 1 tablet     cholecalciferol  (VITAMIN D3) 25 MCG (1000 UNIT) tablet Take 1,000 Units by mouth daily.     Cholecalciferol 25 MCG (1000 UT) capsule Take by mouth.     cinacalcet (SENSIPAR) 30 MG tablet 1 tablet with food or after a meal     glucose blood (ACCU-CHEK AVIVA PLUS) test strip Use to check blood sugar once a day. 100 each 12   Magnesium Oxide (MAG-OXIDE) 200 MG TABS See admin instructions.     potassium chloride SA (KLOR-CON) 20 MEQ tablet Take 40 mEq by mouth 2 (two) times daily.     predniSONE (DELTASONE) 5 MG tablet Take 5 mg by mouth every morning.      RYBELSUS 3 MG TABS TAKE 1 TABLET BY MOUTH DAILY 30 tablet 3   tacrolimus (PROGRAF) 1 MG capsule See admin instructions.     No current facility-administered medications on file prior to visit.    No Known Allergies  Objective:  General: Alert and oriented x3 in no acute distress  Dermatology: Keratotic lesion present submet 3/4 bilateral with skin lines transversing the lesion right greater than left, pain is present with direct pressure to the lesion with a central nucleated core noted, no webspace macerations, no ecchymosis bilateral, all nails x 10 are well manicured.  Vascular: Dorsalis Pedis and Posterior Tibial pedal pulses 1/4, Capillary Fill Time 3 seconds, + pedal hair growth bilateral, no edema bilateral lower extremities, Temperature gradient within normal limits.  Neurology: Gross sensation intact via light touch bilateral.  Musculoskeletal: Mild tenderness with palpation at the keratotic lesion site on Right>Left, hammertoe, pes planus noted, muscular strength 5/5 in all  groups without pain or limitation on range of motion.  Assessment and Plan: Problem List Items Addressed This Visit   None Visit Diagnoses     Porokeratosis    -  Primary   Diabetes mellitus without complication ([place])       Relevant Medications   atorvastatin (LIPITOR) 10 MG tablet        -Complete examination performed -Re-Discussed treatment options for  keratosis in the setting of diabetes -Parred keratoic lesion using a chisel x2 treated the area withSalinocaine covered with Band-Aid -Continue with daily skin emollients; recommend foot miracle cream like before and good supportive shoes and dispensed offloading padding to use as needed -Return in 10-12 weeks for diabetic callus trim like before. Landis Martins, DPM

## 2021-06-10 DIAGNOSIS — Z940 Kidney transplant status: Secondary | ICD-10-CM | POA: Diagnosis not present

## 2021-06-10 DIAGNOSIS — N390 Urinary tract infection, site not specified: Secondary | ICD-10-CM | POA: Diagnosis not present

## 2021-06-18 ENCOUNTER — Ambulatory Visit: Payer: Medicare Other | Admitting: Endocrinology

## 2021-06-18 DIAGNOSIS — E1122 Type 2 diabetes mellitus with diabetic chronic kidney disease: Secondary | ICD-10-CM | POA: Diagnosis not present

## 2021-06-18 DIAGNOSIS — N1831 Chronic kidney disease, stage 3a: Secondary | ICD-10-CM

## 2021-06-18 LAB — POCT GLYCOSYLATED HEMOGLOBIN (HGB A1C): Hemoglobin A1C: 5.7 % — AB (ref 4.0–5.6)

## 2021-06-18 MED ORDER — RYBELSUS 3 MG PO TABS: 1.0000 | ORAL_TABLET | Freq: Every day | ORAL | 1 refills | Status: DC

## 2021-06-18 NOTE — Progress Notes (Signed)
? ?Subjective:  ? ? Patient ID: Erika Freeman, female    DOB: [birth year], 61 y.o.   MRN: [id number] ? ?HPI ?Pt returns for f/u of diabetes mellitus:  ?DM type: 2 ?Dx'ed: 2020 ?Complications: CRI (transplant 2015).    ?Therapy: Rybelsus ?GDM: never ?DKA: never ?Severe hypoglycemia: never.   ?Pancreatitis: never.   ?Pancreatic imaging: never.   ?SDOH: none.   ?Other: she has never been on insulin; CRI limits rx options; fructosamine converts to A1c slightly higher than A1c itself.   ?Interval history: pt says cbg's are well-controlled.  She takes meds as rx'ed.  pt states she feels well in general.   ?Past Medical History:  ?Diagnosis Date  ? Anemia   ? CKD (chronic kidney disease), stage III ([place])   ? nephologist-- dr Ria Comment [facility])  and transplant clinic [facility]  ? Diabetes mellitus without complication ([place])   ? Diverticulosis of colon   ? GERD (gastroesophageal reflux disease)   ? 03-21-2019 -- per pt w/ spicy foods and at night time occasionally,  chews watermelon gum and pt states it resolves  ? Heart murmur   ? 03-21-2019  per pt was told had murmur some years ago, pt is asymptomatic (last echo in [facility] 05-31-2016  trivial regurg MR and TR)  ? History of end stage renal disease   ? secondary to FSGS with PD until kidney transplant 12/ 2015  ? History of radiation therapy 12/20/16  to 01/17/17  ? left breast 50.4 gy in 28 fractions  ? Hypercalcemia   ? pt had recent hospital admision @[facility]  (in care everywhere) for acute kidney injury due to hypercalcemia and hypomagnesium,  resolved with medication, lov w/ transplant clinic @[facility]  02-14-2019  calcium  normal per lab (care everywhere)   ? Hypertension   ? followed by transplant clinic @[facility] ---  (pt had stress echo 07-11-2013, negative for ischemia, in care everywhere)  ? Hypokalemia   ? pt hospital admission   ? Hypomagnesemia   ? Immunosuppression ([place])   ? due to kidney transplant 02-12-2014  ? Kidney transplant status, living unrelated  donor followed by transplant clinic [facility] -- Dr Delane Ginger. [id number]  ? 02-12-2014  @[facility]   ? Malignant neoplasm of breast (female) [facility]) oncologist--- dr Jana Hakim (lov in epic no recurrence 01-18-2019)  ? dx 03/ 2018  IDC Stage IIB ---  completed chemotherapy 10-11-2016;  s/p left breast lumpectomy with node dissection 11-14-2016;  completed radiation 01-17-2017  ? Personal history of chemotherapy   ? left breast cancer--  06-01-2016  to 10-11-2016  ? Personal history of radiation therapy   ? Primary hyperparathyroidism ([place])   ? 12/ 2020 w/ hypercalcimia  (pt has hx of hyperparathyoidism s/p right inferior parathyroidectomy 11-27-2006 @[facility] ;  per path adenoma)  ? Uterine fibroid   ? ? ?Past Surgical History:  ?Procedure Laterality Date  ? BREAST BIOPSY    ? BREAST LUMPECTOMY Left   ? 2018  ? BREAST LUMPECTOMY WITH RADIOACTIVE SEED AND SENTINEL LYMPH NODE BIOPSY Left 11/14/2016  ? Procedure: LEFT BREAST RADIOACTIVE SEED BRACKETED LUMPECTOMY, LEFT AXILLARY SENTINEL LYMPH NODE BIOPSY WITH BLUE DYE INJECTION;  Surgeon: [name], MD;  Location: [place];  Service: General;  Laterality: Left;  ? BREAST SURGERY  2012  ? left - fibroadenoma  ? CAPD INSERTION  01/10/2012  ? Procedure: LAPAROSCOPIC INSERTION CONTINUOUS AMBULATORY PERITONEAL DIALYSIS  (CAPD) CATHETER;  Surgeon: Adin Hector, MD;  Location: [place];  Service: General;  Laterality: N/A;  LAPAROSCOPIC CAPD  CATHETER PLACEMENT OMENTOPEXY  ? [place]  ? COLONOSCOPY    ? [place] OF UTERUS  2000  ? KIDNEY TRANSPLANT    ? PARATHYROIDECTOMY Right 11-27-2006   dr gerkin @[facility]   ? right inferior  (adenoma)  ? PARATHYROIDECTOMY Right 03/25/2019  ? Procedure: RIGHT SUPERIOR PARATHYROIDECTOMY;  Surgeon: Armandina Gemma, MD;  Location: [facility];  Service: General;  Laterality: Right;  ? PORT-A-CATH REMOVAL Right 11/14/2016  ? Procedure: REMOVAL PORT-A-CATH;  Surgeon: [name], MD;  Location: [place];  Service: General;   Laterality: Right;  ? PORTACATH PLACEMENT Right 05/24/2016  ? Procedure: INSERTION PORT-A-CATH WITH [place];  Surgeon: [name], MD;  Location: [place];  Service: General;  Laterality: Right;  ? RENAL BIOPSY  2011  ? TUBAL LIGATION    ? UMBILICAL HERNIA REPAIR  01/10/2012  ? Procedure: HERNIA REPAIR UMBILICAL ADULT;  Surgeon: Adin Hector, MD;  Location: [place];  Service: General;  Laterality: N/A;  ? ? ?Social History  ? ?Socioeconomic History  ? Marital status: Married  ?  Spouse name: Not on file  ? Number of children: 3  ? Years of education: Not on file  ? Highest education level: Not on file  ?Occupational History  ? Not on file  ?Tobacco Use  ? Smoking status: Never  ? Smokeless tobacco: Never  ?Vaping Use  ? Vaping Use: Never used  ?Substance and Sexual Activity  ? Alcohol use: No  ? Drug use: Never  ? Sexual activity: Not on file  ?Other Topics Concern  ? Not on file  ?Social History Narrative  ? Not on file  ? ?Social Determinants of Health  ? ?Financial Resource Strain: Not on file  ?Food Insecurity: Not on file  ?Transportation Needs: Not on file  ?Physical Activity: Not on file  ?Stress: Not on file  ?Social Connections: Not on file  ?Intimate Partner Violence: Not on file  ? ? ?Current Outpatient Medications on File Prior to Visit  ?Medication Sig Dispense Refill  ? amLODipine (NORVASC) 10 MG tablet 1 tablet    ? anastrozole (ARIMIDEX) 1 MG tablet TAKE 1 TABLET(1 MG) BY MOUTH DAILY 90 tablet 4  ? aspirin 81 MG tablet Take 81 mg by mouth every morning.     ? atorvastatin (LIPITOR) 10 MG tablet 1 tablet    ? cholecalciferol (VITAMIN D3) 25 MCG (1000 UNIT) tablet Take 1,000 Units by mouth daily.    ? Cholecalciferol 25 MCG (1000 UT) capsule Take by mouth.    ? cinacalcet (SENSIPAR) 30 MG tablet 1 tablet with food or after a meal    ? glucose blood (ACCU-CHEK AVIVA PLUS) test strip Use to check blood sugar once a day. 100 each 12  ? Magnesium Oxide (MAG-OXIDE) 200 MG TABS See admin instructions.    ?  potassium chloride SA (KLOR-CON) 20 MEQ tablet Take 40 mEq by mouth 2 (two) times daily.    ? predniSONE (DELTASONE) 5 MG tablet Take 5 mg by mouth every morning.     ? promethazine (PHENERGAN) 12.5 MG tablet 1 tablet as needed    ? tacrolimus (PROGRAF) 1 MG capsule See admin instructions.    ? ?No current facility-administered medications on file prior to visit.  ? ? ?No Known Allergies ? ?Family History  ?Problem Relation Age of Onset  ? Cancer Maternal Uncle   ?     lung  ? Cancer Cousin   ?     colon  ?  Diabetes Maternal Grandmother   ? Colon cancer Neg Hx   ? Colon polyps Neg Hx   ? Esophageal cancer Neg Hx   ? Stomach cancer Neg Hx   ? Rectal cancer Neg Hx   ? ? ?LMP 04/27/2016 (Exact Date)  ? ? ?Review of Systems ?Denies N/HB.   ?   ?Objective:  ? Physical Exam ?GENERAL: no distress.   ? ? ?Lab Results  ?Component Value Date  ? HGBA1C 5.7 (A) 06/18/2021  ? ?   ?Assessment & Plan:  ?Type 2 DM: well-controlled ? ?Patient Instructions  ?check your blood sugar once a day.  vary the time of day when you check, between before the 3 meals, and at bedtime.  also check if you have symptoms of your blood sugar being too high or too low.  please keep a record of the readings and bring it to your next appointment here (or you can bring the meter itself).  You can write it on any piece of paper.  please call us sooner if your blood sugar goes below 70, or if you have a lot of readings over 200.   ?Please continue the same Rybelsus.   ?Please come back for a follow-up appointment in 6 months.    ? ? ?

## 2021-06-18 NOTE — Patient Instructions (Addendum)
check your blood sugar once a day.  vary the time of day when you check, between before the 3 meals, and at bedtime.  also check if you have symptoms of your blood sugar being too high or too low.  please keep a record of the readings and bring it to your next appointment here (or you can bring the meter itself).  You can write it on any piece of paper.  please call us sooner if your blood sugar goes below 70, or if you have a lot of readings over 200.   ?Please continue the same Rybelsus.   ?Please come back for a follow-up appointment in 6 months.    ?

## 2021-07-07 ENCOUNTER — Encounter: Payer: Self-pay | Admitting: Sports Medicine

## 2021-07-07 ENCOUNTER — Ambulatory Visit: Payer: Medicare Other | Admitting: Sports Medicine

## 2021-07-07 DIAGNOSIS — E119 Type 2 diabetes mellitus without complications: Secondary | ICD-10-CM | POA: Diagnosis not present

## 2021-07-07 DIAGNOSIS — M79671 Pain in right foot: Secondary | ICD-10-CM

## 2021-07-07 DIAGNOSIS — M79672 Pain in left foot: Secondary | ICD-10-CM

## 2021-07-07 DIAGNOSIS — Q828 Other specified congenital malformations of skin: Secondary | ICD-10-CM | POA: Diagnosis not present

## 2021-07-07 NOTE — Progress Notes (Signed)
Subjective: [name] is a 61 y.o. female patient who returns to office for callus care.  Patient reports that since last visit the calluses have been doing good but getting sore over the last few days on the right and states that she also has a little bit of soreness on her left big toe thinks that she may have trimmed her nail too close. ?Fasting blood sugar today not recorded and last visit to PCP Erika Freeman last visit not recorded. ?Patient Active Problem List  ? Diagnosis Date Noted  ? Gastroesophageal reflux disease without esophagitis 02/11/2020  ? History of renal transplant 02/11/2020  ? Hyperlipidemia 02/11/2020  ? Immunodeficiency ([place]) 02/11/2020  ? Abdominal pain 07/17/2019  ? Diabetes ([place]) 06/20/2019  ? Hyperparathyroidism, primary ([place]) 03/24/2019  ? Hypokalemia 05/10/2017  ? Dysuria 04/12/2017  ? Renal transplant rejection 02/24/2017  ? Malignant neoplasm of female breast ([place]) 05/16/2016  ? PTDM (post-transplant diabetes mellitus) ([place]) 06/26/2014  ? Hypomagnesemia 02/27/2014  ? Hypophosphatemia 02/20/2014  ? Immunosuppressive management encounter following kidney transplant 02/20/2014  ? 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 Immunosuppression ([place]) 02/16/2014  ? Essential hypertension 07/18/2012  ? H/O cesarean section 07/18/2012  ? Encounter for monitoring tacrolimus therapy 07/18/2012  ? End-stage renal disease on peritoneal dialysis ([place]) 07/18/2012  ? Umbilical hernia s/p primary repair 01/10/2012 01/24/2012  ? Constipation, chronic 01/24/2012  ? FSGS (focal segmental glomerulosclerosis) 11/23/2011  ? Uterine fibromyoma 11/23/2011  ? Chronic kidney disease, stage V (very severe) ([place])   ? ? ?Current Outpatient Medications on File Prior to Visit  ?Medication Sig Dispense Refill  ? amLODipine (NORVASC) 10 MG tablet 1 tablet    ? anastrozole (ARIMIDEX) 1 MG tablet TAKE 1 TABLET(1 MG) BY MOUTH DAILY 90 tablet 4  ? anastrozole (ARIMIDEX) 1 MG tablet     ? aspirin  81 MG tablet Take 81 mg by mouth every morning.     ? atorvastatin (LIPITOR) 10 MG tablet 1 tablet    ? cholecalciferol (VITAMIN D3) 25 MCG (1000 UNIT) tablet Take 1,000 Units by mouth daily.    ? cinacalcet (SENSIPAR) 30 MG tablet 1 tablet with food or after a meal    ? FLOWFLEX COVID-19 AG HOME TEST KIT See admin instructions.    ? glucose blood (ACCU-CHEK AVIVA PLUS) test strip Use to check blood sugar once a day. 100 each 12  ? Magnesium Oxide (MAG-OXIDE) 200 MG TABS See admin instructions.    ? potassium chloride SA (KLOR-CON) 20 MEQ tablet Take 40 mEq by mouth 2 (two) times daily.    ? predniSONE (DELTASONE) 5 MG tablet Take 5 mg by mouth every morning.     ? promethazine (PHENERGAN) 12.5 MG tablet 1 tablet as needed    ? Semaglutide (RYBELSUS) 3 MG TABS Take 1 tablet by mouth daily. 90 tablet 1  ? tacrolimus (PROGRAF) 1 MG capsule See admin instructions.    ? ?No current facility-administered medications on file prior to visit.  ? ? ?No Known Allergies ? ?Objective:  ?General: Alert and oriented x3 in no acute distress ? ?Dermatology: Keratotic lesion present submet 3/4 bilateral with skin lines transversing the lesion right greater than left, pain is present with direct pressure to the lesion with a central nucleated core noted, no webspace macerations, no ecchymosis bilateral, all nails x 10 are well manicured however there is mild incurvation noted at the left hallux lateral border patient likely is getting in very early ingrown nail. ? ?Vascular:  Dorsalis Pedis and Posterior Tibial pedal pulses 1/4, Capillary Fill Time 3 seconds, + pedal hair growth bilateral, no edema bilateral lower extremities, Temperature gradient within normal limits. ? ?Neurology: Gross sensation intact via light touch bilateral. ? ?Musculoskeletal: Mild tenderness with palpation at the keratotic lesion site on Right>Left and to the left hallux lateral margin, hammertoe, pes planus noted, muscular strength 5/5 in all groups without  pain or limitation on range of motion. ? ?Assessment and Plan: ?Problem List Items Addressed This Visit   ?None ?Visit Diagnoses   ? ? Porokeratosis    -  Primary  ? Diabetes mellitus without complication ([place])      ? Foot pain, bilateral      ? ?  ? ? ? ?-Complete examination performed ?-Evaluated left hallux nail and at no additional charge mechanically debrided and removed offending border at the left hallux lateral margin using a sterile nail nipper without incident.  Advised patient to soak as directed with warm water and Epsom salt for the next 3 days and apply a small amount of Neosporin if the area fails to continue to improve patient may need to return to office sooner for reevaluation of her left hallux nail. ?-Re-Discussed treatment options for keratosis in the setting of diabetes ?-Parred keratoic lesion using a chisel x2 treated the area withSalinocaine covered with Band-Aid ?-Continue with daily skin emollients; sample of foot miracle cream provided ?-Recommend good supportive shoes daily for foot type and provided offloading padding to use as directed ?-Return in 10-12 weeks for diabetic callus trim like before. ?Erika Freeman, DPM ?

## 2021-09-08 DIAGNOSIS — Z940 Kidney transplant status: Secondary | ICD-10-CM | POA: Diagnosis not present

## 2021-09-08 DIAGNOSIS — N390 Urinary tract infection, site not specified: Secondary | ICD-10-CM | POA: Diagnosis not present

## 2021-09-15 DIAGNOSIS — E210 Primary hyperparathyroidism: Secondary | ICD-10-CM | POA: Diagnosis not present

## 2021-09-15 DIAGNOSIS — D849 Immunodeficiency, unspecified: Secondary | ICD-10-CM | POA: Diagnosis not present

## 2021-09-15 DIAGNOSIS — G4762 Sleep related leg cramps: Secondary | ICD-10-CM | POA: Diagnosis not present

## 2021-09-15 DIAGNOSIS — K219 Gastro-esophageal reflux disease without esophagitis: Secondary | ICD-10-CM | POA: Diagnosis not present

## 2021-09-15 DIAGNOSIS — Z940 Kidney transplant status: Secondary | ICD-10-CM | POA: Diagnosis not present

## 2021-09-15 DIAGNOSIS — Z0000 Encounter for general adult medical examination without abnormal findings: Secondary | ICD-10-CM | POA: Diagnosis not present

## 2021-09-15 DIAGNOSIS — E119 Type 2 diabetes mellitus without complications: Secondary | ICD-10-CM | POA: Diagnosis not present

## 2021-09-15 DIAGNOSIS — I10 Essential (primary) hypertension: Secondary | ICD-10-CM | POA: Diagnosis not present

## 2021-09-15 DIAGNOSIS — E785 Hyperlipidemia, unspecified: Secondary | ICD-10-CM | POA: Diagnosis not present

## 2021-09-20 ENCOUNTER — Ambulatory Visit: Payer: Self-pay

## 2021-09-20 NOTE — Patient Outreach (Addendum)
  Care Coordination   Initial Visit Note   09/20/2021 Name: GUDELIA EUGENE MRN: [id number] DOB: 08-20-1960  Orion Crook is a 61 y.o. year old female who sees [place], [place], [place] for primary care. I spoke with  Orion Crook by phone today  What matters to the patients health and wellness today?  PCP's recommendation for the best daily multivitamin.  Goals Addressed             This Visit's Progress    Receive PCP recommendation for best daily vitamin       Care Coordination Interventions: Reviewed medications and current supplements.  Reports excellent compliance with current regimen. Expressed interest in starting a daily multivitamin. Reports being reluctant to simply purchase over the counter due current supplements and possible medication reactions. Expressed interest in discussing with her primary care provider regarding best option.          SDOH assessments and interventions completed:   Yes SDOH Interventions Today    Flowsheet Row Most Recent Value  SDOH Interventions   Food Insecurity Interventions Intervention Not Indicated  Transportation Interventions Intervention Not Indicated       Care Coordination Interventions Activated:  Yes Care Coordination Interventions:  Yes, provided  Follow up plan: Follow up call scheduled for October 04, 2021.  Encounter Outcome:  Pt. Visit Completed    [facility] [phone]

## 2021-09-20 NOTE — Patient Instructions (Addendum)
Visit Information  Thank you for allowing the Care Management team to participate in your care. It was great speaking with you today!    Our next appointment is by telephone on October 04, 2021 at 1:30pm. Please feel free to contact me is you require assistance prior to our next outreach. Please call the care guide team at [phone] if you need to cancel or reschedule your appointment.   Goals Addressed             This Visit's Progress    Receive PCP recommendation for best daily vitamin       Care Coordination Interventions: Reviewed medications and current supplements.  Reports excellent compliance with current regimen. Expressed interest in starting a daily multivitamin. Reports being reluctant to simply purchase over the counter due current supplements and possible medication reactions. Expressed interest in discussing with her primary care provider regarding best option.         The patient verbalized understanding of information discussed during the telephonic outreach. Declined need for mailed instructions or educational resources.   A member of the care management team will follow up next month.   [facility] [phone]

## 2021-09-21 ENCOUNTER — Other Ambulatory Visit: Payer: Self-pay | Admitting: Hematology and Oncology

## 2021-09-21 DIAGNOSIS — Z1231 Encounter for screening mammogram for malignant neoplasm of breast: Secondary | ICD-10-CM

## 2021-10-07 ENCOUNTER — Ambulatory Visit: Payer: Medicare Other | Admitting: Podiatry

## 2021-10-07 ENCOUNTER — Encounter: Payer: Self-pay | Admitting: Podiatry

## 2021-10-07 DIAGNOSIS — L84 Corns and callosities: Secondary | ICD-10-CM

## 2021-10-07 DIAGNOSIS — N1831 Chronic kidney disease, stage 3a: Secondary | ICD-10-CM | POA: Diagnosis not present

## 2021-10-07 DIAGNOSIS — E1122 Type 2 diabetes mellitus with diabetic chronic kidney disease: Secondary | ICD-10-CM | POA: Diagnosis not present

## 2021-10-11 ENCOUNTER — Ambulatory Visit: Payer: Self-pay

## 2021-10-12 NOTE — Progress Notes (Signed)
  Subjective:  Patient ID: Erika Freeman, female    DOB: [birth year],  MRN: [id number]  Erika Freeman presents to clinic today for at risk foot care with h/o NIDDM with ESRD on hemodialysis and callus(es) b/l lower extremities and painful thick toenails that are difficult to trim. Painful toenails interfere with ambulation. Aggravating factors include wearing enclosed shoe gear. Pain is relieved with periodic professional debridement. Painful calluses are aggravated when weightbearing with and without shoegear. Pain is relieved with periodic professional debridement.  Patient states blood glucose was 81 mg/dl today.  Last known HgA1c was unknown.    New problem(s): None.   PCP is [place], [place], [place] , and last visit was  September 15, 2021  No Known Allergies  Review of Systems: Negative except as noted in the HPI.  Objective: No changes noted in today's physical examination.  Vascular Examination: CFT <3 seconds b/l. DP/PT pulses faintly palpable b/l. Skin temperature gradient warm to warm b/l. No ischemia or gangrene. No cyanosis or clubbing noted b/l.    Neurological Examination: Sensation grossly intact b/l with 10 gram monofilament. Vibratory sensation intact b/l.   Dermatological Examination: Pedal skin warm and supple b/l. Toenails 1-5 b/l well maintained with adequate length. No erythema, no edema, no drainage, no fluctuance. Hyperkeratotic lesion(s) submet head 3 b/l and submet head 4 b/l.  No erythema, no edema, no drainage, no fluctuance.  Musculoskeletal Examination: Muscle strength 5/5 to b/l LE. Pes planus deformity noted bilateral LE.  Radiographs: None  Last A1c:      Latest Ref Rng & Units 06/18/2021    9:05 AM 12/11/2020    9:09 AM  Hemoglobin A1C  Hemoglobin-A1c 4.0 - 5.6 % 5.7  5.6    Assessment/Plan: 1. Diabetes mellitus without complication ([place])   2. Callus   3. Type 2 diabetes mellitus with stage 3a chronic kidney disease, without long-term current  use of insulin ([place])      -Patient was evaluated and treated. All patient's and/or POA's questions/concerns answered on today's visit. -Continue diabetic foot care principles: inspect feet daily, monitor glucose as recommended by PCP and/or Endocrinologist, and follow prescribed diet per PCP, Endocrinologist and/or dietician. -Patient to continue soft, supportive shoe gear daily. -Callus(es) submet head 3 b/l and submet head 4 b/l pared utilizing sterile scalpel blade without complication or incident. Total number debrided =4. -Patient/POA to call should there be question/concern in the interim.   Return in about 3 months (around 01/07/2022).  Marzetta Board, DPM

## 2021-10-18 NOTE — Patient Outreach (Signed)
  Care Coordination   Follow Up Visit Note   10/18/2021 Name: DANYEL TOBEY MRN: [id number] DOB: 22-Jul-1960  Orion Crook is a 61 y.o. year old female who sees [place], [place], [place] for primary care. I  collaborated with the [facility] clinic team.  What matters to the patients health and wellness today?  Supplements/Vitamins    Goals Addressed             This Visit's Progress    Receive PCP recommendation for best daily vitamin       Care Coordination Interventions: Reviewed medications and current supplements.  Reports excellent compliance with current regimen. Expressed interest in starting a daily multivitamin. Reports being reluctant to simply purchase over the counter due current supplements and possible medication reactions. Expressed interest in discussing with her primary care provider regarding best option. Update 10/08/21: Patient previously requested outreach/recommendations regarding supplements. Pending feedback from the clinic team.         SDOH assessments and interventions completed:  No     Care Coordination Interventions Activated:  Yes  Care Coordination Interventions:  Yes, provided   Follow up plan:  Will follow up within the next week.    Encounter Outcome:  Pt. Visit Completed    [facility] [phone]

## 2021-10-19 DIAGNOSIS — N289 Disorder of kidney and ureter, unspecified: Secondary | ICD-10-CM | POA: Insufficient documentation

## 2021-10-19 DIAGNOSIS — E569 Vitamin deficiency, unspecified: Secondary | ICD-10-CM | POA: Insufficient documentation

## 2021-10-19 DIAGNOSIS — E119 Type 2 diabetes mellitus without complications: Secondary | ICD-10-CM | POA: Insufficient documentation

## 2021-10-27 DIAGNOSIS — D2239 Melanocytic nevi of other parts of face: Secondary | ICD-10-CM | POA: Diagnosis not present

## 2021-10-27 DIAGNOSIS — D225 Melanocytic nevi of trunk: Secondary | ICD-10-CM | POA: Diagnosis not present

## 2021-10-27 DIAGNOSIS — L814 Other melanin hyperpigmentation: Secondary | ICD-10-CM | POA: Diagnosis not present

## 2021-10-27 DIAGNOSIS — L821 Other seborrheic keratosis: Secondary | ICD-10-CM | POA: Diagnosis not present

## 2021-10-29 ENCOUNTER — Ambulatory Visit
Admission: RE | Admit: 2021-10-29 | Discharge: 2021-10-29 | Disposition: A | Discharge status: Home / Self Care | Payer: Medicare Other | Source: Ambulatory Visit | Attending: Hematology and Oncology | Admitting: Hematology and Oncology

## 2021-10-29 DIAGNOSIS — Z1231 Encounter for screening mammogram for malignant neoplasm of breast: Secondary | ICD-10-CM | POA: Diagnosis not present

## 2021-11-09 ENCOUNTER — Other Ambulatory Visit: Payer: Self-pay | Admitting: *Deleted

## 2021-11-09 MED ORDER — ANASTROZOLE 1 MG PO TABS: 1.0000 mg | ORAL_TABLET | Freq: Every day | ORAL | 0 refills | Status: DC

## 2021-12-03 ENCOUNTER — Other Ambulatory Visit: Payer: Self-pay

## 2021-12-03 DIAGNOSIS — E1122 Type 2 diabetes mellitus with diabetic chronic kidney disease: Secondary | ICD-10-CM

## 2021-12-03 MED ORDER — RYBELSUS 3 MG PO TABS: 1.0000 | ORAL_TABLET | Freq: Every day | ORAL | 1 refills | Status: DC

## 2021-12-16 ENCOUNTER — Other Ambulatory Visit: Payer: Medicare Other

## 2021-12-16 ENCOUNTER — Ambulatory Visit: Payer: Medicare Other | Admitting: Hematology and Oncology

## 2021-12-24 ENCOUNTER — Ambulatory Visit: Payer: Medicare Other | Admitting: Internal Medicine

## 2021-12-24 ENCOUNTER — Encounter: Payer: Self-pay | Admitting: Internal Medicine

## 2021-12-24 VITALS — BP 112/78 | HR 87 | Ht 65.0 in | Wt 154.8 lb

## 2021-12-24 DIAGNOSIS — E785 Hyperlipidemia, unspecified: Secondary | ICD-10-CM

## 2021-12-24 DIAGNOSIS — E119 Type 2 diabetes mellitus without complications: Secondary | ICD-10-CM | POA: Diagnosis not present

## 2021-12-24 LAB — POCT GLYCOSYLATED HEMOGLOBIN (HGB A1C): Hemoglobin A1C: 5.8 % — AB (ref 4.0–5.6)

## 2021-12-24 NOTE — Patient Instructions (Addendum)
Please continue Rybelsus 3 mg daily in am.  Please return in 1 year.   PATIENT INSTRUCTIONS FOR TYPE 2 DIABETES:  **Please join MyChart!** - see attached instructions about how to join if you have not done so already.  DIET AND EXERCISE Diet and exercise is an important part of diabetic treatment.  We recommended aerobic exercise in the form of brisk walking (working between 40-60% of maximal aerobic capacity, similar to brisk walking) for 150 minutes per week (such as 30 minutes five days per week) along with 3 times per week performing 'resistance' training (using various gauge rubber tubes with handles) 5-10 exercises involving the major muscle groups (upper body, lower body and core) performing 10-15 repetitions (or near fatigue) each exercise. Start at half the above goal but build slowly to reach the above goals. If limited by weight, joint pain, or disability, we recommend daily walking in a swimming pool with water up to waist to reduce pressure from joints while allow for adequate exercise.    BLOOD GLUCOSES Monitoring your blood glucoses is important for continued management of your diabetes. Please check your blood glucoses 2-4 times a day: fasting, before meals and at bedtime (you can rotate these measurements - e.g. one day check before the 3 meals, the next day check before 2 of the meals and before bedtime, etc.).   HYPOGLYCEMIA (low blood sugar) Hypoglycemia is usually a reaction to not eating, exercising, or taking too much insulin/ other diabetes drugs.  Symptoms include tremors, sweating, hunger, confusion, headache, etc. Treat IMMEDIATELY with 15 grams of Carbs: 4 glucose tablets  cup regular juice/soda 2 tablespoons raisins 4 teaspoons sugar 1 tablespoon honey Recheck blood glucose in 15 mins and repeat above if still symptomatic/blood glucose <100.  RECOMMENDATIONS TO REDUCE YOUR RISK OF DIABETIC COMPLICATIONS: * Take your prescribed MEDICATION(S) * Follow a  DIABETIC diet: Complex carbs, fiber rich foods, (monounsaturated and polyunsaturated) fats * AVOID saturated/trans fats, high fat foods, >2,300 mg salt per day. * EXERCISE at least 5 times a week for 30 minutes or preferably daily.  * DO NOT SMOKE OR DRINK more than 1 drink a day. * Check your FEET every day. Do not wear tightfitting shoes. Contact us if you develop an ulcer * See your EYE doctor once a year or more if needed * Get a FLU shot once a year * Get a PNEUMONIA vaccine once before and once after age 32 years  GOALS:  * Your Hemoglobin A1c of <7%  * fasting sugars need to be 80-130 * after meals sugars need to be <180 (2h after you start eating) * Your Systolic BP should be 144 or lower  * Your Diastolic BP should be 80 or lower  * Your HDL (Good Cholesterol) should be 40 or higher  * Your LDL (Bad Cholesterol) should be ideally <70. * Your Triglycerides should be 150 or lower  * Your Urine microalbumin (kidney function) should be <30 * Your Body Mass Index should be 25 or lower   Please consider the following ways to cut down carbs and fat and increase fiber and micronutrients in your diet: - substitute whole grain for white bread or pasta - substitute brown rice for white rice - substitute 90-calorie flat bread pieces for slices of bread when possible - substitute sweet potatoes or yams for white potatoes - substitute humus for margarine - substitute tofu for cheese when possible - substitute almond or rice milk for regular milk (would not drink soy  milk daily due to concern for soy estrogen influence on breast cancer risk) - substitute dark chocolate for other sweets when possible - substitute water - can add lemon or orange slices for taste - for diet sodas (artificial sweeteners will trick your body that you can eat sweets without getting calories and will lead you to overeating and weight gain in the long run) - do not skip breakfast or other meals (this will slow down  the metabolism and will result in more weight gain over time)  - can try smoothies made from fruit and almond/rice milk in am instead of regular breakfast - can also try old-fashioned (not instant) oatmeal made with almond/rice milk in am - order the dressing on the side when eating salad at a restaurant (pour less than half of the dressing on the salad) - eat as little meat as possible - can try juicing, but should not forget that juicing will get rid of the fiber, so would alternate with eating raw veg./fruits or drinking smoothies - use as little oil as possible, even when using olive oil - can dress a salad with a mix of balsamic vinegar and lemon juice, for e.g. - use agave nectar, stevia sugar, or regular sugar rather than artificial sweateners - steam or broil/roast veggies  - snack on veggies/fruit/nuts (unsalted, preferably) when possible, rather than processed foods - reduce or eliminate aspartame in diet (it is in diet sodas, chewing gum, etc) Read the labels!  Try to read Dr. Janene Harvey book: "Program for Reversing Diabetes" for other ideas for healthy eating.

## 2021-12-24 NOTE — Progress Notes (Signed)
Patient ID: Erika Freeman, female   DOB: 1960/09/21, 61 y.o.   MRN: [id number]  HPI: Erika Freeman is a 61 y.o.-year-old female, returning for follow-up for DM2, dx in 2020, non-insulin-dependent, controlled, without long-term complications. Pt. previously saw Dr. Loanne Drilling, last visit 6 months ago. She is here with her husband.  Reviewed HbA1c: Lab Results  Component Value Date   HGBA1C 5.7 (A) 06/18/2021   HGBA1C 5.6 12/11/2020   HGBA1C 5.5 06/05/2020   HGBA1C 5.5 01/31/2020   HGBA1C 5.7 (A) 09/20/2019   HGBA1C 7.7 (A) 06/18/2019  02/24/2021: HbA1c 6.0%.  Pt is on a regimen of: - Rybelsus 3 mg daily in am  Pt checks her sugars 0-1x a day a day and they are: - am: 85-94 - 2h after b'fast: n/c - before lunch: n/c - 2h after lunch: 122 - before dinner: n/c - 2h after dinner: n/c - bedtime: n/c - nighttime: n/c Lowest sugar was 70s; she has hypoglycemia awareness at 70.  Highest sugar was 125.  Glucometer: [employer]  - + CKD, last BUN/creatinine:  02/24/2021: Sodium 135 - 146 MMOL/L 140   Potassium 3.5 - 5.3 MMOL/L 3.4 Low    Chloride 98 - 110 MMOL/L 105   CO2 21 - 31 MMOL/L 26   BUN 8 - 24 MG/DL 15   Glucose 70 - 99 MG/DL 74   Creatinine 0.60 - 1.20 MG/DL 0.85   Calcium 8.5 - 10.5 MG/DL 10.2   Total Protein 6.4 - 8.9 G/DL 7.4   Albumin  3.5 - 5.7 G/DL 4.6   Total Bilirubin 0.0 - 1.0 MG/DL 0.6   Alkaline Phosphatase 34 - 104 IU/L or U/L 64   AST (SGOT) 13 - 39 IU/L or U/L 19   ALT (SGPT) 7 - 52 IU/L or U/L 18   Anion Gap 4 - 14 MMOL/L 9   Est. GFR >=60 ML/MIN/1.73 M*2 79    Lab Results  Component Value Date   BUN 20 11/16/2020   BUN 14 11/13/2019   CREATININE 1.12 (H) 11/16/2020   CREATININE 1.26 (H) 11/13/2019   -+ HL; last set of lipids: 02/24/2021: LDL Direct <100 mg/dL 118 High    Total Cholesterol <200 MG/DL 180   Triglycerides <150 MG/DL 167 High    HDL Cholesterol >=60 MG/DL 40 Low    Total Chol / HDL Cholesterol <4.5 4.5 High     Non-HDL Cholesterol MG/DL 140   Coronary Heart Disease Risk Table     No results found for: "CHOL", "HDL", "LDLCALC", "LDLDIRECT", "TRIG", "CHOLHDL" On Lipitor 10 mg daily.  - last eye exam was in 2022. No DR reportedly. Coming up in 01/2021.  - no numbness and tingling in her feet.  Last foot exam-by Dr. Adah Perl 10/07/2021.  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  She previously had hyperparathyroidism and had to have parathyroidectomy.  She also has a history of HTN, anemia, GERD, hypomagnesemia.  ROS: + see HPI No increased urination, blurry vision, nausea, chest pain.  Past Medical History:  Diagnosis Date   Anemia    CKD (chronic kidney disease), stage III ([place])    nephologist-- dr Ria Comment [facility])  and transplant clinic [facility]   Diabetes mellitus without complication ([place])    Diverticulosis of colon    GERD (gastroesophageal reflux disease)    03-21-2019 -- per pt w/ spicy foods and at night time occasionally,  chews watermelon gum and pt  states it resolves   Heart murmur    03-21-2019  per pt was told had murmur some years ago, pt is asymptomatic (last echo in [facility] 05-31-2016  trivial regurg MR and TR)   History of end stage renal disease    secondary to FSGS with PD until kidney transplant 12/ 2015   History of radiation therapy 12/20/16  to 01/17/17   left breast 50.4 gy in 28 fractions   Hypercalcemia    pt had recent hospital admision _0  (in care everywhere) for acute kidney injury due to hypercalcemia and hypomagnesium,  resolved with medication, lov w/ transplant clinic _1  02-14-2019  calcium  normal per lab (care everywhere)    Hypertension    followed by transplant clinic _2 ---  (pt had stress echo 07-11-2013, negative for ischemia, in care everywhere)   Hypokalemia    pt hospital admission    Hypomagnesemia    Immunosuppression ([place])    due to kidney transplant 02-12-2014   Kidney transplant  status, living unrelated donor followed by transplant clinic _3 -- Dr Delane Ginger. [id number]   02-12-2014  _4    Malignant neoplasm of breast (female) [facility]) oncologist--- dr Jana Hakim (lov in epic no recurrence 01-18-2019)   dx 03/ 2018  IDC Stage IIB ---  completed chemotherapy 10-11-2016;  s/p left breast lumpectomy with node dissection 11-14-2016;  completed radiation 01-17-2017   Personal history of chemotherapy    left breast cancer--  06-01-2016  to 10-11-2016   Personal history of radiation therapy    Primary hyperparathyroidism [facility])    12/ 2020 w/ hypercalcimia  (pt has hx of hyperparathyoidism s/p right inferior parathyroidectomy 11-27-2006 _5 ;  per path adenoma)   Uterine fibroid    Past Surgical History:  Procedure Laterality Date   BREAST BIOPSY     BREAST LUMPECTOMY Left    2018   BREAST LUMPECTOMY WITH RADIOACTIVE SEED AND SENTINEL LYMPH NODE BIOPSY Left 11/14/2016   Procedure: LEFT BREAST RADIOACTIVE SEED BRACKETED LUMPECTOMY, LEFT AXILLARY SENTINEL LYMPH NODE BIOPSY WITH BLUE DYE INJECTION;  Surgeon: [name], MD;  Location: [place];  Service: General;  Laterality: Left;   BREAST SURGERY  2012   left - fibroadenoma   CAPD INSERTION  01/10/2012   Procedure: LAPAROSCOPIC INSERTION CONTINUOUS AMBULATORY PERITONEAL DIALYSIS  (CAPD) CATHETER;  Surgeon: Adin Hector, MD;  Location: [place];  Service: General;  Laterality: N/A;  LAPAROSCOPIC CAPD CATHETER PLACEMENT OMENTOPEXY   [place] Right 11-27-2006   dr gerkin _6    right inferior  (adenoma)   PARATHYROIDECTOMY Right 03/25/2019   Procedure: RIGHT SUPERIOR PARATHYROIDECTOMY;  Surgeon: Armandina Gemma, MD;  Location: [place];  Service: General;  Laterality: Right;   PORT-A-CATH REMOVAL Right 11/14/2016   Procedure: REMOVAL PORT-A-CATH;  Surgeon: [name], MD;  Location: [place];   Service: General;  Laterality: Right;   PORTACATH PLACEMENT Right 05/24/2016   Procedure: INSERTION PORT-A-CATH WITH [place];  Surgeon: [name], MD;  Location: [place];  Service: General;  Laterality: Right;   RENAL BIOPSY  2011   TUBAL LIGATION     UMBILICAL HERNIA REPAIR  01/10/2012   Procedure: HERNIA REPAIR UMBILICAL ADULT;  Surgeon: Adin Hector, MD;  Location: [place];  Service: General;  Laterality: N/A;   Social History   Socioeconomic History   Marital status: Married    Spouse name: Not  on file   Number of children: 3   Years of education: Not on file   Highest education level: Not on file  Occupational History   Not on file  Tobacco Use   Smoking status: Never   Smokeless tobacco: Never  Vaping Use   Vaping Use: Never used  Substance and Sexual Activity   Alcohol use: No   Drug use: Never   Sexual activity: Not on file  Other Topics Concern   Not on file  Social History Narrative   Not on file   Social Determinants of Health   Financial Resource Strain: Not on file  Food Insecurity: No Food Insecurity (09/20/2021)   Hunger Vital Sign    Worried About Running Out of Food in the Last Year: Never true    Ran Out of Food in the Last Year: Never true  Transportation Needs: No Transportation Needs (09/20/2021)   PRAPARE - Hydrologist (Medical): No    Lack of Transportation (Non-Medical): No  Physical Activity: Not on file  Stress: Not on file  Social Connections: Not on file  Intimate Partner Violence: Not on file   Current Outpatient Medications on File Prior to Visit  Medication Sig Dispense Refill   amLODipine (NORVASC) 10 MG tablet 1 tablet     anastrozole (ARIMIDEX) 1 MG tablet      anastrozole (ARIMIDEX) 1 MG tablet Take 1 tablet (1 mg total) by mouth daily. 90 tablet 0   aspirin 81 MG tablet Take 81 mg by mouth every morning.      atorvastatin (LIPITOR) 10 MG tablet 1 tablet     cholecalciferol (VITAMIN D3) 25 MCG (1000  UNIT) tablet Take 1,000 Units by mouth daily.     cinacalcet (SENSIPAR) 30 MG tablet 1 tablet with food or after a meal     FLOWFLEX COVID-19 AG HOME TEST KIT See admin instructions.     glucose blood (ACCU-CHEK AVIVA PLUS) test strip Use to check blood sugar once a day. 100 each 12   Magnesium Oxide (MAG-OXIDE) 200 MG TABS See admin instructions.     potassium chloride SA (KLOR-CON) 20 MEQ tablet Take 40 mEq by mouth 2 (two) times daily.     predniSONE (DELTASONE) 5 MG tablet Take 5 mg by mouth every morning.      promethazine (PHENERGAN) 12.5 MG tablet 1 tablet as needed     Semaglutide (RYBELSUS) 3 MG TABS Take 1 tablet by mouth daily. 90 tablet 1   tacrolimus (PROGRAF) 1 MG capsule See admin instructions.     No current facility-administered medications on file prior to visit.   No Known Allergies Family History  Problem Relation Age of Onset   Cancer Maternal Uncle        lung   Diabetes Maternal Grandmother    Cancer Cousin        colon   Colon cancer Neg Hx    Colon polyps Neg Hx    Esophageal cancer Neg Hx    Stomach cancer Neg Hx    Rectal cancer Neg Hx    Breast cancer Neg Hx    PE: BP 112/78 (BP Location: Left Arm, Patient Position: Sitting, Cuff Size: Normal)   Pulse 87   Ht 5' 5" (1.651 m)   Wt 154 lb 12.8 oz (70.2 kg)   LMP 04/27/2016 (Exact Date)   SpO2 98%   BMI 25.76 kg/m  Wt Readings from Last 3 Encounters:  12/24/21 154 lb  12.8 oz (70.2 kg)  03/25/21 155 lb (70.3 kg)  12/11/20 150 lb 9.6 oz (68.3 kg)   Constitutional: normal weight, in NAD Eyes: no exophthalmos ENT: no thyromegaly, no cervical lymphadenopathy, 2 parathyroidectomy scars healed Cardiovascular: RRR, No MRG Respiratory: CTA B Musculoskeletal: no deformities Skin: no rashes Neurological: no tremor with outstretched hands  ASSESSMENT: 1. DM2, non-insulin-dependent, controlled, without long-term complications -She does have a history of end stage renal disease 2/2 FSGS, s/p renal  transplant in 2015 -She developed diabetes after the transplant, but not immediately after, to qualify for NODAT (new onset diabetes after transplant)  2. HL  PLAN:  1. Patient with h/o diabetes developed approximately 5 years after her renal transplant and subsequent breast cancer diagnosis, on daily oral GLP-1 receptor agonist (Rybelsus low-dose, 3 mg daily), with excellent control.  Latest HbA1c obtained 6 months ago was lower, at 5.7%.  At today's visit, HbA1c was 5.8% (slightly higher). -she is on prednisone 5 mg daily in am -At today's visit, sugars appear to be at goal in the morning but she is not checking frequently.  We discussed about taking every day or every other day, rotating check times. -She tolerates Rybelsus well and she benefits from a minimal dose.  We will continue this regimen for now. -We discussed about simply continuing to see her PCP for this problem, but she would like to continue to see me - I suggested to:  Patient Instructions  Please continue Rybelsus 3 mg daily in am.  Please return in 1 year.   - discussed about CBG targets for treatment: 80-130 mg/dL before meals and <180 mg/dL after meals; target HbA1c <7%. - given foot care handout  - given instructions for hypoglycemia management "15-15 rule"  - advised for yearly eye exams  - Return to clinic in 1 year  2. HL - Reviewed latest lipid panel from 01/2021: LDL above target, triglycerides slightly high, HDL slightly low - Continues Lipitor 10 mg daily without side effects.  Philemon Kingdom, MD PhD [facility]

## 2021-12-24 NOTE — Addendum Note (Signed)
Addended by: Sarina Ill on: 12/24/2021 09:11 AM   Modules accepted: Orders

## 2021-12-29 ENCOUNTER — Other Ambulatory Visit: Payer: Self-pay | Admitting: *Deleted

## 2021-12-29 DIAGNOSIS — C50011 Malignant neoplasm of nipple and areola, right female breast: Secondary | ICD-10-CM

## 2021-12-30 ENCOUNTER — Inpatient Hospital Stay (HOSPITAL_BASED_OUTPATIENT_CLINIC_OR_DEPARTMENT_OTHER): Payer: Medicare Other | Admitting: Hematology and Oncology

## 2021-12-30 ENCOUNTER — Inpatient Hospital Stay: Payer: Medicare Other | Attending: Hematology and Oncology

## 2021-12-30 ENCOUNTER — Encounter: Payer: Self-pay | Admitting: Hematology and Oncology

## 2021-12-30 VITALS — BP 129/89 | HR 82 | Temp 97.7°F | Resp 16 | Wt 153.8 lb

## 2021-12-30 DIAGNOSIS — C50812 Malignant neoplasm of overlapping sites of left female breast: Secondary | ICD-10-CM | POA: Insufficient documentation

## 2021-12-30 DIAGNOSIS — Z170 Estrogen receptor positive status [ER+]: Secondary | ICD-10-CM | POA: Diagnosis not present

## 2021-12-30 DIAGNOSIS — C50011 Malignant neoplasm of nipple and areola, right female breast: Secondary | ICD-10-CM | POA: Diagnosis not present

## 2021-12-30 DIAGNOSIS — Z79811 Long term (current) use of aromatase inhibitors: Secondary | ICD-10-CM | POA: Insufficient documentation

## 2021-12-30 LAB — CBC WITH DIFFERENTIAL (CANCER CENTER ONLY)
Abs Immature Granulocytes: 0.05 10*3/uL (ref 0.00–0.07)
Basophils Absolute: 0 10*3/uL (ref 0.0–0.1)
Basophils Relative: 0 %
Eosinophils Absolute: 0.1 10*3/uL (ref 0.0–0.5)
Eosinophils Relative: 1 %
HCT: 40.2 % (ref 36.0–46.0)
Hemoglobin: 13.2 g/dL (ref 12.0–15.0)
Immature Granulocytes: 0 %
Lymphocytes Relative: 15 %
Lymphs Abs: 1.9 10*3/uL (ref 0.7–4.0)
MCH: 28.6 pg (ref 26.0–34.0)
MCHC: 32.8 g/dL (ref 30.0–36.0)
MCV: 87.2 fL (ref 80.0–100.0)
Monocytes Absolute: 0.4 10*3/uL (ref 0.1–1.0)
Monocytes Relative: 3 %
Neutro Abs: 10.2 10*3/uL — ABNORMAL HIGH (ref 1.7–7.7)
Neutrophils Relative %: 81 %
Platelet Count: 209 10*3/uL (ref 150–400)
RBC: 4.61 MIL/uL (ref 3.87–5.11)
RDW: 12.9 % (ref 11.5–15.5)
WBC Count: 12.6 10*3/uL — ABNORMAL HIGH (ref 4.0–10.5)
nRBC: 0 % (ref 0.0–0.2)

## 2021-12-30 LAB — CMP (CANCER CENTER ONLY)
ALT: 17 U/L (ref 0–44)
AST: 18 U/L (ref 15–41)
Albumin: 4.4 g/dL (ref 3.5–5.0)
Alkaline Phosphatase: 63 U/L (ref 38–126)
Anion gap: 5 (ref 5–15)
BUN: 17 mg/dL (ref 6–20)
CO2: 31 mmol/L (ref 22–32)
Calcium: 10.4 mg/dL — ABNORMAL HIGH (ref 8.9–10.3)
Chloride: 106 mmol/L (ref 98–111)
Creatinine: 1.13 mg/dL — ABNORMAL HIGH (ref 0.44–1.00)
GFR, Estimated: 56 mL/min — ABNORMAL LOW (ref >60)
Glucose, Bld: 143 mg/dL — ABNORMAL HIGH (ref 70–99)
Potassium: 3.7 mmol/L (ref 3.5–5.1)
Sodium: 142 mmol/L (ref 135–145)
Total Bilirubin: 0.6 mg/dL (ref 0.3–1.2)
Total Protein: 7.6 g/dL (ref 6.5–8.1)

## 2021-12-30 NOTE — Progress Notes (Signed)
[place]  [phone] [phone]     ID: TAKAKO MINCKLER DOB: 1960/08/24  MR#: [id number]  NUU#:[id number]  Patient Care Team: Scheryl Marten, [place] as PCP - General (Internal Medicine) [name], MD (Inactive) as Consulting Physician (Nephrology) [name], MD (Inactive) as Consulting Physician (Obstetrics and Gynecology) [name], MD as Consulting Physician (General Surgery) Magrinat, [name], MD (Inactive) as Consulting Physician (Oncology) [name], MD as Consulting Physician (Radiation Oncology) [name], MD as Referring Physician (Surgery) [name], DO (Internal Medicine) Neldon Labella, RN as Case Manager OTHER MD:  CHIEF COMPLAINT: Estrogen receptor positive breast cancer  CURRENT TREATMENT: anastrozole    INTERVAL HISTORY:   [name] returns today for follow-up of her estrogen receptor positive breast cancer.  She is accompanied by her husband She continues on anastrozole daily with good tolerance.  She has mild occasional hot flashes as the only side effect She is excited about being done with the anastrozole.  Mammogram in September showed no evidence of malignancy. Rest of the pertinent 10 point ROS reviewed and negative   COVID 19 VACCINATION STATUS: Status post Pfizer x4 as of September 2022   BREAST CANCER HISTORY: From the original intake note:  "[name]" herself noted a mass in her left breast. She ignored it because she thought it was related to menstruation. However as it persisted through 1. She brought it to Dr. [name] attention and on 05/10/2016 she underwent left diagnostic mammography with tomography and [facility]. The breast density was category C. In the left breast lower central area there was a new circumscribed mass. This was firm and fixed in the lower inner left breast at middle depth. Ultrasonography confirmed a 2.6 cm  hypoechoic irregular mass at the 6:30 o'clock radiant 3 cm from the nipple. There was also a 0.5 cm nodule 1.4 cm medial to the mass just described.  Biopsy of the left breast mass in question 05/10/2016 showed([id number]) invasive ductal carcinoma, grade 3, estrogen receptor 70% positive with weak staining intensity, progesterone receptor negative, with no HER-2 amplification, the signals ratio being 1.52 and the number per cell 2.35. The proliferation marker was 90%.  Her subsequent history is as detailed below  OF NOTE: The patient has a history of focal segmental glomerular sclerosis with end-stage renal disease and is status post unrelated donor renal transplant 04/15/2013 as part of appeared donor exchange with her husband donating one kidney, the other harvested from a 60 year old [place] man. She continues on immunosuppression with mycophenolate and tacrolimus.   PAST MEDICAL HISTORY: Past Medical History:  Diagnosis Date   Anemia    CKD (chronic kidney disease), stage III ([place])    nephologist-- dr Ria Comment [facility])  and transplant clinic [facility]   Diabetes mellitus without complication ([place])    Diverticulosis of colon    GERD (gastroesophageal reflux disease)    03-21-2019 -- per pt w/ spicy foods and at night time occasionally,  chews watermelon gum and pt states it resolves   Heart murmur    03-21-2019  per pt was told had murmur some years ago, pt is asymptomatic (last echo in [facility] 05-31-2016  trivial regurg MR and TR)   History of end stage renal disease    secondary to FSGS with PD until kidney transplant 12/ 2015   History of radiation therapy 12/20/16  to 01/17/17   left breast 50.4 gy in 28 fractions   Hypercalcemia  pt had recent hospital admision _0  (in care everywhere) for acute kidney injury due to hypercalcemia and hypomagnesium,  resolved with medication, lov w/ transplant clinic _1  02-14-2019  calcium  normal per lab (care everywhere)     Hypertension    followed by transplant clinic _2 ---  (pt had stress echo 07-11-2013, negative for ischemia, in care everywhere)   Hypokalemia    pt hospital admission    Hypomagnesemia    Immunosuppression ([place])    due to kidney transplant 02-12-2014   Kidney transplant status, living unrelated donor followed by transplant clinic _3 -- Dr Delane Ginger. [id number]   02-12-2014  _4    Malignant neoplasm of breast (female) [facility]) oncologist--- dr Jana Hakim (lov in epic no recurrence 01-18-2019)   dx 03/ 2018  IDC Stage IIB ---  completed chemotherapy 10-11-2016;  s/p left breast lumpectomy with node dissection 11-14-2016;  completed radiation 01-17-2017   Personal history of chemotherapy    left breast cancer--  06-01-2016  to 10-11-2016   Personal history of radiation therapy    Primary hyperparathyroidism [facility])    12/ 2020 w/ hypercalcimia  (pt has hx of hyperparathyoidism s/p right inferior parathyroidectomy 11-27-2006 _5 ;  per path adenoma)   Uterine fibroid     PAST SURGICAL HISTORY: Past Surgical History:  Procedure Laterality Date   BREAST BIOPSY     BREAST LUMPECTOMY Left    2018   BREAST LUMPECTOMY WITH RADIOACTIVE SEED AND SENTINEL LYMPH NODE BIOPSY Left 11/14/2016   Procedure: LEFT BREAST RADIOACTIVE SEED BRACKETED LUMPECTOMY, LEFT AXILLARY SENTINEL LYMPH NODE BIOPSY WITH BLUE DYE INJECTION;  Surgeon: [name], MD;  Location: [place];  Service: General;  Laterality: Left;   BREAST SURGERY  2012   left - fibroadenoma   CAPD INSERTION  01/10/2012   Procedure: LAPAROSCOPIC INSERTION CONTINUOUS AMBULATORY PERITONEAL DIALYSIS  (CAPD) CATHETER;  Surgeon: Adin Hector, MD;  Location: [place];  Service: General;  Laterality: N/A;  LAPAROSCOPIC CAPD CATHETER PLACEMENT OMENTOPEXY   [place] Right 11-27-2006   dr gerkin _6    right inferior  (adenoma)    PARATHYROIDECTOMY Right 03/25/2019   Procedure: RIGHT SUPERIOR PARATHYROIDECTOMY;  Surgeon: Armandina Gemma, MD;  Location: [place];  Service: General;  Laterality: Right;   PORT-A-CATH REMOVAL Right 11/14/2016   Procedure: REMOVAL PORT-A-CATH;  Surgeon: [name], MD;  Location: [place];  Service: General;  Laterality: Right;   PORTACATH PLACEMENT Right 05/24/2016   Procedure: INSERTION PORT-A-CATH WITH [place];  Surgeon: [name], MD;  Location: [place];  Service: General;  Laterality: Right;   RENAL BIOPSY  2011   TUBAL LIGATION     UMBILICAL HERNIA REPAIR  01/10/2012   Procedure: HERNIA REPAIR UMBILICAL ADULT;  Surgeon: Adin Hector, MD;  Location: [place];  Service: General;  Laterality: N/A;    FAMILY HISTORY Family History  Problem Relation Age of Onset   Cancer Maternal Uncle        lung   Diabetes Maternal Grandmother    Cancer Cousin        colon   Colon cancer Neg Hx    Colon polyps Neg Hx    Esophageal cancer Neg Hx    Stomach cancer Neg Hx    Rectal cancer Neg Hx    Breast cancer Neg Hx   The patient has very little information regarding her father.  Her mother is living, 41 years old as of March 2018. The patient had one full brother, diagnosed with prostate cancer in his 75s. The patient has 2 half-brothers and 1 half-sister. There is no history of breast or ovarian cancer in the family to the patient's knowledge    GYNECOLOGIC HISTORY:  Patient's last menstrual period was 04/27/2016 (exact date).  Menarche age 29, first live birth age 28, the patient is GX P3. Her periods are regular, the last 5 or 6 days, with no heavy days. 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 Her husband [name] works at home [employer]. Son [name] lives in [place] and work as Freight forwarder of a Therapist, art. Son [name] lives in [place] and is a Sales executive. Son [name] lives in [place] and is studying based trombone. The patient has 2 grandchildren. She attends a local [place]: In the absence of any documents to the contrary her husband is her healthcare     HEALTH MAINTENANCE: Social History   Tobacco Use   Smoking status: Never   Smokeless tobacco: Never  Vaping Use   Vaping Use: Never used  Substance Use Topics   Alcohol use: No   Drug use: Never     Colonoscopy:  PAP:  Bone density:   No Known Allergies  Current Outpatient Medications  Medication Sig Dispense Refill   amLODipine (NORVASC) 10 MG tablet 1 tablet     anastrozole (ARIMIDEX) 1 MG tablet      anastrozole (ARIMIDEX) 1 MG tablet Take 1 tablet (1 mg total) by mouth daily. 90 tablet 0   aspirin 81 MG tablet Take 81 mg by mouth every morning.      atorvastatin (LIPITOR) 10 MG tablet 1 tablet     cholecalciferol (VITAMIN D3) 25 MCG (1000 UNIT) tablet Take 1,000 Units by mouth daily.     FLOWFLEX COVID-19 AG HOME TEST KIT See admin instructions.     glucose blood (ACCU-CHEK AVIVA PLUS) test strip Use to check blood sugar once a day. 100 each 12   Magnesium Oxide (MAG-OXIDE) 200 MG TABS See admin instructions.     potassium chloride SA (KLOR-CON) 20 MEQ tablet Take 40 mEq by mouth 2 (two) times daily.     predniSONE (DELTASONE) 5 MG tablet Take 5 mg by mouth every morning.      promethazine (PHENERGAN) 12.5 MG tablet 1 tablet as needed     Semaglutide (RYBELSUS) 3 MG TABS Take 1 tablet by mouth daily. 90 tablet 1   tacrolimus (PROGRAF) 1 MG capsule See admin instructions.     No current facility-administered medications for this visit.    OBJECTIVE:  Vitals:   12/30/21 1342  BP: 129/89  Pulse: 82  Resp: 16  Temp: 97.7 F (36.5 C)  SpO2: 98%     Body mass index is 25.59 kg/m.  Filed Weights   12/30/21 1342  Weight: 153 lb 12.8 oz (69.8 kg)   Filed Weights   12/30/21 1342  Weight: 153 lb 12.8  oz (69.8 kg)   ECOG FS:1 - Symptomatic but completely ambulatory   Physical Exam Constitutional:      Appearance: Normal appearance.  Chest:     Comments: Bilateral breasts inspected.  No palpable masses or regional adenopathy. Musculoskeletal:     Cervical back: Normal range of motion and neck supple. No  rigidity.  Neurological:     Mental Status: She is alert.      LAB RESULTS:  CMP     Component Value Date/Time   NA 143 11/16/2020 1341   NA 142 02/14/2017 1012   K 3.6 11/16/2020 1341   K 2.8 (LL) 02/14/2017 1012   CL 106 11/16/2020 1341   CO2 26 11/16/2020 1341   CO2 30 (H) 02/14/2017 1012   GLUCOSE 113 (H) 11/16/2020 1341   GLUCOSE 104 02/14/2017 1012   BUN 20 11/16/2020 1341   BUN 14.1 02/14/2017 1012   CREATININE 1.12 (H) 11/16/2020 1341   CREATININE 1.2 (H) 02/14/2017 1012   CALCIUM 10.6 (H) 11/16/2020 1341   CALCIUM 9.6 02/14/2017 1012   PROT 7.9 11/16/2020 1341   PROT 7.8 02/14/2017 1012   ALBUMIN 4.3 11/16/2020 1341   ALBUMIN 4.4 02/14/2017 1012   AST 17 11/16/2020 1341   AST 15 02/14/2017 1012   ALT 17 11/16/2020 1341   ALT 15 02/14/2017 1012   ALKPHOS 81 11/16/2020 1341   ALKPHOS 72 02/14/2017 1012   BILITOT 0.5 11/16/2020 1341   BILITOT 0.53 02/14/2017 1012   GFRNONAA 57 (L) 11/16/2020 1341   GFRAA 54 (L) 11/13/2019 1408    No results found for: "TOTALPROTELP", "ALBUMINELP", "A1GS", "A2GS", "BETS", "BETA2SER", "GAMS", "MSPIKE", "SPEI"  No results found for: "KPAFRELGTCHN", "LAMBDASER", "KAPLAMBRATIO"  Lab Results  Component Value Date   WBC 12.6 (H) 12/30/2021   NEUTROABS 10.2 (H) 12/30/2021   HGB 13.2 12/30/2021   HCT 40.2 12/30/2021   MCV 87.2 12/30/2021   PLT 209 12/30/2021      Chemistry      Component Value Date/Time   NA 143 11/16/2020 1341   NA 142 02/14/2017 1012   K 3.6 11/16/2020 1341   K 2.8 (LL) 02/14/2017 1012   CL 106 11/16/2020 1341   CO2 26 11/16/2020 1341   CO2 30 (H) 02/14/2017 1012   BUN 20 11/16/2020 1341    BUN 14.1 02/14/2017 1012   CREATININE 1.12 (H) 11/16/2020 1341   CREATININE 1.2 (H) 02/14/2017 1012      Component Value Date/Time   CALCIUM 10.6 (H) 11/16/2020 1341   CALCIUM 9.6 02/14/2017 1012   ALKPHOS 81 11/16/2020 1341   ALKPHOS 72 02/14/2017 1012   AST 17 11/16/2020 1341   AST 15 02/14/2017 1012   ALT 17 11/16/2020 1341   ALT 15 02/14/2017 1012   BILITOT 0.5 11/16/2020 1341   BILITOT 0.53 02/14/2017 1012       No results found for: "LABCA2"  No components found for: "KPTWSF681"  No results for input(s): "INR" in the last 168 hours.  Urinalysis    Component Value Date/Time   COLORURINE YELLOW 11/24/2006 1539   APPEARANCEUR CLEAR 11/24/2006 1539   LABSPEC 1.018 11/24/2006 1539   PHURINE 6.0 11/24/2006 1539   GLUCOSEU NEGATIVE 11/24/2006 1539   HGBUR SMALL (A) 11/24/2006 1539   BILIRUBINUR NEGATIVE 11/24/2006 1539   KETONESUR NEGATIVE 11/24/2006 1539   PROTEINUR 100 (A) 11/24/2006 1539   UROBILINOGEN 1.0 11/24/2006 1539   NITRITE NEGATIVE 11/24/2006 1539   LEUKOCYTESUR MODERATE (A) 11/24/2006 1539    STUDIES: No results found.    ELIGIBLE FOR AVAILABLE RESEARCH PROTOCOL: no  ASSESSMENT: 61 y.o. [place], [place] woman status post left breast lower inner quadrant biopsy 05/10/2016 for a clinical  T2 N0, prognostic stage IIB invaside ductal carcinoma, grade 3, weekly estrogen receptor positive, progesterone receptor negative, with no HER-2 amplification, and the MIB-1 at 90%  (  a) biopsy of the 0.6 mm satellite at the 6:30 o'clock radiant in the left breast 06/02/2016 showed invasive ductal carcinoma, grade 3, estrogen receptor 20% positive, with weak staining intensity, progesterone receptor negative with no HER-2 amplification, and an MIB-1 of 70% (identical to larger mass)   (1) neoadjuvant chemotherapy consisting of doxorubicin and cyclophosphamide in dose dense fashion 4 started 06/01/2016, completed 07/12/2016  followed by Paclitaxel weekly 12 started 07/26/2016,  completed 10/11/2016  (2) definitive surgery 11/14/2016 found a residual pT1a pN0 area of invasive ductal carcinoma (0.2 mm), with negative margins.  (3) adjuvant radiation 12/20/16-01/17/17  Site/dose:   Left Breast, 50.4 Gy total delivered in 28 fractions   (4) started anastrozole 02/28/2017  (a) left heel DEXA scan 11/24/2004 was normal  (b) renal feels any additional radiation may be detrimental so bone density not updated  (C) repeat bone density 12/17/2019 showed a T score of -0.1  (5) hypercalcemia with worsening renal function  (a) denosumab 60 mg and IV fluids given 08/03/2018  (b) bone scan 08/10/2018 shows no evidence of metastatic disease.   PLAN:  Patient is here for follow-up with her husband.  She has been doing really well with anastrozole.  She is excited about being done with it.  She however wants to continue annual follow-up in the breast cancer center.  She most recently had a mammogram which did not show any evidence of malignancy.  Physical examination concurs, no concern for malignancy or regional adenopathy.  We have discussed about some patient is taking anastrozole for longer than 5 years but she may not be a candidate for it since her tumor was weakly ER positive.  She is agreeable to all those recommendations.  Self breast exam recommended.   Exercise recommended. Return to clinic in 1 year or sooner as needed.  Total time spent: 30 min *Total Encounter Time as defined by the Centers for Medicare and Medicaid Services includes, in addition to the face-to-face time of a patient visit (documented in the note above) non-face-to-face time: obtaining and reviewing outside history, ordering and reviewing medications, tests or procedures, care coordination (communications with other health care professionals or caregivers) and documentation in the medical record.

## 2022-01-06 ENCOUNTER — Ambulatory Visit: Payer: Medicare Other | Admitting: Podiatry

## 2022-01-06 ENCOUNTER — Encounter: Payer: Self-pay | Admitting: Podiatry

## 2022-01-06 DIAGNOSIS — M79674 Pain in right toe(s): Secondary | ICD-10-CM

## 2022-01-06 DIAGNOSIS — B351 Tinea unguium: Secondary | ICD-10-CM | POA: Diagnosis not present

## 2022-01-06 DIAGNOSIS — E1122 Type 2 diabetes mellitus with diabetic chronic kidney disease: Secondary | ICD-10-CM | POA: Diagnosis not present

## 2022-01-06 DIAGNOSIS — M79675 Pain in left toe(s): Secondary | ICD-10-CM

## 2022-01-06 DIAGNOSIS — Q828 Other specified congenital malformations of skin: Secondary | ICD-10-CM | POA: Diagnosis not present

## 2022-01-06 DIAGNOSIS — L84 Corns and callosities: Secondary | ICD-10-CM

## 2022-01-06 DIAGNOSIS — N1831 Chronic kidney disease, stage 3a: Secondary | ICD-10-CM

## 2022-01-06 NOTE — Progress Notes (Signed)
  Subjective:  Patient ID: Erika Freeman, female    DOB: 16-Aug-1960,  MRN: [id number]  Erika Freeman presents to clinic today for preventative diabetic foot care and corn(s)  left lower extremity, porokeratotic lesion(s) right lower extremity and painful mycotic nails. Painful toenails interfere with ambulation. Aggravating factors include wearing enclosed shoe gear. Pain is relieved with periodic professional debridement. Painful corns and porokeratotic lesion(s) aggravated when weightbearing with and without shoegear. Pain is relieved with periodic professional debridement.  Chief Complaint  Patient presents with   Nail Problem    DFC BG - pt does not remember last time she checked BG A1C - 5.8 PCP - [name] , last OV January 2023   New problem(s): None.   PCP is [place], [place], [place].  No Known Allergies  Review of Systems: Negative except as noted in the HPI.  Objective:  Erika Freeman is a pleasant 61 y.o. female WD, WN in NAD. AAO x 3.  Vascular Examination: CFT <3 seconds b/l. DP/PT pulses faintly palpable b/l. Skin temperature gradient warm to warm b/l. No ischemia or gangrene. No cyanosis or clubbing noted b/l.    Neurological Examination: Sensation grossly intact b/l with 10 gram monofilament. Vibratory sensation intact b/l.   Dermatological Examination: Pedal skin warm and supple b/l. No open wounds b/l LE. No interdigital macerations noted b/l LE. Toenails 1-5 b/l elongated, discolored, dystrophic, thickened, crumbly with subungual debris and tenderness to dorsal palpation. Hyperkeratotic lesion(s) L 5th toe.  No erythema, no edema, no drainage, no fluctuance. Porokeratotic lesion(s) submet head 3 right foot. No erythema, no edema, no drainage, no fluctuance.  Musculoskeletal Examination: Muscle strength 5/5 to b/l LE. Pes planus deformity noted bilateral LE.  Radiographs: None  Assessment/Plan: 1. Pain due to onychomycosis of toenails of both  feet   2. Corns   3. Porokeratosis   4. Type 2 diabetes mellitus with stage 3a chronic kidney disease, without long-term current use of insulin ([place])     No orders of the defined types were placed in this encounter.   -Patient was evaluated and treated. All patient's and/or POA's questions/concerns answered on today's visit. -Toenails 1-5 b/l were debrided in length and girth with sterile nail nippers and dremel without iatrogenic bleeding.  -Corn(s) L 5th toe pared utilizing sterile scalpel blade without complication or incident. Total number debrided=1. -Porokeratotic lesion(s) submet head 3 right foot pared and enucleated with sterile currette without incident. Total number of lesions debrided=1. -Patient/POA to call should there be question/concern in the interim.   Return in about 3 months (around 04/08/2022).  Marzetta Board, DPM

## 2022-02-10 DIAGNOSIS — N390 Urinary tract infection, site not specified: Secondary | ICD-10-CM | POA: Diagnosis not present

## 2022-02-10 DIAGNOSIS — Z940 Kidney transplant status: Secondary | ICD-10-CM | POA: Diagnosis not present

## 2022-02-11 DIAGNOSIS — H524 Presbyopia: Secondary | ICD-10-CM | POA: Diagnosis not present

## 2022-02-11 DIAGNOSIS — E119 Type 2 diabetes mellitus without complications: Secondary | ICD-10-CM | POA: Diagnosis not present

## 2022-03-17 DIAGNOSIS — I10 Essential (primary) hypertension: Secondary | ICD-10-CM | POA: Diagnosis not present

## 2022-03-17 DIAGNOSIS — N058 Unspecified nephritic syndrome with other morphologic changes: Secondary | ICD-10-CM | POA: Diagnosis not present

## 2022-03-17 DIAGNOSIS — C50919 Malignant neoplasm of unspecified site of unspecified female breast: Secondary | ICD-10-CM | POA: Diagnosis not present

## 2022-03-17 DIAGNOSIS — E139 Other specified diabetes mellitus without complications: Secondary | ICD-10-CM | POA: Diagnosis not present

## 2022-03-17 DIAGNOSIS — Z940 Kidney transplant status: Secondary | ICD-10-CM | POA: Diagnosis not present

## 2022-03-17 DIAGNOSIS — E876 Hypokalemia: Secondary | ICD-10-CM | POA: Diagnosis not present

## 2022-03-17 DIAGNOSIS — E212 Other hyperparathyroidism: Secondary | ICD-10-CM | POA: Diagnosis not present

## 2022-03-31 ENCOUNTER — Ambulatory Visit: Payer: Medicare Other | Admitting: Podiatry

## 2022-05-05 ENCOUNTER — Ambulatory Visit: Payer: Medicare Other | Admitting: Podiatry

## 2022-05-12 DIAGNOSIS — Z940 Kidney transplant status: Secondary | ICD-10-CM | POA: Diagnosis not present

## 2022-05-19 ENCOUNTER — Ambulatory Visit: Payer: Medicare Other | Admitting: Podiatry

## 2022-05-22 ENCOUNTER — Other Ambulatory Visit: Payer: Self-pay | Admitting: Internal Medicine

## 2022-05-22 DIAGNOSIS — N1831 Chronic kidney disease, stage 3a: Secondary | ICD-10-CM

## 2022-06-23 ENCOUNTER — Ambulatory Visit: Payer: Medicare Other | Admitting: Podiatry

## 2022-06-23 DIAGNOSIS — M79675 Pain in left toe(s): Secondary | ICD-10-CM | POA: Diagnosis not present

## 2022-06-23 DIAGNOSIS — L84 Corns and callosities: Secondary | ICD-10-CM

## 2022-06-23 DIAGNOSIS — E1122 Type 2 diabetes mellitus with diabetic chronic kidney disease: Secondary | ICD-10-CM | POA: Diagnosis not present

## 2022-06-23 DIAGNOSIS — M79674 Pain in right toe(s): Secondary | ICD-10-CM | POA: Diagnosis not present

## 2022-06-23 DIAGNOSIS — B351 Tinea unguium: Secondary | ICD-10-CM

## 2022-06-23 DIAGNOSIS — Q828 Other specified congenital malformations of skin: Secondary | ICD-10-CM | POA: Diagnosis not present

## 2022-06-23 DIAGNOSIS — N1831 Chronic kidney disease, stage 3a: Secondary | ICD-10-CM

## 2022-06-23 NOTE — Progress Notes (Signed)
  Subjective:  Patient ID: Erika Freeman, female    DOB: 03/01/1960,  MRN: [id number]  Erika Freeman presents to clinic today for at risk foot care. Pt has h/o NIDDM with chronic kidney disease and painful porokeratotic lesion(s) both feet and painful mycotic toenails that limit ambulation. Painful toenails interfere with ambulation. Aggravating factors include wearing enclosed shoe gear. Pain is relieved with periodic professional debridement. Painful porokeratotic lesions are aggravated when weightbearing with and without shoegear. Pain is relieved with periodic professional debridement.  Chief Complaint  Patient presents with   Diabetic foot care   New problem(s): None.   PCP is [place], [place], [place].  No Known Allergies  Review of Systems: Negative except as noted in the HPI.  Objective: No changes noted in today's physical examination. There were no vitals filed for this visit. Erika Freeman is a pleasant 62 y.o. female WD, WN in NAD. AAO x 3.  Vascular Examination: CFT <3 seconds b/l. DP/PT pulses faintly palpable b/l. Skin temperature gradient warm to warm b/l. No ischemia or gangrene. No cyanosis or clubbing noted b/l.    Neurological Examination: Sensation grossly intact b/l with 10 gram monofilament. Vibratory sensation intact b/l.   Dermatological Examination: Pedal skin warm and supple b/l. No open wounds b/l LE. No interdigital macerations noted b/l LE.   Toenails 1-5 b/l elongated, discolored, dystrophic, thickened, crumbly with subungual debris and tenderness to dorsal palpation.   Hyperkeratotic lesion(s) submet head 1 right foot,  sub 3 left foot.  No erythema, no edema, no drainage, no fluctuance.   Porokeratotic lesion(s) submet head 3 right foot. No erythema, no edema, no drainage, no fluctuance.  Musculoskeletal Examination: Muscle strength 5/5 to b/l LE. Pes planus deformity noted bilateral LE.  Radiographs: None  Assessment/Plan: 1. Pain due  to onychomycosis of toenails of both feet   2. Callus   3. Porokeratosis   4. Type 2 diabetes mellitus with stage 3a chronic kidney disease, without long-term current use of insulin (HCC)     -Consent given for treatment as described below: -Examined patient. -Mycotic toenails 1-5 bilaterally were debrided in length and girth with sterile nail nippers and dremel without incident. -Callus(es) submet head 3 right foot pared utilizing sterile scalpel blade without complication or incident. Total number debrided =1. -Porokeratotic lesion(s) submet head 1 right foot and submet head 3 left foot pared and enucleated with sterile currette without incident. Total number of lesions debrided=2. -Patient/POA to call should there be question/concern in the interim.   Return in about 3 months (around 09/22/2022).  Freddie Breech, DPM

## 2022-06-24 ENCOUNTER — Other Ambulatory Visit (HOSPITAL_COMMUNITY): Payer: Self-pay | Admitting: Internal Medicine

## 2022-06-24 DIAGNOSIS — Z136 Encounter for screening for cardiovascular disorders: Secondary | ICD-10-CM

## 2022-06-25 ENCOUNTER — Encounter: Payer: Self-pay | Admitting: Podiatry

## 2022-06-28 ENCOUNTER — Ambulatory Visit: Payer: Medicare Other | Admitting: Internal Medicine

## 2022-07-19 ENCOUNTER — Other Ambulatory Visit (HOSPITAL_COMMUNITY): Payer: Medicare Other

## 2022-07-19 ENCOUNTER — Encounter (HOSPITAL_COMMUNITY): Payer: Self-pay

## 2022-08-25 DIAGNOSIS — Z940 Kidney transplant status: Secondary | ICD-10-CM | POA: Diagnosis not present

## 2022-09-21 DIAGNOSIS — K219 Gastro-esophageal reflux disease without esophagitis: Secondary | ICD-10-CM | POA: Diagnosis not present

## 2022-09-21 DIAGNOSIS — I10 Essential (primary) hypertension: Secondary | ICD-10-CM | POA: Diagnosis not present

## 2022-09-21 DIAGNOSIS — Z940 Kidney transplant status: Secondary | ICD-10-CM | POA: Diagnosis not present

## 2022-09-21 DIAGNOSIS — Z0000 Encounter for general adult medical examination without abnormal findings: Secondary | ICD-10-CM | POA: Diagnosis not present

## 2022-09-21 DIAGNOSIS — E785 Hyperlipidemia, unspecified: Secondary | ICD-10-CM | POA: Diagnosis not present

## 2022-09-21 DIAGNOSIS — Z853 Personal history of malignant neoplasm of breast: Secondary | ICD-10-CM | POA: Diagnosis not present

## 2022-09-21 DIAGNOSIS — E1165 Type 2 diabetes mellitus with hyperglycemia: Secondary | ICD-10-CM | POA: Diagnosis not present

## 2022-09-21 DIAGNOSIS — G4762 Sleep related leg cramps: Secondary | ICD-10-CM | POA: Diagnosis not present

## 2022-09-22 ENCOUNTER — Ambulatory Visit: Payer: Medicare Other | Admitting: Podiatry

## 2022-09-23 DIAGNOSIS — E1165 Type 2 diabetes mellitus with hyperglycemia: Secondary | ICD-10-CM | POA: Diagnosis not present

## 2022-09-23 DIAGNOSIS — I10 Essential (primary) hypertension: Secondary | ICD-10-CM | POA: Diagnosis not present

## 2022-09-23 DIAGNOSIS — E785 Hyperlipidemia, unspecified: Secondary | ICD-10-CM | POA: Diagnosis not present

## 2022-10-05 ENCOUNTER — Ambulatory Visit: Payer: Medicare Other | Admitting: Podiatry

## 2022-10-05 DIAGNOSIS — L84 Corns and callosities: Secondary | ICD-10-CM | POA: Diagnosis not present

## 2022-10-05 DIAGNOSIS — E1151 Type 2 diabetes mellitus with diabetic peripheral angiopathy without gangrene: Secondary | ICD-10-CM | POA: Diagnosis not present

## 2022-10-05 DIAGNOSIS — B351 Tinea unguium: Secondary | ICD-10-CM | POA: Diagnosis not present

## 2022-10-05 NOTE — Progress Notes (Unsigned)
    Subjective:  Patient ID: Erika Freeman, female    DOB: 1960/08/20,  MRN: [id number]  Erika Freeman presents to clinic today for:  Chief Complaint  Patient presents with   Nail Problem    Diabetic Foot Care- nail trim  PCP- Evangeline Dakin, PA Last Visit- one week ago   . Patient notes nails are thick and elongated, causing pain in shoe gear when ambulating.  She also has a painful callus right submet 3.  PCP is [place], [place], [place].  No Known Allergies  Review of Systems: Negative except as noted in the HPI.  Objective:  There were no vitals filed for this visit.  Erika Freeman is a pleasant 62 y.o. female in NAD. AAO x 3.  Vascular Examination: Patient has palpable DP pulse, absent PT pulse bilateral.  Delayed capillary refill bilateral toes.  Sparse digital hair bilateral.  Proximal to distal cooling WNL bilateral.    Dermatological Examination: [name] are clear with no open lesions noted bilateral.  Nails are 3-41mm thick, with yellowish/brown discoloration, subungual debris and distal onycholysis x10.  There is pain with compression of nails x10.  There are hyperkeratotic lesions noted right submet 3.     Latest Ref Rng & Units 12/24/2021    9:11 AM  Hemoglobin A1C  Hemoglobin-A1c 4.0 - 5.6 % 5.8    Patient qualifies for at-risk foot care because of diabetes with PVD and peripheral neuropathy.  Assessment/Plan: 1. Dermatophytosis of nail   2. Type II diabetes mellitus with peripheral circulatory disorder (HCC)   3. Callus of foot    Mycotic nails x10 were sharply debrided with sterile nail nippers and power debriding burr to decrease bulk and length.  Hyperkeratotic lesion x 1 was shaved with #312 blade.   Return in about 3 months (around 01/05/2023) for [facility].   Clerance Lav, DPM, FACFAS [facility]     [address] [place], [place] [address]                Office [phone]   Fax [phone]

## 2022-10-11 DIAGNOSIS — Z940 Kidney transplant status: Secondary | ICD-10-CM | POA: Diagnosis not present

## 2022-10-19 DIAGNOSIS — C50919 Malignant neoplasm of unspecified site of unspecified female breast: Secondary | ICD-10-CM | POA: Diagnosis not present

## 2022-10-19 DIAGNOSIS — Z940 Kidney transplant status: Secondary | ICD-10-CM | POA: Diagnosis not present

## 2022-10-19 DIAGNOSIS — E212 Other hyperparathyroidism: Secondary | ICD-10-CM | POA: Diagnosis not present

## 2022-10-19 DIAGNOSIS — I10 Essential (primary) hypertension: Secondary | ICD-10-CM | POA: Diagnosis not present

## 2022-10-19 DIAGNOSIS — E876 Hypokalemia: Secondary | ICD-10-CM | POA: Diagnosis not present

## 2022-10-19 DIAGNOSIS — N058 Unspecified nephritic syndrome with other morphologic changes: Secondary | ICD-10-CM | POA: Diagnosis not present

## 2022-10-19 DIAGNOSIS — E785 Hyperlipidemia, unspecified: Secondary | ICD-10-CM | POA: Diagnosis not present

## 2022-10-19 DIAGNOSIS — E139 Other specified diabetes mellitus without complications: Secondary | ICD-10-CM | POA: Diagnosis not present

## 2022-10-26 DIAGNOSIS — L821 Other seborrheic keratosis: Secondary | ICD-10-CM | POA: Diagnosis not present

## 2022-10-26 DIAGNOSIS — D2239 Melanocytic nevi of other parts of face: Secondary | ICD-10-CM | POA: Diagnosis not present

## 2022-10-26 DIAGNOSIS — D225 Melanocytic nevi of trunk: Secondary | ICD-10-CM | POA: Diagnosis not present

## 2022-10-26 DIAGNOSIS — L814 Other melanin hyperpigmentation: Secondary | ICD-10-CM | POA: Diagnosis not present

## 2022-10-26 DIAGNOSIS — D485 Neoplasm of uncertain behavior of skin: Secondary | ICD-10-CM | POA: Diagnosis not present

## 2022-10-28 ENCOUNTER — Ambulatory Visit: Payer: Medicare Other | Admitting: Internal Medicine

## 2022-10-28 ENCOUNTER — Encounter: Payer: Self-pay | Admitting: Internal Medicine

## 2022-10-28 VITALS — BP 120/68 | HR 86 | Ht 65.0 in | Wt 162.4 lb

## 2022-10-28 DIAGNOSIS — E785 Hyperlipidemia, unspecified: Secondary | ICD-10-CM

## 2022-10-28 DIAGNOSIS — N1831 Chronic kidney disease, stage 3a: Secondary | ICD-10-CM | POA: Diagnosis not present

## 2022-10-28 DIAGNOSIS — Z7984 Long term (current) use of oral hypoglycemic drugs: Secondary | ICD-10-CM | POA: Diagnosis not present

## 2022-10-28 DIAGNOSIS — E1122 Type 2 diabetes mellitus with diabetic chronic kidney disease: Secondary | ICD-10-CM

## 2022-10-28 DIAGNOSIS — E119 Type 2 diabetes mellitus without complications: Secondary | ICD-10-CM | POA: Diagnosis not present

## 2022-10-28 MED ORDER — RYBELSUS 3 MG PO TABS
1.0000 | ORAL_TABLET | Freq: Every day | ORAL | 3 refills | Status: DC
Start: 2022-10-28 — End: 2023-11-24

## 2022-10-28 NOTE — Progress Notes (Signed)
Patient ID: Erika Freeman, female   DOB: 1960-06-01, 62 y.o.   MRN: [id number]  HPI: PURPOSE Erika Freeman is a 62 y.o.-year-old female, returning for follow-up for DM2, dx in 2020, non-insulin-dependent, controlled, without long-term complications. Pt. previously saw Dr. Everardo All, but last visit with me 1 year ago.  She is here with her husband.  Interim history: She has chronically increased urination, no blurry vision, nausea, chest pain.  Reviewed HbA1c:  Lab Results  Component Value Date   HGBA1C 5.8 (A) 12/24/2021   HGBA1C 5.7 (A) 06/18/2021   HGBA1C 5.6 12/11/2020   HGBA1C 5.5 06/05/2020   HGBA1C 5.5 01/31/2020   HGBA1C 5.7 (A) 09/20/2019   HGBA1C 7.7 (A) 06/18/2019  02/24/2021: HbA1c 6.0%.  Pt is on a regimen of: - Rybelsus 3 mg daily in am  Pt checks her sugars 2-3x a month and they are: - am: 85-94 >> 80s-90s, 110 - 2h after b'fast: n/c - before lunch: n/c - 2h after lunch: 122 >> 190 - before dinner: n/c - 2h after dinner: n/c - bedtime: n/c - nighttime: n/c Lowest sugar was 70s >> 80; she has hypoglycemia awareness at 70.  Highest sugar was 125 >> 190 x1  Glucometer: AccuCheck [employer]  - + CKD - seeing Dr. Glenna Fellows (nephrology), last BUN/creatinine:    Lab Results  Component Value Date   BUN 17 12/30/2021   BUN 20 11/16/2020   CREATININE 1.13 (H) 12/30/2021   CREATININE 1.12 (H) 11/16/2020   -+ HL; last set of lipids:  No results found for: "CHOL", "HDL", "LDLCALC", "LDLDIRECT", "TRIG", "CHOLHDL" On Lipitor 10 mg daily.  - last eye exam was in 2024. No DR or cataract reportedly. She wears progressive lenses.  - no numbness and tingling in her feet.  Last foot exam-by Dr. Eloy End 06/23/2022.  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  She previously had hyperparathyroidism and had to have parathyroidectomy.  She also has a history of HTN, anemia, GERD, hypomagnesemia. She is on prednisone 5 mg daily.  ROS: + see  HPI  Past Medical History:  Diagnosis Date   Anemia    CKD (chronic kidney disease), stage III (HCC)    nephologist-- dr Lillia Abed [facility])  and transplant clinic [facility]   Diabetes mellitus without complication (HCC)    Diverticulosis of colon    GERD (gastroesophageal reflux disease)    03-21-2019 -- per pt w/ spicy foods and at night time occasionally,  chews watermelon gum and pt states it resolves   Heart murmur    03-21-2019  per pt was told had murmur some years ago, pt is asymptomatic (last echo in [facility] 05-31-2016  trivial regurg MR and TR)   History of end stage renal disease    secondary to FSGS with PD until kidney transplant 12/ 2015   History of radiation therapy 12/20/16  to 01/17/17   left breast 50.4 gy in 28 fractions   Hypercalcemia    pt had recent hospital admision @[facility]  (in care everywhere) for acute kidney injury due to hypercalcemia and hypomagnesium,  resolved with medication, lov w/ transplant clinic @[facility]  02-14-2019  calcium  normal per lab (care everywhere)    Hypertension    followed by transplant clinic @[facility] ---  (pt had stress echo 07-11-2013, negative for ischemia, in care everywhere)   Hypokalemia    pt hospital admission    Hypomagnesemia    Immunosuppression (HCC)    due  to kidney transplant 02-12-2014   Kidney transplant status, living unrelated donor followed by transplant clinic @[facility] -- Dr Dorris Carnes. [id number]   02-12-2014  @[facility]    Malignant neoplasm of breast (female) [facility]) oncologist--- dr Darnelle Catalan (lov in epic no recurrence 01-18-2019)   dx 03/ 2018  IDC Stage IIB ---  completed chemotherapy 10-11-2016;  s/p left breast lumpectomy with node dissection 11-14-2016;  completed radiation 01-17-2017   Personal history of chemotherapy    left breast cancer--  06-01-2016  to 10-11-2016   Personal history of radiation therapy    Primary hyperparathyroidism [facility])    12/ 2020 w/ hypercalcimia  (pt has hx of hyperparathyoidism s/p right  inferior parathyroidectomy 11-27-2006 @[facility] ;  per path adenoma)   Uterine fibroid    Past Surgical History:  Procedure Laterality Date   BREAST BIOPSY     BREAST LUMPECTOMY Left    2018   BREAST LUMPECTOMY WITH RADIOACTIVE SEED AND SENTINEL LYMPH NODE BIOPSY Left 11/14/2016   Procedure: LEFT BREAST RADIOACTIVE SEED BRACKETED LUMPECTOMY, LEFT AXILLARY SENTINEL LYMPH NODE BIOPSY WITH BLUE DYE INJECTION;  Surgeon: Emelia Loron, MD;  Location: [facility] OR;  Service: General;  Laterality: Left;   BREAST SURGERY  2012   left - fibroadenoma   CAPD INSERTION  01/10/2012   Procedure: LAPAROSCOPIC INSERTION CONTINUOUS AMBULATORY PERITONEAL DIALYSIS  (CAPD) CATHETER;  Surgeon: Ardeth Sportsman, MD;  Location: [facility] OR;  Service: General;  Laterality: N/A;  LAPAROSCOPIC CAPD CATHETER PLACEMENT OMENTOPEXY   CESAREAN SECTION  1995   COLONOSCOPY     DILATION AND CURETTAGE OF UTERUS  2000   KIDNEY TRANSPLANT     PARATHYROIDECTOMY Right 11-27-2006   dr gerkin @[facility]    right inferior  (adenoma)   PARATHYROIDECTOMY Right 03/25/2019   Procedure: RIGHT SUPERIOR PARATHYROIDECTOMY;  Surgeon: Darnell Level, MD;  Location: [facility] [place];  Service: General;  Laterality: Right;   PORT-A-CATH REMOVAL Right 11/14/2016   Procedure: REMOVAL PORT-A-CATH;  Surgeon: Emelia Loron, MD;  Location: [facility] OR;  Service: General;  Laterality: Right;   PORTACATH PLACEMENT Right 05/24/2016   Procedure: INSERTION PORT-A-CATH WITH [place];  Surgeon: Emelia Loron, MD;  Location: [facility] OR;  Service: General;  Laterality: Right;   RENAL BIOPSY  2011   TUBAL LIGATION     UMBILICAL HERNIA REPAIR  01/10/2012   Procedure: HERNIA REPAIR UMBILICAL ADULT;  Surgeon: Ardeth Sportsman, MD;  Location: [facility] OR;  Service: General;  Laterality: N/A;   Social History   Socioeconomic History   Marital status: Married    Spouse name: Not on file   Number of children: 3   Years of education: Not on file   Highest education level: Not on file   Occupational History   Not on file  Tobacco Use   Smoking status: Never   Smokeless tobacco: Never  Vaping Use   Vaping status: Never Used  Substance and Sexual Activity   Alcohol use: No   Drug use: Never   Sexual activity: Not on file  Other Topics Concern   Not on file  Social History Narrative   Not on file   Social Determinants of Health   Financial Resource Strain: Not on file  Food Insecurity: No Food Insecurity (09/20/2021)   Hunger Vital Sign    Worried About Running Out of Food in the Last Year: Never true    Ran Out of Food in the Last Year: Never true  Transportation Needs: No Transportation Needs (09/20/2021)   PRAPARE - Transportation  Lack of Transportation (Medical): No    Lack of Transportation (Non-Medical): No  Physical Activity: Not on file  Stress: Not on file  Social Connections: Not on file  Intimate Partner Violence: Not on file   Current Outpatient Medications on File Prior to Visit  Medication Sig Dispense Refill   amLODipine (NORVASC) 10 MG tablet 1 tablet     anastrozole (ARIMIDEX) 1 MG tablet      anastrozole (ARIMIDEX) 1 MG tablet Take 1 tablet (1 mg total) by mouth daily. 90 tablet 0   aspirin 81 MG tablet Take 81 mg by mouth every morning.      atorvastatin (LIPITOR) 10 MG tablet 1 tablet     cholecalciferol (VITAMIN D3) 25 MCG (1000 UNIT) tablet Take 1,000 Units by mouth daily.     FLOWFLEX COVID-19 AG HOME TEST KIT See admin instructions.     glucose blood (ACCU-CHEK AVIVA PLUS) test strip Use to check blood sugar once a day. 100 each 12   Magnesium Oxide (MAG-OXIDE) 200 MG TABS See admin instructions.     potassium chloride SA (KLOR-CON) 20 MEQ tablet Take 40 mEq by mouth 2 (two) times daily.     predniSONE (DELTASONE) 5 MG tablet Take 5 mg by mouth every morning.      promethazine (PHENERGAN) 12.5 MG tablet 1 tablet as needed     RYBELSUS 3 MG TABS TAKE 1 TABLET BY MOUTH DAILY 90 tablet 1   tacrolimus (PROGRAF) 1 MG capsule See  admin instructions.     No current facility-administered medications on file prior to visit.   No Known Allergies Family History  Problem Relation Age of Onset   Cancer Maternal Uncle        lung   Diabetes Maternal Grandmother    Cancer Cousin        colon   Colon cancer Neg Hx    Colon polyps Neg Hx    Esophageal cancer Neg Hx    Stomach cancer Neg Hx    Rectal cancer Neg Hx    Breast cancer Neg Hx    PE: LMP 04/27/2016 (Exact Date)  Wt Readings from Last 3 Encounters:  12/30/21 153 lb 12.8 oz (69.8 kg)  12/24/21 154 lb 12.8 oz (70.2 kg)  03/25/21 155 lb (70.3 kg)   Constitutional: normal weight, in NAD Eyes: no exophthalmos ENT: no thyromegaly, no cervical lymphadenopathy, 2 parathyroidectomy scars healed Cardiovascular: RRR, No MRG Respiratory: CTA B Musculoskeletal: no deformities Skin: no rashes Neurological: no tremor with outstretched hands  ASSESSMENT: 1. DM2, non-insulin-dependent, controlled, without long-term complications -She does have a history of end stage renal disease 2/2 FSGS, s/p renal transplant in 2015 -She developed diabetes after the transplant, but not immediately after, to qualify for NODAT (new onset diabetes after transplant)  2. HL  PLAN:  1. Patient with history of longstanding, controlled diabetes developed after her renal transplant and subsequent breast cancer diagnosis, on daily oral low-dose GLP-1 receptor agonist, with excellent control.  HbA1c before last visit was 5.8%.  At that time, she was not checking sugars frequently or later in the day, and I advised her to rotate the blood sugar checks throughout the day.  Sugars checked in the morning were at goal.  She tolerated Rybelsus well so we discussed about continuing the 3 mg daily dose.  I did recommend to see PCP for this problem but she wanted to continue to see me.  I am seeing her on a yearly basis. -Had another  HbA1c obtained 09/23/2022 by PCP and this was even lower, at  5.6% -At today's visit, sugars are at goal with only 1 exception, when blood sugars increased to 190s after lunch.  She is not sure why this happened and it only happened once since last visit.  For now, I advised her to continue to keep an eye on her blood sugars-to also check some sugars later in the day, but no changes needed in her regimen.  I refilled her Rybelsus for a year. - I suggested to:  Patient Instructions  Please continue Rybelsus 3 mg daily in am.  Please return in 1 year.  - advised to check sugars at different times of the day - 1x a day, rotating check times - advised for yearly eye exams >> she is UTD - return to clinic in 1 year  2. HL - Reviewed latest lipid panel from 08/2022: LDL was above target, triglycerides also high, HDL slightly low No results found for: "CHOL", "HDL", "LDLCALC", "LDLDIRECT", "TRIG", "CHOLHDL" - Continues Lipitor 10 mg daily without side effects  Carlus Pavlov, MD PhD [facility]

## 2022-10-28 NOTE — Patient Instructions (Signed)
Please continue Rybelsus 3 mg daily in am.  Please return in 1 year.

## 2022-11-02 ENCOUNTER — Ambulatory Visit: Admission: RE | Admit: 2022-11-02 | Payer: Medicare Other | Source: Ambulatory Visit

## 2022-11-02 DIAGNOSIS — Z1231 Encounter for screening mammogram for malignant neoplasm of breast: Secondary | ICD-10-CM | POA: Diagnosis not present

## 2022-11-02 DIAGNOSIS — C50011 Malignant neoplasm of nipple and areola, right female breast: Secondary | ICD-10-CM

## 2022-11-10 DIAGNOSIS — R319 Hematuria, unspecified: Secondary | ICD-10-CM | POA: Diagnosis not present

## 2022-12-28 ENCOUNTER — Ambulatory Visit: Payer: Medicare Other | Admitting: Internal Medicine

## 2023-01-03 ENCOUNTER — Encounter: Payer: Self-pay | Admitting: Hematology and Oncology

## 2023-01-03 ENCOUNTER — Other Ambulatory Visit: Payer: Self-pay

## 2023-01-03 ENCOUNTER — Inpatient Hospital Stay: Payer: Medicare Other

## 2023-01-03 ENCOUNTER — Inpatient Hospital Stay: Payer: Medicare Other | Attending: Hematology and Oncology | Admitting: Hematology and Oncology

## 2023-01-03 VITALS — BP 137/83 | HR 81 | Temp 97.3°F | Resp 16 | Wt 165.1 lb

## 2023-01-03 DIAGNOSIS — C50011 Malignant neoplasm of nipple and areola, right female breast: Secondary | ICD-10-CM

## 2023-01-03 DIAGNOSIS — Z853 Personal history of malignant neoplasm of breast: Secondary | ICD-10-CM | POA: Insufficient documentation

## 2023-01-03 DIAGNOSIS — Z08 Encounter for follow-up examination after completed treatment for malignant neoplasm: Secondary | ICD-10-CM | POA: Insufficient documentation

## 2023-01-03 NOTE — Progress Notes (Signed)
[facility]  Telephone:([phone]) [phone] Fax:(336) [phone]     ID: MAHDIYA MOSSBERG DOB: [birth year]  MR#: [id number]  ZHY#:[id number]  Patient Care Team: Collene Mares, PA as PCP - General (Internal Medicine) Lauris Poag, MD as Consulting Physician (Nephrology) Freddy Finner, MD (Inactive) as Consulting Physician (Obstetrics and Gynecology) Emelia Loron, MD as Consulting Physician (General Surgery) Magrinat, Valentino Hue, MD (Inactive) as Consulting Physician (Oncology) Antony Blackbird, MD as Consulting Physician (Radiation Oncology) Jefm Miles, MD as Referring Physician (Surgery) Knox Saliva, DO (Internal Medicine)  CHIEF COMPLAINT: Estrogen receptor positive breast cancer  CURRENT TREATMENT: anastrozole   INTERVAL HISTORY:   Erika Freeman returns today for follow-up of her estrogen receptor positive breast cancer. She is accompanied by her husband. She continues on anastrozole daily with good tolerance. Since her last visit here, she has had all good visits with her doctors.  She denies any new health complaints. No changes noticed in the breast.  No change in breathing, bowel habits or urinary habits.  Rest of the pertinent 10 point ROS reviewed and negative   COVID 19 VACCINATION STATUS: Status post Pfizer x4 as of September 2022   BREAST CANCER HISTORY: From the original intake note:  "Erika Freeman" herself noted a mass in her left breast. She ignored it because she thought it was related to menstruation. However as it persisted through 1. She brought it to Dr. Donnetta Hail attention and on 05/10/2016 she underwent left diagnostic mammography with tomography and [facility]. The breast density was category C. In the left breast lower central area there was a new circumscribed mass. This was firm and fixed in the lower inner left breast at middle depth. Ultrasonography confirmed a 2.6 cm hypoechoic irregular mass at the  6:30 o'clock radiant 3 cm from the nipple. There was also a 0.5 cm nodule 1.4 cm medial to the mass just described.  Biopsy of the left breast mass in question 05/10/2016 showed([id number]) invasive ductal carcinoma, grade 3, estrogen receptor 70% positive with weak staining intensity, progesterone receptor negative, with no HER-2 amplification, the signals ratio being 1.52 and the number per cell 2.35. The proliferation marker was 90%.  Her subsequent history is as detailed below  OF NOTE: The patient has a history of focal segmental glomerular sclerosis with end-stage renal disease and is status post unrelated donor renal transplant 04/15/2013 as part of appeared donor exchange with her husband donating one kidney, the other harvested from a 61 year old [place] man. She continues on immunosuppression with mycophenolate and tacrolimus.   PAST MEDICAL HISTORY: Past Medical History:  Diagnosis Date   Anemia    CKD (chronic kidney disease), stage III (HCC)    nephologist-- dr Lillia Abed [facility])  and transplant clinic [facility]   Diabetes mellitus without complication (HCC)    Diverticulosis of colon    GERD (gastroesophageal reflux disease)    03-21-2019 -- per pt w/ spicy foods and at night time occasionally,  chews watermelon gum and pt states it resolves   Heart murmur    03-21-2019  per pt was told had murmur some years ago, pt is asymptomatic (last echo in [facility] 05-31-2016  trivial regurg MR and TR)   History of end stage renal disease    secondary to FSGS with PD until kidney transplant 12/ 2015   History of radiation therapy 12/20/16  to 01/17/17   left breast 50.4 gy in 28 fractions   Hypercalcemia  pt had recent hospital admision @[facility]  (in care everywhere) for acute kidney injury due to hypercalcemia and hypomagnesium,  resolved with medication, lov w/ transplant clinic @[facility]  02-14-2019  calcium  normal per lab (care everywhere)    Hypertension    followed by  transplant clinic @[facility] ---  (pt had stress echo 07-11-2013, negative for ischemia, in care everywhere)   Hypokalemia    pt hospital admission    Hypomagnesemia    Immunosuppression (HCC)    due to kidney transplant 02-12-2014   Kidney transplant status, living unrelated donor followed by transplant clinic [facility] -- Dr Dorris Carnes. [id number]   02-12-2014  @[facility]    Malignant neoplasm of breast (female) [facility]) oncologist--- dr Darnelle Catalan (lov in epic no recurrence 01-18-2019)   dx 03/ 2018  IDC Stage IIB ---  completed chemotherapy 10-11-2016;  s/p left breast lumpectomy with node dissection 11-14-2016;  completed radiation 01-17-2017   Personal history of chemotherapy    left breast cancer--  06-01-2016  to 10-11-2016   Personal history of radiation therapy    Primary hyperparathyroidism [facility])    12/ 2020 w/ hypercalcimia  (pt has hx of hyperparathyoidism s/p right inferior parathyroidectomy 11-27-2006 @[facility] ;  per path adenoma)   Uterine fibroid     PAST SURGICAL HISTORY: Past Surgical History:  Procedure Laterality Date   BREAST BIOPSY     BREAST LUMPECTOMY Left    2018   BREAST LUMPECTOMY WITH RADIOACTIVE SEED AND SENTINEL LYMPH NODE BIOPSY Left 11/14/2016   Procedure: LEFT BREAST RADIOACTIVE SEED BRACKETED LUMPECTOMY, LEFT AXILLARY SENTINEL LYMPH NODE BIOPSY WITH BLUE DYE INJECTION;  Surgeon: Emelia Loron, MD;  Location: [facility] OR;  Service: General;  Laterality: Left;   BREAST SURGERY  2012   left - fibroadenoma   CAPD INSERTION  01/10/2012   Procedure: LAPAROSCOPIC INSERTION CONTINUOUS AMBULATORY PERITONEAL DIALYSIS  (CAPD) CATHETER;  Surgeon: Ardeth Sportsman, MD;  Location: [facility] OR;  Service: General;  Laterality: N/A;  LAPAROSCOPIC CAPD CATHETER PLACEMENT OMENTOPEXY   CESAREAN SECTION  1995   COLONOSCOPY     DILATION AND CURETTAGE OF UTERUS  2000   KIDNEY TRANSPLANT     PARATHYROIDECTOMY Right 11-27-2006   dr gerkin @[facility]    right inferior  (adenoma)   PARATHYROIDECTOMY Right 03/25/2019    Procedure: RIGHT SUPERIOR PARATHYROIDECTOMY;  Surgeon: Darnell Level, MD;  Location: [facility];  Service: General;  Laterality: Right;   PORT-A-CATH REMOVAL Right 11/14/2016   Procedure: REMOVAL PORT-A-CATH;  Surgeon: Emelia Loron, MD;  Location: [facility] OR;  Service: General;  Laterality: Right;   PORTACATH PLACEMENT Right 05/24/2016   Procedure: INSERTION PORT-A-CATH WITH [place];  Surgeon: Emelia Loron, MD;  Location: [facility] OR;  Service: General;  Laterality: Right;   RENAL BIOPSY  2011   TUBAL LIGATION     UMBILICAL HERNIA REPAIR  01/10/2012   Procedure: HERNIA REPAIR UMBILICAL ADULT;  Surgeon: Ardeth Sportsman, MD;  Location: [facility] OR;  Service: General;  Laterality: N/A;    FAMILY HISTORY Family History  Problem Relation Age of Onset   Cancer Maternal Uncle        lung   Diabetes Maternal Grandmother    Cancer Cousin        colon   Colon cancer Neg Hx    Colon polyps Neg Hx    Esophageal cancer Neg Hx    Stomach cancer Neg Hx    Rectal cancer Neg Hx    Breast cancer Neg Hx   The patient has very little information regarding her father.  Her mother is living, 34 years old as of March 2018. The patient had one full brother, diagnosed with prostate cancer in his 68s. The patient has 2 half-brothers and 1 half-sister. There is no history of breast or ovarian cancer in the family to the patient's knowledge    GYNECOLOGIC HISTORY:  Patient's last menstrual period was 04/27/2016 (exact date).  Menarche age 30, first live birth age 50, the patient is GX P3. Her periods are regular, the last 5 or 6 days, with no heavy days. 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 Her husband Erika Freeman works at home [employer]. Son Erika Freeman lives in [place] and work as Production designer, theatre/television/film of a Print production planner. Son Erika Freeman lives in [place] and is a Armed forces training and education officer. Son Erika Freeman lives in [place] and  is studying based trombone. The patient has 2 grandchildren. She attends a local nondenominational [employer]     ADVANCED DIRECTIVES: In the absence of any documents to the contrary her husband is her healthcare     HEALTH MAINTENANCE: Social History   Tobacco Use   Smoking status: Never   Smokeless tobacco: Never  Vaping Use   Vaping status: Never Used  Substance Use Topics   Alcohol use: No   Drug use: Never     Colonoscopy:  PAP:  Bone density:   No Known Allergies  Current Outpatient Medications  Medication Sig Dispense Refill   amLODipine (NORVASC) 10 MG tablet 1 tablet     aspirin 81 MG tablet Take 81 mg by mouth every morning.      atorvastatin (LIPITOR) 10 MG tablet 1 tablet     cholecalciferol (VITAMIN D3) 25 MCG (1000 UNIT) tablet Take 1,000 Units by mouth daily.     FLOWFLEX COVID-19 AG HOME TEST KIT See admin instructions.     glucose blood (ACCU-CHEK AVIVA PLUS) test strip Use to check blood sugar once a day. 100 each 12   Magnesium Oxide (MAG-OXIDE) 200 MG TABS See admin instructions.     potassium chloride SA (KLOR-CON) 20 MEQ tablet Take 40 mEq by mouth 2 (two) times daily.     predniSONE (DELTASONE) 5 MG tablet Take 5 mg by mouth every morning.      Semaglutide (RYBELSUS) 3 MG TABS Take 1 tablet (3 mg total) by mouth daily. 90 tablet 3   tacrolimus (PROGRAF) 1 MG capsule See admin instructions.     No current facility-administered medications for this visit.    OBJECTIVE:  There were no vitals filed for this visit.    There is no height or weight on file to calculate BMI.  There were no vitals filed for this visit.  There were no vitals filed for this visit.  ECOG FS:1 - Symptomatic but completely ambulatory   Physical Exam Constitutional:      Appearance: Normal appearance.  Chest:     Comments: Bilateral breasts inspected.  No palpable masses or regional adenopathy. Musculoskeletal:        General: Swelling (Bilateral ankle swelling  1+, symmetrical) present. Normal range of motion.     Cervical back: Normal range of motion and neck supple. No rigidity.  Neurological:     Mental Status: She is alert.  Psychiatric:        Mood and Affect: Mood normal.     LAB RESULTS:  CMP     Component Value Date/Time   NA 142  12/30/2021 1331   NA 142 02/14/2017 1012   K 3.7 12/30/2021 1331   K 2.8 (LL) 02/14/2017 1012   CL 106 12/30/2021 1331   CO2 31 12/30/2021 1331   CO2 30 (H) 02/14/2017 1012   GLUCOSE 143 (H) 12/30/2021 1331   GLUCOSE 104 02/14/2017 1012   BUN 17 12/30/2021 1331   BUN 14.1 02/14/2017 1012   CREATININE 1.13 (H) 12/30/2021 1331   CREATININE 1.2 (H) 02/14/2017 1012   CALCIUM 10.4 (H) 12/30/2021 1331   CALCIUM 9.6 02/14/2017 1012   PROT 7.6 12/30/2021 1331   PROT 7.8 02/14/2017 1012   ALBUMIN 4.4 12/30/2021 1331   ALBUMIN 4.4 02/14/2017 1012   AST 18 12/30/2021 1331   AST 15 02/14/2017 1012   ALT 17 12/30/2021 1331   ALT 15 02/14/2017 1012   ALKPHOS 63 12/30/2021 1331   ALKPHOS 72 02/14/2017 1012   BILITOT 0.6 12/30/2021 1331   BILITOT 0.53 02/14/2017 1012   GFRNONAA 56 (L) 12/30/2021 1331   GFRAA 54 (L) 11/13/2019 1408    No results found for: "TOTALPROTELP", "ALBUMINELP", "A1GS", "A2GS", "BETS", "BETA2SER", "GAMS", "MSPIKE", "SPEI"  No results found for: "KPAFRELGTCHN", "LAMBDASER", "KAPLAMBRATIO"  Lab Results  Component Value Date   WBC 12.6 (H) 12/30/2021   NEUTROABS 10.2 (H) 12/30/2021   HGB 13.2 12/30/2021   HCT 40.2 12/30/2021   MCV 87.2 12/30/2021   PLT 209 12/30/2021      Chemistry      Component Value Date/Time   NA 142 12/30/2021 1331   NA 142 02/14/2017 1012   K 3.7 12/30/2021 1331   K 2.8 (LL) 02/14/2017 1012   CL 106 12/30/2021 1331   CO2 31 12/30/2021 1331   CO2 30 (H) 02/14/2017 1012   BUN 17 12/30/2021 1331   BUN 14.1 02/14/2017 1012   CREATININE 1.13 (H) 12/30/2021 1331   CREATININE 1.2 (H) 02/14/2017 1012      Component Value Date/Time   CALCIUM 10.4  (H) 12/30/2021 1331   CALCIUM 9.6 02/14/2017 1012   ALKPHOS 63 12/30/2021 1331   ALKPHOS 72 02/14/2017 1012   AST 18 12/30/2021 1331   AST 15 02/14/2017 1012   ALT 17 12/30/2021 1331   ALT 15 02/14/2017 1012   BILITOT 0.6 12/30/2021 1331   BILITOT 0.53 02/14/2017 1012       No results found for: "LABCA2"  No components found for: "[id number]"  No results for input(s): "INR" in the last 168 hours.  Urinalysis    Component Value Date/Time   COLORURINE YELLOW 11/24/2006 1539   APPEARANCEUR CLEAR 11/24/2006 1539   LABSPEC 1.018 11/24/2006 1539   PHURINE 6.0 11/24/2006 1539   GLUCOSEU NEGATIVE 11/24/2006 1539   HGBUR SMALL (A) 11/24/2006 1539   BILIRUBINUR NEGATIVE 11/24/2006 1539   KETONESUR NEGATIVE 11/24/2006 1539   PROTEINUR 100 (A) 11/24/2006 1539   UROBILINOGEN 1.0 11/24/2006 1539   NITRITE NEGATIVE 11/24/2006 1539   LEUKOCYTESUR MODERATE (A) 11/24/2006 1539    STUDIES: No results found.    ELIGIBLE FOR AVAILABLE RESEARCH PROTOCOL: no  ASSESSMENT: 62 y.o. [place], [place] woman status post left breast lower inner quadrant biopsy 05/10/2016 for a clinical  T2 N0, prognostic stage IIB invaside ductal carcinoma, grade 3, weekly estrogen receptor positive, progesterone receptor negative, with no HER-2 amplification, and the MIB-1 at 90%  (a) biopsy of the 0.6 mm satellite at the 6:30 o'clock radiant in the left breast 06/02/2016 showed invasive ductal carcinoma, grade 3, estrogen receptor 20% positive, with weak staining intensity,  progesterone receptor negative with no HER-2 amplification, and an MIB-1 of 70% (identical to larger mass)   (1) neoadjuvant chemotherapy consisting of doxorubicin and cyclophosphamide in dose dense fashion 4 started 06/01/2016, completed 07/12/2016  followed by Paclitaxel weekly 12 started 07/26/2016, completed 10/11/2016  (2) definitive surgery 11/14/2016 found a residual pT1a pN0 area of invasive ductal carcinoma (0.2 mm), with negative  margins.  (3) adjuvant radiation 12/20/16-01/17/17  Site/dose:   Left Breast, 50.4 Gy total delivered in 28 fractions   (4) started anastrozole 02/28/2017  (a) left heel DEXA scan 11/24/2004 was normal  (b) renal feels any additional radiation may be detrimental so bone density not updated  (C) repeat bone density 12/17/2019 showed a T score of -0.1             (D) completed 5 yrs.  (5) hypercalcemia with worsening renal function  (a) denosumab 60 mg and IV fluids given 08/03/2018  (b) bone scan 08/10/2018 shows no evidence of metastatic disease.   PLAN:  Ms. Kristain is doing very well today.  She arrived to the appointment today with her husband.  Since her last visit here, she has been doing remarkably well.  She completed 5 years of antiestrogen therapy.  Her most recent mammogram was unremarkable, no evidence of malignancy.  Bilateral breast inspected and palpated, no evidence of palpable masses or regional adenopathy. She tells me that she was already waiting for a while and the she was not arrived on a timely manner and she needs to leave.  Hence we have discussed about doing the labs with her nephrologist.  I recommended that she do a CBC and CMP.  She is agreeable to this plan. Return to clinic in 1 year or sooner as needed.  Total time spent: 20 min *Total Encounter Time as defined by the Centers for Medicare and Medicaid Services includes, in addition to the face-to-face time of a patient visit (documented in the note above) non-face-to-face time: obtaining and reviewing outside history, ordering and reviewing medications, tests or procedures, care coordination (communications with other health care professionals or caregivers) and documentation in the medical record.

## 2023-01-04 ENCOUNTER — Ambulatory Visit: Payer: Medicare Other | Admitting: Podiatry

## 2023-01-04 ENCOUNTER — Encounter: Payer: Self-pay | Admitting: Podiatry

## 2023-01-04 DIAGNOSIS — L84 Corns and callosities: Secondary | ICD-10-CM

## 2023-01-04 DIAGNOSIS — B351 Tinea unguium: Secondary | ICD-10-CM | POA: Diagnosis not present

## 2023-01-04 DIAGNOSIS — E1151 Type 2 diabetes mellitus with diabetic peripheral angiopathy without gangrene: Secondary | ICD-10-CM | POA: Diagnosis not present

## 2023-01-04 NOTE — Progress Notes (Signed)
Subjective:  Patient ID: Erika Freeman, female    DOB: [birth year],  MRN: [id number]  Erika Freeman presents to clinic today for:  Chief Complaint  Patient presents with   [facility]    Nails. Right sub met 3 callus. Morning glucose 98.   Patient presents with the above noted concerns.  PCP is [place], [place], [place].  Last seen 09/21/22  Past Medical History:  Diagnosis Date   Anemia    CKD (chronic kidney disease), stage III (HCC)    nephologist-- dr Lillia Abed [facility])  and transplant clinic [facility]   Diabetes mellitus without complication (HCC)    Diverticulosis of colon    GERD (gastroesophageal reflux disease)    03-21-2019 -- per pt w/ spicy foods and at night time occasionally,  chews watermelon gum and pt states it resolves   Heart murmur    03-21-2019  per pt was told had murmur some years ago, pt is asymptomatic (last echo in [facility] 05-31-2016  trivial regurg MR and TR)   History of end stage renal disease    secondary to FSGS with PD until kidney transplant 12/ 2015   History of radiation therapy 12/20/16  to 01/17/17   left breast 50.4 gy in 28 fractions   Hypercalcemia    pt had recent hospital admision @[facility]  (in care everywhere) for acute kidney injury due to hypercalcemia and hypomagnesium,  resolved with medication, lov w/ transplant clinic @[facility]  02-14-2019  calcium  normal per lab (care everywhere)    Hypertension    followed by transplant clinic @[facility] ---  (pt had stress echo 07-11-2013, negative for ischemia, in care everywhere)   Hypokalemia    pt hospital admission    Hypomagnesemia    Immunosuppression (HCC)    due to kidney transplant 02-12-2014   Kidney transplant status, living unrelated donor followed by transplant clinic [facility] -- Dr Dorris Carnes. [id number]   02-12-2014  @[facility]    Malignant neoplasm of breast (female) [facility]) oncologist--- dr Darnelle Catalan (lov in epic no recurrence 01-18-2019)   dx 03/ 2018  IDC Stage IIB ---  completed chemotherapy  10-11-2016;  s/p left breast lumpectomy with node dissection 11-14-2016;  completed radiation 01-17-2017   Personal history of chemotherapy    left breast cancer--  06-01-2016  to 10-11-2016   Personal history of radiation therapy    Primary hyperparathyroidism [facility])    12/ 2020 w/ hypercalcimia  (pt has hx of hyperparathyoidism s/p right inferior parathyroidectomy 11-27-2006 @[facility] ;  per path adenoma)   Uterine fibroid     No Known Allergies  Objective:  Erika Freeman is a pleasant 62 y.o. female in NAD. AAO x 3.  Vascular Examination: Patient has palpable DP pulse, absent PT pulse bilateral.  Delayed capillary refill bilateral toes.  Sparse digital hair bilateral.  Proximal to distal cooling WNL bilateral.    Dermatological Examination: [name] are clear with no open lesions noted bilateral.  Skin is shiny and atrophic bilateral.  Nails are 3-65mm thick, with yellowish/brown discoloration, subungual debris and distal onycholysis x10.  There is pain with compression of nails x10.  There are hyperkeratotic lesions noted right submet 3 .  Patient qualifies for at-risk foot care because of diabetes with PVD .  Assessment/Plan: 1. Dermatophytosis of nail   2. Callus of foot   3. Type II diabetes mellitus with peripheral circulatory disorder (HCC)     Mycotic nails x10 were sharply debrided with sterile nail nippers and power debriding  burr to decrease bulk and length.  Hyperkeratotic lesion x1 was shaved with #312 blade.   Return in about 3 months (around 04/06/2023) for [facility].   Clerance Lav, DPM, FACFAS [facility]     [address] [place], [place] [address]                Office [phone]  Fax [phone]

## 2023-01-11 DIAGNOSIS — E876 Hypokalemia: Secondary | ICD-10-CM | POA: Diagnosis not present

## 2023-01-11 DIAGNOSIS — E785 Hyperlipidemia, unspecified: Secondary | ICD-10-CM | POA: Diagnosis not present

## 2023-01-11 DIAGNOSIS — Z940 Kidney transplant status: Secondary | ICD-10-CM | POA: Diagnosis not present

## 2023-03-06 DIAGNOSIS — E1165 Type 2 diabetes mellitus with hyperglycemia: Secondary | ICD-10-CM | POA: Diagnosis not present

## 2023-04-06 ENCOUNTER — Ambulatory Visit: Payer: Medicare Other | Admitting: Podiatry

## 2023-04-06 DIAGNOSIS — M79674 Pain in right toe(s): Secondary | ICD-10-CM | POA: Diagnosis not present

## 2023-04-06 DIAGNOSIS — B351 Tinea unguium: Secondary | ICD-10-CM

## 2023-04-06 DIAGNOSIS — L84 Corns and callosities: Secondary | ICD-10-CM

## 2023-04-06 DIAGNOSIS — E1151 Type 2 diabetes mellitus with diabetic peripheral angiopathy without gangrene: Secondary | ICD-10-CM

## 2023-04-06 DIAGNOSIS — M79675 Pain in left toe(s): Secondary | ICD-10-CM | POA: Diagnosis not present

## 2023-04-06 NOTE — Progress Notes (Signed)
 Subjective:  Patient ID: Erika Freeman, female    DOB: November 03, 1960,  MRN: [id number]  Erika Freeman presents to clinic today for:  Chief Complaint  Patient presents with   [facility]    [facility]  with callous, no last A1c listed, FBS toady was 114, Takes ASA 81   Patient notes nails are thick and elongated, causing pain in shoe gear when ambulating.  Patient notes that she does not like her nails cut short but wants her hallux nails rounded off, as if she was getting a pedicure at a nail salon.  Patient also has painful calluses submet 2 bilateral  PCP is [place], [place], [place].  Last seen 03/06/2023  Past Medical History:  Diagnosis Date   Anemia    CKD (chronic kidney disease), stage III (HCC)    nephologist-- dr [facility]ita [facility])  and transplant clinic [facility]   Diabetes mellitus without complication (HCC)    Diverticulosis of colon    GERD (gastroesophageal reflux disease)    03-21-2019 -- per pt w/ spicy foods and at night time occasionally,  chews watermelon gum and pt states it resolves   Heart murmur    03-21-2019  per pt was told had murmur some years ago, pt is asymptomatic (last echo in epci 05-31-2016  trivial regurg MR and TR)   History of end stage renal disease    secondary to FSGS with PD until kidney transplant 12/ 2015   History of radiation therapy 12/20/16  to 01/17/17   left breast 50.4 gy in 28 fractions   Hypercalcemia    pt had recent hospital admision @[facility]  (in care everywhere) for acute kidney injury due to hypercalcemia and hypomagnesium,  resolved with medication, lov w/ transplant clinic @[facility]  02-14-2019  calcium   normal per lab (care everywhere)    Hypertension    followed by transplant clinic @[facility] ---  (pt had stress echo 07-11-2013, negative for ischemia, in care everywhere)   Hypokalemia    pt hospital admission    Hypomagnesemia    Immunosuppression (HCC)    due to kidney transplant 02-12-2014   Kidney transplant status, living  unrelated donor followed by transplant clinic [facility] -- Dr LOISE. [id number]   02-12-2014  @[facility]    Malignant neoplasm of breast (female) [facility]) oncologist--- dr layla (lov in epic no recurrence 01-18-2019)   dx 03/ 2018  IDC Stage IIB ---  completed chemotherapy 10-11-2016;  s/p left breast lumpectomy with node dissection 11-14-2016;  completed radiation 01-17-2017   Personal history of chemotherapy    left breast cancer--  06-01-2016  to 10-11-2016   Personal history of radiation therapy    Primary hyperparathyroidism [facility])    12/ 2020 w/ hypercalcimia  (pt has hx of hyperparathyoidism s/p right inferior parathyroidectomy 11-27-2006 @[facility] ;  per path adenoma)   Uterine fibroid     No Known Allergies  Objective:  Erika Freeman is a pleasant 63 y.o. female in NAD. AAO x 3.  Vascular Examination: Patient has palpable DP pulse, absent PT pulse bilateral.  Delayed capillary refill bilateral toes.  Sparse digital hair bilateral.  Proximal to distal cooling WNL bilateral.    Dermatological Examination: [name] are clear with no open lesions noted bilateral.  Skin is shiny and atrophic bilateral.  Nails are 3-67mm thick, with yellowish/brown discoloration, subungual debris and distal onycholysis x10.  There is pain with compression of nails x10.  There are hyperkeratotic lesions noted bilateral submet 2.  Patient qualifies for at-risk foot  care because of PVD.  Assessment/Plan: 1. Pain due to onychomycosis of toenails of both feet   2. Callus of foot   3. Type II diabetes mellitus with peripheral circulatory disorder (HCC)    Mycotic nails x10 were sharply debrided with sterile nail nippers and power debriding burr to decrease bulk and length.  Hyperkeratotic lesions bilateral submet 2 were shaved with #312 blade.  Return in about 3 months (around 07/04/2023) for [facility].   Erika Freeman, DPM, FACFAS [facility]     [address] [place], [place] [address]                Office [phone]  Fax [phone]

## 2023-04-18 DIAGNOSIS — Z940 Kidney transplant status: Secondary | ICD-10-CM | POA: Diagnosis not present

## 2023-04-18 DIAGNOSIS — E876 Hypokalemia: Secondary | ICD-10-CM | POA: Diagnosis not present

## 2023-04-27 DIAGNOSIS — I10 Essential (primary) hypertension: Secondary | ICD-10-CM | POA: Diagnosis not present

## 2023-04-27 DIAGNOSIS — Z940 Kidney transplant status: Secondary | ICD-10-CM | POA: Diagnosis not present

## 2023-04-27 DIAGNOSIS — E876 Hypokalemia: Secondary | ICD-10-CM | POA: Diagnosis not present

## 2023-04-27 DIAGNOSIS — N058 Unspecified nephritic syndrome with other morphologic changes: Secondary | ICD-10-CM | POA: Diagnosis not present

## 2023-04-27 DIAGNOSIS — E785 Hyperlipidemia, unspecified: Secondary | ICD-10-CM | POA: Diagnosis not present

## 2023-04-27 DIAGNOSIS — E212 Other hyperparathyroidism: Secondary | ICD-10-CM | POA: Diagnosis not present

## 2023-04-27 DIAGNOSIS — E139 Other specified diabetes mellitus without complications: Secondary | ICD-10-CM | POA: Diagnosis not present

## 2023-04-27 DIAGNOSIS — C50919 Malignant neoplasm of unspecified site of unspecified female breast: Secondary | ICD-10-CM | POA: Diagnosis not present

## 2023-05-12 DIAGNOSIS — E119 Type 2 diabetes mellitus without complications: Secondary | ICD-10-CM | POA: Diagnosis not present

## 2023-07-20 ENCOUNTER — Ambulatory Visit: Payer: Medicare Other | Admitting: Podiatry

## 2023-07-20 DIAGNOSIS — M79675 Pain in left toe(s): Secondary | ICD-10-CM | POA: Diagnosis not present

## 2023-07-20 DIAGNOSIS — M79674 Pain in right toe(s): Secondary | ICD-10-CM | POA: Diagnosis not present

## 2023-07-20 DIAGNOSIS — L84 Corns and callosities: Secondary | ICD-10-CM | POA: Diagnosis not present

## 2023-07-20 DIAGNOSIS — E1151 Type 2 diabetes mellitus with diabetic peripheral angiopathy without gangrene: Secondary | ICD-10-CM

## 2023-07-20 DIAGNOSIS — B351 Tinea unguium: Secondary | ICD-10-CM

## 2023-07-20 NOTE — Progress Notes (Unsigned)
 Subjective:  Patient ID: Erika Freeman, female    DOB: 1961-01-20,  MRN: [id number]  Erika Freeman presents to clinic today for:  Chief Complaint  Patient presents with   [facility]    [facility] with callous. Last A1c was 6.1 in April and takes ASA 81   Patient notes nails are thick and elongated, causing pain in shoe gear when ambulating.  She also has painful callus right submet 3.  PCP is Annabell Key, [place]  E, PA.  Last seen 03/06/2023  Past Medical History:  Diagnosis Date   Anemia    CKD (chronic kidney disease), stage III (HCC)    nephologist-- dr Heidi Llamas [facility])  and transplant clinic [facility]   Diabetes mellitus without complication (HCC)    Diverticulosis of colon    GERD (gastroesophageal reflux disease)    03-21-2019 -- per pt w/ spicy foods and at night time occasionally,  chews watermelon gum and pt states it resolves   Heart murmur    03-21-2019  per pt was told had murmur some years ago, pt is asymptomatic (last echo in epci 05-31-2016  trivial regurg MR and TR)   History of end stage renal disease    secondary to FSGS with PD until kidney transplant 12/ 2015   History of radiation therapy 12/20/16  to 01/17/17   left breast 50.4 gy in 28 fractions   Hypercalcemia    pt had recent hospital admision @[facility]  (in care everywhere) for acute kidney injury due to hypercalcemia and hypomagnesium,  resolved with medication, lov w/ transplant clinic @[facility]  02-14-2019  calcium   normal per lab (care everywhere)    Hypertension    followed by transplant clinic @[facility] ---  (pt had stress echo 07-11-2013, negative for ischemia, in care everywhere)   Hypokalemia    pt hospital admission    Hypomagnesemia    Immunosuppression (HCC)    due to kidney transplant 02-12-2014   Kidney transplant status, living unrelated donor followed by transplant clinic @[facility] -- Dr Neale Bale. [id number]   02-12-2014  @[facility]    Malignant neoplasm of breast (female) [facility]) oncologist--- dr Charolett Copes (lov  in epic no recurrence 01-18-2019)   dx 03/ 2018  IDC Stage IIB ---  completed chemotherapy 10-11-2016;  s/p left breast lumpectomy with node dissection 11-14-2016;  completed radiation 01-17-2017   Personal history of chemotherapy    left breast cancer--  06-01-2016  to 10-11-2016   Personal history of radiation therapy    Primary hyperparathyroidism [facility])    12/ 2020 w/ hypercalcimia  (pt has hx of hyperparathyoidism s/p right inferior parathyroidectomy 11-27-2006 @[facility] ;  per path adenoma)   Uterine fibroid    No Known Allergies  Objective:  Erika LYTTLE is a pleasant 63 y.o. female in NAD. AAO x 3.  Vascular Examination: Patient has palpable DP pulse, absent PT pulse bilateral.  Delayed capillary refill bilateral toes.  Sparse digital hair bilateral.  Proximal to distal cooling WNL bilateral.    Dermatological Examination: [name] are clear with no open lesions noted bilateral.  Skin is shiny and atrophic bilateral.  Nails are 3-64mm thick, with yellowish/brown discoloration, subungual debris and distal onycholysis x10.  There is pain with compression of nails x10.  There are hyperkeratotic lesions noted right submet 3.  Patient qualifies for at-risk foot care because of diabetes with PVD.  Assessment/Plan: 1. Pain due to onychomycosis of toenails of both feet   2. Callus of foot   3. Type II diabetes mellitus with peripheral  circulatory disorder (HCC)     Mycotic nails x10 were sharply debrided with sterile nail nippers and power debriding burr to decrease bulk and length.  Hyperkeratotic lesion x 1 was shaved with #312 blade.  Return in about 3 months (around 10/20/2023) for [facility].   Joe Murders, DPM, FACFAS [facility]     [address] [place], [place] [address]                Office [phone]  Fax [phone]

## 2023-08-03 ENCOUNTER — Other Ambulatory Visit (HOSPITAL_COMMUNITY): Payer: Self-pay | Admitting: Internal Medicine

## 2023-08-03 DIAGNOSIS — E785 Hyperlipidemia, unspecified: Secondary | ICD-10-CM

## 2023-09-26 ENCOUNTER — Other Ambulatory Visit: Payer: Self-pay | Admitting: Hematology and Oncology

## 2023-09-26 DIAGNOSIS — Z1231 Encounter for screening mammogram for malignant neoplasm of breast: Secondary | ICD-10-CM

## 2023-09-26 DIAGNOSIS — Z0000 Encounter for general adult medical examination without abnormal findings: Secondary | ICD-10-CM | POA: Diagnosis not present

## 2023-09-26 DIAGNOSIS — E119 Type 2 diabetes mellitus without complications: Secondary | ICD-10-CM | POA: Diagnosis not present

## 2023-09-26 DIAGNOSIS — K219 Gastro-esophageal reflux disease without esophagitis: Secondary | ICD-10-CM | POA: Diagnosis not present

## 2023-09-26 DIAGNOSIS — E785 Hyperlipidemia, unspecified: Secondary | ICD-10-CM | POA: Diagnosis not present

## 2023-09-26 DIAGNOSIS — I5189 Other ill-defined heart diseases: Secondary | ICD-10-CM | POA: Diagnosis not present

## 2023-09-26 DIAGNOSIS — D849 Immunodeficiency, unspecified: Secondary | ICD-10-CM | POA: Diagnosis not present

## 2023-09-26 DIAGNOSIS — E1165 Type 2 diabetes mellitus with hyperglycemia: Secondary | ICD-10-CM | POA: Diagnosis not present

## 2023-09-26 DIAGNOSIS — G4762 Sleep related leg cramps: Secondary | ICD-10-CM | POA: Diagnosis not present

## 2023-09-26 DIAGNOSIS — C50512 Malignant neoplasm of lower-outer quadrant of left female breast: Secondary | ICD-10-CM | POA: Diagnosis not present

## 2023-09-26 DIAGNOSIS — E210 Primary hyperparathyroidism: Secondary | ICD-10-CM | POA: Diagnosis not present

## 2023-09-26 DIAGNOSIS — Z940 Kidney transplant status: Secondary | ICD-10-CM | POA: Diagnosis not present

## 2023-09-26 DIAGNOSIS — I10 Essential (primary) hypertension: Secondary | ICD-10-CM | POA: Diagnosis not present

## 2023-10-13 DIAGNOSIS — Z940 Kidney transplant status: Secondary | ICD-10-CM | POA: Diagnosis not present

## 2023-10-13 DIAGNOSIS — E139 Other specified diabetes mellitus without complications: Secondary | ICD-10-CM | POA: Diagnosis not present

## 2023-10-19 ENCOUNTER — Ambulatory Visit (INDEPENDENT_AMBULATORY_CARE_PROVIDER_SITE_OTHER): Admitting: Podiatry

## 2023-10-19 DIAGNOSIS — B351 Tinea unguium: Secondary | ICD-10-CM

## 2023-10-19 DIAGNOSIS — M79675 Pain in left toe(s): Secondary | ICD-10-CM | POA: Diagnosis not present

## 2023-10-19 DIAGNOSIS — M79674 Pain in right toe(s): Secondary | ICD-10-CM | POA: Diagnosis not present

## 2023-10-19 DIAGNOSIS — E1151 Type 2 diabetes mellitus with diabetic peripheral angiopathy without gangrene: Secondary | ICD-10-CM | POA: Diagnosis not present

## 2023-10-19 DIAGNOSIS — L84 Corns and callosities: Secondary | ICD-10-CM

## 2023-10-19 NOTE — Progress Notes (Signed)
 Subjective:  Patient ID: Erika Freeman, female    DOB: [birth year],  MRN: [id number]  Erika Freeman presents to clinic today for:  Chief Complaint  Patient presents with   [facility]    [facility] with callous A1c was 6.1 in Jan ASA   Patient notes nails are thick and elongated, causing pain in shoe gear when ambulating.  She has a painful hyperkeratotic lesion right submet 3  PCP is Erika Freeman, Erika  Freeman, Erika Freeman.  Last seen around 09/26/2023  Past Medical History:  Diagnosis Date   Anemia    CKD (chronic kidney disease), stage III (HCC)    nephologist-- dr manuelita [facility])  and transplant clinic [facility]   Diabetes mellitus without complication (HCC)    Diverticulosis of colon    GERD (gastroesophageal reflux disease)    03-21-2019 -- per pt w/ spicy foods and at night time occasionally,  chews watermelon gum and pt states it resolves   Heart murmur    03-21-2019  per pt was told had murmur some years ago, pt is asymptomatic (last echo in [facility] 05-31-2016  trivial regurg MR and TR)   History of end stage renal disease    secondary to FSGS with PD until kidney transplant 12/ 2015   History of radiation therapy 12/20/16  to 01/17/17   left breast 50.4 gy in 28 fractions   Hypercalcemia    pt had recent hospital admision @[facility]  (in care everywhere) for acute kidney injury due to hypercalcemia and hypomagnesium,  resolved with medication, lov w/ transplant clinic [facility]  02-14-2019  calcium   normal per lab (care everywhere)    Hypertension    followed by transplant clinic @[facility] ---  (pt had stress echo 07-11-2013, negative for ischemia, in care everywhere)   Hypokalemia    pt hospital admission    Hypomagnesemia    Immunosuppression (HCC)    due to kidney transplant 02-12-2014   Kidney transplant status, living unrelated donor followed by transplant clinic [facility] -- Dr LOISE. Djxyncdxjbj   02-12-2014  @[facility]    Malignant neoplasm of breast (female) [facility]) oncologist--- dr layla (lov  in epic no recurrence 01-18-2019)   dx 03/ 2018  IDC Stage IIB ---  completed chemotherapy 10-11-2016;  s/p left breast lumpectomy with node dissection 11-14-2016;  completed radiation 01-17-2017   Personal history of chemotherapy    left breast cancer--  06-01-2016  to 10-11-2016   Personal history of radiation therapy    Primary hyperparathyroidism [facility])    12/ 2020 w/ hypercalcimia  (pt has hx of hyperparathyoidism s/p right inferior parathyroidectomy 11-27-2006 @[facility] ;  per path adenoma)   Uterine fibroid    No Known Allergies  Objective:  Erika Freeman is a pleasant 63 y.o. female in NAD. AAO x 3.  Vascular Examination: Patient has palpable DP pulse, absent PT pulse bilateral.  Delayed capillary refill bilateral toes.  Sparse digital hair bilateral.  Proximal to distal cooling WNL bilateral.    Dermatological Examination: [name] are clear with no open lesions noted bilateral.  Skin is shiny and atrophic bilateral.  Nails are 3-21mm thick, with yellowish/brown discoloration, subungual debris and distal onycholysis x10.  There is pain with compression of nails x10.  There are hyperkeratotic lesions noted right submet 3.  Patient qualifies for at-risk foot care because of diabetes with PVD.  Assessment/Plan: 1. Pain due to onychomycosis of toenails of both feet   2. Callus of foot   3. Type II diabetes mellitus with peripheral circulatory disorder (  HCC)     Mycotic nails x10 were sharply debrided with sterile nail nippers and power debriding burr to decrease bulk and length.  Hyperkeratotic lesion right submet 3 was shaved with #312 blade.  Return in about 3 months (around 01/19/2024) for [facility].   Erika Freeman, DPM, FACFAS [facility]     [address] [place], [place] [address]                Office [phone]  Fax [phone]

## 2023-10-31 DIAGNOSIS — L821 Other seborrheic keratosis: Secondary | ICD-10-CM | POA: Diagnosis not present

## 2023-10-31 DIAGNOSIS — D225 Melanocytic nevi of trunk: Secondary | ICD-10-CM | POA: Diagnosis not present

## 2023-10-31 DIAGNOSIS — L820 Inflamed seborrheic keratosis: Secondary | ICD-10-CM | POA: Diagnosis not present

## 2023-10-31 DIAGNOSIS — D2239 Melanocytic nevi of other parts of face: Secondary | ICD-10-CM | POA: Diagnosis not present

## 2023-10-31 DIAGNOSIS — L918 Other hypertrophic disorders of the skin: Secondary | ICD-10-CM | POA: Diagnosis not present

## 2023-11-02 DIAGNOSIS — C50919 Malignant neoplasm of unspecified site of unspecified female breast: Secondary | ICD-10-CM | POA: Diagnosis not present

## 2023-11-02 DIAGNOSIS — Z0000 Encounter for general adult medical examination without abnormal findings: Secondary | ICD-10-CM | POA: Diagnosis not present

## 2023-11-02 DIAGNOSIS — E876 Hypokalemia: Secondary | ICD-10-CM | POA: Diagnosis not present

## 2023-11-02 DIAGNOSIS — N058 Unspecified nephritic syndrome with other morphologic changes: Secondary | ICD-10-CM | POA: Diagnosis not present

## 2023-11-02 DIAGNOSIS — E212 Other hyperparathyroidism: Secondary | ICD-10-CM | POA: Diagnosis not present

## 2023-11-02 DIAGNOSIS — E785 Hyperlipidemia, unspecified: Secondary | ICD-10-CM | POA: Diagnosis not present

## 2023-11-02 DIAGNOSIS — I10 Essential (primary) hypertension: Secondary | ICD-10-CM | POA: Diagnosis not present

## 2023-11-02 DIAGNOSIS — Z940 Kidney transplant status: Secondary | ICD-10-CM | POA: Diagnosis not present

## 2023-11-02 DIAGNOSIS — E139 Other specified diabetes mellitus without complications: Secondary | ICD-10-CM | POA: Diagnosis not present

## 2023-11-03 ENCOUNTER — Ambulatory Visit: Payer: Medicare Other | Admitting: Internal Medicine

## 2023-11-09 ENCOUNTER — Ambulatory Visit
Admission: RE | Admit: 2023-11-09 | Discharge: 2023-11-09 | Disposition: A | Discharge status: Home / Self Care | Source: Ambulatory Visit | Attending: Hematology and Oncology | Admitting: Hematology and Oncology

## 2023-11-09 ENCOUNTER — Encounter: Payer: Self-pay | Admitting: Oncology

## 2023-11-09 DIAGNOSIS — Z1231 Encounter for screening mammogram for malignant neoplasm of breast: Secondary | ICD-10-CM | POA: Diagnosis not present

## 2023-11-16 DIAGNOSIS — N390 Urinary tract infection, site not specified: Secondary | ICD-10-CM | POA: Diagnosis not present

## 2023-11-16 DIAGNOSIS — R319 Hematuria, unspecified: Secondary | ICD-10-CM | POA: Diagnosis not present

## 2023-11-24 ENCOUNTER — Ambulatory Visit: Admitting: Internal Medicine

## 2023-11-24 ENCOUNTER — Encounter: Payer: Self-pay | Admitting: Internal Medicine

## 2023-11-24 VITALS — BP 120/80 | HR 80 | Ht 65.0 in | Wt 164.0 lb

## 2023-11-24 DIAGNOSIS — E785 Hyperlipidemia, unspecified: Secondary | ICD-10-CM

## 2023-11-24 DIAGNOSIS — N1831 Chronic kidney disease, stage 3a: Secondary | ICD-10-CM | POA: Diagnosis not present

## 2023-11-24 DIAGNOSIS — E1122 Type 2 diabetes mellitus with diabetic chronic kidney disease: Secondary | ICD-10-CM | POA: Diagnosis not present

## 2023-11-24 DIAGNOSIS — E119 Type 2 diabetes mellitus without complications: Secondary | ICD-10-CM

## 2023-11-24 LAB — POCT GLYCOSYLATED HEMOGLOBIN (HGB A1C): Hemoglobin A1C: 6.2 % — AB (ref 4.0–5.6)

## 2023-11-24 MED ORDER — RYBELSUS 3 MG PO TABS: 1.0000 | ORAL_TABLET | Freq: Every day | ORAL | 3 refills | Status: AC

## 2023-11-24 NOTE — Addendum Note (Signed)
 Addended by: OCTAVIO DIETRICH CROME on: 11/24/2023 10:46 AM   Modules accepted: Orders

## 2023-11-24 NOTE — Progress Notes (Signed)
 Patient ID: Erika Freeman, female   DOB: Jan 04, 1961, 63 y.o.   MRN: [id number]  HPI: Erika Freeman is a 63 y.o.-year-old female, returning for follow-up for DM2, dx in 2020, non-insulin-dependent, controlled, without long-term complications. Pt. previously saw Dr. Kassie, but last visit with me 1 year ago.  She is here with her husband.  Interim history: She has chronically increased urination, no blurry vision, nausea, chest pain.  Reviewed HbA1c: 09/26/2023: HbA1c 6.3%  Lab Results  Component Value Date   HGBA1C 5.8 (A) 12/24/2021   HGBA1C 5.7 (A) 06/18/2021   HGBA1C 5.6 12/11/2020   HGBA1C 5.5 06/05/2020   HGBA1C 5.5 01/31/2020   HGBA1C 5.7 (A) 09/20/2019   HGBA1C 7.7 (A) 06/18/2019  02/24/2021: HbA1c 6.0%.  Pt is on a regimen of: - Rybelsus  3 mg daily in am  Pt checks her sugars occasionally: - am: 85-94 >> 80s-90s, 110 >> 100-110 - 2h after b'fast: n/c - before lunch: n/c - 2h after lunch: 122 >> 190 >> n/c - before dinner: n/c - 2h after dinner: n/c >> 140 - bedtime: n/c - nighttime: n/c Lowest sugar was 70s >> 80 >> 100; she has hypoglycemia awareness at 70.  Highest sugar was 125 >> 190 x1 >> 140  Glucometer: AccuCheck [employer]iva  - + CKD - seeing Dr. Norine (nephrology), last BUN/creatinine:  09/26/2023: 18/0.96, GFR 67, ACR 53.6, glucose 109 Lab Results  Component Value Date   BUN 17 12/30/2021   BUN 20 11/16/2020   CREATININE 1.13 (H) 12/30/2021   CREATININE 1.12 (H) 11/16/2020   -+ HL; last set of lipids: 09/26/2023: 165/217/41/88  No results found for: CHOL, HDL, LDLCALC, LDLDIRECT, TRIG, CHOLHDL On Lipitor 10 mg daily.  - last eye exam was in 2024. No DR or cataract reportedly. She wears progressive lenses.  - no numbness and tingling in her feet.  Last foot exam-by Dr. Loel 10/19/2023.  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  She previously had hyperparathyroidism and had to have  parathyroidectomy.  She also has a history of HTN, anemia, GERD, hypomagnesemia. She is on prednisone  5 mg daily.  ROS: + see HPI  Past Medical History:  Diagnosis Date   Anemia    CKD (chronic kidney disease), stage III (HCC)    nephologist-- dr [facility]ita [facility])  and transplant clinic [facility]   Diabetes mellitus without complication (HCC)    Diverticulosis of colon    GERD (gastroesophageal reflux disease)    03-21-2019 -- per pt w/ spicy foods and at night time occasionally,  chews watermelon gum and pt states it resolves   Heart murmur    03-21-2019  per pt was told had murmur some years ago, pt is asymptomatic (last echo in epci 05-31-2016  trivial regurg MR and TR)   History of end stage renal disease    secondary to FSGS with PD until kidney transplant 12/ 2015   History of radiation therapy 12/20/16  to 01/17/17   left breast 50.4 gy in 28 fractions   Hypercalcemia    pt had recent hospital admision @[facility]  (in care everywhere) for acute kidney injury due to hypercalcemia and hypomagnesium,  resolved with medication, lov w/ transplant clinic [facility]  02-14-2019  calcium   normal per lab (care everywhere)    Hypertension    followed by transplant clinic @[facility] ---  (pt had stress echo 07-11-2013, negative for ischemia, in care everywhere)   Hypokalemia  pt hospital admission    Hypomagnesemia    Immunosuppression    due to kidney transplant 02-12-2014   Kidney transplant status, living unrelated donor followed by transplant clinic [facility] -- Dr LOISE. Djxyncdxjbj   02-12-2014  @[facility]    Malignant neoplasm of breast (female) [facility]) oncologist--- dr layla (lov in epic no recurrence 01-18-2019)   dx 03/ 2018  IDC Stage IIB ---  completed chemotherapy 10-11-2016;  s/p left breast lumpectomy with node dissection 11-14-2016;  completed radiation 01-17-2017   Personal history of chemotherapy    left breast cancer--  06-01-2016  to 10-11-2016   Personal history of radiation  therapy    Primary hyperparathyroidism    12/ 2020 w/ hypercalcimia  (pt has hx of hyperparathyoidism s/p right inferior parathyroidectomy 11-27-2006 @[facility] ;  per path adenoma)   Uterine fibroid    Past Surgical History:  Procedure Laterality Date   BREAST BIOPSY     BREAST LUMPECTOMY Left    2018   BREAST LUMPECTOMY WITH RADIOACTIVE SEED AND SENTINEL LYMPH NODE BIOPSY Left 11/14/2016   Procedure: LEFT BREAST RADIOACTIVE SEED BRACKETED LUMPECTOMY, LEFT AXILLARY SENTINEL LYMPH NODE BIOPSY WITH BLUE DYE INJECTION;  Surgeon: Ebbie Cough, MD;  Location: [facility] OR;  Service: General;  Laterality: Left;   BREAST SURGERY  2012   left - fibroadenoma   CAPD INSERTION  01/10/2012   Procedure: LAPAROSCOPIC INSERTION CONTINUOUS AMBULATORY PERITONEAL DIALYSIS  (CAPD) CATHETER;  Surgeon: Elspeth KYM Schultze, MD;  Location: [facility] OR;  Service: General;  Laterality: N/A;  LAPAROSCOPIC CAPD CATHETER PLACEMENT OMENTOPEXY   CESAREAN SECTION  1995   COLONOSCOPY     DILATION AND CURETTAGE OF UTERUS  2000   KIDNEY TRANSPLANT     PARATHYROIDECTOMY Right 11-27-2006   dr gerkin @[facility]    right inferior  (adenoma)   PARATHYROIDECTOMY Right 03/25/2019   Procedure: RIGHT SUPERIOR PARATHYROIDECTOMY;  Surgeon: Eletha Boas, MD;  Location: [facility];  Service: General;  Laterality: Right;   PORT-A-CATH REMOVAL Right 11/14/2016   Procedure: REMOVAL PORT-A-CATH;  Surgeon: Ebbie Cough, MD;  Location: [facility] OR;  Service: General;  Laterality: Right;   PORTACATH PLACEMENT Right 05/24/2016   Procedure: INSERTION PORT-A-CATH WITH US ;  Surgeon: Ebbie Cough, MD;  Location: [facility] OR;  Service: General;  Laterality: Right;   RENAL BIOPSY  2011   TUBAL LIGATION     UMBILICAL HERNIA REPAIR  01/10/2012   Procedure: HERNIA REPAIR UMBILICAL ADULT;  Surgeon: Elspeth KYM Schultze, MD;  Location: [facility] OR;  Service: General;  Laterality: N/A;   Social History   Socioeconomic History   Marital status: Married    Spouse name:  Not on file   Number of children: 3   Years of education: Not on file   Highest education level: Not on file  Occupational History   Not on file  Tobacco Use   Smoking status: Never   Smokeless tobacco: Never  Vaping Use   Vaping status: Never Used  Substance and Sexual Activity   Alcohol use: No   Drug use: Never   Sexual activity: Not on file  Other Topics Concern   Not on file  Social History Narrative   Not on file   Social Drivers of Health   Financial Resource Strain: Not on file  Food Insecurity: No Food Insecurity (09/20/2021)   Hunger Vital Sign    Worried About Running Out of Food in the Last Year: Never true    Ran Out of Food in the Last Year: Never true  Transportation Needs: No Transportation Needs (09/20/2021)   PRAPARE - Administrator, Civil Service (Medical): No    Lack of Transportation (Non-Medical): No  Physical Activity: Not on file  Stress: Not on file  Social Connections: Not on file  Intimate Partner Violence: Not on file   Current Outpatient Medications on File Prior to Visit  Medication Sig Dispense Refill   amLODipine  (NORVASC ) 10 MG tablet 1 tablet     aspirin  81 MG tablet Take 81 mg by mouth every morning.      atorvastatin  (LIPITOR) 10 MG tablet 1 tablet     cholecalciferol (VITAMIN D3) 25 MCG (1000 UNIT) tablet Take 1,000 Units by mouth daily.     glucose blood (ACCU-CHEK AVIVA PLUS) test strip Use to check blood sugar once a day. 100 each 12   Magnesium  Oxide (MAG-OXIDE) 200 MG TABS See admin instructions.     potassium chloride  SA (KLOR-CON ) 20 MEQ tablet Take 40 mEq by mouth 2 (two) times daily.     predniSONE  (DELTASONE ) 5 MG tablet Take 5 mg by mouth every morning.      Semaglutide  (RYBELSUS ) 3 MG TABS Take 1 tablet (3 mg total) by mouth daily. 90 tablet 3   tacrolimus  (PROGRAF ) 1 MG capsule See admin instructions.     No current facility-administered medications on file prior to visit.   No Known Allergies Family  History  Problem Relation Age of Onset   Cancer Maternal Uncle        lung   Diabetes Maternal Grandmother    Cancer Cousin        colon   Colon cancer Neg Hx    Colon polyps Neg Hx    Esophageal cancer Neg Hx    Stomach cancer Neg Hx    Rectal cancer Neg Hx    Breast cancer Neg Hx    PE: BP 120/80 (BP Location: Left Arm, Patient Position: Sitting, Cuff Size: Normal)   Pulse 80   Ht 5' 5 (1.651 m)   Wt 164 lb (74.4 kg)   LMP 04/27/2016 (Exact Date)   SpO2 95%   BMI 27.29 kg/m  Wt Readings from Last 3 Encounters:  11/24/23 164 lb (74.4 kg)  01/03/23 165 lb 1.6 oz (74.9 kg)  10/28/22 162 lb 6.4 oz (73.7 kg)   Constitutional: normal weight, in NAD Eyes: no exophthalmos ENT: no thyromegaly, no cervical lymphadenopathy, 2 parathyroidectomy scars healed Cardiovascular: RRR, No RG, +1/6 SEM Respiratory: CTA B Musculoskeletal: no deformities Skin: no rashes Neurological: no tremor with outstretched hands  ASSESSMENT: 1. DM2, non-insulin-dependent, controlled, without long-term complications -She does have a history of end stage renal disease 2/2 FSGS, s/p renal transplant in 2015 -She developed diabetes after the transplant, but not immediately after, to qualify for NODAT (new onset diabetes after transplant)  2. HL  PLAN:  1. Patient with history of longstanding, type 2 diabetes, after her renal transplant and subsequent breast cancer diagnosis, on daily oral GLP-1 receptor agonist, with good control.  At last visit, HbA1c was 5.6%, but she had another HbA1c obtained 2 months ago and this was slightly higher, at 6.3%. -At last visit, sugars were at goal with only 1 exception, when her blood sugars increased to 190s after lunch.  We did not have to change the regimen at that time.  I refilled Rybelsus  for a year and advised her to also check some sugars later in the day and let me know if they increase. -At today's  visit, she is still not checking sugars consistently and we  discussed about starting to do so, rotating check times, especially as her HbA1c is still higher than previously, but slightly improved at today's visit (see below).  For now, we discussed about continuing the same dose of Rybelsus  but we may need to increase the dose if her sugars start to increase.  If Rybelsus  starts not being covered by insurance, we could switch to an SGLT2 inhibitor but I am reticent to suggest this for now since she does have increased urination usually. - I suggested to:  Patient Instructions  Please continue Rybelsus  3 mg daily in am.  Try to check sugars once a day or every other day, rotating check times.  Please return in 6 months.  - we checked her HbA1c: 6.2% (slightly lower) - advised to check sugars at different times of the day - 1x a day, rotating check times - advised for yearly eye exams >> she is UTD - return to clinic in 6 months.  2. HL -Latest lipid panel was reviewed from 09/26/2023: 165/217/41/88 - LDL slightly higher than our goal of less than 70, triglycerides elevated No results found for: CHOL, HDL, LDLCALC, LDLDIRECT, TRIG, CHOLHDL - She is on Lipitor 10 mg daily without side effects  Lela Fendt, MD PhD [facility]

## 2023-11-24 NOTE — Patient Instructions (Addendum)
 Please continue Rybelsus  3 mg daily in am.  Try to check sugars once a day or every other day, rotating check times.  Please return in 6 months.

## 2024-01-01 NOTE — Progress Notes (Signed)
 Erika Freeman                                          MRN: [id number]   01/01/2024   The [facility] Quality Team Specialist reviewed this patient medical record for the purposes of chart review for care gap closure. The following were reviewed: abstraction for care gap closure-glycemic status assessment.    [facility]BCI Quality Team

## 2024-01-04 ENCOUNTER — Inpatient Hospital Stay: Payer: Medicare Other | Attending: Hematology and Oncology | Admitting: Hematology and Oncology

## 2024-01-04 VITALS — BP 118/77 | HR 80 | Temp 98.2°F | Resp 17 | Wt 166.1 lb

## 2024-01-04 DIAGNOSIS — C50011 Malignant neoplasm of nipple and areola, right female breast: Secondary | ICD-10-CM | POA: Diagnosis not present

## 2024-01-04 DIAGNOSIS — C50312 Malignant neoplasm of lower-inner quadrant of left female breast: Secondary | ICD-10-CM | POA: Diagnosis not present

## 2024-01-04 DIAGNOSIS — Z170 Estrogen receptor positive status [ER+]: Secondary | ICD-10-CM | POA: Diagnosis not present

## 2024-01-04 DIAGNOSIS — C50812 Malignant neoplasm of overlapping sites of left female breast: Secondary | ICD-10-CM | POA: Diagnosis present

## 2024-01-04 DIAGNOSIS — Z79811 Long term (current) use of aromatase inhibitors: Secondary | ICD-10-CM | POA: Insufficient documentation

## 2024-01-04 NOTE — Progress Notes (Signed)
 [facility]  [phone] [phone]     ID: Erika Freeman DOB: 1960/10/14  MR#: [id number]  RDW#:[id number]  Patient Care Team: Cleotilde Pamala BRAVO, PA as PCP - General (Internal Medicine) Perri Starleen BROCKS, MD as Consulting Physician (Nephrology) Rosalynn LELON Ingle, MD (Inactive) as Consulting Physician (Obstetrics and Gynecology) Ebbie Cough, MD as Consulting Physician (General Surgery) Shannon Agent, MD as Consulting Physician (Radiation Oncology) Juliaette Redbird, MD as Referring Physician (Surgery) Caprice Gauze, DO (Internal Medicine)  CHIEF COMPLAINT: Estrogen receptor positive breast cancer  CURRENT TREATMENT: Observation  INTERVAL HISTORY:   Discussed the use of AI scribe software for clinical note transcription with the patient, who gave verbal consent to proceed.  History of Present Illness Erika Freeman is a 63 year old female who presents for follow-up regarding a recent colposcopy and upcoming LEEP procedure.  Her OB GYN has been monitoring her for the past two years due to normal Pap smears but abnormal findings on colposcopy. The most recent colposcopy, performed last week, led to a recommendation for a LEEP procedure to address the findings.  She has a history of breast cancer diagnosed in 2018, for which she underwent chemotherapy, surgery, and radiation. She completed a five-year course of anastrozole , which she stopped in 2023. She has not noticed any changes in her breast and her recent mammogram was normal.  She is scheduled for a bone density scan in January, as her last scan was four to five years ago and was normal.  Rest of the pertinent 10 point ROS reviewed and negative   COVID 19 VACCINATION STATUS: Status post Pfizer x4 as of September 2022   BREAST CANCER HISTORY: From the original intake note:  Erika Freeman herself noted a mass in her left breast. She ignored it because she thought it was related to  menstruation. However as it persisted through 1. She brought it to Dr. Malone attention and on 05/10/2016 she underwent left diagnostic mammography with tomography [facility] The breast density was category C. In the left breast lower central area there was a new circumscribed mass. This was firm and fixed in the lower inner left breast at middle depth. Ultrasonography confirmed a 2.6 cm hypoechoic irregular mass at the 6:30 o'clock radiant 3 cm from the nipple. There was also a 0.5 cm nodule 1.4 cm medial to the mass just described.  Biopsy of the left breast mass in question 05/10/2016 showed([id number]) invasive ductal carcinoma, grade 3, estrogen receptor 70% positive with weak staining intensity, progesterone receptor negative, with no HER-2 amplification, the signals ratio being 1.52 and the number per cell 2.35. The proliferation marker was 90%.  Her subsequent history is as detailed below  OF NOTE: The patient has a history of focal segmental glomerular sclerosis with end-stage renal disease and is status post unrelated donor renal transplant 04/15/2013 as part of appeared donor exchange with her husband donating one kidney, the other harvested from a 62 year old [place]  man. She continues on immunosuppression with mycophenolate and tacrolimus .   PAST MEDICAL HISTORY: Past Medical History:  Diagnosis Date   Anemia    CKD (chronic kidney disease), stage III (HCC)    nephologist-- dr [facility]uelita [facility])  and transplant clinic [facility]   Diabetes mellitus without complication (HCC)    Diverticulosis of colon    GERD (gastroesophageal reflux disease)    03-21-2019 -- per pt w/ spicy foods and at night time occasionally,  chews  watermelon gum and pt states it resolves   Heart murmur    03-21-2019  per pt was told had murmur some years ago, pt is asymptomatic (last echo in [facility] 05-31-2016  trivial regurg MR and TR)   History of end stage  renal disease    secondary to FSGS with PD until kidney transplant 12/ 2015   History of radiation therapy 12/20/16  to 01/17/17   left breast 50.4 gy in 28 fractions   Hypercalcemia    pt had recent hospital admision @[facility]  (in care everywhere) for acute kidney injury due to hypercalcemia and hypomagnesium,  resolved with medication, lov w/ transplant clinic @[facility]  02-14-2019  calcium   normal per lab (care everywhere)    Hypertension    followed by transplant clinic @[facility] ---  (pt had stress echo 07-11-2013, negative for ischemia, in care everywhere)   Hypokalemia    pt hospital admission    Hypomagnesemia    Immunosuppression    due to kidney transplant 02-12-2014   Kidney transplant status, living unrelated donor followed by transplant clinic @[facility] -- Dr LOISE. Djxyncdxjbj   02-12-2014  @[facility]    Malignant neoplasm of breast (female) [facility]) oncologist--- dr layla (lov in epic no recurrence 01-18-2019)   dx 03/ 2018  IDC Stage IIB ---  completed chemotherapy 10-11-2016;  s/p left breast lumpectomy with node dissection 11-14-2016;  completed radiation 01-17-2017   Personal history of chemotherapy    left breast cancer--  06-01-2016  to 10-11-2016   Personal history of radiation therapy    Primary hyperparathyroidism    12/ 2020 w/ hypercalcimia  (pt has hx of hyperparathyoidism s/p right inferior parathyroidectomy 11-27-2006 @[facility] ;  per path adenoma)   Uterine fibroid     PAST SURGICAL HISTORY: Past Surgical History:  Procedure Laterality Date   BREAST BIOPSY     BREAST LUMPECTOMY Left    2018   BREAST LUMPECTOMY WITH RADIOACTIVE SEED AND SENTINEL LYMPH NODE BIOPSY Left 11/14/2016   Procedure: LEFT BREAST RADIOACTIVE SEED BRACKETED LUMPECTOMY, LEFT AXILLARY SENTINEL LYMPH NODE BIOPSY WITH BLUE DYE INJECTION;  Surgeon: Ebbie Cough, MD;  Location: [facility] OR;  Service: General;  Laterality: Left;   BREAST SURGERY  2012   left - fibroadenoma   CAPD INSERTION  01/10/2012   Procedure:  LAPAROSCOPIC INSERTION CONTINUOUS AMBULATORY PERITONEAL DIALYSIS  (CAPD) CATHETER;  Surgeon: Elspeth KYM Schultze, MD;  Location: [facility] OR;  Service: General;  Laterality: N/A;  LAPAROSCOPIC CAPD CATHETER PLACEMENT OMENTOPEXY   CESAREAN SECTION  1995   COLONOSCOPY     DILATION AND CURETTAGE OF UTERUS  2000   KIDNEY TRANSPLANT     PARATHYROIDECTOMY Right 11-27-2006   dr gerkin @[facility]    right inferior  (adenoma)   PARATHYROIDECTOMY Right 03/25/2019   Procedure: RIGHT SUPERIOR PARATHYROIDECTOMY;  Surgeon: Eletha Boas, MD;  Location[facility];  Service: General;  Laterality: Right;   PORT-A-CATH REMOVAL Right 11/14/2016   Procedure: REMOVAL PORT-A-CATH;  Surgeon: Ebbie Cough, MD;  Location[facility] OR;  Service: General;  Laterality: Right;   PORTACATH PLACEMENT Right 05/24/2016   Procedure: INSERTION PORT-A-CATH WITH US ;  Surgeon: Ebbie Cough, MD;  Location: [facility] OR;  Service: General;  Laterality: Right;   RENAL BIOPSY  2011   TUBAL LIGATION     UMBILICAL HERNIA REPAIR  01/10/2012   Procedure: HERNIA REPAIR UMBILICAL ADULT;  Surgeon: Elspeth KYM Schultze, MD;  Location: [facility] OR;  Service: General;  Laterality: N/A;    FAMILY HISTORY Family History  Problem Relation Age of Onset  Cancer Maternal Uncle        lung   Diabetes Maternal Grandmother    Cancer Cousin        colon   Colon cancer Neg Hx    Colon polyps Neg Hx    Esophageal cancer Neg Hx    Stomach cancer Neg Hx    Rectal cancer Neg Hx    Breast cancer Neg Hx   The patient has very little information regarding her father. Her mother is living, 60 years old as of March 2018. The patient had one full brother, diagnosed with prostate cancer in his 84s. The patient has 2 half-brothers and 1 half-sister. There is no history of breast or ovarian cancer in the family to the patient's knowledge    GYNECOLOGIC HISTORY:  Patient's last menstrual period was 04/27/2016 (exact date).  Menarche age 46, first live birth age 49, the  patient is GX P3. Her periods are regular, the last 5 or 6 days, with no heavy days. 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 Her husband Vaughan works at home [employer]. Son Penne lives in [place] and work as production designer, theatre/television/film of a print production planner. Son Bernardino lives in [place]  and is a armed forces training and education officer. Son Jordan lives in [place] and is studying based trombone. The patient has 2 grandchildren. She attends a local nondenominational [employer]     ADVANCED DIRECTIVES: In the absence of any documents to the contrary her husband is her healthcare     HEALTH MAINTENANCE: Social History   Tobacco Use   Smoking status: Never   Smokeless tobacco: Never  Vaping Use   Vaping status: Never Used  Substance Use Topics   Alcohol use: No   Drug use: Never     Colonoscopy:  PAP:  Bone density:   No Known Allergies  Current Outpatient Medications  Medication Sig Dispense Refill   amLODipine  (NORVASC ) 10 MG tablet 1 tablet     aspirin  81 MG tablet Take 81 mg by mouth every morning.      atorvastatin  (LIPITOR) 10 MG tablet 1 tablet     cholecalciferol (VITAMIN D3) 25 MCG (1000 UNIT) tablet Take 1,000 Units by mouth daily.     glucose blood (ACCU-CHEK AVIVA PLUS) test strip Use to check blood sugar once a day. 100 each 12   Magnesium  Oxide (MAG-OXIDE) 200 MG TABS See admin instructions.     potassium chloride  SA (KLOR-CON ) 20 MEQ tablet Take 40 mEq by mouth 2 (two) times daily.     predniSONE  (DELTASONE ) 5 MG tablet Take 5 mg by mouth every morning.      Semaglutide  (RYBELSUS ) 3 MG TABS Take 1 tablet (3 mg total) by mouth daily. 90 tablet 3   tacrolimus  (PROGRAF ) 1 MG capsule See admin instructions.     No current facility-administered medications for this visit.    OBJECTIVE:  Vitals:   01/04/24 0834  BP: 118/77  Pulse: 80  Resp: 17  Temp: 98.2 F (36.8 C)  SpO2: 99%       Body mass index is 27.64 kg/m.  Filed Weights   01/04/24 0834  Weight: 166 lb 1.6 oz (75.3 kg)    Filed Weights   01/04/24 0834  Weight: 166 lb 1.6 oz (75.3 kg)    ECOG FS:1 - Symptomatic but completely ambulatory   Physical Exam Constitutional:      Appearance: Normal appearance.  Chest:     Comments: Bilateral breasts inspected.  No palpable masses or regional adenopathy. Musculoskeletal:        General: Normal range of motion.     Cervical back: Normal range of motion and neck supple. No rigidity.  Neurological:     Mental Status: She is alert.  Psychiatric:        Mood and Affect: Mood normal.      LAB RESULTS:  CMP     Component Value Date/Time   NA 142 12/30/2021 1331   NA 142 02/14/2017 1012   K 3.7 12/30/2021 1331   K 2.8 (LL) 02/14/2017 1012   CL 106 12/30/2021 1331   CO2 31 12/30/2021 1331   CO2 30 (H) 02/14/2017 1012   GLUCOSE 143 (H) 12/30/2021 1331   GLUCOSE 104 02/14/2017 1012   BUN 17 12/30/2021 1331   BUN 14.1 02/14/2017 1012   CREATININE 1.13 (H) 12/30/2021 1331   CREATININE 1.2 (H) 02/14/2017 1012   CALCIUM  10.4 (H) 12/30/2021 1331   CALCIUM  9.6 02/14/2017 1012   PROT 7.6 12/30/2021 1331   PROT 7.8 02/14/2017 1012   ALBUMIN 4.4 12/30/2021 1331   ALBUMIN 4.4 02/14/2017 1012   AST 18 12/30/2021 1331   AST 15 02/14/2017 1012   ALT 17 12/30/2021 1331   ALT 15 02/14/2017 1012   ALKPHOS 63 12/30/2021 1331   ALKPHOS 72 02/14/2017 1012   BILITOT 0.6 12/30/2021 1331   BILITOT 0.53 02/14/2017 1012   GFRNONAA 56 (L) 12/30/2021 1331   GFRAA 54 (L) 11/13/2019 1408    No results found for: TOTALPROTELP, ALBUMINELP, A1GS, A2GS, BETS, BETA2SER, GAMS, MSPIKE, SPEI  No results found for: JONATHAN BONG, [facility]  Lab Results  Component Value Date   WBC 12.6 (H) 12/30/2021   NEUTROABS 10.2 (H) 12/30/2021   HGB 13.2 12/30/2021   HCT 40.2 12/30/2021   MCV 87.2 12/30/2021   PLT 209 12/30/2021       Chemistry      Component Value Date/Time   NA 142 12/30/2021 1331   NA 142 02/14/2017 1012   K 3.7 12/30/2021 1331   K 2.8 (LL) 02/14/2017 1012   CL 106 12/30/2021 1331   CO2 31 12/30/2021 1331   CO2 30 (H) 02/14/2017 1012   BUN 17 12/30/2021 1331   BUN 14.1 02/14/2017 1012   CREATININE 1.13 (H) 12/30/2021 1331   CREATININE 1.2 (H) 02/14/2017 1012      Component Value Date/Time   CALCIUM  10.4 (H) 12/30/2021 1331   CALCIUM  9.6 02/14/2017 1012   ALKPHOS 63 12/30/2021 1331   ALKPHOS 72 02/14/2017 1012   AST 18 12/30/2021 1331   AST 15 02/14/2017 1012   ALT 17 12/30/2021 1331   ALT 15 02/14/2017 1012   BILITOT 0.6 12/30/2021 1331   BILITOT 0.53 02/14/2017 1012       No results found for: LABCA2  No components found for: [id number]  No results for input(s): INR in the last 168 hours.  Urinalysis    Component Value Date/Time   COLORURINE YELLOW 11/24/2006 1539   APPEARANCEUR CLEAR 11/24/2006 1539   LABSPEC 1.018 11/24/2006 1539   PHURINE 6.0 11/24/2006 1539   GLUCOSEU NEGATIVE 11/24/2006 1539   HGBUR SMALL (A) 11/24/2006 1539   BILIRUBINUR NEGATIVE 11/24/2006 1539   KETONESUR NEGATIVE 11/24/2006 1539   PROTEINUR 100 (A) 11/24/2006 1539   UROBILINOGEN 1.0 11/24/2006 1539   NITRITE NEGATIVE 11/24/2006 1539   LEUKOCYTESUR MODERATE (A) 11/24/2006 1539    STUDIES: No  results found.    ELIGIBLE FOR AVAILABLE RESEARCH PROTOCOL: no  ASSESSMENT: 63 y.o. [place], [place] woman status post left breast lower inner quadrant biopsy 05/10/2016 for a clinical  T2 N0, prognostic stage IIB invaside ductal carcinoma, grade 3, weekly estrogen receptor positive, progesterone receptor negative, with no HER-2 amplification, and the MIB-1 at 90%  (a) biopsy of the 0.6 mm satellite at the 6:30 o'clock radiant in the left breast 06/02/2016 showed invasive ductal carcinoma, grade 3, estrogen receptor 20% positive, with weak staining intensity, progesterone receptor negative with no HER-2  amplification, and an MIB-1 of 70% (identical to larger mass)   (1) neoadjuvant chemotherapy consisting of doxorubicin  and cyclophosphamide  in dose dense fashion 4 started 06/01/2016, completed 07/12/2016  followed by Paclitaxel  weekly 12 started 07/26/2016, completed 10/11/2016  (2) definitive surgery 11/14/2016 found a residual pT1a pN0 area of invasive ductal carcinoma (0.2 mm), with negative margins.  (3) adjuvant radiation 12/20/16-01/17/17  Site/dose:   Left Breast, 50.4 Gy total delivered in 28 fractions   (4) started anastrozole  02/28/2017  (a) left heel DEXA scan 11/24/2004 was normal  (b) renal feels any additional radiation may be detrimental so bone density not updated  (C) repeat bone density 12/17/2019 showed a T score of -0.1             (D) completed 5 yrs.  (5) hypercalcemia with worsening renal function  (a) denosumab  60 mg and IV fluids given 08/03/2018  (b) bone scan 08/10/2018 shows no evidence of metastatic disease.   PLAN:  Assessment and Plan Assessment & Plan Abnormal cervical cytology, undergoing LEEP procedure Abnormal colposcopy findings with normal Pap smears. LEEP procedure planned to excise abnormal tissue. - Proceed with LEEP procedure as scheduled.  History of breast cancer, stage 2B, status post treatment Stage 2B breast cancer treated with chemotherapy, surgery, radiation, and anastrozole . Low recurrence risk due to aggressive treatment and normal mammograms. - Continue annual surveillance for breast cancer recurrence until the ten-year mark.  Surveillance for osteoporosis due to prior aromatase inhibitor therapy Osteoporosis surveillance due to prior anastrozole  therapy. Last bone density scan over four years ago. - Schedule bone density scan in January.  Total time spent: 30  min *Total Encounter Time as defined by the Centers for Medicare and Medicaid Services includes, in addition to the face-to-face time of a patient visit (documented in  the note above) non-face-to-face time: obtaining and reviewing outside history, ordering and reviewing medications, tests or procedures, care coordination (communications with other health care professionals or caregivers) and documentation in the medical record.

## 2024-01-18 ENCOUNTER — Ambulatory Visit (INDEPENDENT_AMBULATORY_CARE_PROVIDER_SITE_OTHER): Admitting: Podiatry

## 2024-01-18 DIAGNOSIS — L84 Corns and callosities: Secondary | ICD-10-CM

## 2024-01-18 DIAGNOSIS — B351 Tinea unguium: Secondary | ICD-10-CM | POA: Diagnosis not present

## 2024-01-18 DIAGNOSIS — M79674 Pain in right toe(s): Secondary | ICD-10-CM | POA: Diagnosis not present

## 2024-01-18 DIAGNOSIS — M79675 Pain in left toe(s): Secondary | ICD-10-CM

## 2024-01-18 DIAGNOSIS — E1151 Type 2 diabetes mellitus with diabetic peripheral angiopathy without gangrene: Secondary | ICD-10-CM | POA: Diagnosis not present

## 2024-01-18 NOTE — Progress Notes (Signed)
 Subjective:  Patient ID: Erika Freeman, female    DOB: 27-Nov-1960,  MRN: [id number]  Erika Freeman presents to clinic today for:  Chief Complaint  Patient presents with   [facility]    Last A1c: 6.2, asa 81 mg, callous right submet.    Patient notes nails are thick and elongated, causing pain in shoe gear when ambulating.  She has a painful hyperkeratotic lesion right submet 3  PCP is Cleotilde, Virginia  E, PA.  Last seen around 09/26/2023  Past Medical History:  Diagnosis Date   Anemia    CKD (chronic kidney disease), stage III (HCC)    nephologist-- dr [facility])  and transplant clinic [facility]   Diabetes mellitus without complication (HCC)    Diverticulosis of colon    GERD (gastroesophageal reflux disease)    03-21-2019 -- per pt w/ spicy foods and at night time occasionally,  chews watermelon gum and pt states it resolves   Heart murmur    03-21-2019  per pt was told had murmur some years ago, pt is asymptomatic (last echo in [facility] 05-31-2016  trivial regurg MR and TR)   History of end stage renal disease    secondary to FSGS with PD until kidney transplant 12/ 2015   History of radiation therapy 12/20/16  to 01/17/17   left breast 50.4 gy in 28 fractions   Hypercalcemia    pt had recent hospital admision @[facility]  (in care everywhere) for acute kidney injury due to hypercalcemia and hypomagnesium,  resolved with medication, lov w/ transplant clinic @[facility]  02-14-2019  calcium   normal per lab (care everywhere)    Hypertension    followed by transplant clinic @[facility] ---  (pt had stress echo 07-11-2013, negative for ischemia, in care everywhere)   Hypokalemia    pt hospital admission    Hypomagnesemia    Immunosuppression    due to kidney transplant 02-12-2014   Kidney transplant status, living unrelated donor followed by transplant clinic @[facility] -- Dr LOISE. Djxyncdxjbj   02-12-2014  @[facility]    Malignant neoplasm of breast (female) [facility]) oncologist--- dr layla (lov  in epic no recurrence 01-18-2019)   dx 03/ 2018  IDC Stage IIB ---  completed chemotherapy 10-11-2016;  s/p left breast lumpectomy with node dissection 11-14-2016;  completed radiation 01-17-2017   Personal history of chemotherapy    left breast cancer--  06-01-2016  to 10-11-2016   Personal history of radiation therapy    Primary hyperparathyroidism    12/ 2020 w/ hypercalcimia  (pt has hx of hyperparathyoidism s/p right inferior parathyroidectomy 11-27-2006 @[facility] ;  per path adenoma)   Uterine fibroid    No Known Allergies  Objective:  Erika Freeman is a pleasant 63 y.o. female in NAD. AAO x 3.  Vascular Examination: Patient has palpable DP pulse, absent PT pulse bilateral.  Delayed capillary refill bilateral toes.  Sparse digital hair bilateral.  Proximal to distal cooling WNL bilateral.    Dermatological Examination: [name] are clear with no open lesions noted bilateral.  Skin is shiny and atrophic bilateral.  Nails are 3-13mm thick, with yellowish/brown discoloration, subungual debris and distal onycholysis x10.  There is pain with compression of nails x10.  There are hyperkeratotic lesions noted right submet 3.  Patient qualifies for at-risk foot care because of diabetes with PVD.  Assessment/Plan: 1. Pain due to onychomycosis of toenails of both feet   2. Callus of foot   3. Type II diabetes mellitus with peripheral circulatory disorder (HCC)  Mycotic nails x10 were sharply debrided with sterile nail nippers and power debriding burr to decrease bulk and length.  Hyperkeratotic lesion right submet 3 was shaved with #312 blade.  Return in about 3 months (around 04/19/2024) for [facility].   Awanda CHARM [place], DPM, FACFAS [facility]     [address] [place], [place] [address]                Office [phone]  Fax [phone]

## 2024-04-17 ENCOUNTER — Ambulatory Visit: Admitting: Podiatry

## 2024-05-23 ENCOUNTER — Ambulatory Visit: Admitting: Internal Medicine

## 2025-01-03 ENCOUNTER — Inpatient Hospital Stay: Admitting: Hematology and Oncology
